# Patient Record
Sex: Female | Born: 1960 | Race: White | Hispanic: No | Smoking: Current some day smoker
Health system: Southern US, Community
[De-identification: ages and names within clinical notes are randomized; demographics above are authoritative.]

## PROBLEM LIST (undated history)

## (undated) DIAGNOSIS — E0590 Thyrotoxicosis, unspecified without thyrotoxic crisis or storm: Secondary | ICD-10-CM

## (undated) DIAGNOSIS — I341 Nonrheumatic mitral (valve) prolapse: Secondary | ICD-10-CM

## (undated) DIAGNOSIS — Z8489 Family history of other specified conditions: Secondary | ICD-10-CM

## (undated) DIAGNOSIS — K759 Inflammatory liver disease, unspecified: Secondary | ICD-10-CM

## (undated) DIAGNOSIS — F909 Attention-deficit hyperactivity disorder, unspecified type: Secondary | ICD-10-CM

## (undated) DIAGNOSIS — E0500 Thyrotoxicosis with diffuse goiter without thyrotoxic crisis or storm: Secondary | ICD-10-CM

## (undated) DIAGNOSIS — K219 Gastro-esophageal reflux disease without esophagitis: Secondary | ICD-10-CM

## (undated) DIAGNOSIS — C801 Malignant (primary) neoplasm, unspecified: Secondary | ICD-10-CM

## (undated) DIAGNOSIS — M1990 Unspecified osteoarthritis, unspecified site: Secondary | ICD-10-CM

## (undated) DIAGNOSIS — H5789 Other specified disorders of eye and adnexa: Secondary | ICD-10-CM

## (undated) DIAGNOSIS — M778 Other enthesopathies, not elsewhere classified: Secondary | ICD-10-CM

## (undated) DIAGNOSIS — F419 Anxiety disorder, unspecified: Secondary | ICD-10-CM

## (undated) DIAGNOSIS — G35 Multiple sclerosis: Secondary | ICD-10-CM

## (undated) DIAGNOSIS — R519 Headache, unspecified: Secondary | ICD-10-CM

## (undated) DIAGNOSIS — R51 Headache: Secondary | ICD-10-CM

## (undated) DIAGNOSIS — N879 Dysplasia of cervix uteri, unspecified: Secondary | ICD-10-CM

## (undated) DIAGNOSIS — J189 Pneumonia, unspecified organism: Secondary | ICD-10-CM

## (undated) HISTORY — DX: Other enthesopathies, not elsewhere classified: M77.8

## (undated) HISTORY — PX: TUBAL LIGATION: SHX77

## (undated) HISTORY — PX: BACK SURGERY: SHX140

## (undated) HISTORY — DX: Nonrheumatic mitral (valve) prolapse: I34.1

## (undated) HISTORY — PX: OTHER SURGICAL HISTORY: SHX169

## (undated) HISTORY — DX: Dysplasia of cervix uteri, unspecified: N87.9

## (undated) HISTORY — DX: Unspecified osteoarthritis, unspecified site: M19.90

## (undated) HISTORY — DX: Thyrotoxicosis with diffuse goiter without thyrotoxic crisis or storm: E05.00

## (undated) HISTORY — DX: Multiple sclerosis: G35

## (undated) HISTORY — DX: Attention-deficit hyperactivity disorder, unspecified type: F90.9

## (undated) HISTORY — DX: Gastro-esophageal reflux disease without esophagitis: K21.9

## (undated) HISTORY — DX: Other specified disorders of eye and adnexa: H57.89

---

## 1997-10-15 ENCOUNTER — Ambulatory Visit: Admission: RE | Admit: 1997-10-15 | Discharge: 1997-10-15 | Payer: Self-pay | Admitting: Obstetrics and Gynecology

## 1998-10-03 ENCOUNTER — Other Ambulatory Visit: Admission: RE | Admit: 1998-10-03 | Discharge: 1998-10-03 | Payer: Self-pay | Admitting: Obstetrics and Gynecology

## 1999-06-02 ENCOUNTER — Ambulatory Visit (HOSPITAL_COMMUNITY): Admission: RE | Admit: 1999-06-02 | Discharge: 1999-06-02 | Payer: Self-pay | Admitting: Neurology

## 1999-09-20 ENCOUNTER — Ambulatory Visit (HOSPITAL_COMMUNITY): Admission: RE | Admit: 1999-09-20 | Discharge: 1999-09-20 | Payer: Self-pay | Admitting: Neurology

## 1999-12-07 ENCOUNTER — Other Ambulatory Visit: Admission: RE | Admit: 1999-12-07 | Discharge: 1999-12-07 | Payer: Self-pay | Admitting: Obstetrics and Gynecology

## 2000-08-08 ENCOUNTER — Observation Stay (HOSPITAL_COMMUNITY): Admission: RE | Admit: 2000-08-08 | Discharge: 2000-08-09 | Payer: Self-pay | Admitting: Obstetrics and Gynecology

## 2000-08-08 ENCOUNTER — Encounter (INDEPENDENT_AMBULATORY_CARE_PROVIDER_SITE_OTHER): Payer: Self-pay | Admitting: *Deleted

## 2000-12-31 ENCOUNTER — Encounter: Payer: Self-pay | Admitting: Neurology

## 2000-12-31 ENCOUNTER — Encounter: Admission: RE | Admit: 2000-12-31 | Discharge: 2000-12-31 | Payer: Self-pay | Admitting: Neurology

## 2001-01-03 ENCOUNTER — Encounter: Admission: RE | Admit: 2001-01-03 | Discharge: 2001-01-03 | Payer: Self-pay | Admitting: Obstetrics and Gynecology

## 2001-01-03 ENCOUNTER — Encounter: Payer: Self-pay | Admitting: Obstetrics and Gynecology

## 2001-01-13 ENCOUNTER — Emergency Department (HOSPITAL_COMMUNITY): Admission: EM | Admit: 2001-01-13 | Discharge: 2001-01-13 | Payer: Self-pay | Admitting: Emergency Medicine

## 2001-08-27 ENCOUNTER — Other Ambulatory Visit: Admission: RE | Admit: 2001-08-27 | Discharge: 2001-08-27 | Payer: Self-pay | Admitting: Obstetrics and Gynecology

## 2001-10-23 ENCOUNTER — Ambulatory Visit (HOSPITAL_COMMUNITY): Admission: RE | Admit: 2001-10-23 | Discharge: 2001-10-23 | Payer: Self-pay | Admitting: Neurosurgery

## 2001-10-23 ENCOUNTER — Encounter: Payer: Self-pay | Admitting: Neurosurgery

## 2002-04-03 ENCOUNTER — Encounter: Payer: Self-pay | Admitting: Obstetrics and Gynecology

## 2002-04-03 ENCOUNTER — Encounter: Admission: RE | Admit: 2002-04-03 | Discharge: 2002-04-03 | Payer: Self-pay | Admitting: Obstetrics and Gynecology

## 2002-05-21 HISTORY — PX: ABDOMINAL HYSTERECTOMY: SHX81

## 2002-10-05 ENCOUNTER — Other Ambulatory Visit: Admission: RE | Admit: 2002-10-05 | Discharge: 2002-10-05 | Payer: Self-pay | Admitting: Obstetrics and Gynecology

## 2003-06-11 ENCOUNTER — Ambulatory Visit (HOSPITAL_COMMUNITY): Admission: RE | Admit: 2003-06-11 | Discharge: 2003-06-11 | Payer: Self-pay | Admitting: Internal Medicine

## 2003-06-18 ENCOUNTER — Ambulatory Visit (HOSPITAL_COMMUNITY): Admission: RE | Admit: 2003-06-18 | Discharge: 2003-06-18 | Payer: Self-pay | Admitting: Internal Medicine

## 2003-12-16 ENCOUNTER — Other Ambulatory Visit: Admission: RE | Admit: 2003-12-16 | Discharge: 2003-12-16 | Payer: Self-pay | Admitting: Obstetrics and Gynecology

## 2004-01-06 ENCOUNTER — Encounter: Admission: RE | Admit: 2004-01-06 | Discharge: 2004-01-06 | Payer: Self-pay | Admitting: Obstetrics and Gynecology

## 2004-02-24 ENCOUNTER — Ambulatory Visit (HOSPITAL_COMMUNITY): Admission: RE | Admit: 2004-02-24 | Discharge: 2004-02-24 | Payer: Self-pay | Admitting: Neurosurgery

## 2004-08-31 ENCOUNTER — Encounter (INDEPENDENT_AMBULATORY_CARE_PROVIDER_SITE_OTHER): Payer: Self-pay | Admitting: *Deleted

## 2004-08-31 ENCOUNTER — Ambulatory Visit (HOSPITAL_COMMUNITY): Admission: RE | Admit: 2004-08-31 | Discharge: 2004-08-31 | Payer: Self-pay | Admitting: Cardiology

## 2005-01-17 ENCOUNTER — Ambulatory Visit (HOSPITAL_COMMUNITY): Payer: Self-pay | Admitting: Psychiatry

## 2005-01-31 ENCOUNTER — Ambulatory Visit (HOSPITAL_COMMUNITY): Payer: Self-pay | Admitting: Psychiatry

## 2005-02-28 ENCOUNTER — Ambulatory Visit (HOSPITAL_COMMUNITY): Payer: Self-pay | Admitting: Psychiatry

## 2005-03-09 ENCOUNTER — Other Ambulatory Visit: Admission: RE | Admit: 2005-03-09 | Discharge: 2005-03-09 | Payer: Self-pay | Admitting: Obstetrics and Gynecology

## 2005-05-30 ENCOUNTER — Ambulatory Visit (HOSPITAL_COMMUNITY): Payer: Self-pay | Admitting: Psychiatry

## 2005-08-14 ENCOUNTER — Ambulatory Visit (HOSPITAL_COMMUNITY): Admission: RE | Admit: 2005-08-14 | Discharge: 2005-08-14 | Payer: Self-pay | Admitting: Neurology

## 2005-08-16 ENCOUNTER — Ambulatory Visit (HOSPITAL_COMMUNITY): Admission: RE | Admit: 2005-08-16 | Discharge: 2005-08-16 | Payer: Self-pay | Admitting: Neurology

## 2005-08-29 ENCOUNTER — Ambulatory Visit (HOSPITAL_COMMUNITY): Payer: Self-pay | Admitting: Psychiatry

## 2005-09-07 ENCOUNTER — Encounter: Admission: RE | Admit: 2005-09-07 | Discharge: 2005-09-07 | Payer: Self-pay | Admitting: Obstetrics and Gynecology

## 2005-11-09 ENCOUNTER — Encounter: Admission: RE | Admit: 2005-11-09 | Discharge: 2005-11-09 | Payer: Self-pay | Admitting: Neurosurgery

## 2005-11-23 ENCOUNTER — Encounter: Admission: RE | Admit: 2005-11-23 | Discharge: 2005-11-23 | Payer: Self-pay | Admitting: Neurosurgery

## 2005-11-27 ENCOUNTER — Inpatient Hospital Stay (HOSPITAL_COMMUNITY): Admission: EM | Admit: 2005-11-27 | Discharge: 2005-11-28 | Payer: Self-pay | Admitting: Emergency Medicine

## 2005-12-19 ENCOUNTER — Ambulatory Visit (HOSPITAL_COMMUNITY): Payer: Self-pay | Admitting: Psychiatry

## 2006-02-12 ENCOUNTER — Encounter: Admission: RE | Admit: 2006-02-12 | Discharge: 2006-03-06 | Payer: Self-pay | Admitting: Neurosurgery

## 2006-04-04 ENCOUNTER — Ambulatory Visit (HOSPITAL_COMMUNITY): Payer: Self-pay | Admitting: Psychiatry

## 2006-07-11 ENCOUNTER — Ambulatory Visit (HOSPITAL_COMMUNITY): Payer: Self-pay | Admitting: Psychiatry

## 2006-07-25 ENCOUNTER — Ambulatory Visit (HOSPITAL_COMMUNITY): Admission: RE | Admit: 2006-07-25 | Discharge: 2006-07-25 | Payer: Self-pay | Admitting: Cardiology

## 2006-07-31 ENCOUNTER — Ambulatory Visit (HOSPITAL_COMMUNITY): Admission: RE | Admit: 2006-07-31 | Discharge: 2006-07-31 | Payer: Self-pay | Admitting: Gastroenterology

## 2006-10-07 ENCOUNTER — Ambulatory Visit (HOSPITAL_COMMUNITY): Payer: Self-pay | Admitting: Psychiatry

## 2006-11-28 ENCOUNTER — Encounter: Admission: RE | Admit: 2006-11-28 | Discharge: 2006-11-28 | Payer: Self-pay | Admitting: Obstetrics and Gynecology

## 2007-01-24 ENCOUNTER — Ambulatory Visit (HOSPITAL_COMMUNITY): Payer: Self-pay | Admitting: Psychiatry

## 2007-06-19 ENCOUNTER — Ambulatory Visit (HOSPITAL_COMMUNITY): Payer: Self-pay | Admitting: Psychiatry

## 2007-09-12 ENCOUNTER — Ambulatory Visit (HOSPITAL_COMMUNITY): Payer: Self-pay | Admitting: Psychiatry

## 2007-10-06 ENCOUNTER — Emergency Department (HOSPITAL_COMMUNITY): Admission: EM | Admit: 2007-10-06 | Discharge: 2007-10-06 | Payer: Self-pay | Admitting: Emergency Medicine

## 2008-01-16 ENCOUNTER — Ambulatory Visit (HOSPITAL_COMMUNITY): Payer: Self-pay | Admitting: Psychiatry

## 2008-04-10 ENCOUNTER — Emergency Department (HOSPITAL_COMMUNITY): Admission: EM | Admit: 2008-04-10 | Discharge: 2008-04-10 | Payer: Self-pay | Admitting: Emergency Medicine

## 2008-04-10 ENCOUNTER — Ambulatory Visit (HOSPITAL_COMMUNITY): Admission: RE | Admit: 2008-04-10 | Discharge: 2008-04-10 | Payer: Self-pay | Admitting: Emergency Medicine

## 2008-04-19 ENCOUNTER — Emergency Department (HOSPITAL_COMMUNITY): Admission: EM | Admit: 2008-04-19 | Discharge: 2008-04-19 | Payer: Self-pay | Admitting: Emergency Medicine

## 2008-05-28 ENCOUNTER — Ambulatory Visit (HOSPITAL_COMMUNITY): Payer: Self-pay | Admitting: Psychiatry

## 2008-06-28 ENCOUNTER — Encounter (INDEPENDENT_AMBULATORY_CARE_PROVIDER_SITE_OTHER): Payer: Self-pay | Admitting: Gastroenterology

## 2008-06-28 ENCOUNTER — Ambulatory Visit (HOSPITAL_COMMUNITY): Admission: RE | Admit: 2008-06-28 | Discharge: 2008-06-28 | Payer: Self-pay | Admitting: Gastroenterology

## 2008-07-15 ENCOUNTER — Encounter: Admission: RE | Admit: 2008-07-15 | Discharge: 2008-07-15 | Payer: Self-pay | Admitting: Obstetrics and Gynecology

## 2008-07-22 ENCOUNTER — Ambulatory Visit (HOSPITAL_COMMUNITY): Admission: RE | Admit: 2008-07-22 | Discharge: 2008-07-22 | Payer: Self-pay | Admitting: Neurology

## 2008-09-07 ENCOUNTER — Ambulatory Visit (HOSPITAL_COMMUNITY): Payer: Self-pay | Admitting: Psychiatry

## 2009-01-28 ENCOUNTER — Ambulatory Visit (HOSPITAL_COMMUNITY): Payer: Self-pay | Admitting: Psychiatry

## 2009-07-01 ENCOUNTER — Ambulatory Visit (HOSPITAL_COMMUNITY): Payer: Self-pay | Admitting: Psychiatry

## 2010-01-27 ENCOUNTER — Ambulatory Visit (HOSPITAL_COMMUNITY): Payer: Self-pay | Admitting: Psychiatry

## 2010-04-21 ENCOUNTER — Ambulatory Visit (HOSPITAL_COMMUNITY): Payer: Self-pay | Admitting: Psychiatry

## 2010-04-28 ENCOUNTER — Ambulatory Visit (HOSPITAL_COMMUNITY): Payer: Self-pay | Admitting: Psychiatry

## 2010-06-10 ENCOUNTER — Encounter: Payer: Self-pay | Admitting: Neurosurgery

## 2010-06-11 ENCOUNTER — Encounter: Payer: Self-pay | Admitting: Neurology

## 2010-06-11 ENCOUNTER — Encounter: Payer: Self-pay | Admitting: Neurosurgery

## 2010-07-21 ENCOUNTER — Encounter (HOSPITAL_COMMUNITY): Payer: Commercial Managed Care - PPO | Admitting: Physician Assistant

## 2010-07-21 DIAGNOSIS — F988 Other specified behavioral and emotional disorders with onset usually occurring in childhood and adolescence: Secondary | ICD-10-CM

## 2010-10-03 NOTE — Op Note (Signed)
NAMEANYELY, CUNNING                ACCOUNT NO.:  [id number]   MEDICAL RECORD NO.:  [id number]          PATIENT TYPE:  AMB   LOCATION:  ENDO                         FACILITY:  [facility]   PHYSICIAN:  Petra Kuba, M.D.    DATE OF BIRTH:  [birth year]   DATE OF PROCEDURE:  06/28/2008  DATE OF DISCHARGE:                               OPERATIVE REPORT   PROCEDURE:  Esophagogastroduodenoscopy with biopsy.   INDICATION:  Midepigastric abdominal pain, some atypical chest pain and  nausea.  Consent was signed after risks, benefits, methods, and options  thoroughly discussed multiple times in the past.   MEDICINES USED:  1. Fentanyl 75 mcg.  2. Versed 7 mg.   PROCEDURE:  The video endoscope was inserted by direct vision.  The  esophagus was normal.  She did have a tiny hiatal hernia.  Scope passed  into the stomach and advanced through a normal pylorus, into a normal  duodenal bulb and around the C-loop to a normal second and probably  third and fourth part of the duodenum.  The scope was withdrawn back to  the bulb and a good look there ruled out abnormalities in that location.  Scope was withdrawn back to the stomach.  She did have a mild amount of  antritis.  Two biopsies were obtained and 2 of the proximal stomach to  rule out Helicobacter.  On retroflexion, the cardia, fundus, angularis,  lesser and greater curves were all normal except for a tiny polyp in the  cardia which was cold biopsied x2.  Straight visualization of the  stomach did not reveal any additional findings.  Air was suctioned.  The  scope was slowly withdrawn.  Again, a good look at the esophagus was  normal.  Quick look at the vocal cords were normal.  Scope was removed.  The patient tolerated the procedure well.  There was no obvious  immediate complication.   ENDOSCOPIC DIAGNOSES:  1. Tiny hiatal hernia without signs of esophagitis.  2. Minimal antritis, status post biopsy.  3. Tiny proximal stomach polyp, status  post biopsy and proximal      stomach biopsy to rule out Helicobacter.  4. Otherwise within normal limits to the third or fourth part of the      duodenum.   PLAN:  Continue Protonix, seems to be helping.  Await pathology.  Follow  up p.r.n. or in 1 month to recheck symptoms and make sure no further  workup plans are needed like possibly rechecking her gallbladder, CCK,  PIPIDA, etc.           ______________________________  Petra Kuba, M.D.     MEM/MEDQ  D:  06/28/2008  T:  06/29/2008  Job:  [id number]

## 2010-10-06 NOTE — Op Note (Signed)
[facility]  Patient:    [name], [name]                       MRN: [id number] Proc. Date: 08/08/00 Adm. Date:  [id number] Attending:  Cordelia Pen Ii                           Operative Report  PREOPERATIVE DIAGNOSIS:       Menorrhagia.  POSTOPERATIVE DIAGNOSIS:      Menorrhagia.  OPERATION:                    Total vaginal hysterectomy.  SURGEON:                      Guy Sandifer. Arleta Creek, M.D.  ASSISTANT:                    Raynald Kemp, M.D.  ANESTHESIA:                   General endotracheal anesthesia.  ESTIMATED BLOOD LOSS:         100 cc.  INDICATIONS:                  This patient is a 50 year old married white female with menorrhagia.  Details are dictated in the history and physical. Total vaginal hysterectomy is discussed with the patient.  Removal of ovaries unless distinctly abnormal was discussed.  Potential risks and complications have been discussed including, but not limited to, infection, bowel, bladder, or ureteral damage, bleeding requiring transfusion of blood products, and possible transfusion with reaction to HIV and hepatitis acquisition, DVT, PE, pneumonia, fistula formation, laparotomy, and postoperative dyspareunia.  All questions were answered and consent is signed on the chart.  DESCRIPTION OF PROCEDURE:     The patient is taken to the operating room, placed in the dorsal lithotomy position where general anesthesia is induced via endotracheal intubation.  She was then placed in the dorsal lithotomy position where she is prepped, bladder straight catheterized, and she is draped in a sterile fashion.  Weighted speculum was placed.  The posterior cul-de-sac was entered sharply without difficulty.  The cervix is circumscribed with a scalpel and the vaginal mucosa is advanced bluntly and sharply.  Uterosacral ligaments then taken down bilaterally.  This was done using the LigaSure instrument.  The bladder  pillars were then taken down bilaterally.  The anterior cul-de-sac is then entered without difficulty.  The cardinal ligaments were then taken bilaterally.  The uterine vessels were taken bilaterally using a double burn technique with a LigaSure.  Two bites of uterine vessels are taken bilaterally.  Fundus is then delivered posteriorly.  The proximal ligaments were then taken again using a double burn technique with the LigaSure. Specimen is delivered.  Inspection reveals some bleeding from the base of the proximal ligaments on the left and the right side.  These are visualized and ligated with 0 Monocryl suture.  The uterosacral ligaments are then plicated in the vagina bilaterally using 0 Monocryl and then ligated in the midline with a single 0 Monocryl suture.  Cuff was then closed with a figure-of-eight Monocryl sutures.  Foley catheter was placed in the bladder and clear urine is noted.  All counts are correct.  The patient is awakened and taken to the recovery room in stable condition. DD:  08/08/00 TD:  08/08/00 Job: [id number]

## 2010-10-06 NOTE — Op Note (Signed)
NAMEJANAIA, [name]                ACCOUNT NO.:  [id number]   MEDICAL RECORD NO.:  [id number]          PATIENT TYPE:  INP   LOCATION:  6729                         FACILITY:  [facility]   PHYSICIAN:  Clydene Fake, M.D.  DATE OF BIRTH:  06-26-1960   DATE OF PROCEDURE:  11/27/2005  DATE OF DISCHARGE:  11/28/2005                                 OPERATIVE REPORT   PREOPERATIVE DIAGNOSIS:  Herniated nucleus pulposus right L5-S1,  spondylosis.   POSTOPERATIVE DIAGNOSIS:  Herniated nucleus pulposus right L5-S1,  spondylosis.   PROCEDURE:  Right L5-S1 semi-hemilaminectomy and discectomy, microscopic  surgical procedure.   SURGEON:  Clydene Fake, M.D.   ASSISTANT:  Hilda Lias, M.D.   ANESTHESIA:  General endotracheal anesthesia.   ESTIMATED BLOOD LOSS:  Minimal.   BLOOD GIVEN:  None.   DRAINS:  None.   COMPLICATIONS:  None.   REASON FOR PROCEDURE:  The patient is a 50 year old woman who has had some  intermittent back pain over the years, presented with 3-1/2 weeks of severe  right leg pain with some numbness, tingling, epidural injections have  helped, but did not last and today pain became even more severe.  The  patient presented to the emergency room where a new MRI was done showing an  extruded, very large disc herniation, right sided L5-S1 with extrusion  causing compression of the S1 root.  The patient was brought in for  diskectomy.   PROCEDURE IN DETAIL:  The patient was brought into the operating room and  general anesthesia induced.  The patient was placed in the prone position on  the Wilson frame with all pressure points padded.  The patient was prepped  and draped in a sterile fashion.  Site of the incision  was then injected  with 10 cc of 1% lidocaine with epinephrine.  Incision was then made in the  midline lower lumbar spine, incision taken down to the fascia with  hemostasis obtained with Bovie cauterization.  The fascia was incised and  subperiosteal  dissection was done over the two spinous processes.  A marker  was placed.  X-rays were obtained assuring [place] that this was the 4-5 space.  We dissected inferiorly one level with marker in the interspace.  AP and  lateral  x-rays showed this is the 5-1 interspace.  Microscope was brought  in for microdissection.  At this point a self-retaining retractor was  placed.  A semi-hemilaminectomy was performed with high speed drill and  Kerrison punches.  Ligamentum flavum was removed with Kerrison punches.  The  foraminotomy was done over the S1 root and explored the epidural space and  found a very large disc protrusion.  The posterior aspect of this was an  annular tear and extruded fragment.  We removed the free fragments of disc  and decompressed the nerve root.  There  was still significant disc bulge  and we incised the disc space and continued with discectomy with pituitary  rongeurs and curets.  We used Epstein curets to push the disc material back  down in the disc space and  then removed it.  When we were finished we  irrigated and had central and lateral decompression of the canal and very  good decompression of the S1 root.  Hemostasis was obtained with bipolar  cauterization, Gelfoam with thrombin.  Gelfoam was irrigated out from the  area with antibiotics solution  maintaining good hemostasis.  Again a good  decompression of both the L5 and S1 roots and the central canal.  Retractors  were removed.  Fascia was closed with 0 Vicryl interrupted sutures,  subcutaneous tissue closed with 2-0 and 3-0 Vicryl interrupted sutures.  Skin closed with Benzoin and Steri-Strips.  Dressing placed.  The patient  was placed back in the supine position, reversed from anesthesia and  transferred to the recovery room in stable condition.           ______________________________  Clydene Fake, M.D.     JRH/MEDQ  D:  11/27/2005  T:  11/28/2005  Job:  [id number]

## 2010-10-06 NOTE — Discharge Summary (Signed)
Danielle, [name]                          ACCOUNT NO.:  [id number]   MEDICAL RECORD NO.:  [id number]                   PATIENT TYPE:  OIB   LOCATION:  3702                                 FACILITY:  [facility]   PHYSICIAN:  Duke Salvia, M.D.               DATE OF BIRTH:  06-Jan-1961   DATE OF ADMISSION:  06/18/2003  DATE OF DISCHARGE:  06/18/2003                                 DISCHARGE SUMMARY   PRIMARY DIAGNOSIS:  Supraventricular tachycardia.   HISTORY OF PRESENT ILLNESS:  This is a 50 year old female with a 10 year  history of repeated abrupt onset-offset of tachy-palpitations associated  with shortness of breath, fatigue, chest discomfort.  She does not have  lightheadedness, presyncope, or syncope.  Started 10 years ago.  They  occurred a couple times a month, lasting 10-15 minutes.  Over the last 6-8  months, there has been a marked increase in frequency, so they are happening  daily, lasting hours and resulting in profound postepisode fatigue.  She was  started on atenolol in conjunction with digoxin, and this has been  associated with significant improvement, although not abolition of her  episodes.  Patient was admitted, and patient's secondary diagnoses are  history of mitral valve prolapse, SVT, and migraine headaches.  She was  admitted and underwent a flow pathway modification of AV nodal reentrant  tachycardia.  She tolerated the procedure well, had no immediate postop  complications, and was later discharged that evening to home in stable  condition.   DISCHARGE MEDICATIONS:  1. Tenormin 25 mg daily.  2. Prozac 20 daily.  3. Betaseron as before.  4. She was to stop her digoxin.  5. Coated aspirin 325 mg daily x 6 weeks.  6. Antibiotic prophylaxis for next 3 months.  7. Tylenol 1-2 tabs q.4-6h. p.r.n. pain.   No heavy lifting or strenuous activity for four days, no driving for two  days.  Low-fat, low-salt, low-cholesterol diet.  She was to call if she  developed any lump or drainage in her groin.  She was to follow with [name], at [facility], March 2, at 2:30 p.m.      [name], C.R.N.P. [facility]                 Duke Salvia, M.D.    DS/MEDQ  D:  06/18/2003  T:  06/19/2003  Job:  [id number]   cc:   Duke Salvia, M.D.   Thereasa Solo. Little, M.D.  [address]  [address]  [place]  [place] [address]  Fax: [phone]

## 2010-10-06 NOTE — Discharge Summary (Signed)
[facility]  Patient:    [name], [name]                       MRN: [id number] Adm. Date:  [id number] Disc. Date: [id number] Attending:  Cordelia Pen Ii                           Discharge Summary  ADMISSION DIAGNOSIS:          Menorrhagia.  DISCHARGE DIAGNOSIS:          Menorrhagia.  PROCEDURE:                    On August 08, 2000, total vaginal hysterectomy.  REASON FOR ADMISSION:         The patient is a 50 year old married white female, G3, P2, AB 1, status post tubal ligation with heavy vaginal bleeding. Details dictated in History and Physical.  Admitted for surgical therapy.  HOSPITAL COURSE:              The patient was admitted for the above procedure which passed without complication.  Estimated blood loss was 100 cc.  On the day of discharge, the patient is passing flatus, tolerating regular diet, ambulating well with good pain relief.  Vital signs are stable.  She remains afebrile.  Abdomen is soft with good bowel sounds.  Extremities nontender.  LABORATORY DATA:              Hemoglobin 11.2, white count 10.1, platelet count 242,200.  Pathology pending.  CONDITION ON DISCHARGE:       Good.  DISCHARGE INSTRUCTIONS:       Diet:  Regular as tolerated.  Activity:  No lifting, no operation of automobiles, no vaginal entry.  DISCHARGE FOLLOWUP:           She is to follow up in the office in two weeks. She is to call for problems including, but not limited to, temperature over 100 degrees, increasing pain, heavy vaginal bleeding, or persistent nausea/vomiting.  DISCHARGE MEDICATIONS:        1. Tylox #30 one to two p.o. q.6h. p.r.n.                               2. Ibuprofen 600 mg p.o. q.6h. p.r.n.                               3. Multivitamin q.d. DD:  08/09/00 TD:  08/11/00 Job: [id number]

## 2010-10-06 NOTE — Op Note (Signed)
[name], [name]                          ACCOUNT NO.:  [id number]   MEDICAL RECORD NO.:  [id number]                   PATIENT TYPE:  OIB   LOCATION:  3702                                 FACILITY:  [facility]   PHYSICIAN:  Duke Salvia, M.D.               DATE OF BIRTH:  08-Sep-1960   DATE OF PROCEDURE:  06/18/2003  DATE OF DISCHARGE:  06/18/2003                                 OPERATIVE REPORT   PREOPERATIVE DIAGNOSIS:  Ventricular tachycardia.   POSTOPERATIVE DIAGNOSIS:  AV node re-entry tachycardia.   OPERATION PERFORMED:  Basic electrophysiologic study with isoproterenol  infusion and arrhythmia mapping with radio frequency catheter ablation.   SURGEON:  Duke Salvia, M.D.   DESCRIPTION OF PROCEDURE:  Following the obtaining of informed consent, the  patient was brought to the electrophysiology laboratory and placed on the  fluoroscopic table in supine position.  After routine prep and drape,  cardiac catheterization was performed with local anesthesia and conscious  sedation.  Noninvasive blood pressure monitoring and transcutaneous oxygen  saturation monitoring and end tidal CO2 monitoring was performed  continuously throughout the procedure.  Following the procedure, the  catheters were removed, hemostasis was obtained.  The patient was  transferred to the floor in stable condition.   CATHETERS:  A 5 French quadripolar catheter was inserted via left femoral  vein to the high right atrium.  A 5 French quadripolar catheter was inserted  via the left femoral vein to the AV junction to measure His bundle  electrogram.  A 5 French quadripolar catheter was inserted via the left  femoral vein to the right ventricular apex.  A 6 French octapolar catheter  was inserted via the right femoral vein to the coronary sinus.  A 7 French 4  mm deflectable tip catheter was inserted via the right femoral vein to  mapping sites in the posterior septal space.   Surface leads 1, aVF,  and V1 were monitored continuously throughout the  procedure.  Following insertion of the catheters, stimulation protocol  included incremental atrial pacing, incremental ventricular pacing, single  and double ventricular extrastimuli at pace cycle length of 600 and 400  msec.  Single ventricular extrastimuli.   RESULTS:  a.  Surface electrocardiogram basic measurements.  Rhythm sinus  initial and sinus final.  Cycle length is 969 msec initial and 576 msec  final (on isoproterenol.  PR interval is 137 msec initial and 127 msec  final.  QRS duration is 78 msec initial and 95 msec final.  QT interval is  387 msec initial and 355 msec final.  Q wave duration is 77 msec initial and  95 msec final.  Bundle branch block is absent and absent.  Pre-excitation is  absent and absent.  AH interval is 77 msec initial and 74 msec final.  HV  interval is 56 msec initial and 39 msec final.  b.  AV nodal  function.  The AV nodal Wenckebach cycle length was 450 msec  with VA Wenckebach less than 450 msec.  AV nodal conduction pre ablation was  discontinuous with echo beats and postablation was associated with isolated  echo beats.  c.  Accessory pathway function.  No evidence of an accessory pathway was  identified.  d.  Arrhythmias induced.  AV nodal re-entry was reproducibly induced  initially during catheter insertion and then with ventricular pacing at 450  and single atrial  extrastimuli at a pace cycle length at 600 msec.  It was  terminated reproducibly with ventricular pacing.   Characteristics of the tachycardia included the earliest atrial electrogram  was in the His bundle measurement, no atrial pre-excitation was seen with  ventricular stimulation administered during His bundle refractoriness.   Typical intervals during tachycardia included an AH interval of 443 msec, an  HA interval of 39 msec, a VA time of -4 msec and a V to high right atrial  time of 48 msec.   FLUOROSCOPY TIME:  A  total of six minutes of fluoroscopy time was utilized  at 7.5 frames per second.   RADIO FREQUENCY ENERGY:  A total of 41 seconds of radio frequency energy was  applied in two applications, the second of which was associated with  junctional rhythm.  Following the second application, tachycardia was no  longer inducible in the absence or in the presence of isoproterenol.   IMPRESSION:  1. Normal sinus function.  2. Normal atrial function.  3. Dual AV nodal physiology with inducible slow:fast AV nodal re-entry     tachycardia.  RF applied to the posterior septal space and presumed slow     pathway potential has eliminated inducibility of the patient's     tachycardia.  4. Normal His-Purkinje system function.  5. No accessory pathway.  6. Normal ventricular response to programmed stimulation.   SUMMARY:  In conclusion, results of electrophysiologic testing demonstrated  slow-fast AV nodal re-entry tachycardia as the mechanism underlying the  patient's clinical arrhythmia.  Radiofrequency modification of the slow  pathway rendered the patient noninducible.   RECOMMENDATIONS:  The patient was observed, was put on aspirin and  endocarditis prophylaxis for six weeks.   Thereafter, a suture was placed on the superolateral aspect of the pocket  and then moved across the device into the medial aspect of the pocket and  then tied with one knot laterally.  A Medtronic Reveal Plus 6191 loop  recorder was used.  Serial number [id number].  The pocket was copiously  irrigated with antibiotic containing saline solution.  The loop recorder was  secured.  The wound was washed, dried and a Benzoin and Steri-Strip dressing  was applied.  Sponge, needle and instrument counts were correct at the end  of the procedure according to staff.  The patient tolerated the procedure  without apparent complication.                                               Duke Salvia, M.D.    SCK/MEDQ  D:   08/10/2003  T:  08/12/2003  Job:  [id number]   cc:   Thereasa Solo. Little, M.D.  [address]  [address]  [place]  [place] [address]  Fax: [phone]

## 2010-10-06 NOTE — H&P (Signed)
[facility]  Patient:    [name], WIBLE                         MRN: [id number] Adm. Date:  08/08/00 Attending:  Guy Sandifer. Arleta Creek, M.D.                         History and Physical  CHIEF COMPLAINT:              Heavy vaginal bleeding.  HISTORY OF PRESENT ILLNESS:   This patient is a 50 year old married white female, G3, P2, AB 1, status post tubal ligation, who continues to have extremely heavy vaginal bleeding with her menses.  She is status post hysteroscopy, D&C in 1998, with negative findings at that time.  Daily birth control pills have also not been successful.  After a careful discussion of the options, she is being admitted for total vaginal hysterectomy.  One or both ovaries will be removed only if they are distinctly abnormal.  PAST MEDICAL HISTORY:         1. 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                               2. Mitral valve prolapse.                               3. History of situational depression.                               4. History of gastric ulcers.                               5. Multiple sclerosis.                               6. 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                               7. Status post broken kneecap 1988.  PAST SURGICAL HISTORY:        1. D&C 1998.                               2. Aspiration of benign breast cyst 1996.                               3. Laparoscopy with application of Filshie clips                                  and hysteroscopy, D&C in 1998.  4. Cyst removed from right forearm 1985.  MEDICATIONS:                  1. Betaseron every other day.                               2. Prozac 20 mg daily.                               3. Lanoxin 0.125 mg daily.                               4. Solu-Medrol, last dose several weeks ago.                               5. Lo/Ovral  daily.  ALLERGIES:                    KEFLEX leading to HIVES.  SOCIAL HISTORY:               Smokes five cigarettes a day.  Denies alcohol or drug abuse.  OBSTETRICAL HISTORY:          Vaginal delivery x 2.  REVIEW OF SYSTEMS:            Negative except as above.  PHYSICAL EXAMINATION:  VITAL SIGNS:                  Height 5 feet 7 inches.  HEENT:                        Without thyromegaly.  LUNGS:                        Clear to auscultation.  HEART:                        Regular rate and rhythm.  BACK:                         Without CVA tenderness.  BREASTS:                      Without masses or discharge.  ABDOMEN:                      Soft and nontender, without masses.  PELVIC:                       Vulva, vagina, cervix without lesion.  Uterus is six weeks in size, with first-degree prolapse.  Adnexa nontender, without masses.  EXTREMITIES:                  Grossly within normal limits.  NEUROLOGIC:                   Grossly within normal limits.  ASSESSMENT:                   Menorrhagia.  PLAN:                         Total vaginal hysterectomy. DD:  08/03/00  TD:  08/03/00 Job: [id number]

## 2010-10-19 ENCOUNTER — Encounter (HOSPITAL_COMMUNITY): Payer: Commercial Managed Care - PPO | Admitting: Physician Assistant

## 2010-10-19 DIAGNOSIS — F988 Other specified behavioral and emotional disorders with onset usually occurring in childhood and adolescence: Secondary | ICD-10-CM

## 2010-10-20 ENCOUNTER — Encounter (HOSPITAL_COMMUNITY): Payer: Commercial Managed Care - PPO | Admitting: Physician Assistant

## 2010-10-24 ENCOUNTER — Encounter (HOSPITAL_COMMUNITY): Payer: Commercial Managed Care - PPO | Admitting: Physician Assistant

## 2011-01-25 ENCOUNTER — Encounter (HOSPITAL_COMMUNITY): Payer: Commercial Managed Care - PPO | Admitting: Physician Assistant

## 2011-01-25 DIAGNOSIS — F988 Other specified behavioral and emotional disorders with onset usually occurring in childhood and adolescence: Secondary | ICD-10-CM

## 2011-02-20 LAB — POCT URINALYSIS DIP (DEVICE)
Glucose, UA: NEGATIVE mg/dL
Hgb urine dipstick: NEGATIVE
Ketones, ur: NEGATIVE mg/dL
Nitrite: NEGATIVE
Protein, ur: 30 mg/dL — AB
Specific Gravity, Urine: 1.02 (ref 1.005–1.030)
Urobilinogen, UA: 0.2 mg/dL (ref 0.0–1.0)
pH: 6 (ref 5.0–8.0)

## 2011-04-26 ENCOUNTER — Ambulatory Visit (HOSPITAL_COMMUNITY): Payer: Commercial Managed Care - PPO | Admitting: Physician Assistant

## 2011-04-26 DIAGNOSIS — F988 Other specified behavioral and emotional disorders with onset usually occurring in childhood and adolescence: Secondary | ICD-10-CM

## 2011-04-26 MED ORDER — LISDEXAMFETAMINE DIMESYLATE 40 MG PO CAPS: 40.0000 mg | ORAL_CAPSULE | ORAL | Status: DC

## 2011-04-30 ENCOUNTER — Other Ambulatory Visit (HOSPITAL_COMMUNITY): Payer: Self-pay | Admitting: *Deleted

## 2011-04-30 DIAGNOSIS — F902 Attention-deficit hyperactivity disorder, combined type: Secondary | ICD-10-CM

## 2011-04-30 MED ORDER — LISDEXAMFETAMINE DIMESYLATE 40 MG PO CAPS: 40.0000 mg | ORAL_CAPSULE | ORAL | Status: DC

## 2011-07-31 ENCOUNTER — Ambulatory Visit (HOSPITAL_COMMUNITY): Payer: Commercial Managed Care - PPO | Admitting: Physician Assistant

## 2011-08-08 ENCOUNTER — Other Ambulatory Visit: Payer: Self-pay | Admitting: Obstetrics and Gynecology

## 2011-08-29 ENCOUNTER — Other Ambulatory Visit (HOSPITAL_COMMUNITY): Payer: Self-pay | Admitting: *Deleted

## 2011-08-29 ENCOUNTER — Ambulatory Visit (INDEPENDENT_AMBULATORY_CARE_PROVIDER_SITE_OTHER): Payer: Commercial Managed Care - PPO | Admitting: Physician Assistant

## 2011-08-29 DIAGNOSIS — F902 Attention-deficit hyperactivity disorder, combined type: Secondary | ICD-10-CM

## 2011-08-29 DIAGNOSIS — G35 Multiple sclerosis: Secondary | ICD-10-CM

## 2011-08-29 DIAGNOSIS — F909 Attention-deficit hyperactivity disorder, unspecified type: Secondary | ICD-10-CM

## 2011-08-29 MED ORDER — LISDEXAMFETAMINE DIMESYLATE 40 MG PO CAPS: 40.0000 mg | ORAL_CAPSULE | ORAL | Status: DC

## 2011-08-29 MED ORDER — LISDEXAMFETAMINE DIMESYLATE 50 MG PO CAPS: 40.0000 mg | ORAL_CAPSULE | ORAL | Status: DC

## 2011-09-03 ENCOUNTER — Other Ambulatory Visit (HOSPITAL_COMMUNITY): Payer: Self-pay | Admitting: *Deleted

## 2011-09-03 NOTE — Telephone Encounter (Signed)
Prior authorization requested from pharmacy prior to filling [email or web address] to be faxed

## 2011-09-04 DIAGNOSIS — G35 Multiple sclerosis: Secondary | ICD-10-CM | POA: Insufficient documentation

## 2011-09-04 NOTE — Progress Notes (Signed)
   [facility] Follow-up Outpatient Visit  ZEFFIE BICKERT 02-03-1961  Date: 08/29/2011   Subjective: Harvin Hazel presents today to followup on medications prescribed for ADHD. She reports that her coworkers are asking her if she has taken her medications on occasions. She is uncertain, but feels that the Vyvanse may need to be increased. Otherwise she is doing well. She is sleeping and eating well. She denies any suicidal or homicidal ideation. She denies any auditory or visual hallucinations.  There were no vitals filed for this visit.  Mental Status Examination  Appearance: Well groomed and casually dressed Alert: Yes Attention: good  Cooperative: Yes Eye Contact: Good Speech: Clear and even Psychomotor Activity: Normal Memory/Concentration: Intact Oriented: person, place, time/date and situation Mood: Euthymic Affect: Appropriate Thought Processes and Associations: Linear Fund of Knowledge: Good Thought Content: Normal Insight: Good Judgement: Good  Diagnosis: ADHD, inattentive type  Treatment Plan: We will increase her Vyvanse to 50 mg daily and she will followup in 3 months. She is encouraged to call if necessary  Thera Basden, PA-C

## 2011-09-07 ENCOUNTER — Encounter (HOSPITAL_COMMUNITY): Payer: Self-pay | Admitting: *Deleted

## 2011-09-07 NOTE — Progress Notes (Signed)
Notified by [employer]: Vyvanse 50 mg authorized 09/06/11 to 09/05/12

## 2011-09-10 ENCOUNTER — Ambulatory Visit (INDEPENDENT_AMBULATORY_CARE_PROVIDER_SITE_OTHER): Payer: Commercial Managed Care - PPO | Admitting: Family Medicine

## 2011-09-10 ENCOUNTER — Encounter: Payer: Self-pay | Admitting: Family Medicine

## 2011-09-10 VITALS — BP 116/79 | HR 76 | Temp 97.9°F | Ht 67.0 in | Wt 183.5 lb

## 2011-09-10 DIAGNOSIS — Z0000 Encounter for general adult medical examination without abnormal findings: Secondary | ICD-10-CM | POA: Insufficient documentation

## 2011-09-10 DIAGNOSIS — F17200 Nicotine dependence, unspecified, uncomplicated: Secondary | ICD-10-CM

## 2011-09-10 DIAGNOSIS — G35 Multiple sclerosis: Secondary | ICD-10-CM

## 2011-09-10 DIAGNOSIS — Z720 Tobacco use: Secondary | ICD-10-CM | POA: Insufficient documentation

## 2011-09-10 NOTE — Assessment & Plan Note (Deleted)
Advised on health maintenance for MS, relative immunocompromise on medications.  Advised to speak to her neurologist about pneumovax.  Recommended sunscreen, skin check given signs of sun damage.

## 2011-09-10 NOTE — Assessment & Plan Note (Signed)
Counseled on cessation, offered counseling when she feels ready

## 2011-09-10 NOTE — Patient Instructions (Signed)
You will get records from your neurologist (fasting blood work) and last office Results of DEXA and mammogram Think about pneumonia vaccine and MS- ask your neurologist  Follow-up annually- sooner if needed.

## 2011-09-10 NOTE — Progress Notes (Signed)
  Subjective:    Patient ID: [name], female    DOB: November 12, 1960, 51 y.o.   MRN: [id number]  HPI Here to establish PCP   Patient Information Form: Screening and ROS  AUDIT-C Score: 2 Do you feel safe in relationships? yes PHQ-2:negative  Review of Symptoms  General:  Negative for nexplained weight loss, fever Skin: Negative for new or changing mole, sore that won't heal HEENT: Negative for trouble hearing, trouble seeing, ringing in ears, mouth sores, hoarseness, change in voice, dysphagia. CV:  Negative for chest pain, dyspnea, edema, palpitations Resp: Negative for cough, dyspnea, hemoptysis GI: Negative for nausea, vomiting, diarrhea, constipation, abdominal pain, melena, hematochezia. GU: Negative for dysuria, incontinence, urinary hesitance, hematuria, vaginal or penile discharge, polyuria, sexual difficulty, lumps in testicle or breasts MSK: Negative for muscle cramps or aches, joint pain or swelling Neuro: Negative for headaches, weakness, numbness, dizziness, passing out/fainting Psych: Negative for depression, anxiety, memory problems     Review of Systemssee HPI    Objective:   Physical Exam GEN: Alert & Oriented, No acute distress HEENT: [place]/AT. EOMI, PERRLA, no conjunctival injection or scleral icterus.  Bilateral tympanic membranes intact without erythema or effusion.  .  Nares without edema or rhinorrhea.  Oropharynx is without erythema or exudates.  No anterior or posterior cervical lymphadenopathy. CV:  Regular Rate & Rhythm, no murmur Respiratory:  Normal work of breathing, CTAB Abd:  + BS, soft, no tenderness to palpation Ext: no pre-tibial edema Skin: evidence of sun damage       Assessment & Plan:

## 2011-09-10 NOTE — Assessment & Plan Note (Signed)
vised on health maintenance for MS, relative immunocompromise on medications.  Advised to speak to her neurologist about pneumovax.  Recommended sunscreen, skin check given signs of sun damage.  Planning on colonoscopy with Dr. Ewing Schlein Had Pap, Mammo and DEXA this year with OBGYN Had TDAP in past 10 years Had fasting labs with neuro- will get records for me

## 2011-09-14 ENCOUNTER — Encounter: Payer: Self-pay | Admitting: Family Medicine

## 2011-12-05 ENCOUNTER — Ambulatory Visit (INDEPENDENT_AMBULATORY_CARE_PROVIDER_SITE_OTHER): Payer: Commercial Managed Care - PPO | Admitting: Physician Assistant

## 2011-12-05 DIAGNOSIS — F988 Other specified behavioral and emotional disorders with onset usually occurring in childhood and adolescence: Secondary | ICD-10-CM

## 2011-12-05 NOTE — Progress Notes (Signed)
   [facility] Behavioral Health Follow-up Outpatient Visit  [name] 1961/03/09  Date: 12/05/2011   Subjective: [name] presents today to followup on her treatment for ADHD. At her last visit we increased her Vyvanse to 50 mg because there were days when people questioned whether she had taken her medication or not. She reports that she has not seen a big difference since we increased her medication. She is willing to increase it more to see if any benefit can be seen. She denies any side effects to her current dose.  There were no vitals filed for this visit.  Mental Status Examination  Appearance: Well groomed and casually dressed Alert: Yes Attention: good  Cooperative: Yes Eye Contact: Good Speech: Clear and coherent Psychomotor Activity: Normal Memory/Concentration: Intact Oriented: person, place, time/date and situation Mood: Euthymic Affect: Appropriate Thought Processes and Associations: Linear Fund of Knowledge: Good Thought Content: Normal Insight: Good Judgement: Good  Diagnosis: ADHD, inattentive type  Treatment Plan: We will increase her Vyvanse to 60 mg and she will return for followup in 3 months.  Trystin Terhune, PA-C

## 2011-12-06 ENCOUNTER — Other Ambulatory Visit (HOSPITAL_COMMUNITY): Payer: Self-pay | Admitting: *Deleted

## 2011-12-06 MED ORDER — LISDEXAMFETAMINE DIMESYLATE 60 MG PO CAPS: 60.0000 mg | ORAL_CAPSULE | ORAL | Status: DC

## 2012-03-06 ENCOUNTER — Ambulatory Visit (INDEPENDENT_AMBULATORY_CARE_PROVIDER_SITE_OTHER): Payer: Commercial Managed Care - PPO | Admitting: Physician Assistant

## 2012-03-06 ENCOUNTER — Other Ambulatory Visit (HOSPITAL_COMMUNITY): Payer: Self-pay | Admitting: *Deleted

## 2012-03-06 DIAGNOSIS — F988 Other specified behavioral and emotional disorders with onset usually occurring in childhood and adolescence: Secondary | ICD-10-CM

## 2012-03-06 MED ORDER — ALPRAZOLAM 0.5 MG PO TABS: 0.5000 mg | ORAL_TABLET | Freq: Every evening | ORAL | Status: DC | PRN

## 2012-03-06 MED ORDER — LISDEXAMFETAMINE DIMESYLATE 60 MG PO CAPS: 60.0000 mg | ORAL_CAPSULE | ORAL | Status: DC

## 2012-03-10 DIAGNOSIS — F988 Other specified behavioral and emotional disorders with onset usually occurring in childhood and adolescence: Secondary | ICD-10-CM | POA: Insufficient documentation

## 2012-03-10 NOTE — Progress Notes (Signed)
   [facility] Behavioral Health Follow-up Outpatient Visit  [name] 1960-11-14  Date: 03/06/2012   Subjective: [name] presents today to followup on her treatment for ADHD. She reports that everything is going well with her. She does describe some conflicts in her personal life with the family of her boyfriend. She asked about advice or suggestions on how to handle that situation. Otherwise she reports that the Vyvanse continues to work well for her and she wants to continue it. She also asked for a refill on her Xanax, which she takes on an occasional basis. Overall, she reports her mood has been stable. She denies any sleep or appetite problems. She denies any suicidal or homicidal ideation. She denies any auditory or visual hallucinations.  There were no vitals filed for this visit.  Mental Status Examination  Appearance: Well groomed and casually dressed Alert: Yes Attention: good  Cooperative: Yes Eye Contact: Good Speech: Clear and coherent Psychomotor Activity: Normal Memory/Concentration: Intact Oriented: person, place, time/date and situation Mood: Euthymic Affect: Congruent Thought Processes and Associations: Linear Fund of Knowledge: Good Thought Content: Normal Insight: Good Judgement: Good  Diagnosis: ADHD, inattentive type  Treatment Plan: We will continue her Vyvanse at 60 mg daily, and she will return for followup in 3 months.  Obinna Ehresman, PA-C

## 2012-03-17 ENCOUNTER — Telehealth (HOSPITAL_COMMUNITY): Payer: Self-pay | Admitting: Physician Assistant

## 2012-03-17 NOTE — Telephone Encounter (Signed)
Have made multiple calls to patient's place of work and home, with no success at reaching patient. Several messages left. We'll continue to try.

## 2012-03-19 ENCOUNTER — Telehealth (HOSPITAL_COMMUNITY): Payer: Self-pay | Admitting: Physician Assistant

## 2012-03-31 NOTE — Telephone Encounter (Signed)
Mother calling for refills for son who is away at college.

## 2012-05-07 ENCOUNTER — Other Ambulatory Visit: Payer: Self-pay | Admitting: Gastroenterology

## 2012-06-05 ENCOUNTER — Ambulatory Visit (INDEPENDENT_AMBULATORY_CARE_PROVIDER_SITE_OTHER): Payer: Self-pay | Admitting: Physician Assistant

## 2012-06-05 DIAGNOSIS — F988 Other specified behavioral and emotional disorders with onset usually occurring in childhood and adolescence: Secondary | ICD-10-CM

## 2012-06-05 MED ORDER — LISDEXAMFETAMINE DIMESYLATE 60 MG PO CAPS: 60.0000 mg | ORAL_CAPSULE | ORAL | Status: DC

## 2012-08-14 NOTE — Progress Notes (Signed)
   [facility] Behavioral Health Follow-up Outpatient Visit  [name] 09-27-1960  Date: 06/05/2012   Subjective: [name] presents today to followup on her treatment for ADHD.  She sprained her knee and is having some difficulty ambulating. She reports that her ADHD is well managed, and feels the Vyvanse is dosed properly at 60 mg.  There were no vitals filed for this visit.  Mental Status Examination  Appearance: casual Alert: Yes Attention: good  Cooperative: Yes Eye Contact: Good Speech: clear and coherent Psychomotor Activity: Normal Memory/Concentration: intact Oriented: person, place, time/date and situation Mood: Euthymic Affect: Appropriate Thought Processes and Associations: Linear Fund of Knowledge: Good Thought Content: normal Insight: Good Judgement: Good  Diagnosis: ADHD, inattentive type  Treatment Plan: we will continue her Vyvanse 60 mg daily and she will return for followup in 3 months.  Joseth Weigel, PA-C

## 2012-10-02 ENCOUNTER — Other Ambulatory Visit (HOSPITAL_COMMUNITY): Payer: Self-pay | Admitting: *Deleted

## 2012-10-02 ENCOUNTER — Telehealth (HOSPITAL_COMMUNITY): Payer: Self-pay

## 2012-10-02 DIAGNOSIS — F988 Other specified behavioral and emotional disorders with onset usually occurring in childhood and adolescence: Secondary | ICD-10-CM

## 2012-10-02 MED ORDER — LISDEXAMFETAMINE DIMESYLATE 60 MG PO CAPS: 60.0000 mg | ORAL_CAPSULE | ORAL | Status: DC

## 2012-10-02 NOTE — Telephone Encounter (Signed)
90 day refill authorized by Jorje Guild, PA

## 2012-12-03 ENCOUNTER — Ambulatory Visit (INDEPENDENT_AMBULATORY_CARE_PROVIDER_SITE_OTHER): Payer: 59 | Admitting: Physician Assistant

## 2012-12-03 VITALS — BP 114/72 | HR 81 | Ht 68.0 in | Wt 181.8 lb

## 2012-12-03 DIAGNOSIS — F988 Other specified behavioral and emotional disorders with onset usually occurring in childhood and adolescence: Secondary | ICD-10-CM

## 2012-12-03 MED ORDER — LISDEXAMFETAMINE DIMESYLATE 60 MG PO CAPS: 60.0000 mg | ORAL_CAPSULE | ORAL | Status: DC

## 2012-12-03 NOTE — Progress Notes (Signed)
   [facility] Behavioral Health Follow-up Outpatient Visit  [name] 15-Nov-1960  Date: 12/03/2012   Subjective: [name] presents today to followup on her treatment for ADHD. She reports that she is doing well. She continues to take the Vyvanse as prescribed, and denies any bothersome side effects.  Filed Vitals:   12/03/12 1600  BP: 114/72  Pulse: 81    Mental Status Examination  Appearance: Casual Alert: Yes Attention: good  Cooperative: Yes Eye Contact: Good Speech: Clear and coherent Psychomotor Activity: Normal Memory/Concentration: Intact Oriented: person, place, time/date and situation Mood: Euthymic Affect: Appropriate Thought Processes and Associations: Linear Fund of Knowledge: Good Thought Content: Normal Insight: Good Judgement: Good  Diagnosis: ADHD, inattentive type  Treatment Plan: Continue Vyvanse 60 mg daily, and return for followup in 6 months.  [name], PA-C

## 2012-12-11 ENCOUNTER — Encounter (HOSPITAL_COMMUNITY): Payer: Self-pay | Admitting: *Deleted

## 2012-12-11 NOTE — Progress Notes (Signed)
Vyvanse authorized by [employer] - Effective 12/05/12 thru 12/05/13

## 2013-04-27 ENCOUNTER — Other Ambulatory Visit (HOSPITAL_COMMUNITY): Payer: Self-pay | Admitting: Psychiatry

## 2013-04-27 ENCOUNTER — Telehealth (HOSPITAL_COMMUNITY): Payer: Self-pay | Admitting: *Deleted

## 2013-04-27 NOTE — Telephone Encounter (Signed)
Pt left YQ:MVHQ 06/08/13 with Dr.Arfeen.Will run out of Vyvanse in 4 days.Can she get a refill?

## 2013-04-30 ENCOUNTER — Telehealth (HOSPITAL_COMMUNITY): Payer: Self-pay | Admitting: *Deleted

## 2013-04-30 DIAGNOSIS — F988 Other specified behavioral and emotional disorders with onset usually occurring in childhood and adolescence: Secondary | ICD-10-CM

## 2013-04-30 MED ORDER — LISDEXAMFETAMINE DIMESYLATE 60 MG PO CAPS: 60.0000 mg | ORAL_CAPSULE | ORAL | Status: DC

## 2013-04-30 NOTE — Telephone Encounter (Signed)
Chart reviewed, refill appropriate 

## 2013-05-05 ENCOUNTER — Telehealth (HOSPITAL_COMMUNITY): Payer: Self-pay

## 2013-05-05 NOTE — Telephone Encounter (Signed)
05/05/13 4:38pm Patient came and pick-up rx script.Marland KitchenMarguerite [name]

## 2013-06-08 ENCOUNTER — Ambulatory Visit (HOSPITAL_COMMUNITY): Payer: Self-pay | Admitting: Physician Assistant

## 2013-06-08 ENCOUNTER — Ambulatory Visit (INDEPENDENT_AMBULATORY_CARE_PROVIDER_SITE_OTHER): Payer: 59 | Admitting: Psychiatry

## 2013-06-08 ENCOUNTER — Encounter (HOSPITAL_COMMUNITY): Payer: Self-pay | Admitting: Psychiatry

## 2013-06-08 VITALS — BP 116/70 | HR 76 | Ht 68.0 in | Wt 172.6 lb

## 2013-06-08 DIAGNOSIS — F988 Other specified behavioral and emotional disorders with onset usually occurring in childhood and adolescence: Secondary | ICD-10-CM

## 2013-06-08 MED ORDER — LISDEXAMFETAMINE DIMESYLATE 60 MG PO CAPS: 60.0000 mg | ORAL_CAPSULE | ORAL | Status: DC

## 2013-06-08 NOTE — Progress Notes (Signed)
[facility] [phone] Progress Note  [name] [id number] 53 y.o.  06/08/2013 4:44 PM  Chief Complaint:  I need my medication.  History of Present Illness: Patient is a 53 year old Caucasian employed divorced female who has been seen in this office since July 2006.  She has diagnoses of depression and ADD.  She was seeing physician assistant who recently left the practice.  She is taking Vyvanse 60 mg every day for her ADD.  She is also taking Prozac and low-dose Xanax only as needed by her neurologist.  She is working in radiology department.  She endorsed sometimes stressful environment but denies any recent issues.  She has multiple sclerosis.  She is seeing neurologist on a regular basis and she has blood work every 6 months.  Patient denies any irritability, anger, insomnia or any paranoia.  She was to continue her Vyvanse.  She has no tremors.  She denies any chest pain.  She endorsed drinking only on social occasions but denies any binge drinking.  He denies any illegal drug use.  Her sleep is good.  Her weight is unchanged from the past.  Suicidal Ideation: No Plan Formed: No Patient has means to carry out plan: No  Homicidal Ideation: No Plan Formed: No Patient has means to carry out plan: No  Review of Systems: Psychiatric: Agitation: No Hallucination: No Depressed Mood: No Insomnia: No Hypersomnia: No Altered Concentration: No Feels Worthless: No Grandiose Ideas: No Belief In Special Powers: No New/Increased Substance Abuse: No Compulsions: No  Neurologic: Headache: No Seizure: No Paresthesias: No  Past Medical Family, Social History:  She is born and grew up in [place].  She's been divorced since 2004.  She has 2 grownup children.  She lives by herself.  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  The patient has multiple sclerosis, mitral valve prolapse status post cardiac ablation, GERD, arthritis and history of  migraine headaches.  Her primary care physician is Dr. Primus Bravo and her neurologist is Dr. Mariea Stable.  Outpatient Encounter Prescriptions as of 06/08/2013  Medication Sig  . ALPRAZolam (XANAX) 0.5 MG tablet Take 1 tablet (0.5 mg total) by mouth at bedtime as needed. Behavioral health  . Fingolimod HCl (GILENYA) 0.5 MG CAPS Take by mouth. Dr. Felecia Shelling  . FLUoxetine (PROZAC) 20 MG tablet Take 30 mg by mouth daily. Dr. Felecia Shelling  . lisdexamfetamine (VYVANSE) 60 MG capsule Take 1 capsule (60 mg total) by mouth every morning.  . pantoprazole (PROTONIX) 40 MG tablet Take 40 mg by mouth daily.  . [DISCONTINUED] lisdexamfetamine (VYVANSE) 60 MG capsule Take 1 capsule (60 mg total) by mouth every morning.  . [DISCONTINUED] lisdexamfetamine (VYVANSE) 60 MG capsule Take 1 capsule (60 mg total) by mouth every morning.    Past Psychiatric History/Hospitalization(s): Patient has history of depression started when she was in the process of divorce.  She was given Zoloft and then Prozac.  She also given Focalin, Ritalin and Strattera.  Patient denies any history of suicidal attempt or any psychosis. Anxiety: No Bipolar Disorder: No Depression: Yes Mania: No Psychosis: No Schizophrenia: No Personality Disorder: No Hospitalization for psychiatric illness: No History of Electroconvulsive Shock Therapy: No Prior Suicide Attempts: No  Physical Exam: Constitutional:  BP 116/70  Pulse 76  Ht 5\' 8"  (1.727 m)  Wt 172 lb 9.6 oz (78.291 kg)  BMI 26.25 kg/m2  General Appearance: alert, oriented, no acute distress and well nourished  Musculoskeletal: Strength & Muscle Tone: within normal limits  Gait & Station: normal Patient leans: N/A and Patient has a normal posture  Psychiatric: Speech (describe rate, volume, coherence, spontaneity, and abnormalities if any): Clear and coherent.  Normal rate and volume.  Thought Process (describe rate, content, abstract reasoning, and computation): Logical and goal  directed.  Associations: Relevant and Intact  Thoughts: normal  Mental Status: Orientation: oriented to person, place, time/date and situation Mood & Affect: anxiety Attention Span & Concentration: Fair  Medical Decision Making (Choose Three): Established Problem, Stable/Improving (1), Review of Psycho-Social Stressors (1), Review or order clinical lab tests (1), Decision to obtain old records (1), Review and summation of old records (2), Review of Last Therapy Session (1) and Review of Medication Regimen & Side Effects (2)  Assessment: Axis I: ADD, depressive disorder NOS  Axis II: Deferred  Axis III: See medical history  Axis IV: Mild  Axis V: 75-80   Plan: I review her history, collateral information, current medication and past history.  Patient is doing better on Vyvanse 60 mg daily.  She is also taking Prozac and sometimes Xanax which is provided by her primary care physician.  She has blood work a few months ago by her neurologist.  We will get records from her neurologist.  Recommend to continue Vyvanse 60 mg daily.  Patient does not have any side effects.  Recommend to call us back if she has any question or any concern.  Followup in 3 months.Time spent 25 minutes.  More than 50% of the time spent in psychoeducation, counseling and coordination of care.  Discuss safety plan that anytime having active suicidal thoughts or homicidal thoughts then patient need to call 911 or go to the local emergency room.    Lakindra Wible T., MD 06/08/2013

## 2013-07-22 ENCOUNTER — Telehealth (HOSPITAL_COMMUNITY): Payer: Self-pay | Admitting: *Deleted

## 2013-07-22 NOTE — Telephone Encounter (Signed)
Pt left VM 3/3 @ 1409: VM recv'd 3/4 @ 939:Requests new prescription for Xanax, is out of medicine.Has not had since 2013.Uses [facility]

## 2013-07-23 NOTE — Telephone Encounter (Signed)
Per MD, as patient last informed him she was getting this medication from primary care MD, he would prefer her to continue this until he sees her and can discuss. Pt verbalized understanding

## 2013-09-11 ENCOUNTER — Encounter (HOSPITAL_COMMUNITY): Payer: Self-pay | Admitting: Psychiatry

## 2013-09-11 ENCOUNTER — Ambulatory Visit (INDEPENDENT_AMBULATORY_CARE_PROVIDER_SITE_OTHER): Payer: 59 | Admitting: Psychiatry

## 2013-09-11 VITALS — BP 114/81 | HR 95 | Ht 67.5 in | Wt 176.4 lb

## 2013-09-11 DIAGNOSIS — F988 Other specified behavioral and emotional disorders with onset usually occurring in childhood and adolescence: Secondary | ICD-10-CM

## 2013-09-11 DIAGNOSIS — F329 Major depressive disorder, single episode, unspecified: Secondary | ICD-10-CM

## 2013-09-11 DIAGNOSIS — F3289 Other specified depressive episodes: Secondary | ICD-10-CM

## 2013-09-11 MED ORDER — LISDEXAMFETAMINE DIMESYLATE 60 MG PO CAPS: 60.0000 mg | ORAL_CAPSULE | ORAL | Status: DC

## 2013-09-11 MED ORDER — ALPRAZOLAM 0.5 MG PO TABS: 0.5000 mg | ORAL_TABLET | Freq: Every evening | ORAL | Status: DC | PRN

## 2013-09-11 MED ORDER — FLUOXETINE HCL 10 MG PO TABS: ORAL_TABLET | ORAL | Status: DC

## 2013-09-11 NOTE — Progress Notes (Signed)
[facility] [phone] Progress Note  [name] [id number] 53 y.o.  09/11/2013 9:08 AM  Chief Complaint:  I am doing better on the medication.  History of Present Illness: [name] came for her followup appointment.  She is compliant with her medication.  She admitted some time the stress and anxiety because of her job.  Her supervisors stepping down and she has more pressure at work.  She admits sometimes for sleep.  She used to take Xanax which is prescribed by physician assistant in this office long time ago.  Patient does not take that on a regular basis however she has taken in the past when she is very anxious and nervous.  Patient needed a new prescription of Xanax.  Patient overall doing better on her current Vyvanse.  She has good energy and she is able to do multitasking.  Her attention and focus is good.  She is also compliant with Prozac 30 mg.  She denies any recent depressive symptoms.  She denies any crying spells, feeling of hopelessness or worthlessness.  She is scheduled to see her neurologist Dr. Mariea Stable next week.  The patient will have blood work there.  Patient is not using drugs and drinks socially but denies any binge drinking.  Her appetite is good.  Her vitals are stable. 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   Suicidal Ideation: No Plan Formed: No Patient has means to carry out plan: No  Homicidal Ideation: No Plan Formed: No Patient has means to carry out plan: No  Review of Systems: Psychiatric: Agitation: No Hallucination: No Depressed Mood: No Insomnia: No Hypersomnia: No Altered Concentration: No Feels Worthless: No Grandiose Ideas: No Belief In Special Powers: No New/Increased Substance Abuse: No Compulsions: No  Neurologic: Headache: No Seizure: No Paresthesias: No  Past Medical Family, Social History:  She is born and grew up in [place].  She's been divorced since 2004.  She has 2 grownup children.  She lives  by herself.  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  The patient has multiple sclerosis, mitral valve prolapse status post cardiac ablation, GERD, arthritis and history of migraine headaches.  Her primary care physician is Dr. Primus Bravo and her neurologist is Dr. Felecia Shelling.  Outpatient Encounter Prescriptions as of 09/11/2013  Medication Sig  . ALPRAZolam (XANAX) 0.5 MG tablet Take 1 tablet (0.5 mg total) by mouth at bedtime as needed. Behavioral health  . Fingolimod HCl (GILENYA) 0.5 MG CAPS Take by mouth. Dr. Felecia Shelling  . FLUoxetine (PROZAC) 10 MG tablet Take 3 tab daiy  . lisdexamfetamine (VYVANSE) 60 MG capsule Take 1 capsule (60 mg total) by mouth every morning.  . pantoprazole (PROTONIX) 40 MG tablet Take 40 mg by mouth daily.  . [DISCONTINUED] ALPRAZolam (XANAX) 0.5 MG tablet Take 1 tablet (0.5 mg total) by mouth at bedtime as needed. Behavioral health  . [DISCONTINUED] FLUoxetine (PROZAC) 20 MG tablet Take 30 mg by mouth daily. Dr. Felecia Shelling  . [DISCONTINUED] lisdexamfetamine (VYVANSE) 60 MG capsule Take 1 capsule (60 mg total) by mouth every morning.    Past Psychiatric History/Hospitalization(s): Patient has history of depression started when she was in the process of divorce.  She was given Zoloft and then Prozac.  She also given Focalin, Ritalin and Strattera.  Patient denies any history of suicidal attempt or any psychosis. Anxiety: No Bipolar Disorder: No Depression: Yes Mania: No Psychosis: No Schizophrenia: No Personality Disorder: No Hospitalization for psychiatric illness: No History  of Electroconvulsive Shock Therapy: No Prior Suicide Attempts: No  Physical Exam: Constitutional:  BP 114/81  Pulse 95  Ht 5' 7.5" (1.715 m)  Wt 176 lb 6.4 oz (80.015 kg)  BMI 27.20 kg/m2  General Appearance: alert, oriented, no acute distress and well nourished  Musculoskeletal: Strength & Muscle Tone: within normal limits Gait & Station: normal Patient leans: N/A  and Patient has a normal posture  Psychiatric: Speech (describe rate, volume, coherence, spontaneity, and abnormalities if any): Clear and coherent.  Normal rate and volume.  Thought Process (describe rate, content, abstract reasoning, and computation): Logical and goal directed.  Associations: Relevant and Intact  Thoughts: normal  Mental Status: Orientation: oriented to person, place, time/date and situation Mood & Affect: anxiety Attention Span & Concentration: Fair  Established Problem, Stable/Improving (1), Review of Psycho-Social Stressors (1), Review of Last Therapy Session (1) and Review of Medication Regimen & Side Effects (2)  Assessment: Axis I: ADD, depressive disorder NOS  Axis II: Deferred  Axis III: See medical history  Axis IV: Mild  Axis V: 75-80   Plan:  I will continue her current psychotropic medication.  The patient has no side effects of medication.  A new prescription of Xanax 0.5 mg as needed along with Vyvanse 60 mg daily and Prozac 30 mg is given today.  Discussed risks and benefits of medication.  Talk about benzodiazepine dependence, tolerance and withdrawal symptoms.  Patient scheduled to see a neurologist next week and I recommended to have her blood work faxed to [place].  Recommended to call us back if she has any question or any concern.  I will see him again in 3 months.   Kyarah Enamorado T., MD 09/11/2013

## 2013-09-14 ENCOUNTER — Encounter: Payer: Self-pay | Admitting: Family Medicine

## 2013-09-14 ENCOUNTER — Ambulatory Visit (INDEPENDENT_AMBULATORY_CARE_PROVIDER_SITE_OTHER): Payer: Commercial Managed Care - PPO | Admitting: Family Medicine

## 2013-09-14 VITALS — BP 120/80 | HR 96 | Temp 98.3°F | Ht 67.5 in | Wt 173.9 lb

## 2013-09-14 DIAGNOSIS — J309 Allergic rhinitis, unspecified: Secondary | ICD-10-CM

## 2013-09-14 MED ORDER — FLUTICASONE PROPIONATE 50 MCG/ACT NA SUSP: 2.0000 | Freq: Every day | NASAL | Status: DC

## 2013-09-14 NOTE — Progress Notes (Signed)
Subjective:     Patient ID: [name], female   DOB: Jan 01, 1961, 53 y.o.   MRN: [id number]  Patient presented for same day appointment.  HPI  FATIGUE / SICK / EAR ACHE: - Symptoms started on Friday, complains of generalized body aches, fatigue, sore throat, R-ear ache - Admits to mostly being bedbound x 3 days with very little energy, today mostly unchanged and without improvement. - Tried Tylenol-PM / Cold-Flu (without relief)  Admits low-grade intermittent fever with chills. +Contact with sick patients. Denies CP, SOB, abd pain, n/v/d, HA, sinus congestion, cough.  PMH: Multiple Sclerosis  I have reviewed and updated the following as appropriate: allergies and current medications  Social Hx: Current smoker (1/4 ppd, 20+ years) - Works at [employer]   Review of Systems  See above HPI     Objective:   Physical Exam  BP 120/80  Pulse 96  Temp(Src) 98.3 F (36.8 C) (Oral)  Ht 5' 7.5" (1.715 m)  Wt 173 lb 14.4 oz (78.881 kg)  BMI 26.82 kg/m2  Gen - tired but well-appearing, NAD HEENT - PERRL, EOMI, sinuses non-tender, b/l TM's clear w/o erythema, R-ear +mild bulging TM with minimal serous effusion, mild edematous turbinates w/o congestion, oropharynx clear, MMM Neck - supple, non-tender, no LAD, no thyromegaly Heart - RRR, no murmurs heard Lungs - CTAB, no wheezing, crackles, or rhonchi. Normal work of breathing. Ext - non-tender, no edema, peripheral pulses intact +2 b/l Skin - warm, dry, no rashes Neuro - awake, alert, oriented, grossly non-focal      Assessment:     See specific A&P problem list for details.     Plan:     See specific A&P problem list for details.

## 2013-09-14 NOTE — Patient Instructions (Signed)
Dear [name], Thank you for coming in to clinic today. It was good to meet you!  Today we discussed your Fatigue, Sinus / Ear pressure. 1. I think that you have a viral syndrome, which may have an allergy component to it causing some swelling and back-up in your sinuses and ears. There was no evidence of sinus, ear, or throat infection on my exam today. 2. Prescribed Flonase (use 2 sprays in each nostril) daily for the next 2 weeks. You may continue using it if it is helping for a few more weeks until Spring is over. 3. Continue with Tylenol, warm facial compresses / massage techniques.  Some important numbers from today's visit: BP - 120/80  Please schedule a follow-up appointment with Dr. Gwendlyn Deutscher - as needed, if you symptoms get worse or do not get better within 7 to 10 days.  If you have any other questions or concerns, please feel free to call the clinic to contact me. You may also schedule an earlier appointment if necessary.  However, if your symptoms get significantly worse, please go to the Emergency Department to seek immediate medical attention.  Nobie Putnam, [place]

## 2013-09-14 NOTE — Assessment & Plan Note (Addendum)
Suspected allergic rhinitis with R-eustachian tube dysfunction with likely secondary viral URI. Unlikely acute bacterial sinusitis (afebrile, no sinus pain, no purulent drainage).   Plan:  1. Rx Flonase 2 sprays daily - x 2 weeks, may continue  2. Consider trial OTC anti-histamine 3. Conservative management - Tylenol, hydration, warm compresses, effleurage. 4. RTC in 7-10 days if symptoms persist or worsen, febrile. Concern for developing acute sinusitis vs R-otitis media. Especially given decreased immune response in setting of treated MS.

## 2013-10-22 ENCOUNTER — Other Ambulatory Visit (HOSPITAL_COMMUNITY): Payer: Self-pay | Admitting: Neurology

## 2013-10-22 DIAGNOSIS — G35 Multiple sclerosis: Secondary | ICD-10-CM

## 2013-10-26 ENCOUNTER — Ambulatory Visit (HOSPITAL_COMMUNITY)
Admission: RE | Admit: 2013-10-26 | Discharge: 2013-10-26 | Disposition: A | Discharge status: Home / Self Care | Payer: 59 | Source: Ambulatory Visit | Attending: Neurology | Admitting: Neurology

## 2013-10-26 DIAGNOSIS — G35 Multiple sclerosis: Secondary | ICD-10-CM | POA: Insufficient documentation

## 2013-10-26 MED ORDER — GADOBENATE DIMEGLUMINE 529 MG/ML IV SOLN
17.0000 mL | Freq: Once | INTRAVENOUS | Status: AC | PRN
  Administered 2013-10-26: 17 mL via INTRAVENOUS

## 2013-12-11 ENCOUNTER — Ambulatory Visit (HOSPITAL_COMMUNITY): Payer: Self-pay | Admitting: Psychiatry

## 2013-12-14 ENCOUNTER — Ambulatory Visit (INDEPENDENT_AMBULATORY_CARE_PROVIDER_SITE_OTHER): Payer: 59 | Admitting: Psychiatry

## 2013-12-14 ENCOUNTER — Encounter (HOSPITAL_COMMUNITY): Payer: Self-pay | Admitting: Psychiatry

## 2013-12-14 VITALS — BP 121/84 | HR 73 | Ht 67.5 in | Wt 177.0 lb

## 2013-12-14 DIAGNOSIS — F988 Other specified behavioral and emotional disorders with onset usually occurring in childhood and adolescence: Secondary | ICD-10-CM

## 2013-12-14 DIAGNOSIS — F3289 Other specified depressive episodes: Secondary | ICD-10-CM

## 2013-12-14 DIAGNOSIS — F329 Major depressive disorder, single episode, unspecified: Secondary | ICD-10-CM

## 2013-12-14 MED ORDER — FLUOXETINE HCL 10 MG PO TABS: ORAL_TABLET | ORAL | Status: DC

## 2013-12-14 MED ORDER — LISDEXAMFETAMINE DIMESYLATE 60 MG PO CAPS: 60.0000 mg | ORAL_CAPSULE | ORAL | Status: DC

## 2013-12-14 NOTE — Progress Notes (Signed)
[place] [phone] Progress Note  CASSANDRA HARBOLD [id number] 53 y.o.  12/14/2013 4:44 PM  Chief Complaint:  Medication management and followup.    History of Present Illness: Eesha came for her followup appointment.  She is taking her medication as prescribed.  She has not taken Xanax in the past 3 months .  She does not feel that she needed.  Her anxiety is well controlled.  She denies any irritability, anger, mood swings.  Her attention and focus is good.  Recently she seen her neurologist and there has been no new changes.  She had a blood work at [facility] and she reported everything is normal.  Her sleep is good.  She denies any paranoia or any sedation.  She denies any crying spells .  Her vitals are stable.  She is taking Prozac 30 mg and Vyvanse 60 mg daily.  She see her neurologist Dr. Mariea Stable on a regular basis.  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  Patient denies drinking or using any illegal substances.  Her vitals are stable.  Her appetite is okay.  Suicidal Ideation: No Plan Formed: No Patient has means to carry out plan: No  Homicidal Ideation: No Plan Formed: No Patient has means to carry out plan: No  Review of Systems: Psychiatric: Agitation: No Hallucination: No Depressed Mood: No Insomnia: No Hypersomnia: No Altered Concentration: No Feels Worthless: No Grandiose Ideas: No Belief In Special Powers: No New/Increased Substance Abuse: No Compulsions: No  Neurologic: Headache: No Seizure: No Paresthesias: No  Medical History:  She has multiple sclerosis, mitral valve prolapse status post cardiac ablation, GERD, arthritis and history of migraine headaches.  Her neurologist is Dr. Felecia Shelling.  Outpatient Encounter Prescriptions as of 12/14/2013  Medication Sig  . ALPRAZolam (XANAX) 0.5 MG tablet Take 1 tablet (0.5 mg total) by mouth at bedtime as needed. Behavioral health  . Fingolimod HCl (GILENYA) 0.5 MG CAPS Take by mouth. Dr.  Felecia Shelling  . FLUoxetine (PROZAC) 10 MG tablet Take 3 tab daiy  . fluticasone (FLONASE) 50 MCG/ACT nasal spray Place 2 sprays into both nostrils daily.  [name] lisdexamfetamine (VYVANSE) 60 MG capsule Take 1 capsule (60 mg total) by mouth every morning.  . pantoprazole (PROTONIX) 40 MG tablet Take 40 mg by mouth daily.  . [DISCONTINUED] FLUoxetine (PROZAC) 10 MG tablet Take 3 tab daiy  . [DISCONTINUED] lisdexamfetamine (VYVANSE) 60 MG capsule Take 1 capsule (60 mg total) by mouth every morning.    Past Psychiatric History/Hospitalization(s): Patient has history of depression started when she was in the process of divorce.  She was given Zoloft and then Prozac.  She also given Focalin, Ritalin and Strattera.  Patient denies any history of suicidal attempt or any psychosis. Anxiety: No Bipolar Disorder: No Depression: Yes Mania: No Psychosis: No Schizophrenia: No Personality Disorder: No Hospitalization for psychiatric illness: No History of Electroconvulsive Shock Therapy: No Prior Suicide Attempts: No  Physical Exam: Constitutional:  BP 121/84  Pulse 73  Ht 5' 7.5" (1.715 m)  Wt 177 lb (80.287 kg)  BMI 27.30 kg/m2  General Appearance: alert, oriented, no acute distress and well nourished  Musculoskeletal: Strength & Muscle Tone: within normal limits Gait & Station: normal Patient leans: N/A and Patient has a normal posture  Psychiatric: Speech (describe rate, volume, coherence, spontaneity, and abnormalities if any): Clear and coherent.  Normal rate and volume.  Thought Process (describe rate, content, abstract reasoning, and computation): Logical and goal directed.  Associations:  Relevant and Intact  Thoughts: normal  Mental Status: Orientation: oriented to person, place, time/date and situation Mood & Affect: anxiety Attention Span & Concentration: Fair  Established Problem, Stable/Improving (1), Review of Psycho-Social Stressors (1), Review of Last Therapy Session (1) and  Review of Medication Regimen & Side Effects (2)  Assessment: Axis I: ADD, depressive disorder NOS  Axis II: Deferred  Axis III: See medical history  Axis IV: Mild  Axis V: 75-80   Plan:  Patient is a stable on her current psychotropic medication.  She is not taking Xanax and she does not require any prescription at this time.  I will continue Vyvanse 60 mg daily and Prozac 30 mg daily.  Discussed risks and benefits of medication.  Recommended to call us back if she has any question or any concern.  I will see him again in 3 months.   Shelene Krage T., MD 12/14/2013

## 2014-02-03 ENCOUNTER — Ambulatory Visit (INDEPENDENT_AMBULATORY_CARE_PROVIDER_SITE_OTHER): Payer: 59 | Admitting: Family Medicine

## 2014-02-03 ENCOUNTER — Encounter: Payer: Self-pay | Admitting: Family Medicine

## 2014-02-03 VITALS — BP 103/70 | HR 88 | Temp 98.1°F | Ht 67.5 in | Wt 175.1 lb

## 2014-02-03 DIAGNOSIS — F17200 Nicotine dependence, unspecified, uncomplicated: Secondary | ICD-10-CM

## 2014-02-03 DIAGNOSIS — Z720 Tobacco use: Secondary | ICD-10-CM

## 2014-02-03 DIAGNOSIS — R6889 Other general symptoms and signs: Secondary | ICD-10-CM

## 2014-02-03 NOTE — Assessment & Plan Note (Signed)
Still smoking about 0.5ppd, not interested in quitting at this time.

## 2014-02-03 NOTE — Progress Notes (Signed)
Patient ID: [name], female   DOB: Aug 21, 1960, 53 y.o.   MRN: [id number]   Subjective:    Patient ID: [name], female    DOB: 03-29-1961, 53 y.o.   MRN: [id number]  HPI  CC: throat tightness  # Flu-like illness:  Started Sunday with pain in both ears.  Some congestion of nose, but primarily sore throat on Monday, body aches and low grade fever (not measured)  Sore throat turned into tightness lower near collar bone, but no pain with swallowing. Dry cough.  No difficulty swallowing ROS: no dizziness, no changes in vision, no CP, no SOB, no heartburn or reflux   Review of Systems   See HPI for ROS. All other systems reviewed and are negative.  Past medical history, surgical, family, and social history reviewed and updated in the EMR. No new updates were made today. Objective:  BP 103/70  Pulse 88  Temp(Src) 98.1 F (36.7 C) (Oral)  Ht 5' 7.5" (1.715 m)  Wt 175 lb 1.6 oz (79.425 kg)  BMI 27.00 kg/m2  SpO2 98% Vitals reviewed  General: NAD, pleasant HEENT: PERRl, EOMI, no scleral injection. TMs pearly gray bilaterally, no erythema or effusion. Nasal turbinates mildy erythematous and boggy, no posterior pharyngeal erythema or exudate. Neck: No lymphadenopathy. No enlarged thyroid or palpable nodules  Assessment & Plan:  See Problem List Documentation

## 2014-02-03 NOTE — Patient Instructions (Signed)
You may have had a viral infection of the upper airway that is resolving. It does not look like you have any ear infections or pneumonia. At this time I would not recommend any antibiotics. I would continue using over the counter medications to help with any symptoms (guafensin with any mucous you are coughing up, dextromorphan to help with cough suppression, antihistamines to help with any allergies or nasal congestion, saline sprays or rinses for the nose). Make sure you drink plenty of fluids to stay hydrated.

## 2014-02-03 NOTE — Assessment & Plan Note (Signed)
Seen several months ago for similar sounding symptoms. Pt concerned she is immunosuppressed from her MS however patient sx resolved with OTC remedies at last visit. Mild URI vs flu-like illness. Improving already, however still has vague tightness symptoms of lower neck which may be some irritation from dry cough/evolving URI vs ?esophogeal etiology, though it is constant and does not appear to be reflux or esophageal spasm.  Plan: OTC symptomatic cold remedies, f/u as needed or if not improved over next few weeks.

## 2014-03-16 ENCOUNTER — Ambulatory Visit (INDEPENDENT_AMBULATORY_CARE_PROVIDER_SITE_OTHER): Payer: 59 | Admitting: Psychiatry

## 2014-03-16 ENCOUNTER — Encounter (HOSPITAL_COMMUNITY): Payer: Self-pay | Admitting: Psychiatry

## 2014-03-16 VITALS — BP 133/80 | HR 109 | Ht 67.5 in | Wt 178.2 lb

## 2014-03-16 DIAGNOSIS — F909 Attention-deficit hyperactivity disorder, unspecified type: Secondary | ICD-10-CM

## 2014-03-16 DIAGNOSIS — F988 Other specified behavioral and emotional disorders with onset usually occurring in childhood and adolescence: Secondary | ICD-10-CM

## 2014-03-16 DIAGNOSIS — F32A Depression, unspecified: Secondary | ICD-10-CM

## 2014-03-16 DIAGNOSIS — F329 Major depressive disorder, single episode, unspecified: Secondary | ICD-10-CM

## 2014-03-16 MED ORDER — FLUOXETINE HCL 20 MG PO TABS: 20.0000 mg | ORAL_TABLET | Freq: Every day | ORAL | Status: DC

## 2014-03-16 MED ORDER — LISDEXAMFETAMINE DIMESYLATE 60 MG PO CAPS: 60.0000 mg | ORAL_CAPSULE | ORAL | Status: DC

## 2014-03-16 NOTE — Progress Notes (Signed)
[place] [phone] Progress Note  SURINA STORTS [id number] 53 y.o.  03/16/2014 2:24 PM  Chief Complaint:  Medication management and followup.    History of Present Illness: Kenae came for her followup appointment.  She is taking Vyvanse every day area she cut down her Prozac 20 mg because she felt she does not need 30 mg.  She is actually doing better with 20 mg.  Since the last visit she has only taken twice Xanax for severe anxiety.  She has more energy.  She sleeping good.  Her job is a stressful but also she feels very secure.  She denies any irritability, anger, mood swings.  Her attention and concentration is good.  She is able to do multitasking.  Her sleep is okay.  Her vitals are stable.  She denies any crying spells, paranoia or any hallucination.  She continues to have chronic migraine headache however it has been stable. She see her neurologist Dr. Mariea Stable on a regular basis.  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  Patient denies drinking or using any illegal substances.    Suicidal Ideation: No Plan Formed: No Patient has means to carry out plan: No  Homicidal Ideation: No Plan Formed: No Patient has means to carry out plan: No  Review of Systems: Psychiatric: Agitation: No Hallucination: No Depressed Mood: No Insomnia: No Hypersomnia: No Altered Concentration: No Feels Worthless: No Grandiose Ideas: No Belief In Special Powers: No New/Increased Substance Abuse: No Compulsions: No  Neurologic: Headache: No Seizure: No Paresthesias: No  Medical History:  She has multiple sclerosis, mitral valve prolapse status post cardiac ablation, GERD, arthritis and history of migraine headaches.  Her neurologist is Dr. Felecia Shelling.  Outpatient Encounter Prescriptions as of 03/16/2014  Medication Sig  . ALPRAZolam (XANAX) 0.5 MG tablet Take 1 tablet (0.5 mg total) by mouth at bedtime as needed. Behavioral health  . Fingolimod HCl (GILENYA) 0.5 MG  CAPS Take by mouth. Dr. Felecia Shelling  . FLUoxetine (PROZAC) 20 MG tablet Take 1 tablet (20 mg total) by mouth daily.  [name] lisdexamfetamine (VYVANSE) 60 MG capsule Take 1 capsule (60 mg total) by mouth every morning.  . pantoprazole (PROTONIX) 40 MG tablet Take 40 mg by mouth daily.  . [DISCONTINUED] FLUoxetine (PROZAC) 10 MG tablet Take 3 tab daiy  . [DISCONTINUED] lisdexamfetamine (VYVANSE) 60 MG capsule Take 1 capsule (60 mg total) by mouth every morning.    Past Psychiatric History/Hospitalization(s): Patient has history of depression started when she was in the process of divorce.  She was given Zoloft and then Prozac.  She also given Focalin, Ritalin and Strattera.  Patient denies any history of suicidal attempt or any psychosis. Anxiety: No Bipolar Disorder: No Depression: Yes Mania: No Psychosis: No Schizophrenia: No Personality Disorder: No Hospitalization for psychiatric illness: No History of Electroconvulsive Shock Therapy: No Prior Suicide Attempts: No  Physical Exam: Constitutional:  BP 133/80  Pulse 109  Ht 5' 7.5" (1.715 m)  Wt 178 lb 3.2 oz (80.831 kg)  BMI 27.48 kg/m2  General Appearance: alert, oriented, no acute distress and well nourished  Musculoskeletal: Strength & Muscle Tone: within normal limits Gait & Station: normal Patient leans: N/A and Patient has a normal posture  Psychiatric: Speech (describe rate, volume, coherence, spontaneity, and abnormalities if any): Clear and coherent.  Normal rate and volume.  Thought Process (describe rate, content, abstract reasoning, and computation): Logical and goal directed.  Associations: Relevant and Intact  Thoughts: normal  Mental Status: Orientation: oriented to person, place, time/date and situation Mood & Affect: anxiety Attention Span & Concentration: Fair  Established Problem, Stable/Improving (1), Review of Psycho-Social Stressors (1), Review of Last Therapy Session (1), Review of Medication Regimen &  Side Effects (2) and Review of New Medication or Change in Dosage (2)  Assessment: Axis I: ADD, depressive disorder NOS  Axis II: Deferred  Axis III: See medical history  Axis IV: Mild  Axis V: 75-80   Plan:  Patient is taking Prozac 20 mg daily.  She is not taking Xanax and she does not require any prescription at this time.  I will continue Vyvanse 60 mg daily and Prozac 20 mg daily.  Discussed risks and benefits of medication.  Recommended to call us back if she has any question or any concern.  I will see him again in 3 months.   ARFEEN,SYED T., MD 03/16/2014

## 2014-06-17 ENCOUNTER — Encounter (HOSPITAL_COMMUNITY): Payer: Self-pay | Admitting: Psychiatry

## 2014-06-17 ENCOUNTER — Ambulatory Visit (INDEPENDENT_AMBULATORY_CARE_PROVIDER_SITE_OTHER): Payer: 59 | Admitting: Psychiatry

## 2014-06-17 VITALS — BP 121/81 | HR 84 | Ht 68.0 in | Wt 180.1 lb

## 2014-06-17 DIAGNOSIS — F988 Other specified behavioral and emotional disorders with onset usually occurring in childhood and adolescence: Secondary | ICD-10-CM

## 2014-06-17 DIAGNOSIS — F909 Attention-deficit hyperactivity disorder, unspecified type: Secondary | ICD-10-CM

## 2014-06-17 DIAGNOSIS — F329 Major depressive disorder, single episode, unspecified: Secondary | ICD-10-CM

## 2014-06-17 DIAGNOSIS — F32A Depression, unspecified: Secondary | ICD-10-CM

## 2014-06-17 MED ORDER — FLUOXETINE HCL 20 MG PO TABS: 20.0000 mg | ORAL_TABLET | Freq: Every day | ORAL | Status: DC

## 2014-06-17 MED ORDER — LISDEXAMFETAMINE DIMESYLATE 60 MG PO CAPS: 60.0000 mg | ORAL_CAPSULE | ORAL | Status: DC

## 2014-06-17 NOTE — Progress Notes (Signed)
[place] [phone] Progress Note  [name] [id number] 54 y.o.  06/17/2014 4:07 PM  Chief Complaint:  Medication management and followup.    History of Present Illness: [name] came for her followup appointment.  She reported that she was sick around Christmastime but now she is better.  She is taking her medication as prescribed.  She is taking Prozac 20 mg daily and Vyvanse 60 mg daily.  She has not taken Xanax in recent months as her anxiety is under control.  She sleeping good.  She mentioned her job is stressful but overall she is handling better.  She denies any irritability, anger, crying spells.  Her energy level is good.  She recently seen her neurologist Dr. Margretta Sidle and her GI specialist Dr. Malachi Bonds however no new medication added.  She had a blood work and she was told her blood work was normal.  Patient denies any feeling of hopelessness or worthlessness.  Her attention and cognition is good.  She is able to do multitasking.  Patient denies drinking or using any illegal substances.  She is working as a Therapist, music in the hospital.  Her appetite is okay.  Her vitals are stable.  Suicidal Ideation: No Plan Formed: No Patient has means to carry out plan: No  Homicidal Ideation: No Plan Formed: No Patient has means to carry out plan: No  Review of Systems: Psychiatric: Agitation: No Hallucination: No Depressed Mood: No Insomnia: No Hypersomnia: No Altered Concentration: No Feels Worthless: No Grandiose Ideas: No Belief In Special Powers: No New/Increased Substance Abuse: No Compulsions: No  Neurologic: Headache: No Seizure: No Paresthesias: No  Medical History:  She has multiple sclerosis, mitral valve prolapse status post cardiac ablation, GERD, arthritis and history of migraine headaches.  Her neurologist is Dr. Felecia Shelling.  Outpatient Encounter Prescriptions as of 06/17/2014  Medication Sig  . ALPRAZolam (XANAX) 0.5 MG tablet Take 1 tablet (0.5 mg total)  by mouth at bedtime as needed. Behavioral health  . Fingolimod HCl (GILENYA) 0.5 MG CAPS Take by mouth. Dr. Felecia Shelling  . FLUoxetine (PROZAC) 20 MG tablet Take 1 tablet (20 mg total) by mouth daily.  [name] lisdexamfetamine (VYVANSE) 60 MG capsule Take 1 capsule (60 mg total) by mouth every morning.  . pantoprazole (PROTONIX) 40 MG tablet Take 40 mg by mouth daily.  . [DISCONTINUED] FLUoxetine (PROZAC) 20 MG tablet Take 1 tablet (20 mg total) by mouth daily.  . [DISCONTINUED] lisdexamfetamine (VYVANSE) 60 MG capsule Take 1 capsule (60 mg total) by mouth every morning.    Past Psychiatric History/Hospitalization(s): Patient has history of depression started when she was in the process of divorce.  She was given Zoloft and then Prozac.  She also given Focalin, Ritalin and Strattera.  Patient denies any history of suicidal attempt or any psychosis. Anxiety: No Bipolar Disorder: No Depression: Yes Mania: No Psychosis: No Schizophrenia: No Personality Disorder: No Hospitalization for psychiatric illness: No History of Electroconvulsive Shock Therapy: No Prior Suicide Attempts: No  Physical Exam: Constitutional:  BP 121/81 mmHg  Pulse 84  Ht 5\' 8"  (1.727 m)  Wt 180 lb 1.9 oz (81.7 kg)  BMI 27.39 kg/m2  General Appearance: alert, oriented, no acute distress and well nourished  Musculoskeletal: Strength & Muscle Tone: within normal limits Gait & Station: normal Patient leans: N/A and Patient has a normal posture  Psychiatric: Speech (describe rate, volume, coherence, spontaneity, and abnormalities if any): Clear and coherent.  Normal rate and volume.  Thought Process (describe rate, content,  abstract reasoning, and computation): Logical and goal directed.  Associations: Relevant and Intact  Thoughts: normal  Mental Status: Orientation: oriented to person, place, time/date and situation Mood & Affect: normal affect Attention Span & Concentration: Fair  Established Problem,  Stable/Improving (1), Review of Psycho-Social Stressors (1), Review of Last Therapy Session (1) and Review of Medication Regimen & Side Effects (2)  Assessment: Axis I: ADD, depressive disorder NOS  Axis II: Deferred  Axis III: See medical history  Plan:  Patient is taking Prozac 20 mg daily and Vyvanse 60 mg.  She rarely takes Xanax.  Discussed medication side effects and benefits. I will continue Vyvanse 60 mg daily and Prozac 20 mg daily.  Recommended to have her blood work results faxed to [place].  Recommended to call us back if she has any question or any concern.  I will see him again in 3 months.   Raushanah Osmundson T., MD 06/17/2014

## 2014-09-16 ENCOUNTER — Ambulatory Visit (HOSPITAL_COMMUNITY): Payer: Self-pay | Admitting: Psychiatry

## 2014-09-17 ENCOUNTER — Encounter (HOSPITAL_COMMUNITY): Payer: Self-pay | Admitting: Psychiatry

## 2014-09-17 ENCOUNTER — Ambulatory Visit (INDEPENDENT_AMBULATORY_CARE_PROVIDER_SITE_OTHER): Payer: 59 | Admitting: Psychiatry

## 2014-09-17 VITALS — BP 113/76 | HR 94 | Ht 67.5 in | Wt 177.2 lb

## 2014-09-17 DIAGNOSIS — F909 Attention-deficit hyperactivity disorder, unspecified type: Secondary | ICD-10-CM | POA: Diagnosis not present

## 2014-09-17 DIAGNOSIS — F329 Major depressive disorder, single episode, unspecified: Secondary | ICD-10-CM | POA: Diagnosis not present

## 2014-09-17 DIAGNOSIS — F988 Other specified behavioral and emotional disorders with onset usually occurring in childhood and adolescence: Secondary | ICD-10-CM

## 2014-09-17 DIAGNOSIS — F32A Depression, unspecified: Secondary | ICD-10-CM

## 2014-09-17 MED ORDER — LISDEXAMFETAMINE DIMESYLATE 60 MG PO CAPS
60.0000 mg | ORAL_CAPSULE | ORAL | Status: DC
Start: 2014-09-17 — End: 2015-01-17

## 2014-09-17 MED ORDER — FLUOXETINE HCL 20 MG PO TABS: 20.0000 mg | ORAL_TABLET | Freq: Every day | ORAL | Status: DC

## 2014-09-17 NOTE — Progress Notes (Signed)
[place] [phone] Progress Note  [name] [id number] 54 y.o.  09/17/2014 12:10 PM  Chief Complaint:  Medication management and followup.    History of Present Illness: [name] came for her followup appointment.  She is taking her medication as prescribed.  She takes Prozac 20 mg and Vyvanse 60 mg daily.  She has not taken Xanax in recent weeks.  She admitted sometime feeling overwhelmed at work but denies any recent crying spells, irritability or any feeling of hopelessness.  Her sleep is good.  She denies any feeling of nervousness and there has been no panic attack.  She has no tremors, shakes, palpitation or any concerns with the medication.  Her energy level is good.  She is able to do multitasking.  She saw her neurologist in December and she had blood work however we do not have blood results available.  Her appetite is okay.  Her vitals are stable.  She is happy because her son is graduating from [employer] and she is going to [employer] in few days.  Patient denies drinking or using any illegal substances. She is working as a Therapist, music in the hospital.    Suicidal Ideation: No Plan Formed: No Patient has means to carry out plan: No  Homicidal Ideation: No Plan Formed: No Patient has means to carry out plan: No  Review of Systems: Psychiatric: Agitation: No Hallucination: No Depressed Mood: No Insomnia: No Hypersomnia: No Altered Concentration: No Feels Worthless: No Grandiose Ideas: No Belief In Special Powers: No New/Increased Substance Abuse: No Compulsions: No  Neurologic: Headache: No Seizure: No Paresthesias: No  Medical History:  She has multiple sclerosis, mitral valve prolapse status post cardiac ablation, GERD, arthritis and history of migraine headaches.  Her neurologist is Dr. Felecia Shelling.  Outpatient Encounter Prescriptions as of 09/17/2014  Medication Sig  . ALPRAZolam (XANAX) 0.5 MG tablet Take 1 tablet (0.5 mg total) by mouth at  bedtime as needed. Behavioral health  . Fingolimod HCl (GILENYA) 0.5 MG CAPS Take by mouth. Dr. Felecia Shelling  . FLUoxetine (PROZAC) 20 MG tablet Take 1 tablet (20 mg total) by mouth daily.  [name] lisdexamfetamine (VYVANSE) 60 MG capsule Take 1 capsule (60 mg total) by mouth every morning.  . pantoprazole (PROTONIX) 40 MG tablet Take 40 mg by mouth daily.  . [DISCONTINUED] FLUoxetine (PROZAC) 20 MG tablet Take 1 tablet (20 mg total) by mouth daily.  . [DISCONTINUED] lisdexamfetamine (VYVANSE) 60 MG capsule Take 1 capsule (60 mg total) by mouth every morning.    Past Psychiatric History/Hospitalization(s): Patient has history of depression started when she was in the process of divorce.  She was given Zoloft and then Prozac.  She also given Focalin, Ritalin and Strattera.  Patient denies any history of suicidal attempt or any psychosis. Anxiety: No Bipolar Disorder: No Depression: Yes Mania: No Psychosis: No Schizophrenia: No Personality Disorder: No Hospitalization for psychiatric illness: No History of Electroconvulsive Shock Therapy: No Prior Suicide Attempts: No  Physical Exam: Constitutional:  BP 113/76 mmHg  Pulse 94  Ht 5' 7.5" (1.715 m)  Wt 177 lb 3.2 oz (80.377 kg)  BMI 27.33 kg/m2  General Appearance: alert, oriented, no acute distress and well nourished  Musculoskeletal: Strength & Muscle Tone: within normal limits Gait & Station: normal Patient leans: N/A and Patient has a normal posture  Psychiatric: Speech (describe rate, volume, coherence, spontaneity, and abnormalities if any): Clear and coherent.  Normal rate and volume.  Thought Process (describe rate, content, abstract reasoning,  and computation): Logical and goal directed.  Associations: Relevant and Intact  Thoughts: normal  Mental Status: Orientation: oriented to person, place, time/date and situation Mood & Affect: normal affect Attention Span & Concentration: Fair  Established Problem, Stable/Improving  (1), Review of Psycho-Social Stressors (1), Decision to obtain old records (1), Review of Last Therapy Session (1) and Review of Medication Regimen & Side Effects (2)  Assessment: Axis I: ADD, depressive disorder NOS  Axis II: Deferred  Axis III: See medical history  Plan:  Patient is stable on Prozac 20 mg and Vyvanse 60 mg daily.  She has no side effects.  I ask her to have her fax her blood results to [place] .  Continue Prozac 20 mg and Vyvanse 60 mg daily.  Recommended to call us back if she has any question, concern or if she feels worsening of the symptom.  Follow-up in 3 months.   Julius Matus T., MD 09/17/2014

## 2014-10-22 ENCOUNTER — Ambulatory Visit (INDEPENDENT_AMBULATORY_CARE_PROVIDER_SITE_OTHER): Payer: 59

## 2014-10-22 ENCOUNTER — Ambulatory Visit (INDEPENDENT_AMBULATORY_CARE_PROVIDER_SITE_OTHER): Payer: 59 | Admitting: Podiatry

## 2014-10-22 ENCOUNTER — Encounter: Payer: Self-pay | Admitting: Podiatry

## 2014-10-22 VITALS — BP 125/76 | HR 97 | Resp 16

## 2014-10-22 DIAGNOSIS — Q667 Congenital pes cavus: Secondary | ICD-10-CM | POA: Diagnosis not present

## 2014-10-22 DIAGNOSIS — B079 Viral wart, unspecified: Secondary | ICD-10-CM

## 2014-10-22 DIAGNOSIS — M79672 Pain in left foot: Secondary | ICD-10-CM | POA: Diagnosis not present

## 2014-10-22 DIAGNOSIS — M216X9 Other acquired deformities of unspecified foot: Secondary | ICD-10-CM

## 2014-10-22 DIAGNOSIS — B078 Other viral warts: Secondary | ICD-10-CM

## 2014-10-22 NOTE — Progress Notes (Signed)
Subjective:     Patient ID: [name], female   DOB: 1961-03-05, 54 y.o.   MRN: [id number]  HPI presents stating I have pain in the plantar aspect of my left foot that has been bothering me and it's been about 6 months. I've tried soaking it using medication and trimming without relief   Review of Systems  All other systems reviewed and are negative.      Objective:   Physical Exam  Constitutional: She is oriented to person, place, and time.  Cardiovascular: Intact distal pulses.   Musculoskeletal: Normal range of motion.  Neurological: She is oriented to person, place, and time.  Skin: Skin is warm.  Nursing note and vitals reviewed.  neurovascular status intact muscle strength adequate range of motion within normal limits. Patient has a lesion just distal to the second metatarsal left with keratotic tissue formation and what appears to be a lucent core. Patient is well oriented 3 and has good digital perfusion     Assessment:     Possibility for porokeratosis versus verruca versus plantar callus formation    Plan:     H&P and x-rays reviewed with patient. Deep debridement of lesions accomplished with removal of lesion and applied strong salicylic acid to the area along with sterile dressing. Instructed what to do if blistering occurs

## 2014-10-22 NOTE — Progress Notes (Signed)
° °  Subjective:    Patient ID: [name], female    DOB: 08/21/1960, 54 y.o.   MRN: [id number]  HPI   The callous is tender and the corn is a sharp pain, both in the left foot, going on for about 6 months gradually, mostly at work w/ walking and pressure aggravates It, been doing over the counter medication /dr. Schouls/ soaking    Review of Systems  Cardiovascular: Positive for palpitations.  Neurological: Positive for numbness and headaches.       Objective:   Physical Exam        Assessment & Plan:

## 2014-10-28 ENCOUNTER — Ambulatory Visit: Payer: Self-pay | Admitting: Neurology

## 2014-10-28 ENCOUNTER — Telehealth: Payer: Self-pay | Admitting: *Deleted

## 2014-10-28 MED ORDER — FINGOLIMOD HCL 0.5 MG PO CAPS: 0.5000 mg | ORAL_CAPSULE | Freq: Every day | ORAL | Status: DC

## 2014-10-28 NOTE — Telephone Encounter (Signed)
I have spoken with [name] this afternoon.  She was unable to leave work as planned for appt. today.  I have r/s appt. for her.  Sts. she has not missed any Gilenya doses, but rx. has expired.  In order to keep her from running out of Gilenya, and to avoid another fdo, per RAS, ok to r/f Gilenya until appt. on 6-22.  Rx. escribed to [facility] outpt pharmacy/[facility]

## 2014-10-29 ENCOUNTER — Encounter: Payer: Self-pay | Admitting: Neurology

## 2014-11-10 ENCOUNTER — Ambulatory Visit (INDEPENDENT_AMBULATORY_CARE_PROVIDER_SITE_OTHER): Payer: 59 | Admitting: Neurology

## 2014-11-10 ENCOUNTER — Encounter: Payer: Self-pay | Admitting: Neurology

## 2014-11-10 VITALS — BP 118/82 | HR 76 | Resp 16 | Ht 67.5 in | Wt 181.6 lb

## 2014-11-10 DIAGNOSIS — H469 Unspecified optic neuritis: Secondary | ICD-10-CM

## 2014-11-10 DIAGNOSIS — G35D Multiple sclerosis, unspecified: Secondary | ICD-10-CM

## 2014-11-10 DIAGNOSIS — F909 Attention-deficit hyperactivity disorder, unspecified type: Secondary | ICD-10-CM | POA: Diagnosis not present

## 2014-11-10 DIAGNOSIS — G35 Multiple sclerosis: Secondary | ICD-10-CM | POA: Diagnosis not present

## 2014-11-10 DIAGNOSIS — Z79899 Other long term (current) drug therapy: Secondary | ICD-10-CM

## 2014-11-10 DIAGNOSIS — R350 Frequency of micturition: Secondary | ICD-10-CM

## 2014-11-10 DIAGNOSIS — G5601 Carpal tunnel syndrome, right upper limb: Secondary | ICD-10-CM | POA: Diagnosis not present

## 2014-11-10 DIAGNOSIS — R260 Ataxic gait: Secondary | ICD-10-CM

## 2014-11-10 DIAGNOSIS — F988 Other specified behavioral and emotional disorders with onset usually occurring in childhood and adolescence: Secondary | ICD-10-CM

## 2014-11-10 NOTE — Progress Notes (Signed)
[facility]  PATIENT: [name] DOB: 25-Aug-1960  REFERRING DOCTOR OR PCP:  Andrena Mews SOURCE: patient and EMR records  _________________________________   HISTORICAL  CHIEF COMPLAINT:  Chief Complaint  Patient presents with  . Multiple Sclerosis    Sts. she continues to tolerate Gilenya well.  Sts. right hand numbness/pain is worse and sometimes wakes her from sleep.  Other than that, sts. everything else is about the same./fim    HISTORY OF PRESENT ILLNESS:  [name] is a 54 yo woman with MS diagnosed around 2000 after presenting with numbness in the hands and feet.  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 She was admitted to the hospital. She then was diagnosed with MS and is to see Dr. Jacqulynn Cadet at [facility]. He started her on Betaseron.   She did fairly well on Betaseron. She did have several small exacerbations and received some steroids a couple times. Often the exacerbations would be associated with headache as well. About 4 years ago, she opted to switch to Gilenya to have oral therapy for the MS. She has done very well on that medication without any exacerbations.  Her June 2015 MRI did show one focus that was not present on her MRI from March 2010.  Gait/strength/sensation:  Her gait is fine in open places but she will sometimes feel a bit off balance in tight places or on uneven surfaces. Strength is good in the arms and legs. More recently she has had more numbness and tingling in the right hand. Otherwise there is no numbness.  Bladder: She is doing much better with her urinary frequency and urgency since starting the combination of oxybutynin XR and Mytrbetriq 50 mg by mouth daily.  Vision:   She had complete recovery of her optic neuritis. However, she continues to have some light sensitivity in the right eye. She denies any difficulty with seeing colors or with resolution. There is no  diplopia.  Fatigue/sleep: She feels that her fatigue has done fairly well and does not bother her most days. She does not have any significant heat intolerance. She is sleeping well at night.  Mood/cognition: She has had some depression and anxiety but is doing very well on the combination of Prozac 20 mg and Xanax 0.5 mg as needed. She does not take it very often. She has not noted any significant problems with her cognition.   She does have decreased focus and attention deficit that has responded very well to Vyvanse and she will continue on that.  Headaches: She has had problems with headaches off and on over the years but is currently doing pretty well. The pain is always on the right and sometimes is associated with right occipital pain. There will sometimes be some nausea. When she gets a headache, she just takes over-the-counter NSAIDs.  Right arm numbness. She notes numbness with tingling at all times in the right hand.  All of the fingers seem to be involved. Sometimes, she will wake up in middle of the night with a much more intense pain in the hand. When she shakes her hand , pain will usually get better after a few minutes but not completely go away.   REVIEW OF SYSTEMS: Constitutional: No fevers, chills, sweats, or change in appetite Eyes: No visual changes, double vision, eye pain Ear, nose and throat: No hearing loss, ear pain, nasal congestion, sore throat Cardiovascular: No chest pain, palpitations Respiratory:  No shortness of breath at rest or with exertion.   No wheezes GastrointestinaI: No nausea, vomiting, diarrhea, abdominal pain, fecal incontinence Genitourinary: No dysuria, urinary retention or frequency.  No nocturia. Musculoskeletal: No neck pain, back pain.   Notes right hand pain/numbness Integumentary: No rash, pruritus, skin lesions Neurological: as above Psychiatric: No depression at this time.  No anxiety Endocrine: No palpitations, diaphoresis, change in  appetite, change in weigh or increased thirst Hematologic/Lymphatic: No anemia, purpura, petechiae. Allergic/Immunologic: No itchy/runny eyes, nasal congestion, recent allergic reactions, rashes  ALLERGIES: Allergies  Allergen Reactions  . Cephalexin Hives and Itching    HOME MEDICATIONS:  Current outpatient prescriptions:  .  ALPRAZolam (XANAX) 0.5 MG tablet, Take 1 tablet (0.5 mg total) by mouth at bedtime as needed. Behavioral health, Disp: 30 tablet, Rfl: 0 .  Fingolimod HCl (GILENYA) 0.5 MG CAPS, Take 1 capsule (0.5 mg total) by mouth daily. Dr. Felecia Shelling, Disp: 90 capsule, Rfl: 0 .  FLUoxetine (PROZAC) 20 MG tablet, Take 1 tablet (20 mg total) by mouth daily., Disp: 90 tablet, Rfl: 0 .  lisdexamfetamine (VYVANSE) 60 MG capsule, Take 1 capsule (60 mg total) by mouth every morning., Disp: 90 capsule, Rfl: 0 .  pantoprazole (PROTONIX) 40 MG tablet, Take 40 mg by mouth daily., Disp: , Rfl:   PAST MEDICAL HISTORY: Past Medical History  Diagnosis Date  . Arthritis   . GERD (gastroesophageal reflux disease)   . Mitral valve prolapse   . Multiple sclerosis   . Multiple sclerosis   . Cervical dysplasia   . ADD (attention deficit disorder with hyperactivity)     PAST SURGICAL HISTORY: Past Surgical History  Procedure Laterality Date  . Tubal ligation    . Back surgery    . Abdominal hysterectomy  2004    ovaries retained    FAMILY HISTORY: Family History  Problem Relation Age of Onset  . Depression Mother   . Stroke Mother   . Heart disease Mother   . Dementia Mother   . Cancer Neg Hx   . Diabetes Neg Hx     SOCIAL HISTORY:  History   Social History  . Marital Status: Divorced    Spouse Name: N/A  . Number of Children: N/A  . Years of Education: N/A   Occupational History  . Not on file.   Social History Main Topics  . Smoking status: Current Some Day Smoker -- 0.50 packs/day    Types: Cigarettes  . Smokeless tobacco: Not on file  . Alcohol Use: No  .  Drug Use: No  . Sexual Activity: Yes   Other Topics Concern  . Not on file   Social History Narrative     PHYSICAL EXAM  Filed Vitals:   11/10/14 1010  BP: 118/82  Pulse: 76  Resp: 16  Height: 5' 7.5" (1.715 m)  Weight: 181 lb 9.6 oz (82.373 kg)    Body mass index is 28.01 kg/(m^2).   General: The patient is well-developed and well-nourished and in no acute distress  Eyes:  Funduscopic exam shows normal optic discs and retinal vessels.  Neck: The neck is supple, no carotid bruits are noted.  The neck is nontender.  Cardiovascular: The heart has a regular rate and rhythm with a normal S1 and S2. There were no murmurs, gallops or rubs. Lungs are clear to auscultation.  Skin: Extremities are without significant edema.  Neurologic Exam  Mental status: The patient is alert and oriented x 3 at the time of the  examination. The patient has apparent normal recent and remote memory, with an apparently normal attention span and concentration ability.   Speech is normal.  Cranial nerves: Extraocular movements are full. Pupils are equal, round, and reactive to light and accomodation.  Visual fields are full.  Facial symmetry is present. There is good facial sensation to soft touch bilaterally.Facial strength is normal.  Trapezius and sternocleidomastoid strength is normal. No dysarthria is noted.  The tongue is midline, and the patient has symmetric elevation of the soft palate. No obvious hearing deficits are noted.  Motor:  Muscle bulk is normal.   Tone is normal. Strength is  5 / 5 in all 4 extremities except 4+/5 APB right  Sensory: Sensory testing is intact to pinprick, soft touch and vibration sensation in all 4 extremities.  Coordination: Cerebellar testing reveals good finger-nose-finger and heel-to-shin bilaterally.  Gait and station: Station is normal.   Gait is normal. Tandem gait is normal. Romberg is negative.   Reflexes: Deep tendon reflexes are symmetric and normal  bilaterally.   Plantar responses are flexor.    DIAGNOSTIC DATA (LABS, IMAGING, TESTING) - I reviewed patient records, labs, notes, testing and imaging myself where available.     ASSESSMENT AND PLAN  MS (multiple sclerosis) - Plan: Hepatic function panel, CBC with Differential/Platelet  Optic neuritis  Ataxic gait  High risk medication use - Plan: Hepatic function panel, CBC with Differential/Platelet  Urinary frequency  Right carpal tunnel syndrome  In summary, Kerissa Coia is a 54 year old woman with multiple sclerosis who is currently fairly stable on Gilenya therapy. Her last MRI showed1 focus not present previously but that could have occurred on either Betaseron or on Gilenya. We will check her white blood cell count with differential to make sure she does not have a very severe lymphopenia.   A second issue is probable right carpal tunnel syndrome. Symptoms seem mild to moderate now and I will have her try a splint. If she does not benefit from this, consider injections and if symptoms continue to worsen she may need to be checked with a NCV/EMG and have a referral for a carpal tunnel release.   Bladder function is doing very well on current medications and we will continue them.  She will return to see me in 6 months or sooner if she has new or worsening neurologic symptoms.  45 minute face-to-face visit with greater than one half the time counseling and coronary care about her MS and related symptoms  Richard A. Felecia Shelling, MD, PhD 11/17/5282, 13:24 AM Certified in Neurology, Clinical Neurophysiology, Sleep Medicine, Pain Medicine and Neuroimaging  [facility] [address] [place],  [address] [phone]

## 2014-11-11 LAB — HEPATIC FUNCTION PANEL
ALT: 19 IU/L (ref 0–32)
AST: 15 IU/L (ref 0–40)
Albumin: 4.2 g/dL (ref 3.5–5.5)
Alkaline Phosphatase: 99 IU/L (ref 39–117)
Bilirubin Total: 0.2 mg/dL (ref 0.0–1.2)
Bilirubin, Direct: 0.06 mg/dL (ref 0.00–0.40)
Total Protein: 6.9 g/dL (ref 6.0–8.5)

## 2014-11-11 LAB — CBC WITH DIFFERENTIAL/PLATELET
Basophils Absolute: 0.1 10*3/uL (ref 0.0–0.2)
Basos: 1 %
EOS (ABSOLUTE): 0.5 10*3/uL — ABNORMAL HIGH (ref 0.0–0.4)
Eos: 4 %
Hematocrit: 41.2 % (ref 34.0–46.6)
Hemoglobin: 12.9 g/dL (ref 11.1–15.9)
Immature Grans (Abs): 0 10*3/uL (ref 0.0–0.1)
Immature Granulocytes: 0 %
Lymphocytes Absolute: 4.3 10*3/uL — ABNORMAL HIGH (ref 0.7–3.1)
Lymphs: 39 %
MCH: 28 pg (ref 26.6–33.0)
MCHC: 31.3 g/dL — ABNORMAL LOW (ref 31.5–35.7)
MCV: 89 fL (ref 79–97)
Monocytes Absolute: 0.4 10*3/uL (ref 0.1–0.9)
Monocytes: 4 %
Neutrophils Absolute: 5.8 10*3/uL (ref 1.4–7.0)
Neutrophils: 52 %
Platelets: 324 10*3/uL (ref 150–379)
RBC: 4.61 x10E6/uL (ref 3.77–5.28)
RDW: 14.1 % (ref 12.3–15.4)
WBC: 11 10*3/uL — ABNORMAL HIGH (ref 3.4–10.8)

## 2014-11-12 ENCOUNTER — Telehealth: Payer: Self-pay | Admitting: *Deleted

## 2014-11-12 NOTE — Telephone Encounter (Signed)
-----   Message from Britt Bottom, MD sent at 11/11/2014  4:46 PM EDT ----- Labs are ok  ----   Usually with Gilenya the lymphocytes are low but her lymphocytes are normal --- is she taking the medication regularly?

## 2014-11-12 NOTE — Telephone Encounter (Signed)
LMTC./fim 

## 2014-11-24 NOTE — Telephone Encounter (Signed)
I have spoken with [name] and per RAS, since lymphocytes and lft's are normal, she may now increase Gilenya to daily instead of qod.  She verbalized understanding of same/fim

## 2014-11-24 NOTE — Telephone Encounter (Signed)
I have spoken with [name] this morning and per RAS, have advised that lymphocytes were normal--confirmed that she is taking Gilenya every other day--she missed 2-3 doses of Gilenya a few weeks ago when she had a gi bug, but has not recently missed doses/fim

## 2014-11-24 NOTE — Telephone Encounter (Signed)
-----   Message from Britt Bottom, MD sent at 11/11/2014  4:46 PM EDT ----- Labs are ok  ----   Usually with Gilenya the lymphocytes are low but her lymphocytes are normal --- is she taking the medication regularly?

## 2014-12-20 ENCOUNTER — Ambulatory Visit (HOSPITAL_COMMUNITY): Payer: Self-pay | Admitting: Psychiatry

## 2014-12-24 ENCOUNTER — Ambulatory Visit: Payer: 59 | Admitting: Podiatry

## 2014-12-30 ENCOUNTER — Ambulatory Visit (INDEPENDENT_AMBULATORY_CARE_PROVIDER_SITE_OTHER): Payer: 59 | Admitting: Podiatry

## 2014-12-30 ENCOUNTER — Encounter: Payer: Self-pay | Admitting: Podiatry

## 2014-12-30 VITALS — BP 110/69 | HR 97 | Resp 18

## 2014-12-30 DIAGNOSIS — B079 Viral wart, unspecified: Secondary | ICD-10-CM | POA: Diagnosis not present

## 2014-12-30 DIAGNOSIS — M79672 Pain in left foot: Secondary | ICD-10-CM | POA: Diagnosis not present

## 2014-12-30 DIAGNOSIS — B078 Other viral warts: Secondary | ICD-10-CM

## 2014-12-30 NOTE — Progress Notes (Signed)
Subjective:     Patient ID: [name], female   DOB: February 09, 1961, 54 y.o.   MRN: [id number]  HPI patient presents with a painful lesion plantar aspect left second metatarsal that was doing very well and then reoccurred in the last week   Review of Systems     Objective:   Physical Exam Neurovascular status was found to be intact with no change in health history and keratotic lesion second digit left that is painful when pressed and is making walking difficult    Assessment:     Possible verruca versus possible porokeratotic type lesion left    Plan:     Reviewed condition and pain she is experiencing and did deep debridement of lesion. I then applied chemical to this and order to try to create a immune response and hopefully reduce symptoms even though patient understands that ultimately this may require other types of treatments. Reappoint when needed

## 2015-01-03 ENCOUNTER — Telehealth: Payer: Self-pay | Admitting: *Deleted

## 2015-01-03 DIAGNOSIS — G35 Multiple sclerosis: Secondary | ICD-10-CM

## 2015-01-03 DIAGNOSIS — Z79899 Other long term (current) drug therapy: Secondary | ICD-10-CM

## 2015-01-03 NOTE — Telephone Encounter (Signed)
-----   Message from Britt Bottom, MD sent at 11/11/2014  4:46 PM EDT ----- Labs are ok  ----   Usually with Gilenya the lymphocytes are low but her lymphocytes are normal --- is she taking the medication regularly?

## 2015-01-03 NOTE — Telephone Encounter (Signed)
LMOM (identified vm) for [name] that it is time to repeat cbc with diff and lft's.  I have ordered those labs--she just needs to come by the office during office  hrs and let the front desk know she is  here for labwork./fim

## 2015-01-05 ENCOUNTER — Other Ambulatory Visit (INDEPENDENT_AMBULATORY_CARE_PROVIDER_SITE_OTHER): Payer: Self-pay

## 2015-01-05 DIAGNOSIS — G35 Multiple sclerosis: Secondary | ICD-10-CM

## 2015-01-05 DIAGNOSIS — Z79899 Other long term (current) drug therapy: Secondary | ICD-10-CM

## 2015-01-05 DIAGNOSIS — Z0289 Encounter for other administrative examinations: Secondary | ICD-10-CM

## 2015-01-06 LAB — HEPATIC FUNCTION PANEL
ALT: 39 IU/L — ABNORMAL HIGH (ref 0–32)
AST: 33 IU/L (ref 0–40)
Albumin: 4.1 g/dL (ref 3.5–5.5)
Alkaline Phosphatase: 110 IU/L (ref 39–117)
Bilirubin Total: 0.2 mg/dL (ref 0.0–1.2)
Bilirubin, Direct: 0.09 mg/dL (ref 0.00–0.40)
Total Protein: 6.4 g/dL (ref 6.0–8.5)

## 2015-01-06 LAB — CBC WITH DIFFERENTIAL/PLATELET
Basophils Absolute: 0.1 10*3/uL (ref 0.0–0.2)
Basos: 1 %
EOS (ABSOLUTE): 0.5 10*3/uL — ABNORMAL HIGH (ref 0.0–0.4)
Eos: 9 %
Hematocrit: 37.8 % (ref 34.0–46.6)
Hemoglobin: 12.3 g/dL (ref 11.1–15.9)
Immature Grans (Abs): 0 10*3/uL (ref 0.0–0.1)
Immature Granulocytes: 0 %
Lymphocytes Absolute: 1.1 10*3/uL (ref 0.7–3.1)
Lymphs: 18 %
MCH: 28.2 pg (ref 26.6–33.0)
MCHC: 32.5 g/dL (ref 31.5–35.7)
MCV: 87 fL (ref 79–97)
Monocytes Absolute: 0.7 10*3/uL (ref 0.1–0.9)
Monocytes: 13 %
Neutrophils Absolute: 3.5 10*3/uL (ref 1.4–7.0)
Neutrophils: 59 %
Platelets: 286 10*3/uL (ref 150–379)
RBC: 4.36 x10E6/uL (ref 3.77–5.28)
RDW: 14 % (ref 12.3–15.4)
WBC: 5.9 10*3/uL (ref 3.4–10.8)

## 2015-01-06 NOTE — Telephone Encounter (Signed)
LMOM (identified vm) that per RAS, labs are good; continue daily Gilenya.  She does not need to return this call unless she has questions/fim

## 2015-01-06 NOTE — Telephone Encounter (Signed)
-----   Message from Britt Bottom, MD sent at 01/06/2015  9:33 AM EDT ----- Labs look good. Continue to take the Gilenya daily

## 2015-01-17 ENCOUNTER — Ambulatory Visit (INDEPENDENT_AMBULATORY_CARE_PROVIDER_SITE_OTHER): Payer: 59 | Admitting: Psychiatry

## 2015-01-17 ENCOUNTER — Encounter (HOSPITAL_COMMUNITY): Payer: Self-pay | Admitting: Psychiatry

## 2015-01-17 VITALS — BP 128/84 | Ht 67.5 in | Wt 184.4 lb

## 2015-01-17 DIAGNOSIS — F988 Other specified behavioral and emotional disorders with onset usually occurring in childhood and adolescence: Secondary | ICD-10-CM

## 2015-01-17 DIAGNOSIS — F909 Attention-deficit hyperactivity disorder, unspecified type: Secondary | ICD-10-CM

## 2015-01-17 DIAGNOSIS — F329 Major depressive disorder, single episode, unspecified: Secondary | ICD-10-CM

## 2015-01-17 DIAGNOSIS — F32A Depression, unspecified: Secondary | ICD-10-CM

## 2015-01-17 MED ORDER — FLUOXETINE HCL 20 MG PO TABS: 20.0000 mg | ORAL_TABLET | Freq: Every day | ORAL | Status: DC

## 2015-01-17 MED ORDER — LISDEXAMFETAMINE DIMESYLATE 60 MG PO CAPS: 60.0000 mg | ORAL_CAPSULE | ORAL | Status: DC

## 2015-01-17 NOTE — Progress Notes (Signed)
[facility] Progress Note  [name] [id number] 54 y.o.  01/17/2015 4:09 PM  Chief Complaint:  Medication management and followup.    History of Present Illness: [name] came for her followup appointment.  She is compliant with Vyvanse and Prozac.  She has not taken Xanax in past few months.  Despite job is stressful she is doing very well.  She denies any irritability, anger, depressive symptoms.  Her sleep is good.  Recently she had blood work which is normal.  Her energy level is good.  She is happy that her MS is a stable .  She is able to do multitasking.  She denies any feeling of hopelessness or worthlessness.  Patient denies drinking or using any illegal substances.  Her appetite is okay.  Her vitals are stable.  She has no shakes, tremors or any EPS.  She has no chest pain or dizziness.  She is working as a Therapist, music in the hospital.    Suicidal Ideation: No Plan Formed: No Patient has means to carry out plan: No  Homicidal Ideation: No Plan Formed: No Patient has means to carry out plan: No  Review of Systems: Psychiatric: Agitation: No Hallucination: No Depressed Mood: No Insomnia: No Hypersomnia: No Altered Concentration: No Feels Worthless: No Grandiose Ideas: No Belief In Special Powers: No New/Increased Substance Abuse: No Compulsions: No  Neurologic: Headache: No Seizure: No Paresthesias: No  Medical History:  She has multiple sclerosis, mitral valve prolapse status post cardiac ablation, GERD, arthritis and history of migraine headaches.  Her neurologist is Dr. Felecia Shelling.  Outpatient Encounter Prescriptions as of 01/17/2015  Medication Sig  . ALPRAZolam (XANAX) 0.5 MG tablet Take 1 tablet (0.5 mg total) by mouth at bedtime as needed. Behavioral health  . Fingolimod HCl (GILENYA) 0.5 MG CAPS Take 1 capsule (0.5 mg total) by mouth daily. Dr. Felecia Shelling  . FLUoxetine (PROZAC) 20 MG tablet Take 1 tablet (20 mg total) by mouth daily.  [name]  lisdexamfetamine (VYVANSE) 60 MG capsule Take 1 capsule (60 mg total) by mouth every morning.  . pantoprazole (PROTONIX) 40 MG tablet Take 40 mg by mouth daily.  . [DISCONTINUED] FLUoxetine (PROZAC) 20 MG tablet Take 1 tablet (20 mg total) by mouth daily.  . [DISCONTINUED] lisdexamfetamine (VYVANSE) 60 MG capsule Take 1 capsule (60 mg total) by mouth every morning.   No facility-administered encounter medications on file as of 01/17/2015.    Past Psychiatric History/Hospitalization(s): Patient has history of depression started when she was in the process of divorce.  She was given Zoloft and then Prozac.  She also given Focalin, Ritalin and Strattera.  Patient denies any history of suicidal attempt or any psychosis. Anxiety: No Bipolar Disorder: No Depression: Yes Mania: No Psychosis: No Schizophrenia: No Personality Disorder: No Hospitalization for psychiatric illness: No History of Electroconvulsive Shock Therapy: No Prior Suicide Attempts: No  Recent Results (from the past 2160 hour(s))  Hepatic function panel     Status: None   Collection Time: 11/10/14 11:20 AM  Result Value Ref Range   Total Protein 6.9 6.0 - 8.5 g/dL   Albumin 4.2 3.5 - 5.5 g/dL   Bilirubin Total 0.2 0.0 - 1.2 mg/dL   Bilirubin, Direct 0.06 0.00 - 0.40 mg/dL   Alkaline Phosphatase 99 39 - 117 IU/L   AST 15 0 - 40 IU/L   ALT 19 0 - 32 IU/L  CBC with Differential/Platelet     Status: Abnormal   Collection Time: 11/10/14 11:20 AM  Result Value Ref Range   WBC 11.0 (H) 3.4 - 10.8 x10E3/uL   RBC 4.61 3.77 - 5.28 x10E6/uL   Hemoglobin 12.9 11.1 - 15.9 g/dL   Hematocrit 41.2 34.0 - 46.6 %   MCV 89 79 - 97 fL   MCH 28.0 26.6 - 33.0 pg   MCHC 31.3 (L) 31.5 - 35.7 g/dL   RDW 14.1 12.3 - 15.4 %   Platelets 324 150 - 379 x10E3/uL   Neutrophils 52 %   Lymphs 39 %   Monocytes 4 %   Eos 4 %   Basos 1 %   Neutrophils Absolute 5.8 1.4 - 7.0 x10E3/uL   Lymphocytes Absolute 4.3 (H) 0.7 - 3.1 x10E3/uL   Monocytes  Absolute 0.4 0.1 - 0.9 x10E3/uL   EOS (ABSOLUTE) 0.5 (H) 0.0 - 0.4 x10E3/uL   Basophils Absolute 0.1 0.0 - 0.2 x10E3/uL   Immature Granulocytes 0 %   Immature Grans (Abs) 0.0 0.0 - 0.1 x10E3/uL  CBC with Differential     Status: Abnormal   Collection Time: 01/05/15  4:13 PM  Result Value Ref Range   WBC 5.9 3.4 - 10.8 x10E3/uL   RBC 4.36 3.77 - 5.28 x10E6/uL   Hemoglobin 12.3 11.1 - 15.9 g/dL   Hematocrit 37.8 34.0 - 46.6 %   MCV 87 79 - 97 fL   MCH 28.2 26.6 - 33.0 pg   MCHC 32.5 31.5 - 35.7 g/dL   RDW 14.0 12.3 - 15.4 %   Platelets 286 150 - 379 x10E3/uL   Neutrophils 59 %   Lymphs 18 %   Monocytes 13 %   Eos 9 %   Basos 1 %   Neutrophils Absolute 3.5 1.4 - 7.0 x10E3/uL   Lymphocytes Absolute 1.1 0.7 - 3.1 x10E3/uL   Monocytes Absolute 0.7 0.1 - 0.9 x10E3/uL   EOS (ABSOLUTE) 0.5 (H) 0.0 - 0.4 x10E3/uL   Basophils Absolute 0.1 0.0 - 0.2 x10E3/uL   Immature Granulocytes 0 %   Immature Grans (Abs) 0.0 0.0 - 0.1 x10E3/uL  Hepatic Function Panel     Status: Abnormal   Collection Time: 01/05/15  4:13 PM  Result Value Ref Range   Total Protein 6.4 6.0 - 8.5 g/dL   Albumin 4.1 3.5 - 5.5 g/dL   Bilirubin Total 0.2 0.0 - 1.2 mg/dL   Bilirubin, Direct 0.09 0.00 - 0.40 mg/dL   Alkaline Phosphatase 110 39 - 117 IU/L   AST 33 0 - 40 IU/L   ALT 39 (H) 0 - 32 IU/L   Physical Exam: Constitutional:  BP 128/84 mmHg  Ht 5' 7.5" (1.715 m)  Wt 184 lb 6.4 oz (83.643 kg)  BMI 28.44 kg/m2  Musculoskeletal: Strength & Muscle Tone: within normal limits Gait & Station: normal Patient leans: N/A and Patient has a normal posture  Psychiatric Specialty Exam: Physical Exam  ROS  Blood pressure 128/84, height 5' 7.5" (1.715 m), weight 184 lb 6.4 oz (83.643 kg).Body mass index is 28.44 kg/(m^2).  General Appearance: Casual  Eye Contact::  Good  Speech:  Normal Rate  Volume:  Normal  Mood:  Euthymic  Affect:  Appropriate  Thought Process:  Coherent  Orientation:  Full (Time, Place, and  Person)  Thought Content:  WDL  Suicidal Thoughts:  No  Homicidal Thoughts:  No  Memory:  Immediate;   Fair Recent;   Good Remote;   Good  Judgement:  Good  Insight:  Good  Psychomotor Activity:  Normal  Concentration:  Good  Recall:  Good  Fund of Knowledge:  Good  Language:  Good  Akathisia:  No  Handed:  Right  AIMS (if indicated):     Assets:  Communication Skills Desire for Improvement Financial Resources/Insurance Housing Physical Health Social Support  ADL's:  Intact  Cognition:  WNL  Sleep:       Established Problem, Stable/Improving (1), Review of Psycho-Social Stressors (1), Decision to obtain old records (1), Review of Last Therapy Session (1) and Review of Medication Regimen & Side Effects (2)  Assessment: Axis I: ADD, depressive disorder NOS  Axis II: Deferred  Axis III: See medical history  Plan:  Patient is stable on Prozac 20 mg and Vyvanse 60 mg daily.  She has not taken Xanax in past 4 months.  Patient denies any side effects including any tremors, shakes or any EPS. Continue Prozac 20 mg and Vyvanse 60 mg daily.  Recommended to call us back if she has any question, concern or if she feels worsening of the symptom.  Follow-up in 3 months.   Shalan Neault T., MD 01/17/2015

## 2015-03-04 ENCOUNTER — Telehealth: Payer: Self-pay | Admitting: Neurology

## 2015-03-04 NOTE — Telephone Encounter (Signed)
Pt called and says she is running a fever and she is unable to move her neck.Pain is on the left for the most part when she tries to turn her head but she is unable to turn either side Its not muscular she says. She is wondering if she still needs to take her Gilenya. Also states that it is painfull swallowing. Pt request to call her back at work [phone]

## 2015-03-04 NOTE — Telephone Encounter (Signed)
I have spoken with [name] this morning.  RAS is out of the office this morning--per Dr. Leonie Man, I have adivsed ok to continue Gilenya/fim

## 2015-04-19 ENCOUNTER — Ambulatory Visit (HOSPITAL_COMMUNITY): Payer: Self-pay | Admitting: Psychiatry

## 2015-04-28 ENCOUNTER — Encounter (HOSPITAL_COMMUNITY): Payer: Self-pay | Admitting: Psychiatry

## 2015-04-28 ENCOUNTER — Ambulatory Visit (INDEPENDENT_AMBULATORY_CARE_PROVIDER_SITE_OTHER): Payer: 59 | Admitting: Psychiatry

## 2015-04-28 VITALS — BP 138/82 | HR 91 | Ht 68.0 in | Wt 187.0 lb

## 2015-04-28 DIAGNOSIS — F909 Attention-deficit hyperactivity disorder, unspecified type: Secondary | ICD-10-CM | POA: Diagnosis not present

## 2015-04-28 DIAGNOSIS — F329 Major depressive disorder, single episode, unspecified: Secondary | ICD-10-CM

## 2015-04-28 DIAGNOSIS — F988 Other specified behavioral and emotional disorders with onset usually occurring in childhood and adolescence: Secondary | ICD-10-CM

## 2015-04-28 DIAGNOSIS — F32A Depression, unspecified: Secondary | ICD-10-CM

## 2015-04-28 MED ORDER — FLUOXETINE HCL 20 MG PO TABS: 20.0000 mg | ORAL_TABLET | Freq: Every day | ORAL | Status: DC

## 2015-04-28 MED ORDER — LISDEXAMFETAMINE DIMESYLATE 60 MG PO CAPS: 60.0000 mg | ORAL_CAPSULE | ORAL | Status: DC

## 2015-04-28 NOTE — Progress Notes (Signed)
[place] [phone] Progress Note  [name] [id number] y.o.  04/28/2015 3:36 PM  Chief Complaint:  Medication management and followup.    History of Present Illness: [name] came for her followup appointment.  She is taking Vyvanse and Prozac as prescribed.  She has not taken Xanax in past 3 months.  She denies any irritability, anger, mood swing.  Her focus attention and depression is under control.  Recently she was given medicine for the flareup of MS.  Overall she is having her illness better.  She denies any crying spells, feeling of hopelessness or worthlessness.  She is able to do multitasking.  She is working as a Therapist, music in the hospital.  She denied any paranoia or any hallucination.  Her energy level is good. Her sleep is good.  Patient denies drinking or using any illegal substances.  Her appetite is okay and her vitals are stable.  Suicidal Ideation: No Plan Formed: No Patient has means to carry out plan: No  Homicidal Ideation: No Plan Formed: No Patient has means to carry out plan: No  Review of Systems: Psychiatric: Agitation: No Hallucination: No Depressed Mood: No Insomnia: No Hypersomnia: No Altered Concentration: No Feels Worthless: No Grandiose Ideas: No Belief In Special Powers: No New/Increased Substance Abuse: No Compulsions: No  Neurologic: Headache: No Seizure: No Paresthesias: No  Medical History:  She has multiple sclerosis, mitral valve prolapse status post cardiac ablation, GERD, arthritis and history of migraine headaches.  Her neurologist is Dr. Felecia Shelling.  Outpatient Encounter Prescriptions as of 04/28/2015  Medication Sig  . ALPRAZolam (XANAX) 0.5 MG tablet Take 1 tablet (0.5 mg total) by mouth at bedtime as needed. Behavioral health  . Fingolimod HCl (GILENYA) 0.5 MG CAPS Take 1 capsule (0.5 mg total) by mouth daily. Dr. Felecia Shelling  . FLUoxetine (PROZAC) 20 MG tablet Take 1 tablet (20 mg total) by mouth daily.  [name]  lisdexamfetamine (VYVANSE) 60 MG capsule Take 1 capsule (60 mg total) by mouth every morning.  . pantoprazole (PROTONIX) 40 MG tablet Take 40 mg by mouth daily.  . [DISCONTINUED] FLUoxetine (PROZAC) 20 MG tablet Take 1 tablet (20 mg total) by mouth daily.  . [DISCONTINUED] lisdexamfetamine (VYVANSE) 60 MG capsule Take 1 capsule (60 mg total) by mouth every morning.   No facility-administered encounter medications on file as of 04/28/2015.    Past Psychiatric History/Hospitalization(s): Patient has history of depression started when she was in the process of divorce.  She was given Zoloft and then Prozac.  She also given Focalin, Ritalin and Strattera.  Patient denies any history of suicidal attempt or any psychosis. Anxiety: No Bipolar Disorder: No Depression: Yes Mania: No Psychosis: No Schizophrenia: No Personality Disorder: No Hospitalization for psychiatric illness: No History of Electroconvulsive Shock Therapy: No Prior Suicide Attempts: No  No results found for this or any previous visit (from the past 2160 hour(s)). Physical Exam: Constitutional:  BP 138/82 mmHg  Pulse 91  Ht 5\' 8"  (1.727 m)  Wt 187 lb (84.823 kg)  BMI 28.44 kg/m2  Musculoskeletal: Strength & Muscle Tone: within normal limits Gait & Station: normal Patient leans: N/A and Patient has a normal posture  Psychiatric Specialty Exam: Physical Exam  ROS  Blood pressure 138/82, pulse 91, height 5\' 8"  (1.727 m), weight 187 lb (84.823 kg).Body mass index is 28.44 kg/(m^2).  General Appearance: Casual  Eye Contact::  Good  Speech:  Normal Rate  Volume:  Normal  Mood:  Euthymic  Affect:  Appropriate  Thought Process:  Coherent  Orientation:  Full (Time, Place, and Person)  Thought Content:  WDL  Suicidal Thoughts:  No  Homicidal Thoughts:  No  Memory:  Immediate;   Fair Recent;   Good Remote;   Good  Judgement:  Good  Insight:  Good  Psychomotor Activity:  Normal  Concentration:  Good  Recall:  Good   Fund of Knowledge:  Good  Language:  Good  Akathisia:  No  Handed:  Right  AIMS (if indicated):     Assets:  Communication Skills Desire for Improvement Financial Resources/Insurance Housing Physical Health Social Support  ADL's:  Intact  Cognition:  WNL  Sleep:       Established Problem, Stable/Improving (1), Review of Psycho-Social Stressors (1), Review of Last Therapy Session (1) and Review of Medication Regimen & Side Effects (2)  Assessment: Axis I: ADD, depressive disorder NOS  Axis II: Deferred  Axis III: See medical history  Plan:  Patient is stable on Prozac 20 mg and Vyvanse 60 mg daily.  She has not taken Xanax in past  3 months.   Patient denies any side effects including any tremors, shakes or any EPS. Continue Prozac 20 mg and Vyvanse 60 mg daily.  Recommended to call us back if she has any question, concern or if she feels worsening of the symptom.  Follow-up in 3 months.   Rylend Pietrzak T., MD 04/28/2015

## 2015-05-03 ENCOUNTER — Encounter: Payer: Self-pay | Admitting: Neurology

## 2015-05-03 ENCOUNTER — Ambulatory Visit (INDEPENDENT_AMBULATORY_CARE_PROVIDER_SITE_OTHER): Payer: 59 | Admitting: Neurology

## 2015-05-03 VITALS — BP 130/68 | HR 76 | Resp 16 | Ht 68.0 in | Wt 192.8 lb

## 2015-05-03 DIAGNOSIS — H469 Unspecified optic neuritis: Secondary | ICD-10-CM

## 2015-05-03 DIAGNOSIS — G35 Multiple sclerosis: Secondary | ICD-10-CM

## 2015-05-03 DIAGNOSIS — F909 Attention-deficit hyperactivity disorder, unspecified type: Secondary | ICD-10-CM | POA: Diagnosis not present

## 2015-05-03 DIAGNOSIS — R260 Ataxic gait: Secondary | ICD-10-CM

## 2015-05-03 DIAGNOSIS — G5601 Carpal tunnel syndrome, right upper limb: Secondary | ICD-10-CM | POA: Diagnosis not present

## 2015-05-03 DIAGNOSIS — Z79899 Other long term (current) drug therapy: Secondary | ICD-10-CM

## 2015-05-03 DIAGNOSIS — R350 Frequency of micturition: Secondary | ICD-10-CM

## 2015-05-03 DIAGNOSIS — M542 Cervicalgia: Secondary | ICD-10-CM | POA: Insufficient documentation

## 2015-05-03 DIAGNOSIS — F988 Other specified behavioral and emotional disorders with onset usually occurring in childhood and adolescence: Secondary | ICD-10-CM

## 2015-05-03 NOTE — Progress Notes (Signed)
[facility]  PATIENT: [name] DOB: 22-Jul-1960  REFERRING DOCTOR OR PCP:  Andrena Mews SOURCE: patient and EMR records  _________________________________  Nanine Means. Osamah Schmader M.D. PhD  Board certified in Neurology, Clinical Neurophysiology, Pain Medicine, and Sleep Medicine. HISTORICAL  CHIEF COMPLAINT:  Chief Complaint  Patient presents with  . Multiple Sclerosis    Sts. she continues to tolerate Gilenya well.  Sts. still has numbness/tingling in right hand that is unchanged.  She does not want to have an emg/ncs at this time/fim    HISTORY OF PRESENT ILLNESS:  [name] is a 54 yo woman with MS.   She is currently on Gilenya and tolerates it well.    She denies any exacerbation or new symptoms.     She last had blood work 12/2014 and lymphocyte count was 1.1.     Gait/strength/sensation:  Her balance is off in tight places or on uneven surfaces.   Gait is better on open spaces.   Strength is good in the arms and legs. More recently she has had more numbness and tingling in the right hand. Otherwise there is no numbness.  Bladder: She is doing much better with her urinary frequency and urgency since starting the combination of oxybutynin XR and Mytrbetriq 50 mg by mouth daily.  Vision:   She had complete recovery of her optic neuritis. However, she gets occasional blurry vision out of the right eye. She denies any difficulty with seeing colors.   VA was 20/30 either eye today.  There is no diplopia.    Right eye is more light sensitive  Fatigue/sleep: She is more tired working a lot of overtime. However, in general fatigue is the same.     She does not have any significant heat intolerance. She is sleeping well at night.  Mood/cognition: She has had some depression and anxiety but is doing very well on the combination of Prozac 20 mg and Xanax 0.5 mg as needed. She does not take it very often. She has not noted any significant problems with her cognition.    She does have decreased focus and attention deficit that has responded very well to Vyvanse and she will continue on that.  Headaches: these are worse, now 3-4 times a week.   She notes they worsened with more overtime last couplle months.   Odors trigger some migraines.  She has had problems with headaches off and on over the years but is currently doing pretty well. The pain is always on the right and sometimes is associated with right occipital pain. There will sometimes be some nausea. When she gets a headache, she just takes over-the-counter NSAIDs.  Right arm numbness. She notes numbness with tingling at all times in the right hand involving all of the fingers, palm > dorsum.    Nothing seems to increase or decrease the numbness.   Sometimes, she will wake up in middle of the night with more intense pain in the hand. When she shakes her hand , pain will usually get better after a few minutes but not completely go away.   She gets some benefit with a wrist splint but rarely uses it.      MS History:   She was diagnosed around 2000 after presenting with numbness in the hands and feet.  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 She was admitted to the hospital. She then was diagnosed with MS and  is to see Dr. Jacqulynn [facility] at [facility]. He started her on Betaseron.   She did fairly well on Betaseron. She did have several small exacerbations and received some steroids a couple times. Often the exacerbations would be associated with headache as well. In 2012, she opted to switch to Gilenya to have oral therapy for the MS. She has done very well on that medication without any exacerbations.  Her June 2015 MRI did showed only one focus that was not present on her MRI from March 2010.   REVIEW OF SYSTEMS: Constitutional: No fevers, chills, sweats, or change in appetite.   Notes some fatigue Eyes: No visual changes, double vision, eye pain Ear, nose and throat: No  hearing loss, ear pain, nasal congestion, sore throat Cardiovascular: No chest pain, palpitations Respiratory: No shortness of breath at rest or with exertion.   No wheezes GastrointestinaI: No nausea, vomiting, diarrhea, abdominal pain, fecal incontinence Genitourinary: Improve urinary frequency on medication. Musculoskeletal: No neck pain, back pain.   Notes right hand pain/numbness Integumentary: No rash, pruritus, skin lesions Neurological: as above Psychiatric: Depression and anxiety are doing well on medication. Endocrine: No palpitations, diaphoresis, change in appetite, change in weigh or increased thirst Hematologic/Lymphatic: No anemia, purpura, petechiae. Allergic/Immunologic: No itchy/runny eyes, nasal congestion, recent allergic reactions, rashes  ALLERGIES: Allergies  Allergen Reactions  . Cephalexin Hives and Itching    HOME MEDICATIONS:  Current outpatient prescriptions:  .  ALPRAZolam (XANAX) 0.5 MG tablet, Take 1 tablet (0.5 mg total) by mouth at bedtime as needed. Behavioral health, Disp: 30 tablet, Rfl: 0 .  Fingolimod HCl (GILENYA) 0.5 MG CAPS, Take 1 capsule (0.5 mg total) by mouth daily. Dr. Felecia Shelling, Disp: 90 capsule, Rfl: 0 .  FLUoxetine (PROZAC) 20 MG tablet, Take 1 tablet (20 mg total) by mouth daily., Disp: 90 tablet, Rfl: 0 .  lisdexamfetamine (VYVANSE) 60 MG capsule, Take 1 capsule (60 mg total) by mouth every morning., Disp: 90 capsule, Rfl: 0 .  mirabegron ER (MYRBETRIQ) 50 MG TB24 tablet, Take 50 mg by mouth daily., Disp: , Rfl:  .  oxybutynin (DITROPAN-XL) 10 MG 24 hr tablet, Take 10 mg by mouth daily., Disp: , Rfl:  .  pantoprazole (PROTONIX) 40 MG tablet, Take 40 mg by mouth daily., Disp: , Rfl:   PAST MEDICAL HISTORY: Past Medical History  Diagnosis Date  . Arthritis   . GERD (gastroesophageal reflux disease)   . Mitral valve prolapse   . Multiple sclerosis ([place])   . Multiple sclerosis ([place])   . Cervical dysplasia   . ADD (attention deficit  disorder with hyperactivity)     PAST SURGICAL HISTORY: Past Surgical History  Procedure Laterality Date  . Tubal ligation    . Back surgery    . Abdominal hysterectomy  2004    ovaries retained    FAMILY HISTORY: Family History  Problem Relation Age of Onset  . Depression Mother   . Stroke Mother   . Heart disease Mother   . Dementia Mother   . Cancer Neg Hx   . Diabetes Neg Hx     SOCIAL HISTORY:  Social History   Social History  . Marital Status: Divorced    Spouse Name: N/A  . Number of Children: N/A  . Years of Education: N/A   Occupational History  . Not on file.   Social History Main Topics  . Smoking status: Current Some Day Smoker -- 0.25 packs/day    Types: Cigarettes  . Smokeless tobacco: Never  Used  . Alcohol Use: No  . Drug Use: No  . Sexual Activity: Yes   Other Topics Concern  . Not on file   Social History Narrative     PHYSICAL EXAM  Filed Vitals:   05/03/15 1624  BP: 130/68  Pulse: 76  Resp: 16  Height: 5\' 8"  (1.727 m)  Weight: 192 lb 12.8 oz (87.454 kg)    Body mass index is 29.32 kg/(m^2).   General: The patient is well-developed and well-nourished and in no acute distress  Neck: Good ROM.  The neck is tender over the occiput.  Skin: Extremities are without significant edema.  Neurologic Exam  Mental status: The patient is alert and oriented x 3 at the time of the examination. The patient has apparent normal recent and remote memory, with an apparently normal attention span and concentration ability.   Speech is normal.  Cranial nerves: Extraocular movements are full. Pupils are equal, round, and reactive to light and accomodation.  Visual fields are full.  Facial symmetry is present. There is good facial sensation to soft touch bilaterally.Facial strength is normal.  Trapezius and sternocleidomastoid strength is normal. No dysarthria is noted.   No obvious hearing deficits are noted.  Motor:  Muscle bulk is normal.    Tone is normal. Strength is  5 / 5 in all 4 extremities except 4+/5 APB right  Sensory: Sensory testing is intact to pinprick, soft touch and vibration sensation in all 4 extremities.  Coordination: Cerebellar testing reveals good finger-nose-finger and heel-to-shin bilaterally.  Gait and station: Station is normal.   Gait is near normal. Tandem gait is mildly wide. Romberg is negative.   Reflexes: Deep tendon reflexes are symmetric and normal bilaterally.   Plantar responses are flexor.  She has a right Tinel's sign at the wrist.      DIAGNOSTIC DATA (LABS, IMAGING, TESTING) - I reviewed patient records, labs, notes, testing and imaging myself where available.     ASSESSMENT AND PLAN  MS (multiple sclerosis) (HCC)  Ataxic gait  High risk medication use  Urinary frequency  ADD (attention deficit disorder)  Optic neuritis  Right carpal tunnel syndrome  Neck pain on right side   1.   Continue Gilenya and other medications. We'll recheck her blood work at the next visit.    As there was only one new lesion over 5 years on the lat brain MRI, we can wait until next year to recheck the MRI.  2.   Trigger point injected right splenius capitis muscle with 80 mg Depo-Medrol in 3 mL lidocaine. She tolerated the procedure well. 3.  She will return to see me in 6 months or sooner if she has new or worsening neurologic symptoms.   Javeion Cannedy A. Felecia Shelling, MD, PhD [id number], [id number] PM Certified in Neurology, Clinical Neurophysiology, Sleep Medicine, Pain Medicine and Neuroimaging  [facility] [address], [place], [place] [address] [phone]

## 2015-05-11 ENCOUNTER — Telehealth: Payer: Self-pay | Admitting: Neurology

## 2015-05-11 NOTE — Telephone Encounter (Signed)
I have spoken with Claiborne Billings this afternoon.  She has had 2 skin cancers since starting Gilenya--would like to know if Gilenya is putting her at an increased risk for skin cancer.  Will check with RAS and call her back/fim

## 2015-05-11 NOTE — Telephone Encounter (Signed)
Pt called and wants to talk about side effects while taking Fingolimod HCl (GILENYA) 0.5 MG CAPS.Please call and advise [phone]

## 2015-05-12 NOTE — Telephone Encounter (Signed)
I have spoken with Claiborne Billings this morning and advised that per RAS, Gilenya may put her at an increased risk for basal cell skin cancers.  She clarified that the 3 skin cancers she has had removed in the last couple of mos. were squamous cell.  She sts. she really doesn't want to switch meds unless absolutely nec.  Will check with RAS when he returns to the office to see what he would like to do./fim

## 2015-05-17 ENCOUNTER — Ambulatory Visit: Payer: 59 | Admitting: Neurology

## 2015-05-24 NOTE — Telephone Encounter (Signed)
I have spoken with [name] this afternoon and per RAS, advised RAS would like to change her MS meds, as Gilenya has also been assoc. with squamous cell cancers. Since she has tolerated Gilenya well, and MS has been controlled on Gilenya, he would consider Aubagio  She is agreeable.  Appt. given for 4pm tomorrow/fim

## 2015-05-25 ENCOUNTER — Ambulatory Visit (INDEPENDENT_AMBULATORY_CARE_PROVIDER_SITE_OTHER): Payer: 59 | Admitting: Neurology

## 2015-05-25 ENCOUNTER — Encounter: Payer: Self-pay | Admitting: Neurology

## 2015-05-25 VITALS — BP 128/80 | HR 70 | Resp 16 | Ht 68.0 in | Wt 192.0 lb

## 2015-05-25 DIAGNOSIS — C801 Malignant (primary) neoplasm, unspecified: Secondary | ICD-10-CM

## 2015-05-25 DIAGNOSIS — Z79899 Other long term (current) drug therapy: Secondary | ICD-10-CM | POA: Diagnosis not present

## 2015-05-25 DIAGNOSIS — G35 Multiple sclerosis: Secondary | ICD-10-CM

## 2015-05-25 DIAGNOSIS — IMO0002 Reserved for concepts with insufficient information to code with codable children: Secondary | ICD-10-CM | POA: Insufficient documentation

## 2015-05-25 MED ORDER — TERIFLUNOMIDE 14 MG PO TABS: 14.0000 mg | ORAL_TABLET | Freq: Every day | ORAL | Status: DC

## 2015-05-25 NOTE — Progress Notes (Signed)
[facility]  PATIENT: [name] DOB: 07/16/1960  REFERRING DOCTOR OR PCP:  Andrena Mews SOURCE: patient and EMR records  _________________________________  Nanine Means. Murriel Eidem M.D. PhD  Board certified in Neurology, Clinical Neurophysiology, Pain Medicine, and Sleep Medicine. HISTORICAL  CHIEF COMPLAINT:  Chief Complaint  Patient presents with  . Multiple Sclerosis    On Gilenya, with recent hx. of skin cancer (2 spots on right leg, 1 spot right arm--squamous cell.)  Here to discuss other tx. options for MS/fim    HISTORY OF PRESENT ILLNESS:  [name] is a 55 yo woman with MS.   She is currently on Gilenya and tolerates it well.    She denies any exacerbation or new symptoms.     She last had blood work 12/2014 and lymphocyte count was 1.1.    Over the past few months she has developed to squamous cell skin cancers. Today, we had a long discussion about a possibility of Gilenya being a risk factor for her cancers.  During studies and in post marketing, basal and squamous cell cancer has been observed in some patients taking Gilenya. As she had a second skin cancer, I think it would be prudent to switch to a different medication.  We discussed options and she will switch from Gilenya to Aubagio. We discussed the pros and cons. She has had recent blood work. She has had a recent negative TB test at work.   She can stop Gilenya today and start Aubagio in about 3 or 4 weeks. I do not want her to completely washout from [place] before starting Aubagio as that may be associated with an increased risk of relapse.  She feels her MS has been stable. There is no new difficulty with gait, strength or sensation. No changes in bladder or vision.  MS History:   She was diagnosed around 2000 after presenting with numbness in the hands and feet.  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 She was admitted to the hospital. She then  was diagnosed with MS and is to see Dr. Jacqulynn Cadet at [facility]. He started her on Betaseron.   She did fairly well on Betaseron. She did have several small exacerbations and received some steroids a couple times. Often the exacerbations would be associated with headache as well. In 2012, she opted to switch to Gilenya to have oral therapy for the MS. She has done very well on that medication without any exacerbations.  Her June 2015 MRI did showed only one focus that was not present on her MRI from March 2010.   REVIEW OF SYSTEMS: Constitutional: No fevers, chills, sweats, or change in appetite.   Notes some fatigue Eyes: No visual changes, double vision, eye pain Ear, nose and throat: No hearing loss, ear pain, nasal congestion, sore throat Cardiovascular: No chest pain, palpitations Respiratory: No shortness of breath at rest or with exertion.   No wheezes GastrointestinaI: No nausea, vomiting, diarrhea, abdominal pain, fecal incontinence Genitourinary: Improve urinary frequency on medication. Musculoskeletal: No neck pain, back pain.   Notes right hand pain/numbness Integumentary: No rash, pruritus, skin lesions Neurological: as above Psychiatric: Depression and anxiety are doing well on medication. Endocrine: No palpitations, diaphoresis, change in appetite, change in weigh or increased thirst Hematologic/Lymphatic: No anemia, purpura, petechiae. Allergic/Immunologic: No itchy/runny eyes, nasal congestion, recent allergic reactions, rashes  ALLERGIES: Allergies  Allergen Reactions  . Cephalexin Hives and Itching  HOME MEDICATIONS:  Current outpatient prescriptions:  .  ALPRAZolam (XANAX) 0.5 MG tablet, Take 1 tablet (0.5 mg total) by mouth at bedtime as needed. Behavioral health, Disp: 30 tablet, Rfl: 0 .  Fingolimod HCl (GILENYA) 0.5 MG CAPS, Take 1 capsule (0.5 mg total) by mouth daily. Dr. Felecia Shelling, Disp: 90 capsule, Rfl: 0 .  FLUoxetine (PROZAC) 20 MG tablet, Take 1 tablet  (20 mg total) by mouth daily., Disp: 90 tablet, Rfl: 0 .  lisdexamfetamine (VYVANSE) 60 MG capsule, Take 1 capsule (60 mg total) by mouth every morning., Disp: 90 capsule, Rfl: 0 .  mirabegron ER (MYRBETRIQ) 50 MG TB24 tablet, Take 50 mg by mouth daily., Disp: , Rfl:  .  oxybutynin (DITROPAN-XL) 10 MG 24 hr tablet, Take 10 mg by mouth daily., Disp: , Rfl:  .  pantoprazole (PROTONIX) 40 MG tablet, Take 40 mg by mouth daily., Disp: , Rfl:   PAST MEDICAL HISTORY: Past Medical History  Diagnosis Date  . Arthritis   . GERD (gastroesophageal reflux disease)   . Mitral valve prolapse   . Multiple sclerosis ([place])   . Multiple sclerosis ([place])   . Cervical dysplasia   . ADD (attention deficit disorder with hyperactivity)     PAST SURGICAL HISTORY: Past Surgical History  Procedure Laterality Date  . Tubal ligation    . Back surgery    . Abdominal hysterectomy  2004    ovaries retained    FAMILY HISTORY: Family History  Problem Relation Age of Onset  . Depression Mother   . Stroke Mother   . Heart disease Mother   . Dementia Mother   . Cancer Neg Hx   . Diabetes Neg Hx     SOCIAL HISTORY:  Social History   Social History  . Marital Status: Divorced    Spouse Name: N/A  . Number of Children: N/A  . Years of Education: N/A   Occupational History  . Not on file.   Social History Main Topics  . Smoking status: Current Some Day Smoker -- 0.25 packs/day    Types: Cigarettes  . Smokeless tobacco: Never Used  . Alcohol Use: No  . Drug Use: No  . Sexual Activity: Yes   Other Topics Concern  . Not on file   Social History Narrative     PHYSICAL EXAM  Filed Vitals:   05/25/15 1612  BP: 128/80  Pulse: 70  Resp: 16  Height: 5\' 8"  (1.727 m)  Weight: 192 lb (87.091 kg)    Body mass index is 29.2 kg/(m^2).   General: The patient is well-developed and well-nourished and in no acute distress   Skin: Extremities are without significant edema.   She has 2 small sores  where her cancer was removed.  Neurologic Exam  Mental status: The patient is alert and oriented x 3 at the time of the examination. The patient has apparent normal recent and remote memory, with an apparently normal attention span and concentration ability.   Speech is normal.  Cranial nerves: Extraocular movements are full.  There is good facial sensation to soft touch bilaterally.Facial strength is normal.  Trapezius and sternocleidomastoid strength is normal. No dysarthria is noted.   No obvious hearing deficits are noted.  Motor:  Muscle bulk is normal.   Tone is normal. Strength is  5 / 5 in all 4 extremities except 4+/5 APB right  Sensory: Sensory testing is intact to pinprick, soft touch and vibration sensation in all 4 extremities.  Coordination: Cerebellar testing  reveals good finger-nose-finger and heel-to-shin bilaterally.  Gait and station: Station is normal.   Gait is near normal. Tandem gait is mildly wide.      DIAGNOSTIC DATA (LABS, IMAGING, TESTING) - I reviewed patient records, labs, notes, testing and imaging myself where available.     ASSESSMENT AND PLAN  MS (multiple sclerosis) ([place])  High risk medication use  Squamous cell carcinoma (HCC)   1.   Stop Gilenya. After 3 or 4 weeks, she will start Aubagio 40 mg by mouth daily. She understands that she will need to do monthly LFT testing for 6 months after she begins Aubagio. This will be set up for her. 2.  She will return to see me in 5-6 months or sooner if she has new or worsening neurologic symptoms.   Jasier Calabretta A. Felecia Shelling, MD, PhD [id number], [id number] PM Certified in Neurology, Clinical Neurophysiology, Sleep Medicine, Pain Medicine and Neuroimaging  [facility] [address] [place], [place] [address] [phone]

## 2015-05-26 ENCOUNTER — Encounter: Payer: Self-pay | Admitting: *Deleted

## 2015-06-02 ENCOUNTER — Telehealth: Payer: Self-pay

## 2015-06-02 DIAGNOSIS — K219 Gastro-esophageal reflux disease without esophagitis: Secondary | ICD-10-CM | POA: Diagnosis not present

## 2015-06-02 MED FILL — PANTOPRAZOLE SOD DR 40 MG T: 40 | 90 days supply | Qty: 90 | Fill #0

## 2015-06-02 NOTE — Telephone Encounter (Signed)
[employer] has approved the patient for Aubagio [employer]ist effective until 05/28/2016 ID: [id number]  Group: [id number]  BIN: [id number]

## 2015-06-09 ENCOUNTER — Telehealth: Payer: Self-pay | Admitting: Neurology

## 2015-06-09 MED ORDER — TERIFLUNOMIDE 14 MG PO TABS: 14.0000 mg | ORAL_TABLET | Freq: Every day | ORAL | Status: DC

## 2015-06-09 NOTE — Telephone Encounter (Signed)
Aubagio rx. escribed to [place] as requested/fim

## 2015-06-09 NOTE — Telephone Encounter (Signed)
Pt called [facility] her and related that Aubagio would need to go thru [facility]. Please send RX to [facility]

## 2015-06-10 ENCOUNTER — Telehealth: Payer: Self-pay

## 2015-06-10 NOTE — Telephone Encounter (Signed)
[place] ([employer] [facility]) has approved the request for coverage on Aubagio effective until [id number], or until policy changes or is terminated Ref # [id number]

## 2015-06-15 ENCOUNTER — Encounter: Payer: Self-pay | Admitting: *Deleted

## 2015-06-15 DIAGNOSIS — C44722 Squamous cell carcinoma of skin of right lower limb, including hip: Secondary | ICD-10-CM | POA: Diagnosis not present

## 2015-07-27 DIAGNOSIS — Z08 Encounter for follow-up examination after completed treatment for malignant neoplasm: Secondary | ICD-10-CM | POA: Diagnosis not present

## 2015-07-27 DIAGNOSIS — Z85828 Personal history of other malignant neoplasm of skin: Secondary | ICD-10-CM | POA: Diagnosis not present

## 2015-07-28 ENCOUNTER — Ambulatory Visit (INDEPENDENT_AMBULATORY_CARE_PROVIDER_SITE_OTHER): Payer: 59 | Admitting: Psychiatry

## 2015-07-28 ENCOUNTER — Encounter (HOSPITAL_COMMUNITY): Payer: Self-pay | Admitting: Psychiatry

## 2015-07-28 VITALS — BP 121/88 | HR 98 | Ht 67.5 in | Wt 187.8 lb

## 2015-07-28 DIAGNOSIS — F909 Attention-deficit hyperactivity disorder, unspecified type: Secondary | ICD-10-CM

## 2015-07-28 DIAGNOSIS — F988 Other specified behavioral and emotional disorders with onset usually occurring in childhood and adolescence: Secondary | ICD-10-CM

## 2015-07-28 DIAGNOSIS — F32A Depression, unspecified: Secondary | ICD-10-CM

## 2015-07-28 DIAGNOSIS — F329 Major depressive disorder, single episode, unspecified: Secondary | ICD-10-CM | POA: Diagnosis not present

## 2015-07-28 MED ORDER — LISDEXAMFETAMINE DIMESYLATE 60 MG PO CAPS: 60.0000 mg | ORAL_CAPSULE | ORAL | Status: DC

## 2015-07-28 MED ORDER — FLUOXETINE HCL 20 MG PO TABS: 20.0000 mg | ORAL_TABLET | Freq: Every day | ORAL | Status: DC

## 2015-07-28 MED FILL — FLUoxetine HCL 20 MG CAPS: 20 | 90 days supply | Qty: 90 | Fill #0

## 2015-07-28 NOTE — Progress Notes (Signed)
[place] [phone] Progress Note  LANICIA GOEDKEN AG:[id number] 55 y.o.  07/28/2015 4:24 PM  Chief Complaint:  Medication management and followup.    History of Present Illness: [name] came for her followup appointment.  She had a quiet Christmas.  She is taking her medication as prescribed.  Recently her neurologist change her medicine for multiple sclerosis .  She was told that previous medication can cause cancer.  She feel the medicine is working and there has been no side effects.  Patient is taking Vyvanse 60 mg daily and Prozac 20 mg daily.  Her energy level is good.  She denies any irritability, anger, mood swing.  She admitted job sometimes stressful as there are short off employees.  She is working in a radiology department.  Patient denies any paranoia, hallucination or any aggressive behavior.  She is social and active.  Her appetite is okay.  Her vitals are stable.  Patient denies drinking or using any illegal substances.  Suicidal Ideation: No Plan Formed: No Patient has means to carry out plan: No  Homicidal Ideation: No Plan Formed: No Patient has means to carry out plan: No  Review of Systems: Psychiatric: Agitation: No Hallucination: No Depressed Mood: No Insomnia: No Hypersomnia: No Altered Concentration: No Feels Worthless: No Grandiose Ideas: No Belief In Special Powers: No New/Increased Substance Abuse: No Compulsions: No  Neurologic: Headache: No Seizure: No Paresthesias: No  Medical History:  She has multiple sclerosis, mitral valve prolapse status post cardiac ablation, GERD, arthritis and history of migraine headaches.  Her neurologist is Dr. Felecia Shelling.  Outpatient Encounter Prescriptions as of 07/28/2015  Medication Sig  . ALPRAZolam (XANAX) 0.5 MG tablet Take 1 tablet (0.5 mg total) by mouth at bedtime as needed. Behavioral health  . FLUoxetine (PROZAC) 20 MG tablet Take 1 tablet (20 mg total) by mouth daily.  [name] lisdexamfetamine (VYVANSE) 60 MG  capsule Take 1 capsule (60 mg total) by mouth every morning.  . mirabegron ER (MYRBETRIQ) 50 MG TB24 tablet Take 50 mg by mouth daily.  [name] oxybutynin (DITROPAN-XL) 10 MG 24 hr tablet Take 10 mg by mouth daily.  . pantoprazole (PROTONIX) 40 MG tablet Take 40 mg by mouth daily.  . Teriflunomide (AUBAGIO) 14 MG TABS Take 14 mg by mouth daily.  . [DISCONTINUED] FLUoxetine (PROZAC) 20 MG tablet Take 1 tablet (20 mg total) by mouth daily.  . [DISCONTINUED] lisdexamfetamine (VYVANSE) 60 MG capsule Take 1 capsule (60 mg total) by mouth every morning.   No facility-administered encounter medications on file as of 07/28/2015.    Past Psychiatric History/Hospitalization(s): Patient has history of depression started when she was in the process of divorce.  She was given Zoloft and then Prozac.  She also given Focalin, Ritalin and Strattera.  Patient denies any history of suicidal attempt or any psychosis. Anxiety: No Bipolar Disorder: No Depression: Yes Mania: No Psychosis: No Schizophrenia: No Personality Disorder: No Hospitalization for psychiatric illness: No History of Electroconvulsive Shock Therapy: No Prior Suicide Attempts: No  No results found for this or any previous visit (from the past 2160 hour(s)). Physical Exam: Constitutional:  BP 121/88 mmHg  Pulse 98  Ht 5' 7.5" (1.715 m)  Wt 187 lb 12.8 oz (85.186 kg)  BMI 28.96 kg/m2  Musculoskeletal: Strength & Muscle Tone: within normal limits Gait & Station: normal Patient leans: N/A and Patient has a normal posture  Psychiatric Specialty Exam: Physical Exam  ROS  Blood pressure 121/88, pulse 98, height 5' 7.5" (1.715 m),  weight 187 lb 12.8 oz (85.186 kg).Body mass index is 28.96 kg/(m^2).  General Appearance: Casual  Eye Contact::  Good  Speech:  Normal Rate  Volume:  Normal  Mood:  Euthymic  Affect:  Appropriate  Thought Process:  Coherent  Orientation:  Full (Time, Place, and Person)  Thought Content:  WDL  Suicidal  Thoughts:  No  Homicidal Thoughts:  No  Memory:  Immediate;   Fair Recent;   Good Remote;   Good  Judgement:  Good  Insight:  Good  Psychomotor Activity:  Normal  Concentration:  Good  Recall:  Good  Fund of Knowledge:  Good  Language:  Good  Akathisia:  No  Handed:  Right  AIMS (if indicated):     Assets:  Communication Skills Desire for Improvement Financial Resources/Insurance Housing Physical Health Social Support  ADL's:  Intact  Cognition:  WNL  Sleep:       Established Problem, Stable/Improving (1), Review of Psycho-Social Stressors (1), Review of Last Therapy Session (1) and Review of Medication Regimen & Side Effects (2)  Assessment: Axis I: ADD, depressive disorder NOS  Axis II: Deferred  Axis III: See medical history  Plan:  Patient is stable on Prozac 20 mg and Vyvanse 60 mg daily.  She has not taken Xanax in past  3 months.   Patient denies any side effects including any tremors, shakes or any EPS. Continue Prozac 20 mg and Vyvanse 60 mg daily.  Recommended to call us back if she has any question, concern or if she feels worsening of the symptom.  Follow-up in 3 months.   Isla Sabree T., MD 07/28/2015

## 2015-07-29 MED FILL — VYVANSE 60 MG CAPSULE: 60 | 90 days supply | Qty: 90 | Fill #0

## 2015-08-15 ENCOUNTER — Other Ambulatory Visit: Payer: Self-pay | Admitting: Neurology

## 2015-08-15 ENCOUNTER — Other Ambulatory Visit (INDEPENDENT_AMBULATORY_CARE_PROVIDER_SITE_OTHER): Payer: Self-pay

## 2015-08-15 DIAGNOSIS — G35 Multiple sclerosis: Secondary | ICD-10-CM | POA: Diagnosis not present

## 2015-08-15 DIAGNOSIS — IMO0002 Reserved for concepts with insufficient information to code with codable children: Secondary | ICD-10-CM

## 2015-08-15 DIAGNOSIS — Z0289 Encounter for other administrative examinations: Secondary | ICD-10-CM

## 2015-08-15 DIAGNOSIS — Z79899 Other long term (current) drug therapy: Secondary | ICD-10-CM

## 2015-08-16 LAB — COMPREHENSIVE METABOLIC PANEL
ALT: 24 IU/L (ref 0–32)
AST: 16 IU/L (ref 0–40)
Albumin/Globulin Ratio: 1.7 (ref 1.2–2.2)
Albumin: 4.1 g/dL (ref 3.5–5.5)
Alkaline Phosphatase: 95 IU/L (ref 39–117)
BUN/Creatinine Ratio: 35 — ABNORMAL HIGH (ref 9–23)
BUN: 19 mg/dL (ref 6–24)
Bilirubin Total: <0.2 mg/dL (ref 0.0–1.2)
CO2: 26 mmol/L (ref 18–29)
Calcium: 9.2 mg/dL (ref 8.7–10.2)
Chloride: 101 mmol/L (ref 96–106)
Creatinine, Ser: 0.54 mg/dL — ABNORMAL LOW (ref 0.57–1.00)
GFR calc Af Amer: 124 mL/min/{1.73_m2} (ref >59)
GFR calc non Af Amer: 107 mL/min/{1.73_m2} (ref >59)
Globulin, Total: 2.4 g/dL (ref 1.5–4.5)
Glucose: 90 mg/dL (ref 65–99)
Potassium: 4.7 mmol/L (ref 3.5–5.2)
Sodium: 141 mmol/L (ref 134–144)
Total Protein: 6.5 g/dL (ref 6.0–8.5)

## 2015-08-17 ENCOUNTER — Telehealth: Payer: Self-pay | Admitting: *Deleted

## 2015-08-17 NOTE — Telephone Encounter (Signed)
-----   Message from Britt Bottom, MD sent at 08/17/2015  8:22 AM EDT ----- Please let her know labs look good

## 2015-08-17 NOTE — Telephone Encounter (Signed)
LMOM (identified vm) that per RAS, labwork looks good.  She verbalized understanding of same/fim

## 2015-08-24 DIAGNOSIS — L820 Inflamed seborrheic keratosis: Secondary | ICD-10-CM | POA: Diagnosis not present

## 2015-08-24 DIAGNOSIS — C44722 Squamous cell carcinoma of skin of right lower limb, including hip: Secondary | ICD-10-CM | POA: Diagnosis not present

## 2015-08-30 ENCOUNTER — Ambulatory Visit (INDEPENDENT_AMBULATORY_CARE_PROVIDER_SITE_OTHER): Payer: 59 | Admitting: Family Medicine

## 2015-08-30 ENCOUNTER — Encounter: Payer: Self-pay | Admitting: Family Medicine

## 2015-08-30 VITALS — BP 127/81 | HR 75 | Temp 98.6°F | Ht 67.0 in | Wt 189.0 lb

## 2015-08-30 DIAGNOSIS — M545 Low back pain, unspecified: Secondary | ICD-10-CM

## 2015-08-30 LAB — POCT URINALYSIS DIPSTICK
Bilirubin, UA: NEGATIVE
Blood, UA: NEGATIVE
Glucose, UA: NEGATIVE
Leukocytes, UA: NEGATIVE
Nitrite, UA: NEGATIVE
Spec Grav, UA: 1.02
Urobilinogen, UA: 1
pH, UA: 7.5

## 2015-08-30 MED ORDER — CYCLOBENZAPRINE HCL 5 MG PO TABS: 5.0000 mg | ORAL_TABLET | Freq: Three times a day (TID) | ORAL | Status: DC | PRN

## 2015-08-30 MED FILL — CYCLOBENZAPRINE 5 MG TABLET: 5 | 10 days supply | Qty: 30 | Fill #0

## 2015-08-30 NOTE — Progress Notes (Signed)
Patient ID: [name], female   DOB: 1960/11/02, 55 y.o.   MRN: [id number]   [facility] Aquilla Hacker, MD Phone: [phone]  Subjective:   # Left Low Back Pain - pt. Thinks she may have a UTI - No frequency, no urgency.  - No dysuria, no hematuria.  - Says she has felt a little warm, and has had some subjective decreased appetite for a couple of days.  - All of these symptoms started about 3-4 days ago.  - No sharp pain in her back.  - She has never had a UTI before.  - has some mild nausea, but no vomiting.  - Has not had any tenderness to palpation.  - She says moving certain ways will make it worse but is mostly unsuccessful to reproduce it.  - She has never hurt her back in the past, and does not remember recently lifting anything heavy. 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  - No suprapubic pain.  - No urine color or smell change.   All relevant systems were reviewed and were negative unless otherwise noted in the HPI  Past Medical History Reviewed problem list.  Medications- reviewed and updated Current Outpatient Prescriptions  Medication Sig Dispense Refill  . ALPRAZolam (XANAX) 0.5 MG tablet Take 1 tablet (0.5 mg total) by mouth at bedtime as needed. Behavioral health 30 tablet 0  . FLUoxetine (PROZAC) 20 MG tablet Take 1 tablet (20 mg total) by mouth daily. 90 tablet 0  . lisdexamfetamine (VYVANSE) 60 MG capsule Take 1 capsule (60 mg total) by mouth every morning. 90 capsule 0  . mirabegron ER (MYRBETRIQ) 50 MG TB24 tablet Take 50 mg by mouth daily.    [name] oxybutynin (DITROPAN-XL) 10 MG 24 hr tablet Take 10 mg by mouth daily.    . pantoprazole (PROTONIX) 40 MG tablet Take 40 mg by mouth daily.    . Teriflunomide (AUBAGIO) 14 MG TABS Take 14 mg by mouth daily. 30 tablet 11   No current facility-administered medications for this visit.   Chief complaint-noted No additions to family  history Social history- patient is a non smoker  Objective: BP 127/81 mmHg  Pulse 75  Temp(Src) 98.6 F (37 C) (Oral)  Ht 5\' 7"  (1.702 m)  Wt 189 lb (85.73 kg)  BMI 29.59 kg/m2 Gen: NAD, alert, cooperative with exam HEENT: NCAT, EOMI, PERRL Neck: FROM, supple CV: RRR, good S1/S2, no murmur Resp: CTABL, no wheezes, non-labored Abd: SNTND, BS present, no guarding or organomegaly, no CVA tenderness.  Ext: No edema, warm, normal tone, moves UE/LE spontaneously Back - nontender to palpation in thoracic or lumbar spine. No paraspinous tenderness. No pain with flexion or extension of the spine.  Neuro: Alert and oriented, No gross deficits Skin: no rashes no lesions  Assessment/Plan:  # Left Low Back pain - unclear etiology. Symptoms not completely consistent with either UTI or Muscle strain, but may be muscle strain given that movement makes it worse. U/A negative today. Recent CMET completely normal.  - Conservative initial therapy.  - NSAID's, Flexeril for muscle spasm - Follow up if not improving or worse in the next 2 weeks.  - May need further imagine if fullness / pain remains without a clear etiology.

## 2015-08-30 NOTE — Patient Instructions (Signed)
Thanks for coming in today.   Your urinalysis did not show any evidence of infection.   We will start with treating you with a muscle relaxer and ibuprofen.   Take the ibuprofen at work and the muscle relaxer at home as it will make you a little sleepy.   If your symptoms do not improve in the next 1-2 weeks, then follow up with your primary doctor for further evaluation.   If you have any questions or concerns in the meantime, don't hesitate to give [place] a call.   Thanks for letting us take care of you.   Sincerely, Paula Compton, MD Family Medicine - PGY 2

## 2015-09-12 MED FILL — PANTOPRAZOLE SOD DR 40 MG T: 40 | 90 days supply | Qty: 90 | Fill #1

## 2015-09-22 ENCOUNTER — Encounter: Payer: Self-pay | Admitting: Neurology

## 2015-09-22 ENCOUNTER — Ambulatory Visit (INDEPENDENT_AMBULATORY_CARE_PROVIDER_SITE_OTHER): Payer: 59 | Admitting: Neurology

## 2015-09-22 ENCOUNTER — Telehealth: Payer: Self-pay | Admitting: Neurology

## 2015-09-22 VITALS — BP 136/82 | HR 82 | Resp 16 | Ht 67.0 in | Wt 184.5 lb

## 2015-09-22 DIAGNOSIS — R350 Frequency of micturition: Secondary | ICD-10-CM | POA: Diagnosis not present

## 2015-09-22 DIAGNOSIS — R260 Ataxic gait: Secondary | ICD-10-CM | POA: Diagnosis not present

## 2015-09-22 DIAGNOSIS — G35 Multiple sclerosis: Secondary | ICD-10-CM

## 2015-09-22 DIAGNOSIS — Z79899 Other long term (current) drug therapy: Secondary | ICD-10-CM

## 2015-09-22 DIAGNOSIS — H469 Unspecified optic neuritis: Secondary | ICD-10-CM | POA: Diagnosis not present

## 2015-09-22 MED ORDER — CHOLESTYRAMINE 4 G PO PACK: 8.0000 g | PACK | Freq: Three times a day (TID) | ORAL | Status: DC

## 2015-09-22 MED FILL — CHOLESTYRAMINE PACKET: 4 | 10 days supply | Qty: 60 | Fill #0

## 2015-09-22 NOTE — Telephone Encounter (Signed)
I have spoken with [name] this afternoon--she has had another skin ca removed--would like to discuss d/c'ing Aubagio, what other MS meds would be good options for her with her  hx. of skin ca/fim

## 2015-09-22 NOTE — Telephone Encounter (Signed)
Patient is calling. The patient is having issues with Teriflunomide (AUBAGIO) 14 MG TABS and she needs to discuss.

## 2015-09-22 NOTE — Progress Notes (Signed)
[facility]  PATIENT: [name] DOB: [birth year]  REFERRING DOCTOR OR PCP:  Andrena Mews SOURCE: patient and EMR records  _________________________________   HISTORICAL  CHIEF COMPLAINT:  No chief complaint on file.   HISTORY OF PRESENT ILLNESS:  [name] is a 55 yo woman with MS.   She is currently on Gilenya and tolerates it well.    She denies any exacerbation or new symptoms.     She last had blood work 12/2014 and lymphocyte count was 1.1.    Over the past few months she has developed to squamous cell skin cancers.     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     Because in post marketing, basal and squamous cell cancer has been observed in some patients taking Gilenya, we changed to Aubagio.    Aubagio has less effect on immune system than Gilenya but due to needing third operation, she would prefer to eliminate any medication negatively affecting her immune system.     She feels her MS has been stable. There is no new difficulty with gait, strength or sensation. No changes in bladder or vision.        MS History:   She was diagnosed around 2000 after presenting with numbness in the hands and feet.  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 She was admitted to the hospital. She then was diagnosed with MS and is to see Dr. Jacqulynn Cadet at [facility]. He started her on Betaseron.   She did fairly well on Betaseron. She did have several small exacerbations and received some steroids a couple times. Often the exacerbations would be associated with headache as well. In 2012, she opted to switch to Gilenya to have oral therapy for the MS. She has done very well on that medication without any exacerbations.  Her June 2015 MRI did showed only one focus that was not present on her MRI from March 2010.   REVIEW OF SYSTEMS: Constitutional: No  fevers, chills, sweats, or change in appetite.   Notes some fatigue Eyes: No visual changes, double vision, eye pain Ear, nose and throat: No hearing loss, ear pain, nasal congestion, sore throat Cardiovascular: No chest pain, palpitations Respiratory: No shortness of breath at rest or with exertion.   No wheezes GastrointestinaI: No nausea, vomiting, diarrhea, abdominal pain, fecal incontinence Genitourinary: Improve urinary frequency on medication. Musculoskeletal: No neck pain, back pain.   Notes right hand pain/numbness Integumentary: No rash, pruritus, skin lesions Neurological: as above Psychiatric: Depression and anxiety are doing well on medication. Endocrine: No palpitations, diaphoresis, change in appetite, change in weigh or increased thirst Hematologic/Lymphatic: No anemia, purpura, petechiae. Allergic/Immunologic: No itchy/runny eyes, nasal congestion, recent allergic reactions, rashes  ALLERGIES: Allergies  Allergen Reactions  . Cephalexin Hives and Itching    HOME MEDICATIONS:  Current outpatient prescriptions:  .  ALPRAZolam (XANAX) 0.5 MG tablet, Take 1 tablet (0.5 mg total) by mouth at bedtime as needed. Behavioral health, Disp: 30 tablet, Rfl: 0 .  cyclobenzaprine (FLEXERIL) 5 MG tablet, Take 1 tablet (5 mg total) by mouth 3 (three) times daily as needed for muscle spasms., Disp: 30 tablet, Rfl: 1 .  FLUoxetine (PROZAC) 20 MG tablet, Take 1 tablet (20 mg total) by mouth daily., Disp: 90 tablet, Rfl: 0 .  lisdexamfetamine (VYVANSE) 60 MG capsule,  Take 1 capsule (60 mg total) by mouth every morning., Disp: 90 capsule, Rfl: 0 .  mirabegron ER (MYRBETRIQ) 50 MG TB24 tablet, Take 50 mg by mouth daily., Disp: , Rfl:  .  oxybutynin (DITROPAN-XL) 10 MG 24 hr tablet, Take 10 mg by mouth daily., Disp: , Rfl:  .  pantoprazole (PROTONIX) 40 MG tablet, Take 40 mg by mouth daily., Disp: , Rfl:  .  Teriflunomide (AUBAGIO) 14 MG TABS, Take 14 mg by mouth daily., Disp: 30 tablet,  Rfl: 11  PAST MEDICAL HISTORY: Past Medical History  Diagnosis Date  . Arthritis   . GERD (gastroesophageal reflux disease)   . Mitral valve prolapse   . Multiple sclerosis ([place])   . Multiple sclerosis ([place])   . Cervical dysplasia   . ADD (attention deficit disorder with hyperactivity)     PAST SURGICAL HISTORY: Past Surgical History  Procedure Laterality Date  . Tubal ligation    . Back surgery    . Abdominal hysterectomy  2004    ovaries retained    FAMILY HISTORY: Family History  Problem Relation Age of Onset  . Depression Mother   . Stroke Mother   . Heart disease Mother   . Dementia Mother   . Cancer Neg Hx   . Diabetes Neg Hx     SOCIAL HISTORY:  Social History   Social History  . Marital Status: Divorced    Spouse Name: N/A  . Number of Children: N/A  . Years of Education: N/A   Occupational History  . Not on file.   Social History Main Topics  . Smoking status: Current Some Day Smoker -- 0.25 packs/day    Types: Cigarettes  . Smokeless tobacco: Never Used  . Alcohol Use: No  . Drug Use: No  . Sexual Activity: Yes   Other Topics Concern  . Not on file   Social History Narrative     PHYSICAL EXAM  There were no vitals filed for this visit.  There is no weight on file to calculate BMI.   General: The patient is well-developed and well-nourished and in no acute distress   Skin: Extremities are without significant edema.   She has large open wound in leg where her cancer was removed.  Well healed sore right arm  Neurologic Exam  Mental status: The patient is alert and oriented x 3 at the time of the examination. The patient has apparent normal recent and remote memory, with an apparently normal attention span and concentration ability.   Speech is normal.  Cranial nerves: Extraocular movements are full.  There is good facial sensation to soft touch bilaterally.Facial strength is normal.  Trapezius and sternocleidomastoid strength is  normal. No dysarthria is noted.   No obvious hearing deficits are noted.  Motor:  Muscle bulk is normal.   Tone is normal. Strength is  5 / 5 in all 4 extremities except 4+/5 APB right  Sensory: Sensory testing is intact to pinprick, soft touch and vibration sensation in all 4 extremities.  Coordination: Cerebellar testing reveals good finger-nose-finger and heel-to-shin bilaterally.  Gait and station: Station is normal.   Gait is near normal. Tandem gait is mildly wide.      DIAGNOSTIC DATA (LABS, IMAGING, TESTING) - I reviewed patient records, labs, notes, testing and imaging myself where available.     ASSESSMENT AND PLAN  MS (multiple sclerosis) (HCC)  Optic neuritis  Ataxic gait  High risk medication use  Urinary frequency    1.  Stop Aubagio.   Washout with cholestyramine to remove more rapidly.    Start Copaxone (signed SRF) 2.  She will return to see me in 5-6 months or sooner if she has new or worsening neurologic symptoms.  30 minute face to face with > 1/2 the time counseling and coordinating care about disease modifying therapies for MS and possible role immune system plays in cancer and reviewing therapeutic options   Jazaria Jarecki A. Felecia Shelling, MD, PhD [id number], [id number] PM Certified in Neurology, Clinical Neurophysiology, Sleep Medicine, Pain Medicine and Neuroimaging  [facility] [address] [place], [place] [address] [phone]

## 2015-09-23 ENCOUNTER — Encounter: Payer: Self-pay | Admitting: *Deleted

## 2015-09-27 ENCOUNTER — Telehealth: Payer: Self-pay | Admitting: Neurology

## 2015-09-27 MED ORDER — GLATIRAMER ACETATE 40 MG/ML ~~LOC~~ SOSY: 40.0000 mg | PREFILLED_SYRINGE | SUBCUTANEOUS | Status: DC

## 2015-09-27 NOTE — Telephone Encounter (Signed)
I have spoken with [employer]--have been told that [facility] will not accept Copaxone rx. from them--I have to fax.  Rx. for Copaxone escribed to [facility] as requested/fim

## 2015-09-27 NOTE — Telephone Encounter (Signed)
[facility]/[employer] pt was supposed to use [facility] specialty pharm. However, unable to refer. Please call back [phone] Answering service took call.

## 2015-10-13 MED FILL — COPAXONE 40 MG/ML SOSY: 40 | 28 days supply | Qty: 12 | Fill #0

## 2015-10-26 DIAGNOSIS — C44711 Basal cell carcinoma of skin of unspecified lower limb, including hip: Secondary | ICD-10-CM | POA: Diagnosis not present

## 2015-11-01 ENCOUNTER — Ambulatory Visit (INDEPENDENT_AMBULATORY_CARE_PROVIDER_SITE_OTHER): Payer: 59 | Admitting: Psychiatry

## 2015-11-01 ENCOUNTER — Encounter (HOSPITAL_COMMUNITY): Payer: Self-pay | Admitting: Psychiatry

## 2015-11-01 VITALS — BP 112/78 | HR 86 | Ht 67.5 in | Wt 180.8 lb

## 2015-11-01 DIAGNOSIS — F32A Depression, unspecified: Secondary | ICD-10-CM

## 2015-11-01 DIAGNOSIS — F329 Major depressive disorder, single episode, unspecified: Secondary | ICD-10-CM

## 2015-11-01 DIAGNOSIS — F909 Attention-deficit hyperactivity disorder, unspecified type: Secondary | ICD-10-CM | POA: Diagnosis not present

## 2015-11-01 DIAGNOSIS — F988 Other specified behavioral and emotional disorders with onset usually occurring in childhood and adolescence: Secondary | ICD-10-CM

## 2015-11-01 MED ORDER — FLUOXETINE HCL 20 MG PO TABS: 20.0000 mg | ORAL_TABLET | Freq: Every day | ORAL | Status: DC

## 2015-11-01 MED ORDER — LISDEXAMFETAMINE DIMESYLATE 60 MG PO CAPS: 60.0000 mg | ORAL_CAPSULE | ORAL | Status: DC

## 2015-11-01 MED FILL — FLUoxetine HCL 20 MG CAPS: 20 | 90 days supply | Qty: 90 | Fill #0

## 2015-11-01 MED FILL — VYVANSE 60 MG CAPSULE: 60 | 90 days supply | Qty: 90 | Fill #0

## 2015-11-01 NOTE — Progress Notes (Signed)
[facility] [phone] Progress Note  [name] [id number] 55 y.o.  11/01/2015 4:24 PM  Chief Complaint:  Medication management and followup.    History of Present Illness: [name] came for her followup appointment.  She is taking Prozac and Vyvanse.  He described her mood is stable.  Recently she's seen her neurologist and her medicine for MS is adjusted.  She is happy that her MS is stable.  She denies any irritability, anger, mood swing.  She admitted having crying spells when she visited her mother in nursing home.  Her mother is in nursing home for past 8 years .  Her mother has dementia .  Patient reported no side effects from Prozac and Vyvanse.  She has not taken Xanax in a while and she denies any major panic attack in recent months.  She has no tremors, shakes or any EPS.  Her sleep is good.  Her energy level is okay.  She admitted job is busy and stressful but she is handling much better.  Her appetite is okay.  Patient denies drinking or using any illegal substances.  Patient denies any paranoia, hallucination, suicidal thoughts or any feeling of hopelessness.  Suicidal Ideation: No Plan Formed: No Patient has means to carry out plan: No  Homicidal Ideation: No Plan Formed: No Patient has means to carry out plan: No  Review of Systems: Psychiatric: Agitation: No Hallucination: No Depressed Mood: No Insomnia: No Hypersomnia: No Altered Concentration: No Feels Worthless: No Grandiose Ideas: No Belief In Special Powers: No New/Increased Substance Abuse: No Compulsions: No  Neurologic: Headache: No Seizure: No Paresthesias: No  Medical History:  She has multiple sclerosis, mitral valve prolapse status post cardiac ablation, GERD, arthritis and history of migraine headaches.  Her neurologist is Dr. Felecia Shelling.  Outpatient Encounter Prescriptions as of 11/01/2015  Medication Sig  . ALPRAZolam (XANAX) 0.5 MG tablet Take 1 tablet (0.5 mg total) by mouth at bedtime  as needed. Behavioral health  . cholestyramine (QUESTRAN) 4 g packet Take 2 packets (8 g total) by mouth 3 (three) times daily. For 10 days  . cyclobenzaprine (FLEXERIL) 5 MG tablet Take 1 tablet (5 mg total) by mouth 3 (three) times daily as needed for muscle spasms.  [name] FLUoxetine (PROZAC) 20 MG tablet Take 1 tablet (20 mg total) by mouth daily.  [name] Glatiramer Acetate 40 MG/ML SOSY Inject 40 mg into the skin 3 (three) times a week.  . lisdexamfetamine (VYVANSE) 60 MG capsule Take 1 capsule (60 mg total) by mouth every morning.  . mirabegron ER (MYRBETRIQ) 50 MG TB24 tablet Take 50 mg by mouth daily.  [name] oxybutynin (DITROPAN-XL) 10 MG 24 hr tablet Take 10 mg by mouth daily.  . pantoprazole (PROTONIX) 40 MG tablet Take 40 mg by mouth daily.  . [DISCONTINUED] FLUoxetine (PROZAC) 20 MG tablet Take 1 tablet (20 mg total) by mouth daily.  . [DISCONTINUED] lisdexamfetamine (VYVANSE) 60 MG capsule Take 1 capsule (60 mg total) by mouth every morning.   No facility-administered encounter medications on file as of 11/01/2015.    Past Psychiatric History/Hospitalization(s): Patient has history of depression started when she was in the process of divorce.  She was given Zoloft and then Prozac.  She also given Focalin, Ritalin and Strattera.  Patient denies any history of suicidal attempt or any psychosis. Anxiety: No Bipolar Disorder: No Depression: Yes Mania: No Psychosis: No Schizophrenia: No Personality Disorder: No Hospitalization for psychiatric illness: No History of Electroconvulsive Shock Therapy: No Prior Suicide  Attempts: No  Recent Results (from the past 2160 hour(s))  Comprehensive metabolic panel     Status: Abnormal   Collection Time: 08/15/15  4:16 PM  Result Value Ref Range   Glucose 90 65 - 99 mg/dL   BUN 19 6 - 24 mg/dL   Creatinine, Ser 0.54 (L) 0.57 - 1.00 mg/dL   GFR calc non Af Amer 107 >59 mL/min/1.73   GFR calc Af Amer 124 >59 mL/min/1.73   BUN/Creatinine Ratio 35 (H) 9  - 23    Comment: **Effective August 22, 2015 BUN/Creatinine Ratio**   reference interval will be changing to:              Age                  Female          Female      0 days   -  7 days          9 - 25         9 - 26      8 days   - 30 days          8 - 3        10 - 33      1 month  -  6 months       11 - 43        11 - 52      7 months -  1 year         20 - 110        20 - 38      2 years  -  5 years        60 - 13        19 - 7      6 years  - 12 years        94 - 26        13 - 86     13 years  - 23 years        82 - 37        10 - 67     18 years  - 70 years         9 - 6         9 - 62                >59 years        10 - 24        12 - 28    Sodium 141 134 - 144 mmol/L   Potassium 4.7 3.5 - 5.2 mmol/L   Chloride 101 96 - 106 mmol/L   CO2 26 18 - 29 mmol/L   Calcium 9.2 8.7 - 10.2 mg/dL   Total Protein 6.5 6.0 - 8.5 g/dL   Albumin 4.1 3.5 - 5.5 g/dL   Globulin, Total 2.4 1.5 - 4.5 g/dL   Albumin/Globulin Ratio 1.7 1.2 - 2.2    Comment:               **Please note reference interval change**   Bilirubin Total <0.2 0.0 - 1.2 mg/dL   Alkaline Phosphatase 95 39 - 117 IU/L   AST 16 0 - 40 IU/L   ALT 24 0 - 32 IU/L  POCT urinalysis dipstick     Status: Abnormal   Collection Time: 08/30/15  3:39 PM  Result Value Ref Range  Color, UA YELLOW    Clarity, UA CLEAR    Glucose, UA NEG    Bilirubin, UA NEG    Ketones, UA TRACE    Spec Grav, UA 1.020    Blood, UA NEG    pH, UA 7.5    Protein, UA TRACE    Urobilinogen, UA 1.0    Nitrite, UA NEG    Leukocytes, UA Negative Negative   Physical Exam: Constitutional:  BP 112/78 mmHg  Pulse 86  Ht 5' 7.5" (1.715 m)  Wt 180 lb 12.8 oz (82.01 kg)  BMI 27.88 kg/m2  Musculoskeletal: Strength & Muscle Tone: within normal limits Gait & Station: normal Patient leans: N/A and Patient has a normal posture  Psychiatric Specialty Exam: Physical Exam  ROS  Blood pressure 112/78, pulse 86, height 5' 7.5" (1.715 m), weight 180 lb  12.8 oz (82.01 kg).Body mass index is 27.88 kg/(m^2).  General Appearance: Casual  Eye Contact::  Good  Speech:  Normal Rate  Volume:  Normal  Mood:  Euthymic  Affect:  Appropriate  Thought Process:  Coherent  Orientation:  Full (Time, Place, and Person)  Thought Content:  WDL  Suicidal Thoughts:  No  Homicidal Thoughts:  No  Memory:  Immediate;   Fair Recent;   Good Remote;   Good  Judgement:  Good  Insight:  Good  Psychomotor Activity:  Normal  Concentration:  Good  Recall:  Good  Fund of Knowledge:  Good  Language:  Good  Akathisia:  No  Handed:  Right  AIMS (if indicated):     Assets:  Communication Skills Desire for Improvement Financial Resources/Insurance Housing Physical Health Social Support  ADL's:  Intact  Cognition:  WNL  Sleep:       Established Problem, Stable/Improving (1), Review of Psycho-Social Stressors (1), Review of Last Therapy Session (1) and Review of Medication Regimen & Side Effects (2)  Assessment: Axis I: ADD, depressive disorder NOS  Axis II: Deferred  Axis III: See medical history  Plan:  Patient is stable on Prozac 20 mg and Vyvanse 60 mg daily.  She has not taken Xanax in past  6 months.   Patient denies any side effects including any tremors, shakes or any EPS. Continue Prozac 20 mg and Vyvanse 60 mg daily.  Recommended to call us back if she has any question, concern or if she feels worsening of the symptom.  Follow-up in 3 months.   Ethylene Reznick T., MD 11/01/2015

## 2015-11-02 DIAGNOSIS — Z08 Encounter for follow-up examination after completed treatment for malignant neoplasm: Secondary | ICD-10-CM | POA: Diagnosis not present

## 2015-11-02 DIAGNOSIS — Z85828 Personal history of other malignant neoplasm of skin: Secondary | ICD-10-CM | POA: Diagnosis not present

## 2015-11-25 MED FILL — COPAXONE 40 MG/ML SOSY: 40 | 28 days supply | Qty: 12 | Fill #1

## 2015-11-29 ENCOUNTER — Ambulatory Visit (INDEPENDENT_AMBULATORY_CARE_PROVIDER_SITE_OTHER): Payer: 59 | Admitting: Neurology

## 2015-11-29 ENCOUNTER — Encounter: Payer: Self-pay | Admitting: Neurology

## 2015-11-29 VITALS — BP 126/72 | HR 78 | Resp 16 | Ht 67.5 in | Wt 181.5 lb

## 2015-11-29 DIAGNOSIS — F909 Attention-deficit hyperactivity disorder, unspecified type: Secondary | ICD-10-CM

## 2015-11-29 DIAGNOSIS — Z79899 Other long term (current) drug therapy: Secondary | ICD-10-CM

## 2015-11-29 DIAGNOSIS — R200 Anesthesia of skin: Secondary | ICD-10-CM | POA: Diagnosis not present

## 2015-11-29 DIAGNOSIS — G35 Multiple sclerosis: Secondary | ICD-10-CM | POA: Diagnosis not present

## 2015-11-29 DIAGNOSIS — F988 Other specified behavioral and emotional disorders with onset usually occurring in childhood and adolescence: Secondary | ICD-10-CM

## 2015-11-29 DIAGNOSIS — M5412 Radiculopathy, cervical region: Secondary | ICD-10-CM

## 2015-11-29 DIAGNOSIS — R260 Ataxic gait: Secondary | ICD-10-CM

## 2015-11-29 NOTE — Progress Notes (Signed)
[facility]  PATIENT: [name] DOB: [birth year]  REFERRING DOCTOR OR PCP:  Andrena Mews SOURCE: patient and EMR records  _________________________________   HISTORICAL  CHIEF COMPLAINT:  Chief Complaint  Patient presents with  . Multiple Sclerosis    Sts. she is tolerating Copaxone well. She has had a recurrence of skin cancer right leg and will be having more surgery/fim  . Neck Pain    Sts. she woke up several weeks ago with neck pain.  Has constant numbness down right arm into right thum, index and middle fingers/fim    HISTORY OF PRESENT ILLNESS:  [name] is a 55 yo woman with MS.   She is currently on Gilenya and tolerates it well.    She denies any exacerbation or new symptoms.  However, she has had neck and left arm numbness x 2 weeks.    This year,  she developed two basal cell skin cancers and we switched form Gilenya to Aubagio.    However due to continued safety concerns we switched to Copaxone.    She still needs a surgical procedure for the skin cancer..   She feels her MS has been stable.   Gait/strength/sensation:    She feels her gait is little bit off balance and she stumbles some but does not fall. She denies any significant difficulty with weakness. She has had some numbness in her hands and more recently in the left arm.  Bladder: Due to urinary frequency, and urgency she is being treated with Ditropan plus Myrbetriq's. That combination has worked very well for her.  Vision: She had optic neuritis in the past but denies any recent difficulties with her vision.  Fatigue/sleep:    She does have some fatigue associated with MS that is helped by the Vyvanse. Her current dose of 60 mg she does better on that than she did on other medications.    It also helps her attention deficit. She is sleeping well most nights. She very rarely will take a Xanax at night.  Mood/cognition: She denies any current difficulties with depression or  anxiety. She has not had any problems with cognition.      MS History:   She was diagnosed around 2000 after presenting with numbness in the hands and feet.  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 She was admitted to the hospital. She then was diagnosed with MS and is to see Dr. Jacqulynn Cadet at [facility]. He started her on Betaseron.   She did fairly well on Betaseron. She did have several small exacerbations and received some steroids a couple times. Often the exacerbations would be associated with headache as well. In 2012, she opted to switch to Gilenya to have oral therapy for the MS. She has done very well on that medication without any exacerbations.  Her June 2015 MRI did showed only one focus that was not present on her MRI from March 2010.   Due to basal cell cancers, she stopped Gilenya, shortly was on Aubagi and then switched to Copaxone in 2017.     REVIEW OF SYSTEMS: Constitutional: No fevers, chills, sweats, or change in appetite.   Notes some fatigue Eyes: No visual changes, double vision, eye pain Ear, nose and throat: No hearing loss, ear pain, nasal congestion, sore throat Cardiovascular: No chest pain, palpitations Respiratory: No shortness of breath at rest or with exertion.   No wheezes GastrointestinaI: No nausea, vomiting,  diarrhea, abdominal pain, fecal incontinence Genitourinary: Improve urinary frequency on medication. Musculoskeletal: No neck pain, back pain.   Notes right hand pain/numbness Integumentary: No rash, pruritus, skin lesions Neurological: as above Psychiatric: Depression and anxiety are doing well on medication. Endocrine: No palpitations, diaphoresis, change in appetite, change in weigh or increased thirst Hematologic/Lymphatic: No anemia, purpura, petechiae. Allergic/Immunologic: No itchy/runny eyes, nasal congestion, recent allergic reactions, rashes  ALLERGIES: Allergies  Allergen Reactions  . Cephalexin  Hives and Itching    HOME MEDICATIONS:  Current outpatient prescriptions:  .  ALPRAZolam (XANAX) 0.5 MG tablet, Take 1 tablet (0.5 mg total) by mouth at bedtime as needed. Behavioral health, Disp: 30 tablet, Rfl: 0 .  cholestyramine (QUESTRAN) 4 g packet, Take 2 packets (8 g total) by mouth 3 (three) times daily. For 10 days, Disp: 60 each, Rfl: 0 .  cyclobenzaprine (FLEXERIL) 5 MG tablet, Take 1 tablet (5 mg total) by mouth 3 (three) times daily as needed for muscle spasms., Disp: 30 tablet, Rfl: 1 .  FLUoxetine (PROZAC) 20 MG tablet, Take 1 tablet (20 mg total) by mouth daily., Disp: 90 tablet, Rfl: 0 .  Glatiramer Acetate 40 MG/ML SOSY, Inject 40 mg into the skin 3 (three) times a week., Disp: 36 Syringe, Rfl: 3 .  lisdexamfetamine (VYVANSE) 60 MG capsule, Take 1 capsule (60 mg total) by mouth every morning., Disp: 90 capsule, Rfl: 0 .  mirabegron ER (MYRBETRIQ) 50 MG TB24 tablet, Take 50 mg by mouth daily., Disp: , Rfl:  .  oxybutynin (DITROPAN-XL) 10 MG 24 hr tablet, Take 10 mg by mouth daily., Disp: , Rfl:  .  pantoprazole (PROTONIX) 40 MG tablet, Take 40 mg by mouth daily., Disp: , Rfl:   PAST MEDICAL HISTORY: Past Medical History  Diagnosis Date  . Arthritis   . GERD (gastroesophageal reflux disease)   . Mitral valve prolapse   . Multiple sclerosis ([place])   . Multiple sclerosis ([place])   . Cervical dysplasia   . ADD (attention deficit disorder with hyperactivity)     PAST SURGICAL HISTORY: Past Surgical History  Procedure Laterality Date  . Tubal ligation    . Back surgery    . Abdominal hysterectomy  2004    ovaries retained    FAMILY HISTORY: Family History  Problem Relation Age of Onset  . Depression Mother   . Stroke Mother   . Heart disease Mother   . Dementia Mother   . Cancer Neg Hx   . Diabetes Neg Hx     SOCIAL HISTORY:  Social History   Social History  . Marital Status: Divorced    Spouse Name: N/A  . Number of Children: N/A  . Years of  Education: N/A   Occupational History  . Not on file.   Social History Main Topics  . Smoking status: Current Some Day Smoker -- 0.25 packs/day    Types: Cigarettes  . Smokeless tobacco: Never Used  . Alcohol Use: No  . Drug Use: No  . Sexual Activity: Yes   Other Topics Concern  . Not on file   Social History Narrative     PHYSICAL EXAM  Filed Vitals:   11/29/15 1624  BP: 126/72  Pulse: 78  Resp: 16  Height: 5' 7.5" (1.715 m)  Weight: 181 lb 8 oz (82.328 kg)    Body mass index is 27.99 kg/(m^2).   General: The patient is well-developed and well-nourished and in no acute distress   Skin: Extremities are without significant edema.  Musculoskeletal:   Neck is nontender  Neurologic Exam  Mental status: The patient is alert and oriented x 3 at the time of the examination. The patient has apparent normal recent and remote memory, with an apparently normal attention span and concentration ability.   Speech is normal.  Cranial nerves: Extraocular movements are full.  There is good facial sensation to soft touch bilaterally.Facial strength is normal.  Trapezius and sternocleidomastoid strength is normal. No dysarthria is noted.   No obvious hearing deficits are noted.  Motor:  Muscle bulk is normal.   Tone is normal. Strength is  5 / 5 in all 4 extremities except 4+/5 APB right and 4++/5 left triceps  Sensory: Sensory testing is intact to pinprick, soft touch and vibration sensation except slight allodynia over digits 2 and 3 on the left.  Coordination: Cerebellar testing reveals good finger-nose-finger and heel-to-shin bilaterally.  Gait and station: Station is normal.   Gait is near normal. Tandem gait is mildly wide.      DIAGNOSTIC DATA (LABS, IMAGING, TESTING) - I reviewed patient records, labs, notes, testing and imaging myself where available.     ASSESSMENT AND PLAN  MS (multiple sclerosis) ([place])  Ataxic gait  High risk medication use  ADD  (attention deficit disorder)  Cervical radiculitis  Numbness    1.   Continue Copaxone 2.   Continue meds for urinary frequency and Vyvanse (gets form [facility]) 3.   They active and exercises as tolerated.  4.    She appears to have a probable mild left C7 radiculopathy. If she starts to experience pain when he more weakness we will check an MRI.  She will return to see me in 5-6 months or sooner if she has new or worsening neurologic symptoms.  Richard A. Felecia Shelling, MD, PhD Certified in Neurology, [place] Neurophysiology, Sleep Medicine, Pain Medicine and Neuroimaging  [facility] [address], [place], [place] [address] [phone]

## 2015-12-06 ENCOUNTER — Telehealth: Payer: Self-pay | Admitting: *Deleted

## 2015-12-06 MED ORDER — OXYBUTYNIN CHLORIDE ER 10 MG PO TB24: 10.0000 mg | ORAL_TABLET | Freq: Every day | ORAL | Status: DC

## 2015-12-06 MED ORDER — MIRABEGRON ER 50 MG PO TB24: 50.0000 mg | ORAL_TABLET | Freq: Every day | ORAL | Status: DC

## 2015-12-06 MED FILL — MYRBETRIQ ER 50 MG TABLET: 50 | 30 days supply | Qty: 30 | Fill #0

## 2015-12-06 MED FILL — OXYBUTYNIN CL ER 10 MG TAB: 10 | 30 days supply | Qty: 30 | Fill #0

## 2015-12-06 NOTE — Telephone Encounter (Signed)
Myrbetriq rx. escribed to [facility]. Pharmacy as requested/fim

## 2015-12-06 NOTE — Addendum Note (Signed)
Addended by: France Ravens I on: 12/06/2015 04:28 PM   Modules accepted: Orders

## 2015-12-12 DIAGNOSIS — C44722 Squamous cell carcinoma of skin of right lower limb, including hip: Secondary | ICD-10-CM | POA: Diagnosis not present

## 2015-12-16 ENCOUNTER — Encounter: Payer: Self-pay | Admitting: Pharmacist

## 2015-12-16 NOTE — Progress Notes (Deleted)
   S: Patient presents today to the [facility].  Patient is currently taking Copaxone for Multiple Sclerosis. Patient is managed by Dr. Felecia Shelling for this.   Adherence: denies any missed doses  Dosing:  Multiple sclerosis (MS) (relapsing): SubQ: Note: Glatiramer 20 mg/mL and 40 mg/mL formulations are not interchangeable. MS (relapsing-remitting): 20 mg once daily or 40 mg 3 times per week administered at least 48 hours apart   Monitoring: Hypersensitivity: Flu-like symptoms: Weakness: Injection site reactions: S/sx of infection:   O:     Lab Results  Component Value Date   WBC 5.9 01/05/2015   HCT 37.8 01/05/2015   MCV 87 01/05/2015   PLT 286 01/05/2015      Chemistry      Component Value Date/Time   NA 141 08/15/2015 1616   K 4.7 08/15/2015 1616   CL 101 08/15/2015 1616   CO2 26 08/15/2015 1616   BUN 19 08/15/2015 1616   CREATININE 0.54 (L) 08/15/2015 1616      Component Value Date/Time   CALCIUM 9.2 08/15/2015 1616   ALKPHOS 95 08/15/2015 1616   AST 16 08/15/2015 1616   ALT 24 08/15/2015 1616   BILITOT <0.2 08/15/2015 1616       A/P: 1. Medication review: patient on Copaxone for multiple sclerosis and is tolerating the medication well with no adverse effects and improved control of MS. Reviewed the medication with her, including the following: Copaxone (glatiramer) is a mixture of random polymers of four amino acids: L-alanine, L-glutamic acid, L-lysine, and L-tyrosine. The resulting mixture is antigenically similar to myelin basic protein, which is important component of the myelin sheath of nerves. Glatiramer is through to induce and activate T-lymphocyte suppressor cells specific for a myelin antigen. Common adverse effects are injection site reactions, infections, flu-like s/sx, and muscle weakness. No recommendations for changes.   Nicoletta Ba, PharmD, BCPS, BCACP, [facility] [phone]

## 2015-12-19 DIAGNOSIS — C44722 Squamous cell carcinoma of skin of right lower limb, including hip: Secondary | ICD-10-CM | POA: Diagnosis not present

## 2015-12-26 DIAGNOSIS — S81801D Unspecified open wound, right lower leg, subsequent encounter: Secondary | ICD-10-CM | POA: Diagnosis not present

## 2015-12-27 MED FILL — PANTOPRAZOLE SOD DR 40 MG T: 40 | 90 days supply | Qty: 90 | Fill #2

## 2016-01-02 DIAGNOSIS — S81801D Unspecified open wound, right lower leg, subsequent encounter: Secondary | ICD-10-CM | POA: Diagnosis not present

## 2016-01-09 DIAGNOSIS — Z4801 Encounter for change or removal of surgical wound dressing: Secondary | ICD-10-CM | POA: Diagnosis not present

## 2016-01-09 DIAGNOSIS — Z48817 Encounter for surgical aftercare following surgery on the skin and subcutaneous tissue: Secondary | ICD-10-CM | POA: Diagnosis not present

## 2016-01-09 DIAGNOSIS — S81801D Unspecified open wound, right lower leg, subsequent encounter: Secondary | ICD-10-CM | POA: Diagnosis not present

## 2016-01-16 DIAGNOSIS — S81801D Unspecified open wound, right lower leg, subsequent encounter: Secondary | ICD-10-CM | POA: Diagnosis not present

## 2016-01-24 DIAGNOSIS — S81801A Unspecified open wound, right lower leg, initial encounter: Secondary | ICD-10-CM | POA: Diagnosis not present

## 2016-01-30 DIAGNOSIS — Z4801 Encounter for change or removal of surgical wound dressing: Secondary | ICD-10-CM | POA: Diagnosis not present

## 2016-02-01 ENCOUNTER — Ambulatory Visit (HOSPITAL_COMMUNITY): Payer: Self-pay | Admitting: Psychiatry

## 2016-02-02 ENCOUNTER — Other Ambulatory Visit (HOSPITAL_COMMUNITY): Payer: Self-pay

## 2016-02-02 DIAGNOSIS — F988 Other specified behavioral and emotional disorders with onset usually occurring in childhood and adolescence: Secondary | ICD-10-CM

## 2016-02-02 MED ORDER — LISDEXAMFETAMINE DIMESYLATE 60 MG PO CAPS: 60.0000 mg | ORAL_CAPSULE | ORAL | 0 refills | Status: DC

## 2016-02-02 NOTE — Progress Notes (Signed)
Patient called for refill on Vyvanse, she has a follow up in November. This was approved by Dr. Lovena Le and patient was called to come and pick up.

## 2016-02-06 DIAGNOSIS — Z4801 Encounter for change or removal of surgical wound dressing: Secondary | ICD-10-CM | POA: Diagnosis not present

## 2016-02-06 DIAGNOSIS — S81801D Unspecified open wound, right lower leg, subsequent encounter: Secondary | ICD-10-CM | POA: Diagnosis not present

## 2016-02-07 ENCOUNTER — Other Ambulatory Visit (HOSPITAL_COMMUNITY): Payer: Self-pay | Admitting: Psychiatry

## 2016-02-07 DIAGNOSIS — F32A Depression, unspecified: Secondary | ICD-10-CM

## 2016-02-07 DIAGNOSIS — F329 Major depressive disorder, single episode, unspecified: Secondary | ICD-10-CM

## 2016-02-07 MED FILL — COPAXONE 40 MG/ML SOSY: 40 | 28 days supply | Qty: 12 | Fill #2

## 2016-02-08 ENCOUNTER — Telehealth (HOSPITAL_COMMUNITY): Payer: Self-pay

## 2016-02-08 MED FILL — VYVANSE 60 MG CAPSULE: 60 | 90 days supply | Qty: 90 | Fill #0

## 2016-02-08 NOTE — Telephone Encounter (Signed)
02/08/16 4:01pm Patient came and pick-up rx script.Danielle KitchenMariana Kaufman

## 2016-02-09 ENCOUNTER — Telehealth: Payer: Self-pay | Admitting: *Deleted

## 2016-02-09 DIAGNOSIS — F329 Major depressive disorder, single episode, unspecified: Secondary | ICD-10-CM

## 2016-02-09 DIAGNOSIS — F32A Depression, unspecified: Secondary | ICD-10-CM

## 2016-02-09 MED ORDER — FLUOXETINE HCL 20 MG PO TABS: 20.0000 mg | ORAL_TABLET | Freq: Every day | ORAL | 3 refills | Status: DC

## 2016-02-09 MED FILL — FLUoxetine HCL 20 MG CAPS: 20 | 90 days supply | Qty: 90 | Fill #0

## 2016-02-09 NOTE — Telephone Encounter (Signed)
Fluoxetine escribed to [facility] per faxed request/fim

## 2016-02-14 DIAGNOSIS — L929 Granulomatous disorder of the skin and subcutaneous tissue, unspecified: Secondary | ICD-10-CM | POA: Diagnosis not present

## 2016-03-13 ENCOUNTER — Encounter: Payer: Self-pay | Admitting: Student

## 2016-03-13 ENCOUNTER — Ambulatory Visit (INDEPENDENT_AMBULATORY_CARE_PROVIDER_SITE_OTHER): Payer: 59 | Admitting: Student

## 2016-03-13 ENCOUNTER — Ambulatory Visit (HOSPITAL_COMMUNITY)
Admission: RE | Admit: 2016-03-13 | Discharge: 2016-03-13 | Disposition: A | Discharge status: Home / Self Care | Payer: 59 | Source: Ambulatory Visit | Attending: Family Medicine | Admitting: Family Medicine

## 2016-03-13 ENCOUNTER — Telehealth: Payer: Self-pay | Admitting: Neurology

## 2016-03-13 VITALS — BP 140/77 | HR 92 | Temp 98.3°F | Wt 182.0 lb

## 2016-03-13 DIAGNOSIS — R05 Cough: Secondary | ICD-10-CM | POA: Insufficient documentation

## 2016-03-13 DIAGNOSIS — J069 Acute upper respiratory infection, unspecified: Secondary | ICD-10-CM | POA: Diagnosis not present

## 2016-03-13 DIAGNOSIS — R059 Cough, unspecified: Secondary | ICD-10-CM

## 2016-03-13 MED ORDER — GLATIRAMER ACETATE 40 MG/ML ~~LOC~~ SOSY: 40.0000 mg | PREFILLED_SYRINGE | SUBCUTANEOUS | 3 refills | Status: DC

## 2016-03-13 MED ORDER — AZITHROMYCIN 250 MG PO TABS: ORAL_TABLET | ORAL | 0 refills | Status: DC

## 2016-03-13 MED FILL — COPAXONE 40 MG/ML SOSY: 40 | 28 days supply | Qty: 12 | Fill #0

## 2016-03-13 MED FILL — AZITHROMYCIN 250 MG TABLET: 250 | 5 days supply | Qty: 6 | Fill #0

## 2016-03-13 NOTE — Progress Notes (Signed)
   Subjective:    Patient ID: [name], female    DOB: 11-09-1960, 55 y.o.   MRN: [id number]   CC: cough, congestion for 11 days  HPI: 55 y/o F with PMH of MS on Glatiramer, reports cough and congestion    Cough and congestion - started 11 days ago - reports low grade fevers throughout this time Tmax, 100.3 - has had intermitted ear fullness through out this time - no facial pain, denies headache - denies sore throat - denies shortness of breath, chest pain  Smoking status reviewed  Review of Systems  Per HPI, else denies abdominal pain, N/V/D    Objective:  BP 140/77   Pulse 92   Temp 98.3 F (36.8 C) (Oral)   Wt 182 lb (82.6 kg)   BMI 28.08 kg/m  Vitals and nursing note reviewed  General: NAD HEENT: normal tympanic membranes bilaterally, no erythema or bulging, non tender maxillary sinuses, frontal sinus Cardiac: RRR Respiratory: CTAB, normal effort Extremities: no edema or cyanosis. Skin: warm and dry, no rashes noted Neuro: alert and oriented   Assessment & Plan:    Acute upper respiratory infection Continued cough with low grade fever insetting of being chronically immune suppressed.  - will obtain CXR to rule out pulmonary process - will start course of azithromycin - follow up in 2 weeks or earlier as needed     Adel Neyer A. Lincoln Brigham MD, [facility] Resident PGY-3 Pager [phone]

## 2016-03-13 NOTE — Assessment & Plan Note (Signed)
Continued cough with low grade fever insetting of being chronically immune suppressed.  - will obtain CXR to rule out pulmonary process - will start course of azithromycin - follow up in 2 weeks or earlier as needed

## 2016-03-13 NOTE — Telephone Encounter (Signed)
I have spoken with [name] this morning.  She requests antibiotics for cold sx.  I have advised RAS is not in the office this week.  She will f/u with pcp/fim

## 2016-03-13 NOTE — Patient Instructions (Signed)
Follow with PCP in 2 weeks or sooner if symptoms do not improve Take Antibiotics as directed Please take Alka seltzer cold and flu to help with symptoms Please obtain chest xray today If you have any questions or concerns, call the office at [phone]

## 2016-03-13 NOTE — Telephone Encounter (Signed)
Pt called said she had a general question about MS. Please call

## 2016-03-15 ENCOUNTER — Telehealth: Payer: Self-pay | Admitting: Student

## 2016-03-15 NOTE — Telephone Encounter (Signed)
Called patient to inform her of her CXR results. She did not answer so a voicemail was left

## 2016-03-15 NOTE — Telephone Encounter (Signed)
Chest xray was normal with no evidence of cardiopulmonary disease. She was called but as she did not answer, a voiemail was left describing this. Giver he prolonged illness ( 11 days) and current immune compromised state ( on Glatiramer acetate) she was instructed to continue the Azithromycin as prescribed and follow up as previously directed.

## 2016-03-28 ENCOUNTER — Ambulatory Visit: Payer: Self-pay | Admitting: Student

## 2016-03-29 ENCOUNTER — Ambulatory Visit (INDEPENDENT_AMBULATORY_CARE_PROVIDER_SITE_OTHER): Payer: 59 | Admitting: Psychiatry

## 2016-03-29 ENCOUNTER — Encounter (HOSPITAL_COMMUNITY): Payer: Self-pay | Admitting: Psychiatry

## 2016-03-29 DIAGNOSIS — F329 Major depressive disorder, single episode, unspecified: Secondary | ICD-10-CM | POA: Diagnosis not present

## 2016-03-29 DIAGNOSIS — Z79899 Other long term (current) drug therapy: Secondary | ICD-10-CM | POA: Diagnosis not present

## 2016-03-29 DIAGNOSIS — F900 Attention-deficit hyperactivity disorder, predominantly inattentive type: Secondary | ICD-10-CM

## 2016-03-29 MED ORDER — LISDEXAMFETAMINE DIMESYLATE 60 MG PO CAPS: 60.0000 mg | ORAL_CAPSULE | ORAL | 0 refills | Status: DC

## 2016-03-29 NOTE — Progress Notes (Signed)
[place] [phone] Progress Note  [name] MR:[id number] 55 y.o.  03/29/2016 3:47 PM  Chief Complaint:  Medication management and followup.    History of Present Illness: [name] came for her followup appointment.  She is compliant with Prozac and Vyvanse.  Recently her Prozac was refilled by her primary care physician.  She has some time episodes of sadness but denies any feeling of hopelessness or worthlessness.  She is happy that her MS is a stable.  She denies any irritability, anger, mania or psychosis.  Her attention and focus is good.  She is able to do multitasking.  She has chronic stress from the job and sometime gets overwhelmed but overall she is handling her job very well.  She denies any major panic attack and has not taken Xanax in past 6 months.  Her energy level is good.  She has no tremors, shakes or any EPS.  Patient denies drinking alcohol or using any illegal substances.  Her appetite is okay.  Her vital signs are stable.  Suicidal Ideation: No Plan Formed: No Patient has means to carry out plan: No  Homicidal Ideation: No Plan Formed: No Patient has means to carry out plan: No  Review of Systems: Psychiatric: Agitation: No Hallucination: No Depressed Mood: No Insomnia: No Hypersomnia: No Altered Concentration: No Feels Worthless: No Grandiose Ideas: No Belief In Special Powers: No New/Increased Substance Abuse: No Compulsions: No  Neurologic: Headache: No Seizure: No Paresthesias: No  Medical History:  She has multiple sclerosis, mitral valve prolapse status post cardiac ablation, GERD, arthritis and history of migraine headaches.  Her neurologist is Dr. Felecia [name].  Outpatient Encounter Prescriptions as of 03/29/2016  Medication Sig  . ALPRAZolam (XANAX) 0.5 MG tablet Take 1 tablet (0.5 mg total) by mouth at bedtime as needed. Behavioral health  . FLUoxetine (PROZAC) 20 MG tablet Take 1 tablet (20 mg total) by mouth daily.  [name] Glatiramer  Acetate 40 MG/ML SOSY Inject 40 mg into the skin 3 (three) times a week.  . lisdexamfetamine (VYVANSE) 60 MG capsule Take 1 capsule (60 mg total) by mouth every morning.  . mirabegron ER (MYRBETRIQ) 50 MG TB24 tablet Take 1 tablet (50 mg total) by mouth daily.  [name] oxybutynin (DITROPAN-XL) 10 MG 24 hr tablet Take 1 tablet (10 mg total) by mouth daily.  . pantoprazole (PROTONIX) 40 MG tablet Take 40 mg by mouth daily.  . [DISCONTINUED] lisdexamfetamine (VYVANSE) 60 MG capsule Take 1 capsule (60 mg total) by mouth every morning.  [name] azithromycin (ZITHROMAX) 250 MG tablet Take 2 tablets on day one, then one tablet daily for 4 days (Patient not taking: Reported on 03/29/2016)  . cholestyramine (QUESTRAN) 4 g packet Take 2 packets (8 g total) by mouth 3 (three) times daily. For 10 days (Patient not taking: Reported on 03/29/2016)  . cyclobenzaprine (FLEXERIL) 5 MG tablet Take 1 tablet (5 mg total) by mouth 3 (three) times daily as needed for muscle spasms. (Patient not taking: Reported on 03/29/2016)   No facility-administered encounter medications on file as of 03/29/2016.     Past Psychiatric History/Hospitalization(s): Patient has history of depression started when she was in the process of divorce.  She was given Zoloft and then Prozac.  She also given Focalin, Ritalin and Strattera.  Patient denies any history of suicidal attempt or any psychosis. Anxiety: No Bipolar Disorder: No Depression: Yes Mania: No Psychosis: No Schizophrenia: No Personality Disorder: No Hospitalization for psychiatric illness: No History of Electroconvulsive Shock Therapy:  No Prior Suicide Attempts: No  No results found for this or any previous visit (from the past 2160 hour(s)). Physical Exam: Constitutional:  BP 122/70   Pulse 89   Ht 5' 7.5" (1.715 m)   Wt 180 lb 9.6 oz (81.9 kg)   BMI 27.87 kg/m   Musculoskeletal: Strength & Muscle Tone: within normal limits Gait & Station: normal Patient leans: N/A and  Patient has a normal posture  Psychiatric Specialty Exam: Physical Exam  Review of Systems  Constitutional: Negative.   HENT: Negative.   Eyes: Negative.   Respiratory: Negative.   Cardiovascular: Negative.   Gastrointestinal: Negative.   Genitourinary: Negative.   Musculoskeletal: Negative.   Skin: Negative.   Endo/Heme/Allergies: Negative.     Blood pressure 122/70, pulse 89, height 5' 7.5" (1.715 m), weight 180 lb 9.6 oz (81.9 kg).Body mass index is 27.87 kg/m.  General Appearance: Casual  Eye Contact::  Good  Speech:  Normal Rate  Volume:  Normal  Mood:  Euthymic  Affect:  Appropriate  Thought Process:  Coherent  Orientation:  Full (Time, Place, and Person)  Thought Content:  WDL  Suicidal Thoughts:  No  Homicidal Thoughts:  No  Memory:  Immediate;   Fair Recent;   Good Remote;   Good  Judgement:  Good  Insight:  Good  Psychomotor Activity:  Normal  Concentration:  Good  Recall:  Good  Fund of Knowledge:  Good  Language:  Good  Akathisia:  No  Handed:  Right  AIMS (if indicated):     Assets:  Communication Skills Desire for Improvement Financial Resources/Insurance Housing Physical Health Social Support  ADL's:  Intact  Cognition:  WNL  Sleep:       Established Problem, Stable/Improving (1), Review of Psycho-Social Stressors (1), Review of Last Therapy Session (1) and Review of Medication Regimen & Side Effects (2)  Assessment: Axis I: ADD, depressive disorder NOS  Axis II: Deferred  Axis III: See medical history  Plan:  Patient depression and ADD symptoms are stable on her current medication.  She has no side effects.  She was recently given Prozac from her primary care physician for multiple refills.  She will require a new prescription of Vyvanse 60 mg daily.  Discussed medication side effects and benefits.  She did not have taken Xanax in past 10 months. Patient denies any side effects including any tremors, shakes or any EPS. Continue Prozac 20  mg and Vyvanse 60 mg daily.  Recommended to call us back if she has any question, concern or if she feels worsening of the symptom.  Follow-up in 3 months.   Danielle T., MD 03/29/2016         Patient ID: [name], female   DOB: Feb 21, 1961, 55 y.o.   MRN: [id number]

## 2016-04-03 MED FILL — PANTOPRAZOLE SOD DR 40 MG T: 40 | 90 days supply | Qty: 90 | Fill #3

## 2016-05-15 MED FILL — COPAXONE 40 MG/ML SOSY: 40 | 28 days supply | Qty: 12 | Fill #1

## 2016-05-15 MED FILL — FLUoxetine HCL 20 MG CAPS: 20 | 90 days supply | Qty: 90 | Fill #1

## 2016-05-17 MED FILL — VYVANSE 60 MG CAPSULE: 60 | 90 days supply | Qty: 90 | Fill #0

## 2016-05-31 ENCOUNTER — Encounter: Payer: Self-pay | Admitting: Neurology

## 2016-05-31 ENCOUNTER — Ambulatory Visit (INDEPENDENT_AMBULATORY_CARE_PROVIDER_SITE_OTHER): Payer: 59 | Admitting: Neurology

## 2016-05-31 DIAGNOSIS — F329 Major depressive disorder, single episode, unspecified: Secondary | ICD-10-CM | POA: Diagnosis not present

## 2016-05-31 DIAGNOSIS — F32A Depression, unspecified: Secondary | ICD-10-CM

## 2016-05-31 MED ORDER — FLUOXETINE HCL 20 MG PO TABS: 20.0000 mg | ORAL_TABLET | Freq: Every day | ORAL | 3 refills | Status: DC

## 2016-05-31 NOTE — Progress Notes (Signed)
[facility]  PATIENT: [name] DOB: [birth year]  REFERRING DOCTOR OR PCP:  Andrena Mews SOURCE: patient and EMR records  _________________________________   HISTORICAL  CHIEF COMPLAINT:  Chief Complaint  Patient presents with  . Multiple Sclerosis    Sts. she is tolerating Copaxone with some mild flu like sx.  Would like to discuss increasing Prozac to 30mg  daily, due to added stressors.  No SI/HI/fim    HISTORY OF PRESENT ILLNESS:  [name] is a 56 yo woman with MS.     MS:   She is currently on Copaxone and tolerates it well.    She denies any exacerbation or new symptoms.  However, she has had neck and left arm numbness x 2 weeks.    This year,  she developed two basal cell skin cancers and we switched form Gilenya to Aubagio.    However due to continued safety concerns we switched to Copaxone.    She still needs a surgical procedure for the skin cancer..   She feels her MS has been stable.   Gait/strength/sensation:    She feels her gait is little bit off balance and she stumbles some but does not fall. She denies any significant difficulty with weakness. She has had some numbness in her hands and more recently in the left arm.  Bladder: Due to urinary frequency, and urgency she is being treated with Ditropan plus Myrbetriq. The combination has worked very well for her.  Vision: She had optic neuritis in the past but denies any recent difficulties with her vision.  Fatigue/sleep:    She does have some fatigue associated with MS that is helped by the Vyvanse. Her current dose of 60 mg she does better on that than she did on other medications.    It also helps her attention deficit. She is sleeping well most nights. She very rarely will take a Xanax at night.  Mood/cognition: She has depression or anxiety. She felt better on a higher dose of Prozac.   She has not had any problems with cognition.      MS History:   She was diagnosed around 2000  after presenting with numbness in the hands and feet.  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 She was admitted to the hospital. She then was diagnosed with MS and is to see Dr. Jacqulynn Cadet at [facility]. He started her on Betaseron.   She did fairly well on Betaseron. She did have several small exacerbations and received some steroids a couple times. Often the exacerbations would be associated with headache as well. In 2012, she opted to switch to Gilenya to have oral therapy for the MS. She has done very well on that medication without any exacerbations.  Her June 2015 MRI did showed only one focus that was not present on her MRI from March 2010.   Due to basal cell cancers, she stopped Gilenya, shortly was on Aubagi and then switched to Copaxone in 2017.     REVIEW OF SYSTEMS: Constitutional: No fevers, chills, sweats, or change in appetite.   Notes some fatigue Eyes: No visual changes, double vision, eye pain Ear, nose and throat: No hearing loss, ear pain, nasal congestion, sore throat Cardiovascular: No chest pain, palpitations Respiratory: No shortness of breath at rest or with exertion.   No wheezes GastrointestinaI: No nausea, vomiting, diarrhea, abdominal pain, fecal incontinence Genitourinary: Improve urinary frequency on medication. Musculoskeletal: No  neck pain, back pain.   Notes right hand pain/numbness Integumentary: No rash, pruritus, skin lesions Neurological: as above Psychiatric: Depression and anxiety are doing well on medication. Endocrine: No palpitations, diaphoresis, change in appetite, change in weigh or increased thirst Hematologic/Lymphatic: No anemia, purpura, petechiae. Allergic/Immunologic: No itchy/runny eyes, nasal congestion, recent allergic reactions, rashes  ALLERGIES: Allergies  Allergen Reactions  . Cephalexin Hives and Itching    HOME MEDICATIONS:  Current Outpatient Prescriptions:  .  ALPRAZolam (XANAX)  0.5 MG tablet, Take 1 tablet (0.5 mg total) by mouth at bedtime as needed. Behavioral health, Disp: 30 tablet, Rfl: 0 .  cyclobenzaprine (FLEXERIL) 5 MG tablet, Take 1 tablet (5 mg total) by mouth 3 (three) times daily as needed for muscle spasms., Disp: 30 tablet, Rfl: 1 .  FLUoxetine (PROZAC) 20 MG tablet, Take 1 tablet (20 mg total) by mouth daily., Disp: 90 tablet, Rfl: 3 .  Glatiramer Acetate 40 MG/ML SOSY, Inject 40 mg into the skin 3 (three) times a week., Disp: 36 Syringe, Rfl: 3 .  lisdexamfetamine (VYVANSE) 60 MG capsule, Take 1 capsule (60 mg total) by mouth every morning., Disp: 90 capsule, Rfl: 0 .  mirabegron ER (MYRBETRIQ) 50 MG TB24 tablet, Take 1 tablet (50 mg total) by mouth daily., Disp: 30 tablet, Rfl: 11 .  oxybutynin (DITROPAN-XL) 10 MG 24 hr tablet, Take 1 tablet (10 mg total) by mouth daily., Disp: 30 tablet, Rfl: 11 .  pantoprazole (PROTONIX) 40 MG tablet, Take 40 mg by mouth daily., Disp: , Rfl:   PAST MEDICAL HISTORY: Past Medical History:  Diagnosis Date  . ADD (attention deficit disorder with hyperactivity)   . Arthritis   . Cervical dysplasia   . GERD (gastroesophageal reflux disease)   . Mitral valve prolapse   . Multiple sclerosis ([place])   . Multiple sclerosis ([place])     PAST SURGICAL HISTORY: Past Surgical History:  Procedure Laterality Date  . ABDOMINAL HYSTERECTOMY  2004   ovaries retained  . BACK SURGERY    . TUBAL LIGATION      FAMILY HISTORY: Family History  Problem Relation Age of Onset  . Depression Mother   . Stroke Mother   . Heart disease Mother   . Dementia Mother   . Cancer Neg Hx   . Diabetes Neg Hx     SOCIAL HISTORY:  Social History   Social History  . Marital status: Divorced    Spouse name: N/A  . Number of children: N/A  . Years of education: N/A   Occupational History  . Not on file.   Social History Main Topics  . Smoking status: Current Some Day Smoker    Packs/day: 0.25    Types: Cigarettes  . Smokeless  tobacco: Never Used  . Alcohol use No  . Drug use: No  . Sexual activity: Yes   Other Topics Concern  . Not on file   Social History Narrative  . No narrative on file     PHYSICAL EXAM  Vitals:   05/31/16 1528  BP: 131/86  Pulse: 97  Resp: 18  Weight: 180 lb 8 oz (81.9 kg)  Height: 5' 7.5" (1.715 m)    Body mass index is 27.85 kg/m.   General: The patient is well-developed and well-nourished and in no acute distress   Skin: She has scars from skin cancer surgery. .    Musculoskeletal:   Neck is nontender  Neurologic Exam  Mental status: The patient is alert and oriented x 3  at the time of the examination. The patient has apparent normal recent and remote memory, with an apparently normal attention span and concentration ability.   Speech is normal.  Cranial nerves: Extraocular movements are full.  There is good facial sensation to soft touch bilaterally.Facial strength is normal.  Trapezius and sternocleidomastoid strength is normal. No dysarthria is noted.   No obvious hearing deficits are noted.  Motor:  Muscle bulk is normal.   Tone is normal. Strength is  5 / 5 in all 4 extremities except 4+/5 APB right and 4++/5 left triceps  Sensory: Sensory testing is intact to pinprick, soft touch and vibration sensation   Coordination: Cerebellar testing reveals good finger-nose-finger and heel-to-shin bilaterally.  Gait and station: Station is normal.   Gait is near normal. Tandem gait is mildly wide.      DIAGNOSTIC DATA (LABS, IMAGING, TESTING) - I reviewed patient records, labs, notes, testing and imaging myself where available.     ASSESSMENT AND PLAN  No diagnosis found.   1.   Continue Copaxone 40- mg tiw 2.   Continue meds for urinary frequency, mood and ADD/fatigue (gets Vyvanse form [place]) 3.   Increase Prozac.   4.   She will return to see me in 5-6 months or sooner if she has new or worsening neurologic symptoms.  Richard A. Felecia Shelling, MD,  PhD Certified in Neurology, [place] [facility], Sleep Medicine, Pain Medicine and Neuroimaging  [facility] [address], [place], [place] [address] [phone]

## 2016-07-04 DIAGNOSIS — K219 Gastro-esophageal reflux disease without esophagitis: Secondary | ICD-10-CM | POA: Diagnosis not present

## 2016-07-04 MED FILL — PANTOPRAZOLE SOD DR 40 MG T: 40 | 90 days supply | Qty: 90 | Fill #0

## 2016-07-09 ENCOUNTER — Other Ambulatory Visit (HOSPITAL_COMMUNITY): Payer: Self-pay | Admitting: Psychiatry

## 2016-07-09 ENCOUNTER — Telehealth (HOSPITAL_COMMUNITY): Payer: Self-pay

## 2016-07-09 MED ORDER — ALPRAZOLAM 0.5 MG PO TABS: ORAL_TABLET | ORAL | 0 refills | Status: DC

## 2016-07-09 MED FILL — ALPRAZolam 0.5 MG TABS: 0.5 | 30 days supply | Qty: 30 | Fill #0

## 2016-07-09 NOTE — Telephone Encounter (Signed)
We can provide Xanax 0.5 mg #20 tablets to take for severe anxiety and panic attacks.

## 2016-07-09 NOTE — Telephone Encounter (Signed)
Called in the Alprazolam and called patient to let her know

## 2016-07-09 NOTE — Telephone Encounter (Signed)
Patient is calling for a refill on her Xanax, she got the last prescription in 2015, patient states she does not take often, which is why it has been so long.Please review and advise, thank you

## 2016-07-10 ENCOUNTER — Telehealth: Payer: Self-pay | Admitting: Neurology

## 2016-07-10 MED ORDER — FLUOXETINE HCL 40 MG PO CAPS: 40.0000 mg | ORAL_CAPSULE | Freq: Every day | ORAL | 3 refills | Status: DC

## 2016-07-10 MED FILL — FLUoxetine HCL 40 MG CAPS: 40 | 90 days supply | Qty: 90 | Fill #0

## 2016-07-10 NOTE — Telephone Encounter (Signed)
I have spoken with [name].  RAS did intend to increase Prozac--new rx. escribed to [facility].[name]

## 2016-07-10 NOTE — Telephone Encounter (Signed)
Patient was in a few weeks ago and Dr. Felecia Shelling was to change FLUoxetine (PROZAC) 20 MG tablet to 40 mg. Please call and discuss.

## 2016-07-10 NOTE — Addendum Note (Signed)
Addended by: France Ravens I on: 07/10/2016 02:26 PM   Modules accepted: Orders

## 2016-07-23 ENCOUNTER — Telehealth: Payer: Self-pay | Admitting: Neurology

## 2016-07-23 NOTE — Telephone Encounter (Signed)
I have spoken with [name] again this morning, per RAS, advised that since vision is ok, this is not likely to be optic neuritis, but have offered appt. for h/a.  She verbalized understanding of same, declined appt. for h/a at this time, but will call back if it persists or she has changes in vision/fim

## 2016-07-23 NOTE — Telephone Encounter (Signed)
I have spoken with [name] this morning.  She c/o h/a onset 1 wk ago, right eye pain--sensitive to light--onset 3 days ago.  Vision is ok. No relief with Naproxen, Aleve, Hydrocodone.  Will check with RAS and call her back/fim

## 2016-07-23 NOTE — Telephone Encounter (Signed)
Pt called said she is having sided HA with pain in the rt eye for the past week. Says she thinks it is MS headache.

## 2016-07-31 ENCOUNTER — Encounter (HOSPITAL_COMMUNITY): Payer: Self-pay | Admitting: Psychiatry

## 2016-07-31 ENCOUNTER — Ambulatory Visit (INDEPENDENT_AMBULATORY_CARE_PROVIDER_SITE_OTHER): Payer: 59 | Admitting: Psychiatry

## 2016-07-31 DIAGNOSIS — F900 Attention-deficit hyperactivity disorder, predominantly inattentive type: Secondary | ICD-10-CM | POA: Diagnosis not present

## 2016-07-31 DIAGNOSIS — F332 Major depressive disorder, recurrent severe without psychotic features: Secondary | ICD-10-CM | POA: Diagnosis not present

## 2016-07-31 DIAGNOSIS — Z818 Family history of other mental and behavioral disorders: Secondary | ICD-10-CM | POA: Diagnosis not present

## 2016-07-31 DIAGNOSIS — F17210 Nicotine dependence, cigarettes, uncomplicated: Secondary | ICD-10-CM | POA: Diagnosis not present

## 2016-07-31 DIAGNOSIS — Z79899 Other long term (current) drug therapy: Secondary | ICD-10-CM

## 2016-07-31 MED ORDER — FLUOXETINE HCL 40 MG PO CAPS: 40.0000 mg | ORAL_CAPSULE | Freq: Every day | ORAL | 3 refills | Status: DC

## 2016-07-31 MED ORDER — LISDEXAMFETAMINE DIMESYLATE 60 MG PO CAPS: 60.0000 mg | ORAL_CAPSULE | ORAL | 0 refills | Status: DC

## 2016-07-31 NOTE — Progress Notes (Signed)
[facility] MD/PA/NP OP Progress Note  07/31/2016 3:57 PM [name]  MRN:  [id number]  Chief Complaint:  Subjective:  I'm doing better on medication.  HPI: [name] came for her follow-up appointment.  She is taking her medication as prescribed.  Her job is tiring but going very well.  She denies any irritability, anger, mania or any psychosis.  Her energy level is good.  She is taking Prozac and Vyvanse and rarely takes Xanax.  She sleeping good.  She is able to do multitasking and her attention concentration is good.  Patient denies drinking alcohol or using any illegal substances.  She has no tremors, shakes or any EPS.  Patient works in radiology.  Her appetite is okay.  Her vital signs are stable.  Visit Diagnosis:    ICD-9-CM ICD-10-CM   1. Attention deficit hyperactivity disorder (ADHD), predominantly inattentive type 314.00 F90.0 lisdexamfetamine (VYVANSE) 60 MG capsule    Past Psychiatric History: Reviewed. Patient has history of depression started when she was in the process of divorce.  She was given Zoloft and then Prozac.  She also given Focalin, Ritalin and Strattera.  Patient denies any history of suicidal attempt or any psychosis.  Past Medical History:  Past Medical History:  Diagnosis Date  . ADD (attention deficit disorder with hyperactivity)   . Arthritis   . Cervical dysplasia   . GERD (gastroesophageal reflux disease)   . Mitral valve prolapse   . Multiple sclerosis ([place])   . Multiple sclerosis ([place])     Past Surgical History:  Procedure Laterality Date  . ABDOMINAL HYSTERECTOMY  2004   ovaries retained  . BACK SURGERY    . TUBAL LIGATION      Family Psychiatric History: Reviewed.  Family History:  Family History  Problem Relation Age of Onset  . Depression Mother   . Stroke Mother   . Heart disease Mother   . Dementia Mother   . Cancer Neg Hx   . Diabetes Neg Hx     Social History:  Social History   Social History  . Marital status: Divorced   Spouse name: N/A  . Number of children: N/A  . Years of education: N/A   Social History Main Topics  . Smoking status: Current Some Day Smoker    Packs/day: 0.25    Types: Cigarettes  . Smokeless tobacco: Never Used  . Alcohol use No  . Drug use: No  . Sexual activity: Yes   Other Topics Concern  . None   Social History Narrative  . None    Allergies:  Allergies  Allergen Reactions  . Cephalexin Hives and Itching    Metabolic Disorder Labs: No results found for: HGBA1C, MPG No results found for: PROLACTIN No results found for: CHOL, TRIG, HDL, CHOLHDL, VLDL, LDLCALC   Current Medications: Current Outpatient Prescriptions  Medication Sig Dispense Refill  . ALPRAZolam (XANAX) 0.5 MG tablet 1 tablet po prn severe anxiety - not to exceed 1 a day 20 tablet 0  . cyclobenzaprine (FLEXERIL) 5 MG tablet Take 1 tablet (5 mg total) by mouth 3 (three) times daily as needed for muscle spasms. 30 tablet 1  . FLUoxetine (PROZAC) 40 MG capsule Take 1 capsule (40 mg total) by mouth daily. 90 capsule 3  . Glatiramer Acetate 40 MG/ML SOSY Inject 40 mg into the skin 3 (three) times a week. 36 Syringe 3  . lisdexamfetamine (VYVANSE) 60 MG capsule Take 1 capsule (60 mg total) by mouth every morning. [place]  capsule 0  . mirabegron ER (MYRBETRIQ) 50 MG TB24 tablet Take 1 tablet (50 mg total) by mouth daily. 30 tablet 11  . oxybutynin (DITROPAN-XL) 10 MG 24 hr tablet Take 1 tablet (10 mg total) by mouth daily. 30 tablet 11  . pantoprazole (PROTONIX) 40 MG tablet Take 40 mg by mouth daily.     No current facility-administered medications for this visit.     Neurologic: Headache: No Seizure: No Paresthesias: No  Musculoskeletal: Strength & Muscle Tone: within normal limits Gait & Station: normal Patient leans: N/A  Psychiatric Specialty Exam: ROS  Blood pressure 124/78, pulse 86, height 5\' 7"  (1.702 m), weight 181 lb 3.2 oz (82.2 kg).Body mass index is 28.38 kg/m.  General Appearance:  Casual  Eye Contact:  Good  Speech:  Clear and Coherent  Volume:  Normal  Mood:  Euthymic  Affect:  Congruent  Thought Process:  Goal Directed  Orientation:  Full (Time, Place, and Person)  Thought Content: WDL and Logical   Suicidal Thoughts:  No  Homicidal Thoughts:  No  Memory:  Immediate;   Good Recent;   Good Remote;   Good  Judgement:  Good  Insight:  Good  Psychomotor Activity:  Normal  Concentration:  Concentration: Good and Attention Span: Good  Recall:  Good  Fund of Knowledge: Good  Language: Good  Akathisia:  No  Handed:  Right  AIMS (if indicated):  0  Assets:  Communication Skills Desire for Improvement Housing Physical Health Resilience Social Support  ADL's:  Intact  Cognition: WNL  Sleep:  good   Assessment: Attention deficit disorder inattentive type.  Depressive disorder NOS  Plan: Patient is doing better on her current psychiatric medication.  I will continue Vyvanse 60 mg daily and Prozac 40 mg daily.  She has no side effects including any tremors shakes or any insomnia.  Continue Xanax 0.5 mg as needed for severe anxiety.  Discussed medication side effects and benefits.  Recommended to call us back if she has any question or any concern.  Follow-up in 3 months.   Kenecia Barren T., MD 07/31/2016, 3:57 PM

## 2016-08-17 MED FILL — VYVANSE 60 MG CAPSULE: 60 | 90 days supply | Qty: 90 | Fill #0

## 2016-09-10 ENCOUNTER — Ambulatory Visit (INDEPENDENT_AMBULATORY_CARE_PROVIDER_SITE_OTHER): Payer: 59 | Admitting: Orthopaedic Surgery

## 2016-09-10 ENCOUNTER — Encounter (INDEPENDENT_AMBULATORY_CARE_PROVIDER_SITE_OTHER): Payer: Self-pay | Admitting: Orthopaedic Surgery

## 2016-09-10 ENCOUNTER — Ambulatory Visit (INDEPENDENT_AMBULATORY_CARE_PROVIDER_SITE_OTHER): Payer: Self-pay

## 2016-09-10 VITALS — BP 124/71

## 2016-09-10 DIAGNOSIS — M79641 Pain in right hand: Secondary | ICD-10-CM

## 2016-09-10 NOTE — Progress Notes (Signed)
Office Visit Note   Patient: [name]           Date of Birth: [birth year]           MRN: [id number] Visit Date: 09/10/2016              Requested by: Kinnie Feil, MD [address] [place], [place] [address] PCP: Andrena Mews, MD   Assessment & Plan: Visit Diagnoses:  1. Pain in right hand   2. Pain of right hand     Plan: Advanced osteoarthritis base of right thumb, early degenerative no synovitis flexor tendon right long finger. Have discussed different treatment options including NSAIDs, splinting, injections and surgery. She preferred to try the Voltaren gel. If that's unsuccessful she will consider cortisone injection. She is fully aware that the definitive treatment for her thumb would be surgery  Follow-Up Instructions: Return if symptoms worsen or fail to improve.   Orders:  Orders Placed This Encounter  Procedures  . XR Hand Complete Right   No orders of the defined types were placed in this encounter.     Procedures: No procedures performed   Clinical Data: No additional findings.   Subjective: Chief Complaint  Patient presents with  . Right Hand - Bone Pain    [name] is a 56 y o that presents with Right hand pain x 3 weeks. No injury, bony protusion under the 3rd finger of Right hand. Hurts when she presses on it.   [name] is well-known to me  having been evaluated for the osteoarthritis of the base of her right thumb in the past. That is been a problem for her on an episodic basis. Presently it's "not that bad". Her biggest problem is with her right long finger. She's had some pain on the palmar aspect near the metacarpal phalangeal joint. With further questioning she actually has had some episodes of true triggering. Denies any numbness or tingling. HPI  Review of Systems   Objective: Vital Signs: BP 124/71   Physical Exam  Ortho Exam examination the right hand reveals significant arthritic change at the base of the thumb at  the carpometacarpal joint. There are palpable osteophytes and partial subluxation of the joint. It is tender to touch but not read. Very minimal swelling. Positive grind test Local tenderness on the palmar aspect of the long finger at the level of the metacarpal phalangeal joint. There is thickening of the skin . No erythema or ecchymosis. No numbness or tingling. No active triggering   Imaging: Xr Hand Complete Right  Result Date: 09/10/2016 Films of the right hand were obtained in 3 projections. There is advanced osteoarthritis at the base of the thumb at the carpometacarpal joint. There is partial subluxation and osteophyte formation. No abnormality of about the long finger which is symptomatic with early triggering    PMFS History: Patient Active Problem List   Diagnosis Date Noted  . Acute upper respiratory infection 03/13/2016  . Cervical radiculitis 11/29/2015  . Numbness 11/29/2015  . Squamous cell carcinoma 05/25/2015  . Neck pain on right side 05/03/2015  . 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 Ataxic gait 11/10/2014  . High risk medication use 11/10/2014  . Urinary frequency 11/10/2014  . Right carpal tunnel syndrome 11/10/2014  . Influenza-like symptoms 02/03/2014  . Allergic rhinitis 09/14/2013  . ADD (attention deficit disorder) 03/10/2012  . Tobacco use 09/10/2011  . Visit for preventive health examination 09/10/2011  . MS (multiple sclerosis) ([place]) 09/04/2011  Past Medical History:  Diagnosis Date  . ADD (attention deficit disorder with hyperactivity)   . Arthritis   . Cervical dysplasia   . GERD (gastroesophageal reflux disease)   . Mitral valve prolapse   . Multiple sclerosis ([place])   . Multiple sclerosis ([place])     Family History  Problem Relation Age of Onset  . Depression Mother   . Stroke Mother   . Heart disease Mother   . Dementia Mother   . Cancer Neg Hx   . Diabetes Neg Hx     Past Surgical History:  Procedure Laterality Date  . ABDOMINAL  HYSTERECTOMY  2004   ovaries retained  . BACK SURGERY    . TUBAL LIGATION     Social History   Occupational History  . Not on file.   Social History Main Topics  . Smoking status: Current Some Day Smoker    Packs/day: 0.25    Types: Cigarettes  . Smokeless tobacco: Never Used  . Alcohol use No  . Drug use: No  . Sexual activity: Yes     Garald Balding, MD   Note - This record has been created using [employer].  Chart creation errors have been sought, but may not always  have been located. Such creation errors do not reflect on  the standard of medical care.

## 2016-10-01 MED FILL — PANTOPRAZOLE SOD DR 40 MG T: 40 | 90 days supply | Qty: 90 | Fill #1

## 2016-10-04 ENCOUNTER — Ambulatory Visit (INDEPENDENT_AMBULATORY_CARE_PROVIDER_SITE_OTHER): Payer: 59 | Admitting: Orthopaedic Surgery

## 2016-10-04 DIAGNOSIS — G8929 Other chronic pain: Secondary | ICD-10-CM | POA: Diagnosis not present

## 2016-10-04 DIAGNOSIS — M79641 Pain in right hand: Secondary | ICD-10-CM | POA: Diagnosis not present

## 2016-10-04 DIAGNOSIS — M79644 Pain in right finger(s): Secondary | ICD-10-CM

## 2016-10-04 MED ORDER — LIDOCAINE HCL 2 % IJ SOLN
1.0000 mL | INTRAMUSCULAR | Status: AC | PRN
Start: 2016-10-04 — End: 2016-10-04
  Administered 2016-10-04: 1 mL

## 2016-10-04 MED ORDER — METHYLPREDNISOLONE ACETATE 40 MG/ML IJ SUSP
20.0000 mg | INTRAMUSCULAR | Status: AC | PRN
  Administered 2016-10-04: 20 mg

## 2016-10-04 NOTE — Progress Notes (Signed)
Office Visit Note   Patient: [name]           Date of Birth: [birth year]           MRN: [id number] Visit Date: 10/04/2016              Requested by: Danielle Feil, MD [address] [place], [place] [address] PCP: Danielle Feil, MD   Assessment & Plan: Visit Diagnoses:  1. Chronic pain of right thumb   2. Pain of right hand   Osteoarthritis base of right thumb. De Quervain's right wrist  Plan: Cortisone injection base of right thumb and into first dorsal extensor compartment. This didn't alleviate her pain. I'll apply a volar wrist splint that includes thumb. I will also consult Dr. Fredna [name] for evaluation of de Quervain's decompression and surgery for the arthritis at the base of the thumb  Follow-Up Instructions: Return if symptoms worsen or fail to improve.   Orders:  Orders Placed This Encounter  Procedures  . Ambulatory referral to Hand Surgery   No orders of the defined types were placed in this encounter.     Procedures: Hand/UE Inj Date/Time: 10/04/2016 11:26 AM Performed by: [name] Authorized by: [name]   Consent Given by:  Patient Timeout: prior to procedure the correct patient, procedure, and site was verified   Indications:  Pain Condition: de Quervain's   Site:  R extensor compartment 1 Needle Size:  27 G Approach:  Dorsal Medications:  1 mL lidocaine 2 %; 20 mg methylPREDNISolone acetate 40 MG/ML Patient tolerance:  Patient tolerated the procedure well with no immediate complications      Clinical Data: No additional findings.   Subjective: No chief complaint on file.  [name] was seen recently for evaluation of right nondominant hand pain. Problem was a combination of arthritis at the base of the thumb and probable de Quervain's. She's been using Voltaren gel but still having for moderate pain and wanted to come in today for an injection HPI  Review of Systems   Objective: Vital Signs: There were  no vitals taken for this visit.  Physical Exam  Ortho Exam right nondominant hand exam revealed pain at the base of the right thumb with a positive grind test. There is partial subluxation at the carpometacarpal joint consistent with her osteoarthritis. Neurovascular exam intact. She also was a positive Finkelstein's test and pain along the first dorsal extensor compartment with mild edema  Specialty Comments:  No specialty comments available.  Imaging: No results found.   PMFS History: Patient Active Problem List   Diagnosis Date Noted  . Acute upper respiratory infection 03/13/2016  . Cervical radiculitis 11/29/2015  . Numbness 11/29/2015  . Squamous cell carcinoma 05/25/2015  . Neck pain on right side 05/03/2015  . 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 Ataxic gait 11/10/2014  . High risk medication use 11/10/2014  . Urinary frequency 11/10/2014  . Right carpal tunnel syndrome 11/10/2014  . Influenza-like symptoms 02/03/2014  . Allergic rhinitis 09/14/2013  . ADD (attention deficit disorder) 03/10/2012  . Tobacco use 09/10/2011  . Visit for preventive health examination 09/10/2011  . MS (multiple sclerosis) ([place]) 09/04/2011   Past Medical History:  Diagnosis Date  . ADD (attention deficit disorder with hyperactivity)   . Arthritis   . Cervical dysplasia   . GERD (gastroesophageal reflux disease)   . Mitral valve prolapse   . Multiple sclerosis ([place])   . Multiple sclerosis ([place])  Family History  Problem Relation Age of Onset  . Depression Mother   . Stroke Mother   . Heart disease Mother   . Dementia Mother   . Cancer Neg Hx   . Diabetes Neg Hx     Past Surgical History:  Procedure Laterality Date  . ABDOMINAL HYSTERECTOMY  2004   ovaries retained  . BACK SURGERY    . TUBAL LIGATION     Social History   Occupational History  . Not on file.   Social History Main Topics  . Smoking status: Current Some Day Smoker    Packs/day: 0.25    Types: Cigarettes  .  Smokeless tobacco: Never Used  . Alcohol use No  . Drug use: No  . Sexual activity: Yes     Danielle Balding, MD   Note - This record has been created using [employer].  Chart creation errors have been sought, but may not always  have been located. Such creation errors do not reflect on  the standard of medical care.

## 2016-10-08 DIAGNOSIS — C44722 Squamous cell carcinoma of skin of right lower limb, including hip: Secondary | ICD-10-CM | POA: Diagnosis not present

## 2016-10-08 DIAGNOSIS — D485 Neoplasm of uncertain behavior of skin: Secondary | ICD-10-CM | POA: Diagnosis not present

## 2016-10-10 DIAGNOSIS — M65331 Trigger finger, right middle finger: Secondary | ICD-10-CM | POA: Diagnosis not present

## 2016-10-10 DIAGNOSIS — M1811 Unilateral primary osteoarthritis of first carpometacarpal joint, right hand: Secondary | ICD-10-CM | POA: Diagnosis not present

## 2016-10-10 DIAGNOSIS — M79644 Pain in right finger(s): Secondary | ICD-10-CM | POA: Diagnosis not present

## 2016-10-10 DIAGNOSIS — M654 Radial styloid tenosynovitis [de Quervain]: Secondary | ICD-10-CM | POA: Diagnosis not present

## 2016-10-10 MED FILL — FLUoxetine HCL 40 MG CAPS: 40 | 90 days supply | Qty: 90 | Fill #1

## 2016-11-01 ENCOUNTER — Ambulatory Visit (INDEPENDENT_AMBULATORY_CARE_PROVIDER_SITE_OTHER): Payer: 59 | Admitting: Psychiatry

## 2016-11-01 ENCOUNTER — Encounter (HOSPITAL_COMMUNITY): Payer: Self-pay | Admitting: Psychiatry

## 2016-11-01 DIAGNOSIS — F329 Major depressive disorder, single episode, unspecified: Secondary | ICD-10-CM

## 2016-11-01 DIAGNOSIS — F900 Attention-deficit hyperactivity disorder, predominantly inattentive type: Secondary | ICD-10-CM

## 2016-11-01 DIAGNOSIS — Z818 Family history of other mental and behavioral disorders: Secondary | ICD-10-CM

## 2016-11-01 DIAGNOSIS — Z810 Family history of intellectual disabilities: Secondary | ICD-10-CM

## 2016-11-01 DIAGNOSIS — F17210 Nicotine dependence, cigarettes, uncomplicated: Secondary | ICD-10-CM | POA: Diagnosis not present

## 2016-11-01 MED ORDER — LISDEXAMFETAMINE DIMESYLATE 60 MG PO CAPS: 60.0000 mg | ORAL_CAPSULE | ORAL | 0 refills | Status: DC

## 2016-11-01 NOTE — Progress Notes (Signed)
[facility] MD/PA/NP OP Progress Note  11/01/2016 4:20 PM [name]  MRN:  [id number]  Chief Complaint:  Subjective:  I am doing fine.  HPI: Patient came for her follow-up appointment.  She is taking her medication and reported no side effects.  Despite busy at work she is able to handle her job.  She has been compliant with medication.  She is able to do multitasking and her attention concentration is good.  She sleeping good.  She has no other concern.  She denies any crying spells or any feeling of hopelessness or worthlessness.  She works at radiology department.  Patient denies drinking alcohol or using any illegal substances.  Her appetite is okay.  Her vital signs are stable.  Visit Diagnosis:    ICD-10-CM   1. Attention deficit hyperactivity disorder (ADHD), predominantly inattentive type F90.0 lisdexamfetamine (VYVANSE) 60 MG capsule    Past Psychiatric History: Reviewed. Patient has history of depression started when she was in the process of divorce. She was given Zoloft and then Prozac. She also given Focalin, Ritalin and Strattera. Patient denies any history of suicidal attempt or any psychosis.  Past Medical History:  Past Medical History:  Diagnosis Date  . ADD (attention deficit disorder with hyperactivity)   . Arthritis   . Cervical dysplasia   . GERD (gastroesophageal reflux disease)   . Mitral valve prolapse   . Multiple sclerosis ([place])   . Multiple sclerosis ([place])     Past Surgical History:  Procedure Laterality Date  . ABDOMINAL HYSTERECTOMY  2004   ovaries retained  . BACK SURGERY    . TUBAL LIGATION      Family Psychiatric History: Reviewed.  Family History:  Family History  Problem Relation Age of Onset  . Depression Mother   . Stroke Mother   . Heart disease Mother   . Dementia Mother   . Cancer Neg Hx   . Diabetes Neg Hx     Social History:  Social History   Social History  . Marital status: Divorced    Spouse name: N/A  . Number of  children: N/A  . Years of education: N/A   Social History Main Topics  . Smoking status: Current Some Day Smoker    Packs/day: 0.25    Types: Cigarettes  . Smokeless tobacco: Never Used  . Alcohol use No  . Drug use: No  . Sexual activity: Yes   Other Topics Concern  . Not on file   Social History Narrative  . No narrative on file    Allergies:  Allergies  Allergen Reactions  . Cephalexin Hives and Itching    Metabolic Disorder Labs: No results found for: HGBA1C, MPG No results found for: PROLACTIN No results found for: CHOL, TRIG, HDL, CHOLHDL, VLDL, LDLCALC   Current Medications: Current Outpatient Prescriptions  Medication Sig Dispense Refill  . ALPRAZolam (XANAX) 0.5 MG tablet 1 tablet po prn severe anxiety - not to exceed 1 a day 20 tablet 0  . cyclobenzaprine (FLEXERIL) 5 MG tablet Take 1 tablet (5 mg total) by mouth 3 (three) times daily as needed for muscle spasms. 30 tablet 1  . FLUoxetine (PROZAC) 40 MG capsule Take 1 capsule (40 mg total) by mouth daily. 90 capsule 3  . Glatiramer Acetate 40 MG/ML SOSY Inject 40 mg into the skin 3 (three) times a week. (Patient not taking: Reported on 09/10/2016) 36 Syringe 3  . lisdexamfetamine (VYVANSE) 60 MG capsule Take 1 capsule (60 mg total) by  mouth every morning. 90 capsule 0  . mirabegron ER (MYRBETRIQ) 50 MG TB24 tablet Take 1 tablet (50 mg total) by mouth daily. 30 tablet 11  . oxybutynin (DITROPAN-XL) 10 MG 24 hr tablet Take 1 tablet (10 mg total) by mouth daily. 30 tablet 11  . pantoprazole (PROTONIX) 40 MG tablet Take 40 mg by mouth daily.     No current facility-administered medications for this visit.     Neurologic: Headache: No Seizure: No Paresthesias: No  Musculoskeletal: Strength & Muscle Tone: within normal limits Gait & Station: normal Patient leans: N/A  Psychiatric Specialty Exam: ROS  There were no vitals taken for this visit.There is no height or weight on file to calculate BMI.  General  Appearance: Casual  Eye Contact:  Good  Speech:  Clear and Coherent  Volume:  Normal  Mood:  Euthymic  Affect:  Congruent  Thought Process:  Goal Directed  Orientation:  Full (Time, Place, and Person)  Thought Content: WDL and Logical   Suicidal Thoughts:  No  Homicidal Thoughts:  No  Memory:  Immediate;   Good Recent;   Good Remote;   Good  Judgement:  Good  Insight:  Good  Psychomotor Activity:  Normal  Concentration:  Concentration: Good and Attention Span: Good  Recall:  Good  Fund of Knowledge: Good  Language: Good  Akathisia:  No  Handed:  Right  AIMS (if indicated):  0  Assets:  Communication Skills [employer] [place] Talents/Skills  ADL's:  Intact  Cognition: WNL  Sleep:  good    Assessment: Attention deficit disorder, inattentive type.  Depressive disorder NOS.  Plan: Patient is doing better on her current psychiatric medication.  I will continue Vyvanse 60 mg daily and Prozac 40 mg daily.  She still has refill remaining on her Prozac.  She takes Xanax only as needed and does not need a new prescription at this time.  Discussed medication side effects and benefits.  Recommended to call us back if she has any question or any concern.  Follow-up in 3 months.  Everhett Bozard T., MD 11/01/2016, 4:20 PM

## 2016-11-08 ENCOUNTER — Encounter: Payer: Self-pay | Admitting: Neurology

## 2016-11-08 ENCOUNTER — Ambulatory Visit (INDEPENDENT_AMBULATORY_CARE_PROVIDER_SITE_OTHER): Payer: 59 | Admitting: Neurology

## 2016-11-08 ENCOUNTER — Telehealth: Payer: Self-pay | Admitting: Neurology

## 2016-11-08 VITALS — BP 121/75 | HR 80 | Resp 14 | Ht 67.5 in | Wt 181.5 lb

## 2016-11-08 DIAGNOSIS — R260 Ataxic gait: Secondary | ICD-10-CM | POA: Diagnosis not present

## 2016-11-08 DIAGNOSIS — R350 Frequency of micturition: Secondary | ICD-10-CM

## 2016-11-08 DIAGNOSIS — G35 Multiple sclerosis: Secondary | ICD-10-CM

## 2016-11-08 DIAGNOSIS — F988 Other specified behavioral and emotional disorders with onset usually occurring in childhood and adolescence: Secondary | ICD-10-CM

## 2016-11-08 DIAGNOSIS — C4492 Squamous cell carcinoma of skin, unspecified: Secondary | ICD-10-CM | POA: Diagnosis not present

## 2016-11-08 DIAGNOSIS — IMO0002 Reserved for concepts with insufficient information to code with codable children: Secondary | ICD-10-CM

## 2016-11-08 DIAGNOSIS — H469 Unspecified optic neuritis: Secondary | ICD-10-CM | POA: Diagnosis not present

## 2016-11-08 NOTE — Telephone Encounter (Signed)
RAS is concerned pt.'s visual disturbance may represent optic neuritis.  I spoke with [name] and gave appt. 1330 today/fim

## 2016-11-08 NOTE — Telephone Encounter (Signed)
Pt is having blurred vision in rt eye for 3rd day. She can't read out of the eye. Please call

## 2016-11-08 NOTE — Progress Notes (Signed)
[facility]  PATIENT: [name] DOB: 01-31-1961  REFERRING DOCTOR OR PCP:  [name] SOURCE: patient and EMR records  _________________________________   HISTORICAL  CHIEF COMPLAINT:  Chief Complaint  Patient presents with  . Multiple Sclerosis    C/O blurry vision right eye onset 3 days ago.  No double vision. No eye pain.  Missed Copaxone for 2 wks. in May 2018 when she had skin ca. removed, o/w has not missed doses.[name]    HISTORY OF PRESENT ILLNESS:  [name] is a 56 yo woman with MS.   She has had altered visoin x 3 days but feels cognitively off since yesterday and feels slightly off balanced.   peech seems mildly slowed.    MS:   She has had multiple skin cancer removed. She is currently on Copaxone. She tolerates it well. She did miss a couple weeks of the injections in May when she had one of the skin cancers removed.   3 days ago she began to experience blurry vision in the right eye.   This year,  she developed two basal cell skin cancers and we switched form Gilenya to Aubagio.    However due to continued safety concerns we switched to Copaxone.      Vision: On Monday night, she noted vision was off while reading but she felt it was just her different glasses.   This was worse Tuesday and then since yesterday, she notes she can only see well if she holds her hand over the right eye.   She did have optic neuritis in the past. 3 days ago she began to notice blurry vision in the right eye.  Gait/strength/sensation:    She feels her gait is little bit off balance and she stumbles some but does not fall. She denies any significant difficulty with weakness. She has had some numbness in her hands and more recently in the left arm.  Bladder: Due to urinary frequency, and urgency she is being treated with Ditropan plus Myrbetriq. The combination has worked very well for her.  Fatigue/sleep:    She feels more tired this week and feels dulled in  general. She still takes Vyvanse and it usually helps fatigue as well as ADD. She is sleeping well most nights. She very rarely will take a Xanax at night.  Mood/cognition: She has depression or anxiety. She felt better on a higher dose of Prozac.   She has not had any problems with cognition.  Other:   She had another skin cancer removed from site adjacent to Moh's procedure.        MS History:   She was diagnosed around 2000 after presenting with numbness in the hands and feet.  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 She then was diagnosed with MS and saw [name] at [facility]. He started her on Betaseron.   She did fairly well on Betaseron. She did have several small exacerbations and received some steroids a couple times. Often the exacerbations would be associated with headache as well. In 2012, she opted to switch to Gilenya to have oral therapy for the MS. She has done very well on that medication without any exacerbations.  Her June 2015 MRI did showed only one focus that was not present on her MRI from March 2010.   Due to basal cell cancers, she stopped Gilenya, shortly was on Aubagi and then switched to Copaxone  in 2017.     REVIEW OF SYSTEMS: Constitutional: No fevers, chills, sweats, or change in appetite.   Notes some fatigue Eyes: No visual changes, double vision, eye pain Ear, nose and throat: No hearing loss, ear pain, nasal congestion, sore throat Cardiovascular: No chest pain, palpitations Respiratory: No shortness of breath at rest or with exertion.   No wheezes GastrointestinaI: No nausea, vomiting, diarrhea, abdominal pain, fecal incontinence Genitourinary: Improve urinary frequency on medication. Musculoskeletal: No neck pain, back pain.   Notes right hand pain/numbness Integumentary: No rash, pruritus, skin lesions Neurological: as above Psychiatric: Depression and anxiety are doing well on medication. Endocrine: No  palpitations, diaphoresis, change in appetite, change in weigh or increased thirst Hematologic/Lymphatic: No anemia, purpura, petechiae. Allergic/Immunologic: No itchy/runny eyes, nasal congestion, recent allergic reactions, rashes  ALLERGIES: Allergies  Allergen Reactions  . Cephalexin Hives and Itching    HOME MEDICATIONS:  Current Outpatient Prescriptions:  .  cyclobenzaprine (FLEXERIL) 5 MG tablet, Take 1 tablet (5 mg total) by mouth 3 (three) times daily as needed for muscle spasms., Disp: 30 tablet, Rfl: 1 .  FLUoxetine (PROZAC) 40 MG capsule, Take 1 capsule (40 mg total) by mouth daily., Disp: 90 capsule, Rfl: 3 .  Glatiramer Acetate 40 MG/ML SOSY, Inject 40 mg into the skin 3 (three) times a week., Disp: 36 Syringe, Rfl: 3 .  lisdexamfetamine (VYVANSE) 60 MG capsule, Take 1 capsule (60 mg total) by mouth every morning., Disp: 90 capsule, Rfl: 0 .  mirabegron ER (MYRBETRIQ) 50 MG TB24 tablet, Take 1 tablet (50 mg total) by mouth daily., Disp: 30 tablet, Rfl: 11 .  oxybutynin (DITROPAN-XL) 10 MG 24 hr tablet, Take 1 tablet (10 mg total) by mouth daily., Disp: 30 tablet, Rfl: 11 .  pantoprazole (PROTONIX) 40 MG tablet, Take 40 mg by mouth daily., Disp: , Rfl:   PAST MEDICAL HISTORY: Past Medical History:  Diagnosis Date  . ADD (attention deficit disorder with hyperactivity)   . Arthritis   . Cervical dysplasia   . GERD (gastroesophageal reflux disease)   . Mitral valve prolapse   . Multiple sclerosis ([place])   . Multiple sclerosis ([place])   . Tendonitis in rigtht wrist     PAST SURGICAL HISTORY: Past Surgical History:  Procedure Laterality Date  . ABDOMINAL HYSTERECTOMY  2004   ovaries retained  . BACK SURGERY    . TUBAL LIGATION      FAMILY HISTORY: Family History  Problem Relation Age of Onset  . Depression Mother   . Stroke Mother   . Heart disease Mother   . Dementia Mother   . Cancer Neg Hx   . Diabetes Neg Hx     SOCIAL HISTORY:  Social History    Social History  . Marital status: Divorced    Spouse name: N/A  . Number of children: N/A  . Years of education: N/A   Occupational History  . Not on file.   Social History Main Topics  . Smoking status: Current Some Day Smoker    Packs/day: 0.25    Types: Cigarettes  . Smokeless tobacco: Never Used  . Alcohol use No     Comment: Occasioanl glass of wine   . Drug use: No  . Sexual activity: Not Currently   Other Topics Concern  . Not on file   Social History Narrative  . No narrative on file     PHYSICAL EXAM  Vitals:   11/08/16 1333  BP: 121/75  Pulse: 80  Resp:  14  Weight: 181 lb 8 oz (82.3 kg)  Height: 5' 7.5" (1.715 m)    Body mass index is 28.01 kg/m.   General: The patient is well-developed and well-nourished and in no acute distress   Skin: She has scars from skin cancer surgery. .    Musculoskeletal:   Neck is nontender  Neurologic Exam  Mental status: The patient is alert and oriented x 3 at the time of the examination. The patient has apparent normal recent and remote memory, with an apparently normal attention span and concentration ability.   Speech is slowed..  Cranial nerves: Extraocular movements are full.  Reduced visual acuity out of the right eye. Color vision was symmetric. Facial strength and sensation is normal.. No dysarthria is noted.   No obvious hearing deficits are noted.  Motor:  Muscle bulk is normal.   Tone is normal. Strength is  5 / 5 in all 4 extremities except 4+/5 APB right and 4++/5 left triceps  Sensory: Sensory testing is intact to pinprick, soft touch and vibration sensation   Coordination: Cerebellar testing reveals good finger-nose-finger and reduced  heel-to-shin   Gait and station: Station is normal.   Gait is mildly wide tandem gait is moderately wide.    DIAGNOSTIC DATA (LABS, IMAGING, TESTING) - I reviewed patient records, labs, notes, testing and imaging myself where available.     ASSESSMENT AND  PLAN  MS (multiple sclerosis) ([facility])  Optic neuritis  Attention deficit disorder, unspecified hyperactivity presence  Ataxic gait  Squamous cell carcinoma  Urinary frequency   1.   Continue Copaxone 40- mg tiw for now.    However, I am concerned that she is having an exacerbation with optic neuritis as well as other symptoms that we need to check an MRI of the brain to determine how much activity she is having. If there is a significant amount, we will need to consider a different disease modifying therapy. 2.   IV Solu-Medrol 1000 mg today and tomorrow. If not better over the weekend we will do 2-3 more days next week. 3.   Continue other meds for mood, ADD and bladder. 4.   She will return to see me in 5-6 months or sooner if she has new or worsening neurologic symptoms.  Patrena Santalucia A. Felecia Shelling, MD, PhD Certified in Neurology, [place] [facility], Sleep Medicine, Pain Medicine and Neuroimaging  [facility] [address], [place], [place] [address] [phone]

## 2016-11-08 NOTE — Telephone Encounter (Signed)
I have spoken with [name] this morning.  She sts. she started wearing new glasses Monday and feels vision has been off since then.  Has determined that vision right eye is blurry (not double). No eye pain.  But the only way she can see clearly is if she covers her right eye.  Will check with RAS and call her back/fim

## 2016-11-08 NOTE — Telephone Encounter (Signed)
Clarification: Vision right eye is blurry with or without her glasses/fim

## 2016-11-13 ENCOUNTER — Telehealth: Payer: Self-pay | Admitting: *Deleted

## 2016-11-13 ENCOUNTER — Encounter: Payer: Self-pay | Admitting: *Deleted

## 2016-11-13 NOTE — Telephone Encounter (Signed)
Letter faxed to [phone] as requested/fim

## 2016-11-13 NOTE — Telephone Encounter (Signed)
Left a voicemail for patient to call back about scheduling [facility] MRI at [facility].

## 2016-11-13 NOTE — Telephone Encounter (Signed)
[place].  I received a message from pt. that she is not able to work this week.  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  This is understandable. I just need to know when she would like to try and go back--can give note for Monday 7/2?

## 2016-11-13 NOTE — Telephone Encounter (Signed)
Pt asked that RN Kyra Searles be told that she plans to go back on Monday. She is asking for a call back on tomorrow.

## 2016-11-13 NOTE — Telephone Encounter (Signed)
I have spoken with [name].  She requests letter allowing her to return to work on Monday 11/19/16 be faxed to [phone]/[employer]

## 2016-11-26 ENCOUNTER — Telehealth: Payer: Self-pay | Admitting: Neurology

## 2016-11-26 MED FILL — VYVANSE 60 MG CAPSULE: 60 | 90 days supply | Qty: 90 | Fill #0

## 2016-11-26 NOTE — Telephone Encounter (Signed)
Patient called and requested to speak with Faith RN. She would like to know if she needs to keep her apt for the 18th due to some steroids she has been taking, she is concerned she may need to move the apt. She would also like Dr. Felecia Shelling to know that she is still having vision issues in both eyes. Blurred vision and double vision in right eye. She would like to know if she needs to call her eye doctor or just continue her steroids. Lastly she wants to discuss some paperwork she has regarding FMLA, she has been told that the paperwork has been faxed to [place] three times with no response. Please call and advise.

## 2016-11-26 NOTE — Telephone Encounter (Signed)
I have spoken with [name] this afternoon, and per RAS, advised that as she has been treated with steroids, she does not need to f/u with opthalmologist right now.  She verbalized understanding of same.  I have also advised that I have not received any FMLA paperwork for her./fim

## 2016-11-27 ENCOUNTER — Telehealth: Payer: Self-pay | Admitting: *Deleted

## 2016-11-27 NOTE — Telephone Encounter (Signed)
Pt [employer] form on Faith desk. 

## 2016-11-28 ENCOUNTER — Telehealth: Payer: Self-pay | Admitting: *Deleted

## 2016-11-28 NOTE — Telephone Encounter (Signed)
Copaxone PA completed by phone with [employer], phone # [phone]. Pt. ID# [id number].  Tried and failed meds: Betaseron (ineffective), Gilenya (skin ca), Aubagio (skin ca).  24 hr. turnaround time for decision/fim

## 2016-11-28 NOTE — Telephone Encounter (Signed)
LMOM that I have received and completed FMLA paperwork, will fax it in this afternoon or in the morning, as soon as RAS has signed it/fim

## 2016-11-28 NOTE — Telephone Encounter (Signed)
FMLA paperwork faxed to [employer] fax# [phone], with fax confirmation received.  Copy mailed to pt. for her records.  Copy sent to be scanned into [employer]/fim

## 2016-11-30 NOTE — Telephone Encounter (Signed)
Received fax from [place]. Wanting to know if patient has relapsing remitting MS. I faxed back response: yes. Fax: [phone]. Received confirmation. Awaiting response.

## 2016-12-05 ENCOUNTER — Ambulatory Visit: Payer: 59 | Admitting: Neurology

## 2016-12-05 MED FILL — GLATIRAMER ACETATE 40 MG/ML: 40 | 28 days supply | Qty: 12 | Fill #2

## 2016-12-05 NOTE — Telephone Encounter (Signed)
Fax received from [place]. (phone# [phone])  Glatiramer approved for dates 11/30/16 to 11/29/17.  PA# [id number]./fim

## 2016-12-11 ENCOUNTER — Telehealth: Payer: Self-pay | Admitting: Neurology

## 2016-12-11 NOTE — Telephone Encounter (Signed)
I called the patient back again and try to schedule her MRI on our Mobile unit but she informed me she wants GI and will contact Gi at [phone].

## 2016-12-11 NOTE — Telephone Encounter (Signed)
Patient calling to schedule MRI. She can be reached at [phone] or [phone].

## 2016-12-11 NOTE — Telephone Encounter (Signed)
She is scheduled at GI for 12/25/16.

## 2016-12-11 NOTE — Telephone Encounter (Signed)
I called both of those numbers and they informed me that she had gone to lunch.

## 2016-12-17 ENCOUNTER — Other Ambulatory Visit: Payer: Self-pay | Admitting: Neurology

## 2016-12-17 ENCOUNTER — Encounter: Payer: Self-pay | Admitting: *Deleted

## 2016-12-17 ENCOUNTER — Telehealth: Payer: Self-pay | Admitting: *Deleted

## 2016-12-17 ENCOUNTER — Telehealth: Payer: Self-pay | Admitting: Neurology

## 2016-12-17 ENCOUNTER — Ambulatory Visit (INDEPENDENT_AMBULATORY_CARE_PROVIDER_SITE_OTHER): Payer: 59 | Admitting: Neurology

## 2016-12-17 DIAGNOSIS — R260 Ataxic gait: Secondary | ICD-10-CM | POA: Diagnosis not present

## 2016-12-17 DIAGNOSIS — F988 Other specified behavioral and emotional disorders with onset usually occurring in childhood and adolescence: Secondary | ICD-10-CM | POA: Diagnosis not present

## 2016-12-17 DIAGNOSIS — R29898 Other symptoms and signs involving the musculoskeletal system: Secondary | ICD-10-CM | POA: Diagnosis not present

## 2016-12-17 DIAGNOSIS — G35 Multiple sclerosis: Secondary | ICD-10-CM

## 2016-12-17 MED ORDER — PREDNISONE 50 MG PO TABS: ORAL_TABLET | ORAL | 0 refills | Status: DC

## 2016-12-17 MED FILL — predniSONE 50 MG TABS: 50 | 9 days supply | Qty: 52 | Fill #0

## 2016-12-17 NOTE — Telephone Encounter (Signed)
Pt. has relapsed on Glatirmaer.  Needs name brand Copaxone.  New srf for Copaxone, dispense as written, completed and  faxed to [employer] fax# [phone]

## 2016-12-17 NOTE — Telephone Encounter (Signed)
Pt called the office said she has new onset of rt leg weakness that started this morning when she got up. She is experiencing no pain. Please call

## 2016-12-17 NOTE — Telephone Encounter (Signed)
I have spoken with [name] this afternoon.  She c/o difficulty picking right leg up, onset upon waking this am.  Will check with RAS and call her back/fim

## 2016-12-17 NOTE — Telephone Encounter (Signed)
Per RAS, ok for one day IV SM and he will see pt. while she's here.  Pt. agreeable--will come in now.  Orders given to [place] in the infusion suite/fim

## 2016-12-18 ENCOUNTER — Encounter: Payer: Self-pay | Admitting: Neurology

## 2016-12-19 ENCOUNTER — Encounter: Payer: Self-pay | Admitting: Neurology

## 2016-12-19 DIAGNOSIS — R29898 Other symptoms and signs involving the musculoskeletal system: Secondary | ICD-10-CM | POA: Insufficient documentation

## 2016-12-19 NOTE — Progress Notes (Addendum)
[facility]  PATIENT: Ora Bollig Zeiders DOB: [birth year]     HISTORICAL  CHIEF COMPLAINT:  No chief complaint on file.   HISTORY OF PRESENT ILLNESS:  [name] is a 56 year old woman with multiple sclerosis. Due to multiple skin cancers (squamous cell and basal cell) she was switched from Gilenya to Copaxone. She was stable on brand-name Copaxone the insurance company made her go to generic this year.  Yesterday, she had the onset of right leg weakness and is even weaker this morning. She went to work but is having difficulty and called our office concerned about an exacerbation. The weakness is only in the right leg. Specifically she does not note any right arm or left leg weakness. There is no new weakness in the face. There is no numbness in the limbs or face.   She is having trouble walking due to the weakness. Her is a significant right foot drop.  While getting IV Solu-Medrol, she developed a rash and a dry cough she did not report any shortness of breath and vital signs remained stable. She transiently improved with 25 mg IV Benadryl and improved further with an additional 25 mg of Benadryl and IV Pepcid.      ROS:  Out of a complete 14 system review of symptoms, the patient complains only of the following symptoms, and all other reviewed systems are negative.  She has some fatigue.   She has ADD.  Mild GERD. Mild depression improved on fluoxetine   ALLERGIES: Allergies  Allergen Reactions  . Cephalexin Hives and Itching    HOME MEDICATIONS:  Current Outpatient Prescriptions:  .  cyclobenzaprine (FLEXERIL) 5 MG tablet, Take 1 tablet (5 mg total) by mouth 3 (three) times daily as needed for muscle spasms., Disp: 30 tablet, Rfl: 1 .  FLUoxetine (PROZAC) 40 MG capsule, Take 1 capsule (40 mg total) by mouth daily., Disp: 90 capsule, Rfl: 3 .  Glatiramer Acetate (COPAXONE) 40 MG/ML SOSY, Inject 40 mg into the skin 3 (three) times a week., Disp: , Rfl:   .  lisdexamfetamine (VYVANSE) 60 MG capsule, Take 1 capsule (60 mg total) by mouth every morning., Disp: 90 capsule, Rfl: 0 .  mirabegron ER (MYRBETRIQ) 50 MG TB24 tablet, Take 1 tablet (50 mg total) by mouth daily., Disp: 30 tablet, Rfl: 11 .  oxybutynin (DITROPAN-XL) 10 MG 24 hr tablet, Take 1 tablet (10 mg total) by mouth daily., Disp: 30 tablet, Rfl: 11 .  pantoprazole (PROTONIX) 40 MG tablet, Take 40 mg by mouth daily., Disp: , Rfl:  .  predniSONE (DELTASONE) 50 MG tablet, 10 po x 3 day (500 g) then 8 po x 1d then 6 po x 1d, then 4 po x 1d, then 2 po x1d, then 1 po x 1d, then 1/2 po x 1d, Disp: 52 tablet, Rfl: 0  PAST MEDICAL HISTORY: Past Medical History:  Diagnosis Date  . ADD (attention deficit disorder with hyperactivity)   . Arthritis   . Cervical dysplasia   . GERD (gastroesophageal reflux disease)   . Mitral valve prolapse   . Multiple sclerosis ([place])   . Multiple sclerosis ([place])   . Tendonitis in rigtht wrist     PAST SURGICAL HISTORY: Past Surgical History:  Procedure Laterality Date  . ABDOMINAL HYSTERECTOMY  2004   ovaries retained  . BACK SURGERY    . TUBAL LIGATION      FAMILY HISTORY: Family History  Problem Relation Age of Onset  . Depression Mother   .  Stroke Mother   . Heart disease Mother   . Dementia Mother   . Cancer Neg Hx   . Diabetes Neg Hx     SOCIAL HISTORY:  Social History   Social History  . Marital status: Divorced    Spouse name: N/A  . Number of children: N/A  . Years of education: N/A   Occupational History  . Not on file.   Social History Main Topics  . Smoking status: Current Some Day Smoker    Packs/day: 0.25    Types: Cigarettes  . Smokeless tobacco: Never Used  . Alcohol use No     Comment: Occasioanl glass of wine   . Drug use: No  . Sexual activity: Not Currently   Other Topics Concern  . Not on file   Social History Narrative  . No narrative on file     PHYSICAL EXAM   (Vital signs are on the  intra-fusion flowsheet)   General: The patient is well-developed and well-nourished and in no acute distress   Skin: Extremities towards the end of the IV Solu-Medrol infusion showed a mild macular rash.     Heart/Lungs:  Regular rate and rhythm, normal S1 and S2 with no murmurs. Lungs are clear.  Neurologic Exam  Mental status: The patient is alert and oriented x 3 at the time of the examination. The patient has apparent normal recent and remote memory, with an apparently normal attention span and concentration ability.   Speech is normal.  Cranial nerves: Extraocular movements are full. Facial strength and sensation is normal..  The tongue is midline, and the patient has symmetric elevation of the soft palate. No obvious hearing deficits are noted.  Motor:  Muscle bulk is normal.   Tone is slightly increased in legs. The strength is 2+/5 in the right leg. 5/5 elsewhere..   Sensory: Sensory testing is intact to touch 4 .  Coordination: Cerebellar testing reveals good finger-nose-finger . She is unable to do heel-to-shin with right leg and slightly reduced left.  Gait and station: Station is normal.   She has a significant right foot drop and gait is wide and needs support. She cannot tandem walk.. Romberg is negative.   Reflexes: Deep tendon reflexes are symmetric and normal bilaterally.     DIAGNOSTIC DATA (LABS, IMAGING, TESTING) - I reviewed patient records, labs, notes, testing and imaging myself where available.  Lab Results  Component Value Date   WBC 5.9 01/05/2015   HGB 12.3 01/05/2015   HCT 37.8 01/05/2015   MCV 87 01/05/2015   PLT 286 01/05/2015      Component Value Date/Time   NA 141 08/15/2015 1616   K 4.7 08/15/2015 1616   CL 101 08/15/2015 1616   CO2 26 08/15/2015 1616   GLUCOSE 90 08/15/2015 1616   BUN 19 08/15/2015 1616   CREATININE 0.54 (L) 08/15/2015 1616   CALCIUM 9.2 08/15/2015 1616   PROT 6.5 08/15/2015 1616   ALBUMIN 4.1 08/15/2015 1616   AST  16 08/15/2015 1616   ALT 24 08/15/2015 1616   ALKPHOS 95 08/15/2015 1616   BILITOT <0.2 08/15/2015 1616   GFRNONAA 107 08/15/2015 1616   GFRAA 124 08/15/2015 1616       ASSESSMENT AND PLAN  MS (multiple sclerosis) (HCC)  Ataxic gait  Right leg weakness  Attention deficit disorder, unspecified hyperactivity presence   1.   Today we infuse 1 g of IV Solu-Medrol. She appears to be having an allergic reaction to the  IV Solu-Medrol (despite having multiple times in past). We will do high dose oral prednisone taper and we will also get some Decadron to use IV if needed. 2.    We will try to get her back on brand name Copaxone as she was more stable on brand than generic. We are trying to avoid immunosuppressant therapy due to her multiple skin cancers. 3.    She will return to see [place] as scheduled and call sooner if any new or worsening neurologic symptoms.   Consuelo Thayne A. Felecia Shelling, MD, PhD, Charlynn Grimes 09/23/9792, 80:16 AM Certified in Neurology, Clinical Neurophysiology, Sleep Medicine, Pain Medicine and Neuroimaging  [facility] [address], [place], [place] [address] [phone]

## 2016-12-24 ENCOUNTER — Telehealth: Payer: Self-pay | Admitting: Neurology

## 2016-12-24 DIAGNOSIS — R29898 Other symptoms and signs involving the musculoskeletal system: Secondary | ICD-10-CM

## 2016-12-24 NOTE — Telephone Encounter (Signed)
Pt called the office she needs to give RN wording on FMLA forms. Also she wants Dr Felecia Shelling to order MRI lumbar. Please call to discuss. pls call work number prior to 3:15, after [phone]

## 2016-12-24 NOTE — Telephone Encounter (Signed)
MRI ordered. I have FMLA paperwork and will look at tomorrow.

## 2016-12-24 NOTE — Telephone Encounter (Signed)
Thank you for looking for the FMLA  Ok to order MRI "Right foot weakness/numbness"

## 2016-12-24 NOTE — Addendum Note (Signed)
Addended by: Lester Bullhead A on: 12/24/2016 05:00 PM   Modules accepted: Orders

## 2016-12-24 NOTE — Telephone Encounter (Signed)
I called pt. She reports that a new FMLA form was sent to our office on Tuesday, 12/18/2016. It needs to say "Ms. Hirota may miss 1 week of work per month for 6 months" instead of the once every 3 months. I do not have this FMLA form but will look for it.  Pt also is requesting an MRI lumbar study to be ordered by Dr. Felecia Shelling. Pt is scheduled to have her MRI brain tomorrow.

## 2016-12-25 ENCOUNTER — Ambulatory Visit
Admission: RE | Admit: 2016-12-25 | Discharge: 2016-12-25 | Disposition: A | Discharge status: Home / Self Care | Payer: 59 | Source: Ambulatory Visit | Attending: Neurology | Admitting: Neurology

## 2016-12-25 DIAGNOSIS — G35 Multiple sclerosis: Secondary | ICD-10-CM | POA: Diagnosis not present

## 2016-12-25 DIAGNOSIS — R260 Ataxic gait: Secondary | ICD-10-CM

## 2016-12-25 MED ORDER — GADOBENATE DIMEGLUMINE 529 MG/ML IV SOLN
18.0000 mL | Freq: Once | INTRAVENOUS | Status: AC | PRN
  Administered 2016-12-25: 18 mL via INTRAVENOUS

## 2016-12-25 NOTE — Telephone Encounter (Signed)
Dr. Felecia Shelling signed FMLA paperwork, a copy was made for Faith, RN, and then the original was sent to MR for processing. Should be sent to [employer] and a scanned copy to go to [employer].

## 2016-12-25 NOTE — Telephone Encounter (Signed)
Completed FMLA form, given to Dr. Felecia Shelling for review and signature.

## 2016-12-26 ENCOUNTER — Telehealth: Payer: Self-pay | Admitting: Neurology

## 2016-12-26 NOTE — Telephone Encounter (Signed)
LMOM for patient to return my call in reference to completed FMLA paperwork.

## 2016-12-26 NOTE — Telephone Encounter (Signed)
Left messages with voicemail about MRI. It showed an acute lesion that might explain her right-sided symptoms.

## 2016-12-27 NOTE — Telephone Encounter (Signed)
I spoke with [name] about the MRI results. There is one enhancing lesion in the leftpericallosal white matter. There is another left temporal focus that was not present on the previous scan.  I'm not certain that the enhancing focus explains her symptoms. As it should not affect strength. The optic nerves appeared fine. The lesion does not explain her visual changes.   It is probable that the weakness is from a spinal plaque and optic neuritis doen not always show on MRI  If she has another exacerbation while on Copaxone we will need to switch her to a more effective medication. Due to her many skin cancers, I would most consider Tysabri over the other infusions\\  She also had a comment about her FMLA papers. When they arrive we need to put down but she may be out one week a month due to her MS.

## 2017-01-03 MED FILL — PANTOPRAZOLE SOD DR 40 MG T: 40 | 90 days supply | Qty: 90 | Fill #2

## 2017-01-05 ENCOUNTER — Ambulatory Visit
Admission: RE | Admit: 2017-01-05 | Discharge: 2017-01-05 | Disposition: A | Discharge status: Home / Self Care | Payer: 59 | Source: Ambulatory Visit | Attending: Neurology | Admitting: Neurology

## 2017-01-05 DIAGNOSIS — R29898 Other symptoms and signs involving the musculoskeletal system: Secondary | ICD-10-CM

## 2017-01-05 DIAGNOSIS — M5126 Other intervertebral disc displacement, lumbar region: Secondary | ICD-10-CM | POA: Diagnosis not present

## 2017-01-05 MED ORDER — GADOBENATE DIMEGLUMINE 529 MG/ML IV SOLN
17.0000 mL | Freq: Once | INTRAVENOUS | Status: AC | PRN
  Administered 2017-01-05: 17 mL via INTRAVENOUS

## 2017-01-08 ENCOUNTER — Telehealth: Payer: Self-pay | Admitting: Neurology

## 2017-01-08 NOTE — Telephone Encounter (Signed)
I left a voicemail messages on her regular phone and mobile phone.  Right eye of the lumbar spine did show a new MS lesion at the bottom of the spinal cord that could explain her new symptoms. Additionally she has significant disc changes at several levels which have worsened since her 2017 MRI. At L4-L5 she has moderate spinal stenosis, worse to the right

## 2017-01-09 ENCOUNTER — Other Ambulatory Visit: Payer: Self-pay | Admitting: Neurology

## 2017-01-09 ENCOUNTER — Telehealth: Payer: Self-pay | Admitting: *Deleted

## 2017-01-09 ENCOUNTER — Telehealth: Payer: Self-pay | Admitting: Neurology

## 2017-01-09 DIAGNOSIS — G35 Multiple sclerosis: Secondary | ICD-10-CM

## 2017-01-09 DIAGNOSIS — Z79899 Other long term (current) drug therapy: Secondary | ICD-10-CM

## 2017-01-09 MED ORDER — FLUOXETINE HCL 20 MG PO CAPS: ORAL_CAPSULE | ORAL | 3 refills | Status: DC

## 2017-01-09 MED ORDER — FLUOXETINE HCL 60 MG PO TABS: 60.0000 mg | ORAL_TABLET | Freq: Every day | ORAL | 4 refills | Status: DC

## 2017-01-09 MED FILL — FLUoxetine HCL 20 MG CAPS: 20 | 90 days supply | Qty: 270 | Fill #0

## 2017-01-09 NOTE — Telephone Encounter (Signed)
Pt returned providers call. She can be reached at [phone].

## 2017-01-09 NOTE — Telephone Encounter (Signed)
Pt. takes Fluoxetine 60mg  once daily.  Per Alvie Heidelberg at [facility] Pharmacy, pt's copay is $188, while copay for Fluoxetine 20mg , 3 capsules daily is $5.  Verbal ok given to change to 20mg  caps, 3 daily/fim

## 2017-01-09 NOTE — Progress Notes (Signed)
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 DMT decisions have been difficult in her case as she also has had recurrent skin cancers. Copaxone is the safest for her skin cancer but clearly is not controlling her MS and she has had 2 exacerbations with this last one being a significant 1.   We discussed options and have decided to have her switch from Copaxone to ocrelizumab.

## 2017-01-09 NOTE — Telephone Encounter (Signed)
I spoke to [place] about the MRI results showing a subacute MS plaque to the right adjacent to T12 that could explain her right leg symptoms as well as her bladder and bowel issues.  We had a conversation about treatment. I am concerned that she has had 2 exacerbations on Copaxone (and I don't think this would be do to it being generic versus brand). From the MS perspective, my recommendation would be to go on one of the IV medications to get a better trial of her worsening MS. DMT decisions have been difficult due to her recurrent skin cancers. Copaxone is definitely safer from that perspective but it has not controlled her MS.  I think ocrelizumab would be the best option for a stronger medication.

## 2017-01-09 NOTE — Telephone Encounter (Signed)
Message given to RAS/fim

## 2017-01-10 ENCOUNTER — Other Ambulatory Visit (INDEPENDENT_AMBULATORY_CARE_PROVIDER_SITE_OTHER): Payer: Self-pay

## 2017-01-10 ENCOUNTER — Encounter: Payer: Self-pay | Admitting: *Deleted

## 2017-01-10 DIAGNOSIS — G35 Multiple sclerosis: Secondary | ICD-10-CM

## 2017-01-10 DIAGNOSIS — Z79899 Other long term (current) drug therapy: Secondary | ICD-10-CM

## 2017-01-10 DIAGNOSIS — Z0289 Encounter for other administrative examinations: Secondary | ICD-10-CM

## 2017-01-10 NOTE — Telephone Encounter (Signed)
Patient called office to see if she is able to receive a flu shot or if she needs to wait and how long.  New drug info states not to within 6 weeks.  Please call

## 2017-01-10 NOTE — Telephone Encounter (Signed)
I spoke with Claiborne Billings and advised ok for flu shot now.  Ocrevus srf has not been sent in yet (she has to come in and sign it), so she should still have enough time to get flu shot this wk. and receive benefit from it.  She verbalized understanding of same, sts. will try to  come in today or tomorrow for labs and to sign srf/fim

## 2017-01-11 LAB — CBC WITH DIFFERENTIAL/PLATELET
Basophils Absolute: 0.1 10*3/uL (ref 0.0–0.2)
Basos: 1 %
EOS (ABSOLUTE): 0.5 10*3/uL — ABNORMAL HIGH (ref 0.0–0.4)
Eos: 5 %
Hematocrit: 38.8 % (ref 34.0–46.6)
Hemoglobin: 12.6 g/dL (ref 11.1–15.9)
Immature Grans (Abs): 0 10*3/uL (ref 0.0–0.1)
Immature Granulocytes: 0 %
Lymphocytes Absolute: 4 10*3/uL — ABNORMAL HIGH (ref 0.7–3.1)
Lymphs: 45 %
MCH: 28.6 pg (ref 26.6–33.0)
MCHC: 32.5 g/dL (ref 31.5–35.7)
MCV: 88 fL (ref 79–97)
Monocytes Absolute: 0.5 10*3/uL (ref 0.1–0.9)
Monocytes: 6 %
Neutrophils Absolute: 3.8 10*3/uL (ref 1.4–7.0)
Neutrophils: 43 %
Platelets: 361 10*3/uL (ref 150–379)
RBC: 4.4 x10E6/uL (ref 3.77–5.28)
RDW: 14.3 % (ref 12.3–15.4)
WBC: 8.8 10*3/uL (ref 3.4–10.8)

## 2017-01-11 LAB — HEPATIC FUNCTION PANEL
ALT: 18 IU/L (ref 0–32)
AST: 17 IU/L (ref 0–40)
Albumin: 4 g/dL (ref 3.5–5.5)
Alkaline Phosphatase: 94 IU/L (ref 39–117)
Bilirubin Total: <0.2 mg/dL (ref 0.0–1.2)
Bilirubin, Direct: 0.08 mg/dL (ref 0.00–0.40)
Total Protein: 6.5 g/dL (ref 6.0–8.5)

## 2017-01-11 LAB — HEPATITIS B SURFACE ANTIBODY,QUALITATIVE: Hep B Surface Ab, Qual: REACTIVE

## 2017-01-11 LAB — HEPATITIS B CORE ANTIBODY, TOTAL: Hep B Core Total Ab: POSITIVE — AB

## 2017-01-11 LAB — HEPATITIS B SURFACE ANTIGEN: Hepatitis B Surface Ag: NEGATIVE

## 2017-01-15 LAB — QUANTIFERON IN TUBE
QFT TB AG MINUS NIL VALUE: 0.01 IU/mL
QUANTIFERON MITOGEN VALUE: 6.78 IU/mL
QUANTIFERON TB AG VALUE: 0.12 IU/mL
QUANTIFERON TB GOLD: NEGATIVE
Quantiferon Nil Value: 0.11 IU/mL

## 2017-01-15 LAB — QUANTIFERON TB GOLD ASSAY (BLOOD)

## 2017-01-16 ENCOUNTER — Telehealth: Payer: Self-pay | Admitting: *Deleted

## 2017-01-16 NOTE — Telephone Encounter (Signed)
-----   Message from Britt Bottom, MD sent at 01/15/2017  7:20 PM EDT ----- Labs are fine. If we have not already done so, please turn in the ocrelizumab form.

## 2017-01-16 NOTE — Telephone Encounter (Signed)
LMOM that per RAS, lab work done in our office is fine.  We have turned Ocrevus srf in to [place] in the infusion suite/fim

## 2017-01-23 DIAGNOSIS — C44729 Squamous cell carcinoma of skin of left lower limb, including hip: Secondary | ICD-10-CM | POA: Diagnosis not present

## 2017-01-24 MED FILL — FLUoxetine HCL 40 MG CAPS: 40 | 90 days supply | Qty: 90 | Fill #2

## 2017-01-31 ENCOUNTER — Ambulatory Visit (INDEPENDENT_AMBULATORY_CARE_PROVIDER_SITE_OTHER): Payer: 59 | Admitting: Psychiatry

## 2017-01-31 ENCOUNTER — Encounter (HOSPITAL_COMMUNITY): Payer: Self-pay | Admitting: Psychiatry

## 2017-01-31 DIAGNOSIS — G35 Multiple sclerosis: Secondary | ICD-10-CM

## 2017-01-31 DIAGNOSIS — Z818 Family history of other mental and behavioral disorders: Secondary | ICD-10-CM

## 2017-01-31 DIAGNOSIS — F329 Major depressive disorder, single episode, unspecified: Secondary | ICD-10-CM | POA: Diagnosis not present

## 2017-01-31 DIAGNOSIS — F900 Attention-deficit hyperactivity disorder, predominantly inattentive type: Secondary | ICD-10-CM

## 2017-01-31 DIAGNOSIS — F17210 Nicotine dependence, cigarettes, uncomplicated: Secondary | ICD-10-CM

## 2017-01-31 DIAGNOSIS — Z566 Other physical and mental strain related to work: Secondary | ICD-10-CM

## 2017-01-31 MED ORDER — LISDEXAMFETAMINE DIMESYLATE 60 MG PO CAPS: 60.0000 mg | ORAL_CAPSULE | ORAL | 0 refills | Status: DC

## 2017-01-31 NOTE — Progress Notes (Signed)
[place] MD/PA/NP OP Progress Note  01/31/2017 4:20 PM [name]  MRN:  [id number]  Chief Complaint:  I am sad because I have 2 new lesions for multiple sclerosis.  HPI: [name] came for her follow-up appointment.  She is compliant with Vyvanse is helping her attention, focus and multitasking.  However she recently find out that there are 2 new lesions for multiple sclerosis.  There were found on his spinal cord and brain.  She she was very sad and disappointed and recently her neurologist increased her Prozac to 60 mg.  She is feeling better since the dose increase.  She sleeping good.  She has not taken Xanax in a while.  Her attention and concentration is better with the Vyvanse.  Her job is stressful but there has been no recent major problem.  She denies any suicidal thoughts or homicidal thought.  She works at the radiology department.  Patient denies drinking alcohol or using any illegal substances.  She is hoping that new medication for multiple sclerosis may help her condition.  She has incontinence.  Her appetite is okay.  Her vital signs are stable.  Patient denies any paranoia, hallucination or any self abusive behavior.  Visit Diagnosis:    ICD-10-CM   1. Attention deficit hyperactivity disorder (ADHD), predominantly inattentive type F90.0 lisdexamfetamine (VYVANSE) 60 MG capsule    Past Psychiatric History: Reviewed. Patient has history of depression started when she was in the process of divorce. She was given Zoloft and then Prozac. She also given Focalin, Ritalin and Strattera. Patient denies any history of suicidal attempt or any psychosis.  Past Medical History:  Past Medical History:  Diagnosis Date  . ADD (attention deficit disorder with hyperactivity)   . Arthritis   . Cervical dysplasia   . GERD (gastroesophageal reflux disease)   . Mitral valve prolapse   . Multiple sclerosis ([name])   . Multiple sclerosis ([name])   . Tendonitis in rigtht wrist     Past Surgical  History:  Procedure Laterality Date  . ABDOMINAL HYSTERECTOMY  2004   ovaries retained  . BACK SURGERY    . TUBAL LIGATION      Family Psychiatric History: Reviewed.  Family History:  Family History  Problem Relation Age of Onset  . Depression Mother   . Stroke Mother   . Heart disease Mother   . Dementia Mother   . Cancer Neg Hx   . Diabetes Neg Hx     Social History:  Social History   Social History  . Marital status: Divorced    Spouse name: N/A  . Number of children: N/A  . Years of education: N/A   Social History Main Topics  . Smoking status: Current Some Day Smoker    Packs/day: 0.25    Types: Cigarettes  . Smokeless tobacco: Never Used  . Alcohol use No     Comment: Occasioanl glass of wine   . Drug use: No  . Sexual activity: Not Currently   Other Topics Concern  . None   Social History Narrative  . None    Allergies:  Allergies  Allergen Reactions  . Methylprednisolone Hives  . Solu-Medrol [Methylprednisolone Acetate] Hives and Itching  . Cephalexin Hives and Itching  . Cephalexin Itching and Hives    Metabolic Disorder Labs: No results found for: HGBA1C, MPG No results found for: PROLACTIN No results found for: CHOL, TRIG, HDL, CHOLHDL, VLDL, LDLCALC No results found for: TSH  Therapeutic Level Labs: No results  found for: LITHIUM No results found for: VALPROATE No components found for:  CBMZ  Current Medications: Current Outpatient Prescriptions  Medication Sig Dispense Refill  . FLUoxetine (PROZAC) 20 MG capsule Take 3 capsules daily 270 capsule 3  . Glatiramer Acetate (COPAXONE) 40 MG/ML SOSY Inject 40 mg into the skin 3 (three) times a week.    . lisdexamfetamine (VYVANSE) 60 MG capsule Take 1 capsule (60 mg total) by mouth every morning. 90 capsule 0  . mirabegron ER (MYRBETRIQ) 50 MG TB24 tablet Take 1 tablet (50 mg total) by mouth daily. 30 tablet 11  . ocrelizumab 600 mg in sodium chloride 0.9 % 500 mL Inject 600 mg into the  vein every 6 (six) months.    [name] oxybutynin (DITROPAN-XL) 10 MG 24 hr tablet Take 1 tablet (10 mg total) by mouth daily. 30 tablet 11  . pantoprazole (PROTONIX) 40 MG tablet Take 40 mg by mouth daily.     No current facility-administered medications for this visit.      Musculoskeletal: Strength & Muscle Tone: within normal limits Gait & Station: normal Patient leans: N/A  Psychiatric Specialty Exam: Review of Systems  Constitutional: Negative.   Genitourinary: Positive for urgency.  Skin: Negative.  Negative for itching and rash.    Blood pressure 132/74, pulse 84, height 5' 7.5" (1.715 m), weight 186 lb 3.2 oz (84.5 kg).Body mass index is 28.73 kg/m.  General Appearance: Casual  Eye Contact:  Good  Speech:  Clear and Coherent  Volume:  Normal  Mood:  Dysphoric  Affect:  Appropriate  Thought Process:  Goal Directed  Orientation:  Full (Time, Place, and Person)  Thought Content: Logical and Rumination   Suicidal Thoughts:  No  Homicidal Thoughts:  No  Memory:  Immediate;   Good Recent;   Good Remote;   Good  Judgement:  Good  Insight:  Good  Psychomotor Activity:  Normal  Concentration:  Concentration: Good and Attention Span: Good  Recall:  Good  Fund of Knowledge: Good  Language: Good  Akathisia:  No  Handed:  Right  AIMS (if indicated): not done  Assets:  Communication Skills Desire for Improvement Housing Resilience Talents/Skills Transportation Vocational/Educational  ADL's:  Intact  Cognition: WNL  Sleep:  Good   Screenings: PHQ2-9     Office Visit from 03/13/2016 in [facility]  PHQ-2 Total Score  0       Assessment and Plan: Attention deficit disorder, inattentive type.  Depressive disorder NOS.  Reassurance given.  Patient is taking Prozac 60 mg which was recently increased by her neurologist.  She is hoping the new medication may help her multiple sclerosis symptoms.  Continue Vyvanse 60 mg daily.  She has not taken  Xanax in a while and does not need a new prescription.  Recommended to call us back if she has any question, concern or if she feels worsening of the symptom.  Follow-up in 3 months.   [name] T., MD 01/31/2017, 4:20 PM

## 2017-02-14 ENCOUNTER — Telehealth: Payer: Self-pay | Admitting: Neurology

## 2017-02-14 NOTE — Telephone Encounter (Signed)
Rhiannon from [place]  Is calling for the Start form for  pt to begin Ocrevus, the 1st form was not clear enough to read and is asking that it be resent back in.it can be faxed to [phone] if a call needs to be made please call [phone] xt [phone]

## 2017-02-14 NOTE — Telephone Encounter (Signed)
Message printed and given to Tina in the infusion suite/fim 

## 2017-03-06 DIAGNOSIS — G35 Multiple sclerosis: Secondary | ICD-10-CM | POA: Diagnosis not present

## 2017-03-08 MED FILL — VYVANSE 60 MG CAPSULE: 60 | 90 days supply | Qty: 90 | Fill #0

## 2017-03-11 ENCOUNTER — Telehealth: Payer: Self-pay | Admitting: Neurology

## 2017-03-11 NOTE — Telephone Encounter (Signed)
LMOM that she does not have to wait to have skin ca removed, and no special precautions needed.  Healing time should not be delayed due to [place].  She does not need to return this call unless she has additional quesitons/fim

## 2017-03-11 NOTE — Telephone Encounter (Signed)
Pt called needs to know how long to wait after ocrevus infusion to have skin cancer removed? She's had half of the 1st infusion. The 2nd half is 03/21/17. Please call to advise

## 2017-03-13 ENCOUNTER — Telehealth: Payer: Self-pay | Admitting: *Deleted

## 2017-03-13 NOTE — Telephone Encounter (Signed)
FMLA form approving absences for 11/09/16, 11/12/16, 11/13/16, 11/14/16, 11/15/16, 11/16/16, 11/19/16, 03/06/17, 03/07/17, 05/21/17 completed and faxed to [employer], fax# [phone]/LKH

## 2017-03-21 DIAGNOSIS — G35 Multiple sclerosis: Secondary | ICD-10-CM | POA: Diagnosis not present

## 2017-04-04 DIAGNOSIS — C44729 Squamous cell carcinoma of skin of left lower limb, including hip: Secondary | ICD-10-CM | POA: Diagnosis not present

## 2017-04-08 DIAGNOSIS — C44722 Squamous cell carcinoma of skin of right lower limb, including hip: Secondary | ICD-10-CM | POA: Diagnosis not present

## 2017-04-08 DIAGNOSIS — C44729 Squamous cell carcinoma of skin of left lower limb, including hip: Secondary | ICD-10-CM | POA: Diagnosis not present

## 2017-04-15 MED FILL — PANTOPRAZOLE SOD DR 40 MG T: 40 | 90 days supply | Qty: 90 | Fill #3

## 2017-05-02 ENCOUNTER — Other Ambulatory Visit: Payer: Self-pay

## 2017-05-02 ENCOUNTER — Encounter (HOSPITAL_COMMUNITY): Payer: Self-pay | Admitting: Psychiatry

## 2017-05-02 ENCOUNTER — Ambulatory Visit (INDEPENDENT_AMBULATORY_CARE_PROVIDER_SITE_OTHER): Payer: 59 | Admitting: Psychiatry

## 2017-05-02 DIAGNOSIS — F419 Anxiety disorder, unspecified: Secondary | ICD-10-CM

## 2017-05-02 DIAGNOSIS — F17210 Nicotine dependence, cigarettes, uncomplicated: Secondary | ICD-10-CM

## 2017-05-02 DIAGNOSIS — F900 Attention-deficit hyperactivity disorder, predominantly inattentive type: Secondary | ICD-10-CM

## 2017-05-02 DIAGNOSIS — Z818 Family history of other mental and behavioral disorders: Secondary | ICD-10-CM

## 2017-05-02 DIAGNOSIS — F332 Major depressive disorder, recurrent severe without psychotic features: Secondary | ICD-10-CM | POA: Diagnosis not present

## 2017-05-02 DIAGNOSIS — Z810 Family history of intellectual disabilities: Secondary | ICD-10-CM

## 2017-05-02 MED ORDER — LISDEXAMFETAMINE DIMESYLATE 60 MG PO CAPS: 60.0000 mg | ORAL_CAPSULE | ORAL | 0 refills | Status: DC

## 2017-05-02 NOTE — Progress Notes (Signed)
BH MD/PA/NP OP Progress Note  05/02/2017 4:19 PM [name]  MRN:  [id number]  Chief Complaint: I am doing good.  Sometime I feel tired.  HPI: She came for her follow-up appointment.  She is compliant with Vyvanse and Prozac which is given by her primary care physician.  She is handling better her chronic illness.  She is pleased that no more new lesions of multiple sclerosis but her previous lesion cause permanent damage.  She has incontinence and numbness and tingling in her leg.  She is pleased that she has no major panic attack.  She has not taken Xanax since the last visit.  Her attention consultation is good.  She has no tremors or shakes.  Despite job stress but she is handling much better.  Recently few more people were hired that helps.  Patient works at a radiology department.  Patient denies drinking alcohol or using any illegal substances.  Patient denies any mania, psychosis or any hallucination.  Her energy level is fair.  Her vital signs are stable.  Visit Diagnosis:    ICD-10-CM   1. Attention deficit hyperactivity disorder (ADHD), predominantly inattentive type F90.0 lisdexamfetamine (VYVANSE) 60 MG capsule    Past Psychiatric History: Reviewed. Patient has history of depression started when she was in the process of divorce. She was given Zoloft and then Prozac. She also given Focalin, Ritalin and Strattera. Patient denies any history of suicidal attempt or any psychosis.  Past Medical History:  Past Medical History:  Diagnosis Date  . ADD (attention deficit disorder with hyperactivity)   . Arthritis   . Cervical dysplasia   . GERD (gastroesophageal reflux disease)   . Mitral valve prolapse   . Multiple sclerosis ([place])   . Multiple sclerosis ([place])   . Tendonitis in rigtht wrist     Past Surgical History:  Procedure Laterality Date  . ABDOMINAL HYSTERECTOMY  2004   ovaries retained  . BACK SURGERY    . TUBAL LIGATION      Family Psychiatric History:  Reviewed.  Family History:  Family History  Problem Relation Age of Onset  . Depression Mother   . Stroke Mother   . Heart disease Mother   . Dementia Mother   . Cancer Neg Hx   . Diabetes Neg Hx     Social History:  Social History   Socioeconomic History  . Marital status: Divorced    Spouse name: Not on file  . Number of children: Not on file  . Years of education: Not on file  . Highest education level: Not on file  Social Needs  . Financial resource strain: Not on file  . Food insecurity - worry: Not on file  . Food insecurity - inability: Not on file  . Transportation needs - medical: Not on file  . Transportation needs - non-medical: Not on file  Occupational History  . Not on file  Tobacco Use  . Smoking status: Current Some Day Smoker    Packs/day: 0.25    Types: Cigarettes  . Smokeless tobacco: Never Used  Substance and Sexual Activity  . Alcohol use: No    Alcohol/week: 0.0 oz    Comment: Occasioanl glass of wine   . Drug use: No  . Sexual activity: Not Currently  Other Topics Concern  . Not on file  Social History Narrative  . Not on file    Allergies:  Allergies  Allergen Reactions  . Methylprednisolone Hives  . Solu-Medrol [Methylprednisolone Acetate] Hives  and Itching  . Cephalexin Hives and Itching  . Cephalexin Itching and Hives    Metabolic Disorder Labs: No results found for: HGBA1C, MPG No results found for: PROLACTIN No results found for: CHOL, TRIG, HDL, CHOLHDL, VLDL, LDLCALC No results found for: TSH  Therapeutic Level Labs: No results found for: LITHIUM No results found for: VALPROATE No components found for:  CBMZ  Current Medications: Current Outpatient Medications  Medication Sig Dispense Refill  . FLUoxetine (PROZAC) 20 MG capsule Take 3 capsules daily 270 capsule 3  . Glatiramer Acetate (COPAXONE) 40 MG/ML SOSY Inject 40 mg into the skin 3 (three) times a week.    . lisdexamfetamine (VYVANSE) 60 MG capsule Take 1  capsule (60 mg total) by mouth every morning. 90 capsule 0  . mirabegron ER (MYRBETRIQ) 50 MG TB24 tablet Take 1 tablet (50 mg total) by mouth daily. 30 tablet 11  . ocrelizumab 600 mg in sodium chloride 0.9 % 500 mL Inject 600 mg into the vein every 6 (six) months.    [name] oxybutynin (DITROPAN-XL) 10 MG 24 hr tablet Take 1 tablet (10 mg total) by mouth daily. 30 tablet 11  . pantoprazole (PROTONIX) 40 MG tablet Take 40 mg by mouth daily.     No current facility-administered medications for this visit.      Musculoskeletal: Strength & Muscle Tone: within normal limits Gait & Station: normal Patient leans: N/A  Psychiatric Specialty Exam: ROS  Blood pressure 135/87, pulse 67, temperature 98.4 F (36.9 C), temperature source Oral, resp. rate 17, height 5\' 7"  (1.702 m), weight 189 lb (85.7 kg), SpO2 97 %.Body mass index is 29.6 kg/m.  General Appearance: Casual  Eye Contact:  Good  Speech:  Clear and Coherent  Volume:  Normal  Mood:  Anxious  Affect:  Appropriate  Thought Process:  Goal Directed  Orientation:  Full (Time, Place, and Person)  Thought Content: Logical   Suicidal Thoughts:  No  Homicidal Thoughts:  No  Memory:  Immediate;   Good Recent;   Good Remote;   Good  Judgement:  Good  Insight:  Good  Psychomotor Activity:  Normal  Concentration:  Concentration: Good and Attention Span: Good  Recall:  Good  Fund of Knowledge: Good  Language: Good  Akathisia:  No  Handed:  Right  AIMS (if indicated): not done  Assets:  Communication Skills Desire for Improvement Intimacy Physical Health Social Support Talents/Skills  ADL's:  Intact  Cognition: WNL  Sleep:  Good   Screenings: PHQ2-9     Office Visit from 03/13/2016 in [place]  PHQ-2 Total Score  0       Assessment and Plan: Attention deficit disorder, inattentive type.  Major depressive disorder, recurrent.  Anxiety disorder NOS.  Patient doing better on her current medication.   Continue Vyvanse 60 mg daily.  Patient is getting 60 mg Prozac from her primary care physician discussed medication side effects and benefits.  She has not taken Xanax since the last visit.  She does not need a new prescription.  Recommended to call us back if she has any question or any concern.  Follow-up in 3 months.  Kathlee Nations, MD 05/02/2017, 4:19 PM

## 2017-05-13 DIAGNOSIS — J069 Acute upper respiratory infection, unspecified: Secondary | ICD-10-CM | POA: Diagnosis not present

## 2017-06-24 MED FILL — VYVANSE 60 MG CAPSULE: 60 | 90 days supply | Qty: 90 | Fill #0

## 2017-07-04 ENCOUNTER — Telehealth: Payer: Self-pay | Admitting: Neurology

## 2017-07-04 NOTE — Telephone Encounter (Signed)
Pt has called requesting the FMLA paperwork be filled out on her behalf, pt can be reached at work if there are questions re: this request [phone]

## 2017-07-04 NOTE — Telephone Encounter (Signed)
Spoke with Claiborne Billings and let her know I have not received any FMLA paperwork for her.  She thought we kept standard fmla forms here.  Since we don't, she will send those to us/fim

## 2017-07-10 ENCOUNTER — Telehealth: Payer: Self-pay | Admitting: Neurology

## 2017-07-10 NOTE — Telephone Encounter (Signed)
LVM for patient to let her know her FMLA form is ready and that there is a $50 fee prior to filling it out.

## 2017-08-01 ENCOUNTER — Ambulatory Visit (HOSPITAL_COMMUNITY): Payer: Self-pay | Admitting: Psychiatry

## 2017-08-01 DIAGNOSIS — K219 Gastro-esophageal reflux disease without esophagitis: Secondary | ICD-10-CM | POA: Diagnosis not present

## 2017-08-01 DIAGNOSIS — Z5181 Encounter for therapeutic drug level monitoring: Secondary | ICD-10-CM | POA: Diagnosis not present

## 2017-08-01 DIAGNOSIS — Z86010 Personal history of colonic polyps: Secondary | ICD-10-CM | POA: Diagnosis not present

## 2017-08-01 DIAGNOSIS — Z79899 Other long term (current) drug therapy: Secondary | ICD-10-CM | POA: Diagnosis not present

## 2017-08-05 ENCOUNTER — Other Ambulatory Visit: Payer: Self-pay | Admitting: Neurology

## 2017-08-05 MED FILL — PANTOPRAZOLE SOD DR 40 MG T: 40 | 90 days supply | Qty: 90 | Fill #0

## 2017-08-07 MED FILL — FLUoxetine HCL 20 MG CAPS: 20 | 90 days supply | Qty: 270 | Fill #1

## 2017-08-08 ENCOUNTER — Ambulatory Visit (INDEPENDENT_AMBULATORY_CARE_PROVIDER_SITE_OTHER): Payer: 59 | Admitting: Psychiatry

## 2017-08-08 ENCOUNTER — Encounter (HOSPITAL_COMMUNITY): Payer: Self-pay | Admitting: Psychiatry

## 2017-08-08 DIAGNOSIS — F339 Major depressive disorder, recurrent, unspecified: Secondary | ICD-10-CM

## 2017-08-08 DIAGNOSIS — Z818 Family history of other mental and behavioral disorders: Secondary | ICD-10-CM | POA: Diagnosis not present

## 2017-08-08 DIAGNOSIS — F17210 Nicotine dependence, cigarettes, uncomplicated: Secondary | ICD-10-CM | POA: Diagnosis not present

## 2017-08-08 DIAGNOSIS — Z566 Other physical and mental strain related to work: Secondary | ICD-10-CM | POA: Diagnosis not present

## 2017-08-08 DIAGNOSIS — F900 Attention-deficit hyperactivity disorder, predominantly inattentive type: Secondary | ICD-10-CM | POA: Diagnosis not present

## 2017-08-08 DIAGNOSIS — G35 Multiple sclerosis: Secondary | ICD-10-CM

## 2017-08-08 DIAGNOSIS — Z79899 Other long term (current) drug therapy: Secondary | ICD-10-CM

## 2017-08-08 MED ORDER — LISDEXAMFETAMINE DIMESYLATE 60 MG PO CAPS: 60.0000 mg | ORAL_CAPSULE | ORAL | 0 refills | Status: DC

## 2017-08-08 NOTE — Progress Notes (Signed)
[facility] MD/PA/NP OP Progress Note  08/08/2017 4:30 PM [name]  MRN:  [id number]  Chief Complaint: I am doing better.  My focus her attention is good.  HPI: Patient came for her follow-up appointment.  She is compliant with Vyvanse which is helping her attention, focus and multitasking.  She also takes Prozac which is given by Dr. Durward Parcel.  Patient denies any depression, crying spells or any irritability.  She is been taking medication for multiple sclerosis and recently medicines were changed because she find a new lesion in her brain.  She is getting infusion for multiple sclerosis and she is hoping the new medicine may help her.  Overall her energy level is good.  She is sleeping good.  She denies any irritability, anger, mania or any psychosis.  Her job sometimes very stressful but she is handling much better.  Patient works as a Therapist, music.  Patient denies any mania psychosis or any hallucination.  Her appetite is okay.  Her vital signs are stable.  Visit Diagnosis:    ICD-10-CM   1. Attention deficit hyperactivity disorder (ADHD), predominantly inattentive type F90.0 lisdexamfetamine (VYVANSE) 60 MG capsule    Past Psychiatric History: Reviewed. Patient has history of depression started when she was in the process of divorce. She was given Zoloft and then Prozac. She also given Focalin, Ritalin and Strattera. Patient denies any history of suicidal attempt or any psychosis  Past Medical History:  Past Medical History:  Diagnosis Date  . ADD (attention deficit disorder with hyperactivity)   . Arthritis   . Cervical dysplasia   . GERD (gastroesophageal reflux disease)   . Mitral valve prolapse   . Multiple sclerosis ([place])   . Multiple sclerosis ([place])   . Tendonitis in rigtht wrist     Past Surgical History:  Procedure Laterality Date  . ABDOMINAL HYSTERECTOMY  2004   ovaries retained  . BACK SURGERY    . TUBAL LIGATION      Family Psychiatric History: Viewed.  Family  History:  Family History  Problem Relation Age of Onset  . Depression Mother   . Stroke Mother   . Heart disease Mother   . Dementia Mother   . Cancer Neg Hx   . Diabetes Neg Hx     Social History:  Social History   Socioeconomic History  . Marital status: Divorced    Spouse name: Not on file  . Number of children: Not on file  . Years of education: Not on file  . Highest education level: Not on file  Occupational History  . Not on file  Social Needs  . Financial resource strain: Not on file  . Food insecurity:    Worry: Not on file    Inability: Not on file  . Transportation needs:    Medical: Not on file    Non-medical: Not on file  Tobacco Use  . Smoking status: Current Some Day Smoker    Packs/day: 0.25    Types: Cigarettes  . Smokeless tobacco: Never Used  Substance and Sexual Activity  . Alcohol use: No    Alcohol/week: 0.0 oz    Comment: Occasioanl glass of wine   . Drug use: No  . Sexual activity: Not Currently  Lifestyle  . Physical activity:    Days per week: Not on file    Minutes per session: Not on file  . Stress: Not on file  Relationships  . Social connections:    Talks on phone: Not  on file    Gets together: Not on file    Attends religious service: Not on file    Active member of club or organization: Not on file    Attends meetings of clubs or organizations: Not on file    Relationship status: Not on file  Other Topics Concern  . Not on file  Social History Narrative  . Not on file    Allergies:  Allergies  Allergen Reactions  . Methylprednisolone Hives  . Solu-Medrol [Methylprednisolone Acetate] Hives and Itching  . Cephalexin Hives and Itching  . Cephalexin Itching and Hives    Metabolic Disorder Labs: No results found for: HGBA1C, MPG No results found for: PROLACTIN No results found for: CHOL, TRIG, HDL, CHOLHDL, VLDL, LDLCALC No results found for: TSH  Therapeutic Level Labs: No results found for: LITHIUM No results  found for: VALPROATE No components found for:  CBMZ  Current Medications: Current Outpatient Medications  Medication Sig Dispense Refill  . FLUoxetine (PROZAC) 20 MG capsule Take 3 capsules daily 270 capsule 3  . Glatiramer Acetate (COPAXONE) 40 MG/ML SOSY Inject 40 mg into the skin 3 (three) times a week.    . lisdexamfetamine (VYVANSE) 60 MG capsule Take 1 capsule (60 mg total) by mouth every morning. 90 capsule 0  . mirabegron ER (MYRBETRIQ) 50 MG TB24 tablet Take 1 tablet (50 mg total) by mouth daily. 30 tablet 11  . ocrelizumab 600 mg in sodium chloride 0.9 % 500 mL Inject 600 mg into the vein every 6 (six) months.    . pantoprazole (PROTONIX) 40 MG tablet Take 40 mg by mouth daily.    [name] oxybutynin (DITROPAN-XL) 10 MG 24 hr tablet Take 1 tablet (10 mg total) by mouth daily. (Patient not taking: Reported on 08/08/2017) 30 tablet 11   No current facility-administered medications for this visit.      Musculoskeletal: Strength & Muscle Tone: within normal limits Gait & Station: normal Patient leans: N/A  Psychiatric Specialty Exam: ROS  Blood pressure 121/76, pulse 80, height 5\' 7"  (1.702 m), weight 187 lb 9.6 oz (85.1 kg), SpO2 98 %.Body mass index is 29.38 kg/m.  General Appearance: Casual  Eye Contact:  Good  Speech:  Clear and Coherent  Volume:  Normal  Mood:  Euthymic  Affect:  Appropriate  Thought Process:  Goal Directed  Orientation:  Full (Time, Place, and Person)  Thought Content: Logical   Suicidal Thoughts:  No  Homicidal Thoughts:  No  Memory:  Immediate;   Good Recent;   Good Remote;   Good  Judgement:  Good  Insight:  Good  Psychomotor Activity:  Normal  Concentration:  Concentration: Good and Attention Span: Good  Recall:  Good  Fund of Knowledge: Good  Language: Good  Akathisia:  No  Handed:  Right  AIMS (if indicated): not done  Assets:  Communication Skills Desire for [place] Talents/Skills  ADL's:   Intact  Cognition: WNL  Sleep:  Good   Screenings: PHQ2-9     Office Visit from 03/13/2016 in [facility]  PHQ-2 Total Score  0       Assessment and Plan: Attention deficit disorder, inattentive type.  Major depressive disorder, recurrent.  Patient is a stable on her current Vyvanse.  She has no shakes, tremors or any side effects.  Her appetite is okay.  Continue Vyvanse 60 mg daily.  She has not taken Xanax in past 3 months.  She is getting Prozac  60 mg from Dr. Durward Parcel.  Recommended to call us back if she has any question or any concern.  Follow-up in 3 months.   Kathlee Nations, MD 08/08/2017, 4:30 PM

## 2017-09-17 ENCOUNTER — Telehealth: Payer: Self-pay | Admitting: *Deleted

## 2017-09-17 ENCOUNTER — Encounter: Payer: Self-pay | Admitting: Neurology

## 2017-09-17 NOTE — Telephone Encounter (Signed)
LMOM for [name]--per [facility], her insurance will not cover Ocrevus until she has seen RAS and has documented benefit from [place]. Her infusion is due now.  He can see her at [facility] on Monday 09/23/17.  I have reserved this time and asked that she call us to let us know if she can make this appt/fim

## 2017-09-19 DIAGNOSIS — G35 Multiple sclerosis: Secondary | ICD-10-CM | POA: Diagnosis not present

## 2017-09-23 ENCOUNTER — Ambulatory Visit: Payer: Self-pay | Admitting: Neurology

## 2017-09-23 MED FILL — VYVANSE 60 MG CAPSULE: 60 | 90 days supply | Qty: 90 | Fill #0

## 2017-10-16 ENCOUNTER — Ambulatory Visit: Payer: Self-pay | Admitting: Neurology

## 2017-10-16 ENCOUNTER — Ambulatory Visit: Payer: 59 | Admitting: Family Medicine

## 2017-10-16 DIAGNOSIS — M25562 Pain in left knee: Secondary | ICD-10-CM | POA: Diagnosis not present

## 2017-10-16 MED ORDER — HYDROCODONE-ACETAMINOPHEN 5-325 MG PO TABS: 1.0000 | ORAL_TABLET | Freq: Four times a day (QID) | ORAL | 0 refills | Status: DC | PRN

## 2017-10-16 MED ORDER — METHYLPREDNISOLONE ACETATE 40 MG/ML IJ SUSP
40.0000 mg | Freq: Once | INTRAMUSCULAR | Status: AC
  Administered 2017-10-16: 40 mg via INTRA_ARTICULAR

## 2017-10-16 MED ORDER — DICLOFENAC SODIUM 75 MG PO TBEC: 75.0000 mg | DELAYED_RELEASE_TABLET | Freq: Two times a day (BID) | ORAL | 1 refills | Status: DC

## 2017-10-16 MED FILL — DICLOFENAC SODIUM 75 MG TAB: 75 | 30 days supply | Qty: 60 | Fill #0

## 2017-10-16 MED FILL — HYDROCODON-APAP 5-325: 5-325 | 5 days supply | Qty: 20 | Fill #0

## 2017-10-16 NOTE — Patient Instructions (Addendum)
You have a knee effusion - either this by itself is causing your pain or you have a concurrent meniscus tear. You were given a cortisone injection today. Voltaren 75mg  twice a day with food for pain and inflammation. Norco as needed for severe pain. Compression sleeve or ACE wrap if needed to help with the swelling. Out of work the next few days. Call us if you need anything otherwise follow up with me in 1 week.

## 2017-10-20 ENCOUNTER — Encounter: Payer: Self-pay | Admitting: Family Medicine

## 2017-10-20 DIAGNOSIS — M25562 Pain in left knee: Secondary | ICD-10-CM | POA: Insufficient documentation

## 2017-10-20 NOTE — Progress Notes (Signed)
PCP: Patient, No Pcp Per  Subjective:   HPI: Patient is a 57 y.o. female here for left knee pain.  Patient reports for the past couple days she's had worsening posterior left knee pain. No acute injury or trauma. Pain up to 10/10 level, sharp. Worse with walking and standing - on feet a lot at work. Hard to bend and extend the knee. Tried icing, knee sleeve/brace. No skin changes, numbness.  Past Medical History:  Diagnosis Date  . ADD (attention deficit disorder with hyperactivity)   . Arthritis   . Cervical dysplasia   . GERD (gastroesophageal reflux disease)   . Mitral valve prolapse   . Multiple sclerosis ([place])   . Multiple sclerosis ([place])   . Tendonitis in rigtht wrist     Current Outpatient Medications on File Prior to Visit  Medication Sig Dispense Refill  . FLUoxetine (PROZAC) 20 MG capsule Take 3 capsules daily 270 capsule 3  . lisdexamfetamine (VYVANSE) 60 MG capsule Take 1 capsule (60 mg total) by mouth every morning. 90 capsule 0  . Ocrelizumab (OCREVUS IV) Ocrevus    . ocrelizumab 600 mg in sodium chloride 0.9 % 500 mL Inject 600 mg into the vein every 6 (six) months.    . pantoprazole (PROTONIX) 40 MG tablet Take 40 mg by mouth daily.     No current facility-administered medications on file prior to visit.     Past Surgical History:  Procedure Laterality Date  . ABDOMINAL HYSTERECTOMY  2004   ovaries retained  . BACK SURGERY    . TUBAL LIGATION      Allergies  Allergen Reactions  . Methylprednisolone Hives  . Solu-Medrol [Methylprednisolone Acetate] Hives and Itching  . Cephalexin Hives and Itching  . Cephalexin Itching and Hives    Social History   Socioeconomic History  . Marital status: Divorced    Spouse name: Not on file  . Number of children: Not on file  . Years of education: Not on file  . Highest education level: Not on file  Occupational History  . Not on file  Social Needs  . Financial resource strain: Not on file  . Food  insecurity:    Worry: Not on file    Inability: Not on file  . Transportation needs:    Medical: Not on file    Non-medical: Not on file  Tobacco Use  . Smoking status: Current Some Day Smoker    Packs/day: 0.25    Types: Cigarettes  . Smokeless tobacco: Never Used  Substance and Sexual Activity  . Alcohol use: No    Alcohol/week: 0.0 oz    Comment: Occasioanl glass of wine   . Drug use: No  . Sexual activity: Not Currently  Lifestyle  . Physical activity:    Days per week: Not on file    Minutes per session: Not on file  . Stress: Not on file  Relationships  . Social connections:    Talks on phone: Not on file    Gets together: Not on file    Attends religious service: Not on file    Active member of club or organization: Not on file    Attends meetings of clubs or organizations: Not on file    Relationship status: Not on file  . Intimate partner violence:    Fear of current or ex partner: Not on file    Emotionally abused: Not on file    Physically abused: Not on file    Forced sexual  activity: Not on file  Other Topics Concern  . Not on file  Social History Narrative  . Not on file    Family History  Problem Relation Age of Onset  . Depression Mother   . Stroke Mother   . Heart disease Mother   . Dementia Mother   . Cancer Neg Hx   . Diabetes Neg Hx     BP 126/82   Ht 5' 7.5" (1.715 m)   Wt 180 lb (81.6 kg)   BMI 27.78 kg/m   Review of Systems: See HPI above.     Objective:  Physical Exam:  Gen: NAD, comfortable in exam room  Left knee: Mod effusion.  No other gross deformity, ecchymoses. TTP popliteal fossa.  No other tenderness. FROM but very painful trying to extend fully, flex past 90 degrees. Negative ant/post drawers. Negative valgus/varus testing. Negative lachmanns. Negative mcmurrays, apleys, patellar apprehension. NV intact distally.   MSK u/s left knee:  Mod effusion confirmed.  Venous structures compressible posteriorly including  popliteal vein.  No bakers cyst.   Assessment & Plan:  1. Left knee pain - confirmed effusion with ultrasound.  No injury - possible she has a concurrent meniscus tear but exam reassuring.  Voltaren twice a day, norco as needed.  Compression sleeve or ace wrap.  Out of work for next few days.  Cortisone injection given today.  F/u in 1 week.  After informed written consent timeout was performed, patient was lying supine on exam table. Left knee was prepped with alcohol swab and utilizing superolateral approach with ultrasound guidance, patient's left knee was injected intraarticularly with 3:1 bupivicaine: depomedrol. Patient tolerated the procedure well without immediate complications.

## 2017-10-20 NOTE — Assessment & Plan Note (Signed)
confirmed effusion with ultrasound.  No injury - possible she has a concurrent meniscus tear but exam reassuring.  Voltaren twice a day, norco as needed.  Compression sleeve or ace wrap.  Out of work for next few days.  Cortisone injection given today.  F/u in 1 week.  After informed written consent timeout was performed, patient was lying supine on exam table. Left knee was prepped with alcohol swab and utilizing superolateral approach with ultrasound guidance, patient's left knee was injected intraarticularly with 3:1 bupivicaine: depomedrol. Patient tolerated the procedure well without immediate complications.

## 2017-10-22 ENCOUNTER — Ambulatory Visit: Payer: Self-pay | Admitting: Sports Medicine

## 2017-10-23 ENCOUNTER — Ambulatory Visit: Payer: 59 | Admitting: Family Medicine

## 2017-10-23 ENCOUNTER — Encounter: Payer: Self-pay | Admitting: Family Medicine

## 2017-10-23 VITALS — BP 133/76 | Ht 67.5 in | Wt 180.0 lb

## 2017-10-23 DIAGNOSIS — M25562 Pain in left knee: Secondary | ICD-10-CM

## 2017-10-23 DIAGNOSIS — M25462 Effusion, left knee: Secondary | ICD-10-CM | POA: Diagnosis not present

## 2017-10-23 NOTE — Progress Notes (Signed)
HPI  CC: Left knee follow-up Patient is here to follow-up regarding her left knee pain.  She was last seen last week by Dr. Barbaraann Barthel for the same issue.  At that time she received an intra-articular knee injection.  She states that she has had persistent discomfort and no significant improvement from this injection.  She endorses continued knee effusion which may have even worsened since last being seen.  No new injury, trauma, or event which may worsen this.  Otherwise no new symptoms.  She denies any new weakness, numbness, or paresthesias.  No new mechanical symptoms.  Medications/Interventions Tried: Intra-articular steroid injection  See HPI and/or previous note for associated ROS.  Objective: BP 133/76   Ht 5' 7.5" (1.715 m)   Wt 180 lb (81.6 kg)   BMI 27.78 kg/m  Gen: NAD, well groomed, a/o x3, normal affect.  CV: Well-perfused. Warm.  Resp: Non-labored.  Neuro: Sensation intact throughout. No gross coordination deficits.  Gait: Nonpathologic posture, unremarkable stride without signs of limp or balance issues. Knee, Left: TTP noted at the posterior lateral aspect of the knee and the lateral joint line with radiation down to the proximal fibular head.  Some posterior lateral swelling noted which appears to be part of the insertional biceps femoris tendon--this is a site for tenderness and is significantly more prominent than the contralateral knee. Inspection was negative for erythema, or ecchymosis. Moderate effusion. No obvious bony abnormalities or signs of osteophyte development. Palpation yielded no asymmetric warmth; No condyle tenderness; No patellar tenderness; No patellar crepitus. Patellar and quadriceps tendons unremarkable, and no tenderness of the pes anserine bursa. No obvious Baker's cyst development. ROM normal in flexion (135 degrees) and extension (0 degrees). Normal hamstring and quadriceps strength. Neurovascularly intact bilaterally.  - Ligaments: (Solid and  consistent endpoints)   - ACL (present bilaterally)   - PCL (present bilaterally)   - LCL (present bilaterally)   - MCL (present bilaterally).   - Meniscus:   - McMurray's: Positive  ULTRASOUND: Knee, Left Diagnostic limited ultrasound imaging obtained of patient's Left knee.  - Quadriceps tendon: No appreciated signs of tearing, edema, or calcification.  - Suprapatellar pouch: Moderate (compressible) fluid/edema noted within the suprapatellar pouch.  - Patellar tendon: No appreciated signs of tearing, edema, or calcification. No infrapatellar or tibial tuberosity fluid or abnormality appreciated.  - Medial joint line: No signs concerning for meniscal pathology appreciated. Mildly increased fluid presence noted. Mild evidence of osteophyte development or significant joint space loss.  - Lateral joint line: signs concerning for meniscal pathology appreciated with very small longitudinal split. No increased fluid presence noted. No evidence of osteophyte development or significant joint space loss.  - Popliteal fossa: Very Small Baker's cyst formation. Vasculature unremarkable. - MCL: No evidence of integrity loss or abnormal fluid presence.  - LCL: No evidence of integrity loss or abnormal fluid presence.  - Pes Anserine: No evidence of integrity loss or abnormal fluid presence.  IMPRESSION: findings consistent with moderate knee effusion.  Small Baker's cyst.  Possible lateral meniscal tear noted.   Assessment and plan:  Effusion, left knee Patient is here with persistent left-sided knee effusion.  Ultrasound yielded relatively mild symptoms with signs of a small developing Baker's cyst a possible lateral meniscus tear.  A moderate amount of fluid presence was noted within the suprapatellar pouch.  Knee exam was relatively benign with the exception of some fullness over the biceps femoris insertion which may be cause for some of her discomfort but  would not explain her effusion. -MRI  ordered today -Encouraged regular elevation, ice and compression -I will contact patient with results from her MRI and we will move forward with plans at that point.   Orders Placed This Encounter  Procedures  . MR Knee Left  Wo Contrast    Epic order Wt 175 / ht 5'7 / no claus / no hx of L knee sx / no hx of brain, eyes, ears sx / cardiac ablation - 15 years ago - cone / no metal removed from eyes / no needs Ins - umr bl/pt    Standing Status:   Future    Standing Expiration Date:   12/24/2018    Order Specific Question:   What is the patient's sedation requirement?    Answer:   No Sedation    Order Specific Question:   Does the patient have a pacemaker or implanted devices?    Answer:   No    Order Specific Question:   Preferred imaging location?    Answer:   [facility] (table limit-550lbs)    Order Specific Question:   Radiology Contrast Protocol - do NOT remove file path    Answer:   \\[email or web address]    Order Specific Question:   Reason for Exam additional comments    Answer:   knee effusion; rule out menisus tear    Elberta Leatherwood, MD,MS [facility] 10/23/2017 5:38 PM

## 2017-10-23 NOTE — Assessment & Plan Note (Signed)
Patient is here with persistent left-sided knee effusion.  Ultrasound yielded relatively mild symptoms with signs of a small developing Baker's cyst a possible lateral meniscus tear.  A moderate amount of fluid presence was noted within the suprapatellar pouch.  Knee exam was relatively benign with the exception of some fullness over the biceps femoris insertion which may be cause for some of her discomfort but would not explain her effusion. -MRI ordered today -Encouraged regular elevation, ice and compression -I will contact patient with results from her MRI and we will move forward with plans at that point.

## 2017-10-26 ENCOUNTER — Ambulatory Visit
Admission: RE | Admit: 2017-10-26 | Discharge: 2017-10-26 | Disposition: A | Discharge status: Home / Self Care | Payer: 59 | Source: Ambulatory Visit | Attending: Family Medicine | Admitting: Family Medicine

## 2017-10-26 DIAGNOSIS — M1712 Unilateral primary osteoarthritis, left knee: Secondary | ICD-10-CM | POA: Diagnosis not present

## 2017-10-26 DIAGNOSIS — M25562 Pain in left knee: Secondary | ICD-10-CM

## 2017-10-29 ENCOUNTER — Other Ambulatory Visit: Payer: Self-pay

## 2017-10-29 ENCOUNTER — Encounter: Payer: Self-pay | Admitting: *Deleted

## 2017-10-29 ENCOUNTER — Encounter: Payer: Self-pay | Admitting: Neurology

## 2017-10-29 ENCOUNTER — Telehealth: Payer: Self-pay | Admitting: Family Medicine

## 2017-10-29 ENCOUNTER — Ambulatory Visit: Payer: 59 | Admitting: Neurology

## 2017-10-29 VITALS — BP 116/74 | HR 87 | Resp 18 | Ht 67.5 in | Wt 190.5 lb

## 2017-10-29 DIAGNOSIS — R29898 Other symptoms and signs involving the musculoskeletal system: Secondary | ICD-10-CM | POA: Diagnosis not present

## 2017-10-29 DIAGNOSIS — R260 Ataxic gait: Secondary | ICD-10-CM | POA: Diagnosis not present

## 2017-10-29 DIAGNOSIS — R5383 Other fatigue: Secondary | ICD-10-CM | POA: Diagnosis not present

## 2017-10-29 DIAGNOSIS — G35 Multiple sclerosis: Secondary | ICD-10-CM | POA: Diagnosis not present

## 2017-10-29 DIAGNOSIS — Z79899 Other long term (current) drug therapy: Secondary | ICD-10-CM

## 2017-10-29 DIAGNOSIS — R350 Frequency of micturition: Secondary | ICD-10-CM

## 2017-10-29 MED ORDER — MIRABEGRON ER 50 MG PO TB24: 50.0000 mg | ORAL_TABLET | Freq: Every day | ORAL | 4 refills | Status: DC

## 2017-10-29 MED ORDER — OXYBUTYNIN CHLORIDE ER 10 MG PO TB24: 10.0000 mg | ORAL_TABLET | Freq: Every day | ORAL | 3 refills | Status: DC

## 2017-10-29 MED FILL — OXYBUTYNIN CL ER 10 MG TAB: 10 | 90 days supply | Qty: 90 | Fill #0

## 2017-10-29 NOTE — Telephone Encounter (Signed)
Phone Call:  Called patient w/ MRI results. Pt continues to have significant discomfort in knee. Now mostly medially (had previously been posterior and laterally). I discussed the MRI results with her. No new injury or setbacks.  Plan: - Off-load knee >> crutches for ambulation until next visit - Compression >> OTC sleeve/ACE wrap - Ice >> 58min on/off, as often as tolerated - F/u >> Friday w/ me. Will write work note for patient until Friday if necessary.

## 2017-10-29 NOTE — Progress Notes (Signed)
[facility]  PATIENT: [name] DOB: 1960-09-01     HISTORICAL  CHIEF COMPLAINT:  Chief Complaint  Patient presents with  . Multiple Sclerosis    Last Ocrevus infusion was 09/19/17.  Sts. she feels she is having more inflammatory processes--had tendonitis left elbow, now having pain/swelling behind left knee onset 3-4 wks. ago.  Has seen sports med, has had left le doppler to r/o dvt, and had MRI left knee on Satur[place]--waiting on results/fim    HISTORY OF PRESENT ILLNESS:  [name] is a 57 year old woman with multiple sclerosis.   Update 10/29/2017: She feels her MS has been stable but she is having more inflammatory issues.   She had her second Ocrevus infusion April 2019 and started October 2018.    She had a possible allergic reaction with the last Ocrevus (itchy, scratchy throat, helped by Benadryl, back to baseline the next day).   She felt the right leg weakness improved but numbness persisted after the last exacerbation.     She has urinary urgency and incontinence.     She is noting more fatigue in the last year.  Of note, she is on Vyvanse 60 mg daily.  She feels she gets a benefit for about 12 hours but when it starts to wear off she quickly becomes more tired.  She is waking up 2-3 times a night due to nocturia and sometimes has trouble falling back asleep.  She had left elbow bursitis with swelling.    She has had fluid around the left knee (MRI results below)  IMPRESSION: 1. Prominent degeneration of the posterior horn of the medial meniscus and mild peripheral extrusion of the meniscus from the joint. No discrete meniscal tear demonstrated. 2. 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 Associated moderate patellofemoral degenerative changes. 3. Milder medial compartment degenerative changes with reactive edema centrally in the proximal tibia. 4.  Moderate-sized joint effusion with evidence of synovitis. 5. Intact lateral meniscus, cruciate and collateral ligaments.    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 She was stable on brand-name Copaxone the insurance company made her go to generic this year.  Yesterday, she had the onset of right leg weakness and is even weaker this morning. She went to work but is having difficulty and called our office concerned about an exacerbation. The weakness is only in the right leg. Specifically she does not note any right arm or left leg weakness. There is no new weakness in the face. There is no numbness in the limbs or face.   She is having trouble walking due to the weakness. Her is a significant right foot drop.  While getting IV Solu-Medrol, she developed a rash and a dry cough she did not report any shortness of breath and vital signs remained stable. She transiently improved with 25 mg IV Benadryl and improved further with an additional 25 mg of Benadryl and IV Pepcid.    ROS:  Out of a complete 14 system review of symptoms, the patient complains only of the following symptoms, and all other reviewed systems are negative.  She has some fatigue.   She has ADD.  Mild GERD. Mild depression improved on fluoxetine   ALLERGIES: Allergies  Allergen Reactions  . Methylprednisolone Hives  . Solu-Medrol [Methylprednisolone Acetate] Hives and Itching  . Cephalexin Hives and Itching  . Cephalexin  Itching and Hives    HOME MEDICATIONS:  Current Outpatient Medications:  .  diclofenac (VOLTAREN) 75 MG EC tablet, Take 1 tablet (75 mg total) by mouth 2 (two) times daily., Disp: 60 tablet, Rfl: 1 .  FLUoxetine (PROZAC) 20 MG capsule, Take 3 capsules daily, Disp: 270 capsule, Rfl: 3 .  HYDROcodone-acetaminophen (NORCO) 5-325 MG tablet, Take 1 tablet by mouth every 6 (six) hours as needed for moderate pain., Disp: 20  tablet, Rfl: 0 .  lisdexamfetamine (VYVANSE) 60 MG capsule, Take 1 capsule (60 mg total) by mouth every morning., Disp: 90 capsule, Rfl: 0 .  Ocrelizumab (OCREVUS IV), Ocrevus, Disp: , Rfl:  .  ocrelizumab 600 mg in sodium chloride 0.9 % 500 mL, Inject 600 mg into the vein every 6 (six) months., Disp: , Rfl:  .  pantoprazole (PROTONIX) 40 MG tablet, Take 40 mg by mouth daily., Disp: , Rfl:  .  mirabegron ER (MYRBETRIQ) 50 MG TB24 tablet, Take 1 tablet (50 mg total) by mouth daily., Disp: 90 tablet, Rfl: 4 .  oxybutynin (DITROPAN XL) 10 MG 24 hr tablet, Take 1 tablet (10 mg total) by mouth at bedtime., Disp: 90 tablet, Rfl: 3  PAST MEDICAL HISTORY: Past Medical History:  Diagnosis Date  . ADD (attention deficit disorder with hyperactivity)   . Arthritis   . Cervical dysplasia   . GERD (gastroesophageal reflux disease)   . Mitral valve prolapse   . Multiple sclerosis ([place])   . Multiple sclerosis ([place])   . Tendonitis in rigtht wrist     PAST SURGICAL HISTORY: Past Surgical History:  Procedure Laterality Date  . ABDOMINAL HYSTERECTOMY  2004   ovaries retained  . BACK SURGERY    . TUBAL LIGATION      FAMILY HISTORY: Family History  Problem Relation Age of Onset  . Depression Mother   . Stroke Mother   . Heart disease Mother   . Dementia Mother   . Cancer Neg Hx   . Diabetes Neg Hx     SOCIAL HISTORY:  Social History   Socioeconomic History  . Marital status: Divorced    Spouse name: Not on file  . Number of children: Not on file  . Years of education: Not on file  . Highest education level: Not on file  Occupational History  . Not on file  Social Needs  . Financial resource strain: Not on file  . Food insecurity:    Worry: Not on file    Inability: Not on file  . Transportation needs:    Medical: Not on file    Non-medical: Not on file  Tobacco Use  . Smoking status: Current Some Day Smoker    Packs/day: 0.25    Types: Cigarettes  . Smokeless tobacco: Never  Used  Substance and Sexual Activity  . Alcohol use: No    Alcohol/week: 0.0 oz    Comment: Occasioanl glass of wine   . Drug use: No  . Sexual activity: Not Currently  Lifestyle  . Physical activity:    Days per week: Not on file    Minutes per session: Not on file  . Stress: Not on file  Relationships  . Social connections:    Talks on phone: Not on file    Gets together: Not on file    Attends religious service: Not on file    Active member of club or organization: Not on file    Attends meetings of clubs or organizations: Not on file  Relationship status: Not on file  . Intimate partner violence:    Fear of current or ex partner: Not on file    Emotionally abused: Not on file    Physically abused: Not on file    Forced sexual activity: Not on file  Other Topics Concern  . Not on file  Social History Narrative  . Not on file     PHYSICAL EXAM   BP 116/74   Pulse 87   Resp 18   Ht 5' 7.5" (1.715 m)   Wt 190 lb 8 oz (86.4 kg)   BMI 29.40 kg/m    General: The patient is well-developed and well-nourished and in no acute distress  INeurologic Exam  Mental status: The patient is alert and oriented x 3 at the time of the examination. The patient has apparent normal recent and remote memory, with an apparently normal attention span and concentration ability.   Speech is normal.  Cranial nerves: Extraocular movements are full.  Facial strength and sensation is normal.  Trapezius strength is normal.  The tongue is midline, and the patient has symmetric elevation of the soft palate. No obvious hearing deficits are noted.  Motor:  Muscle bulk is normal.   Tone is slightly increased in legs. The strength is 2+/5 in the right leg. 5/5 elsewhere..   Sensory: Sensory testing is intact to touch 4 .  Coordination: Cerebellar testing reveals good finger-nose-finger .  Heel-to-shin is reduced in the right leg.  Gait and station: Station is normal.   Gait shows a slight right  foot drop.  She also favors her painful left knee.  She has difficulty doing a tandem walk. Reflexes: Deep tendon reflexes are symmetric and normal bilaterally.     DIAGNOSTIC DATA (LABS, IMAGING, TESTING) - I reviewed patient records, labs, notes, testing and imaging myself where available.  Lab Results  Component Value Date   WBC 8.8 01/10/2017   HGB 12.6 01/10/2017   HCT 38.8 01/10/2017   MCV 88 01/10/2017   PLT 361 01/10/2017      Component Value Date/Time   NA 141 08/15/2015 1616   K 4.7 08/15/2015 1616   CL 101 08/15/2015 1616   CO2 26 08/15/2015 1616   GLUCOSE 90 08/15/2015 1616   BUN 19 08/15/2015 1616   CREATININE 0.54 (L) 08/15/2015 1616   CALCIUM 9.2 08/15/2015 1616   PROT 6.5 01/10/2017 1558   ALBUMIN 4.0 01/10/2017 1558   AST 17 01/10/2017 1558   ALT 18 01/10/2017 1558   ALKPHOS 94 01/10/2017 1558   BILITOT <0.2 01/10/2017 1558   GFRNONAA 107 08/15/2015 1616   GFRAA 124 08/15/2015 1616       ASSESSMENT AND PLAN  MS (multiple sclerosis) (HCC) - Plan: CBC with Differential/Platelet, TSH, IgG, IgA, IgM  Right leg weakness  Ataxic gait  Urinary frequency  High risk medication use - Plan: CBC with Differential/Platelet, TSH, IgG, IgA, IgM  Other fatigue - Plan: CBC with Differential/Platelet, TSH  1.   Continue ocrelizumab.  Check blood work.   2.   Oxybutynin and Myrbetriq for urinary urge incontinence.    If nocturia does not improve, consider desmopressin.  Also consider referral to urology if symptoms do not improve for consideration of Botox 3.    She will return to see [place] in 4 to 6 months or sooner if any new or worsening neurologic symptoms.   Kayliah Tindol A. Felecia Shelling, MD, PhD, Charlynn Grimes 9/56/2130, 8:65 PM Certified in Neurology, Clinical Neurophysiology, Sleep Medicine,  Pain Medicine and Neuroimaging  [facility] [address] [place], [place] [address] [phone]

## 2017-10-30 ENCOUNTER — Telehealth: Payer: Self-pay | Admitting: *Deleted

## 2017-10-30 LAB — CBC WITH DIFFERENTIAL/PLATELET
Basophils Absolute: 0.1 10*3/uL (ref 0.0–0.2)
Basos: 1 %
EOS (ABSOLUTE): 0.4 10*3/uL (ref 0.0–0.4)
Eos: 6 %
Hematocrit: 38.1 % (ref 34.0–46.6)
Hemoglobin: 12.6 g/dL (ref 11.1–15.9)
Immature Grans (Abs): 0 10*3/uL (ref 0.0–0.1)
Immature Granulocytes: 0 %
Lymphocytes Absolute: 2.4 10*3/uL (ref 0.7–3.1)
Lymphs: 34 %
MCH: 28.4 pg (ref 26.6–33.0)
MCHC: 33.1 g/dL (ref 31.5–35.7)
MCV: 86 fL (ref 79–97)
Monocytes Absolute: 0.8 10*3/uL (ref 0.1–0.9)
Monocytes: 11 %
Neutrophils Absolute: 3.4 10*3/uL (ref 1.4–7.0)
Neutrophils: 48 %
Platelets: 298 10*3/uL (ref 150–450)
RBC: 4.44 x10E6/uL (ref 3.77–5.28)
RDW: 14.5 % (ref 12.3–15.4)
WBC: 7 10*3/uL (ref 3.4–10.8)

## 2017-10-30 LAB — TSH: TSH: 1.22 u[IU]/mL (ref 0.450–4.500)

## 2017-10-30 LAB — IGG, IGA, IGM
IgA/Immunoglobulin A, Serum: 170 mg/dL (ref 87–352)
IgG (Immunoglobin G), Serum: 621 mg/dL — ABNORMAL LOW (ref 700–1600)
IgM (Immunoglobulin M), Srm: 64 mg/dL (ref 26–217)

## 2017-10-30 NOTE — Telephone Encounter (Signed)
-----   Message from Britt Bottom, MD sent at 10/30/2017  1:03 PM EDT ----- Please let the patient know that the lab work is fine.

## 2017-10-30 NOTE — Telephone Encounter (Signed)
LMOM with below lab results.  She does not need to return this call unless she has questions/fim 

## 2017-10-31 ENCOUNTER — Ambulatory Visit (INDEPENDENT_AMBULATORY_CARE_PROVIDER_SITE_OTHER): Payer: 59 | Admitting: Psychiatry

## 2017-10-31 ENCOUNTER — Encounter (HOSPITAL_COMMUNITY): Payer: Self-pay | Admitting: Psychiatry

## 2017-10-31 DIAGNOSIS — Z818 Family history of other mental and behavioral disorders: Secondary | ICD-10-CM | POA: Diagnosis not present

## 2017-10-31 DIAGNOSIS — F900 Attention-deficit hyperactivity disorder, predominantly inattentive type: Secondary | ICD-10-CM

## 2017-10-31 DIAGNOSIS — Z79899 Other long term (current) drug therapy: Secondary | ICD-10-CM | POA: Diagnosis not present

## 2017-10-31 DIAGNOSIS — F17210 Nicotine dependence, cigarettes, uncomplicated: Secondary | ICD-10-CM | POA: Diagnosis not present

## 2017-10-31 DIAGNOSIS — F339 Major depressive disorder, recurrent, unspecified: Secondary | ICD-10-CM

## 2017-10-31 MED ORDER — LISDEXAMFETAMINE DIMESYLATE 60 MG PO CAPS: 60.0000 mg | ORAL_CAPSULE | ORAL | 0 refills | Status: DC

## 2017-10-31 NOTE — Progress Notes (Signed)
[facility] MD/PA/NP OP Progress Note  10/31/2017 4:38 PM [name]  MRN:  [id number]  Chief Complaint: I have a lot of pain in my knee.  HPI: Patient came for her follow-up appointment.  Recently she is complaining of knee pain.  She had MRI and she is seeing Dr. and taking anti-inflammatory but her knee pain is not getting better.  She is compliant with Prozac but is given by Dr. Felecia Shelling and Vyvanse which is helping her attention and focus.  She admitted there are days when she has difficulty sleeping because she has to pee multiple times in the middle of the night.  Her job is going well.  She denies any irritability, anger, mania, psychosis or any hallucination.  Her multiple sclerosis is a stable.  Patient denies any hallucination, paranoia, suicidal thoughts or homicidal thought.  Her appetite is okay.  Her vital signs are stable.  Visit Diagnosis:    ICD-10-CM   1. Attention deficit hyperactivity disorder (ADHD), predominantly inattentive type F90.0 lisdexamfetamine (VYVANSE) 60 MG capsule    Past Psychiatric History: Reviewed. Patient has history of depression started when she was in the process of divorce. She was given Zoloft and then Prozac. She also given Focalin, Ritalin and Strattera. Patient denies any history of suicidal attempt or any psychosis  Past Medical History:  Past Medical History:  Diagnosis Date  . ADD (attention deficit disorder with hyperactivity)   . Arthritis   . Cervical dysplasia   . GERD (gastroesophageal reflux disease)   . Mitral valve prolapse   . Multiple sclerosis ([place])   . Multiple sclerosis ([place])   . Tendonitis in rigtht wrist     Past Surgical History:  Procedure Laterality Date  . ABDOMINAL HYSTERECTOMY  2004   ovaries retained  . BACK SURGERY    . TUBAL LIGATION      Family Psychiatric History: Reviewed.  Family History:  Family History  Problem Relation Age of Onset  . Depression Mother   . Stroke Mother   . Heart disease Mother    . Dementia Mother   . Cancer Neg Hx   . Diabetes Neg Hx     Social History:  Social History   Socioeconomic History  . Marital status: Divorced    Spouse name: Not on file  . Number of children: Not on file  . Years of education: Not on file  . Highest education level: Not on file  Occupational History  . Not on file  Social Needs  . Financial resource strain: Not on file  . Food insecurity:    Worry: Not on file    Inability: Not on file  . Transportation needs:    Medical: Not on file    Non-medical: Not on file  Tobacco Use  . Smoking status: Current Some Day Smoker    Packs/day: 0.25    Types: Cigarettes  . Smokeless tobacco: Never Used  Substance and Sexual Activity  . Alcohol use: No    Alcohol/week: 0.0 oz    Comment: Occasioanl glass of wine   . Drug use: No  . Sexual activity: Not Currently  Lifestyle  . Physical activity:    Days per week: Not on file    Minutes per session: Not on file  . Stress: Not on file  Relationships  . Social connections:    Talks on phone: Not on file    Gets together: Not on file    Attends religious service: Not on file  Active member of club or organization: Not on file    Attends meetings of clubs or organizations: Not on file    Relationship status: Not on file  Other Topics Concern  . Not on file  Social History Narrative  . Not on file    Allergies:  Allergies  Allergen Reactions  . Methylprednisolone Hives  . Solu-Medrol [Methylprednisolone Acetate] Hives and Itching  . Cephalexin Hives and Itching  . Cephalexin Itching and Hives    Metabolic Disorder Labs: No results found for: HGBA1C, MPG No results found for: PROLACTIN No results found for: CHOL, TRIG, HDL, CHOLHDL, VLDL, LDLCALC Lab Results  Component Value Date   TSH 1.220 10/29/2017    Therapeutic Level Labs: No results found for: LITHIUM No results found for: VALPROATE No components found for:  CBMZ  Current Medications: Current  Outpatient Medications  Medication Sig Dispense Refill  . diclofenac (VOLTAREN) 75 MG EC tablet Take 1 tablet (75 mg total) by mouth 2 (two) times daily. 60 tablet 1  . FLUoxetine (PROZAC) 20 MG capsule Take 3 capsules daily 270 capsule 3  . HYDROcodone-acetaminophen (NORCO) 5-325 MG tablet Take 1 tablet by mouth every 6 (six) hours as needed for moderate pain. 20 tablet 0  . lisdexamfetamine (VYVANSE) 60 MG capsule Take 1 capsule (60 mg total) by mouth every morning. 90 capsule 0  . mirabegron ER (MYRBETRIQ) 50 MG TB24 tablet Take 1 tablet (50 mg total) by mouth daily. 90 tablet 4  . Ocrelizumab (OCREVUS IV) Ocrevus    . ocrelizumab 600 mg in sodium chloride 0.9 % 500 mL Inject 600 mg into the vein every 6 (six) months.    [name] oxybutynin (DITROPAN XL) 10 MG 24 hr tablet Take 1 tablet (10 mg total) by mouth at bedtime. 90 tablet 3  . pantoprazole (PROTONIX) 40 MG tablet Take 40 mg by mouth daily.     No current facility-administered medications for this visit.      Musculoskeletal: Strength & Muscle Tone: decreased Gait & Station: unsteady Patient leans: N/A  Psychiatric Specialty Exam: ROS  Blood pressure 122/70, pulse 68, height 5' 7.5" (1.715 m), weight 191 lb 3.2 oz (86.7 kg).Body mass index is 29.5 kg/m.  General Appearance: Casual  Eye Contact:  Good  Speech:  Clear and Coherent  Volume:  Normal  Mood:  Anxious  Affect:  Congruent  Thought Process:  Goal Directed  Orientation:  Full (Time, Place, and Person)  Thought Content: Logical   Suicidal Thoughts:  No  Homicidal Thoughts:  No  Memory:  Immediate;   Good Recent;   Good Remote;   Good  Judgement:  Good  Insight:  Good  Psychomotor Activity:  Normal  Concentration:  Concentration: Good and Attention Span: Good  Recall:  Good  Fund of Knowledge: Good  Language: Good  Akathisia:  No  Handed:  Right  AIMS (if indicated): not done  Assets:  Communication Skills Desire for Improvement Housing Resilience Social  Support  ADL's:  Intact  Cognition: WNL  Sleep:  Good   Screenings: PHQ2-9     Office Visit from 03/13/2016 in [facility]  PHQ-2 Total Score  0       Assessment and Plan: Attention deficit disorder, inattentive type.  Major depressive disorder, recurrent.  Patient is doing better on Vyvanse.  She has no shakes, tremors or shakes.  She is able to do multitasking.  Continue Vyvanse 60 mg daily.  She is getting Prozac 60 mg  from Dr. Felecia Shelling.  She has not taken Xanax in a while.  Recommended to call us back if she has any question or any concern.  Patient is not interested in counseling.  Follow-up in 3 months.   Kathlee Nations, MD 10/31/2017, 4:38 PM

## 2017-11-01 ENCOUNTER — Encounter: Payer: Self-pay | Admitting: Family Medicine

## 2017-11-01 ENCOUNTER — Ambulatory Visit: Payer: 59 | Admitting: Family Medicine

## 2017-11-01 VITALS — BP 131/81 | Ht 67.5 in | Wt 180.0 lb

## 2017-11-01 DIAGNOSIS — M25562 Pain in left knee: Secondary | ICD-10-CM | POA: Diagnosis not present

## 2017-11-01 MED ORDER — RANITIDINE HCL 150 MG PO CAPS: 150.0000 mg | ORAL_CAPSULE | Freq: Every day | ORAL | 0 refills | Status: DC

## 2017-11-01 MED FILL — raNITIdine HCL 150 MG TABS: 150 | 14 days supply | Qty: 14 | Fill #0

## 2017-11-01 NOTE — Progress Notes (Addendum)
HPI  CC: Follow-up left knee pain Patient is here to follow-up regarding her left-sided knee pain.  Since last being seen she has had slightly worsening symptoms.  MRI showed synovitis without dramatic intra-articular injury/pathology.  Patient denies any new or recent injury to have caused the worsening pain.  She has been relatively nonweightbearing with use of crutches over the past few days without significant benefit.  Swelling persists.  She denies any fevers, chills, nausea, vomiting, or diarrhea.  No weakness, numbness, or paresthesias.  Knee feels tight achy and pain is worse with weightbearing and knee flexion.  No feelings of instability.  Medications/Interventions Tried: Intra-articular steroid injection, nonweightbearing, relative rest.  See HPI and/or previous note for associated ROS.  Objective: BP 131/81   Ht 5' 7.5" (1.715 m)   Wt 180 lb (81.6 kg)   BMI 27.78 kg/m  Gen: NAD, well groomed, a/o x3, normal affect.  CV: Well-perfused. Warm.  Resp: Non-labored.  Neuro: Sensation intact throughout. No gross coordination deficits.  Gait: Nonpathologic posture, unremarkable stride without signs of limp or balance issues. Knee, Left: TTP noted at the medial/lateral joint line and inferior pole of the patella with extension posteriorly. Inspection was negative for erythema, or ecchymosis. Moderate effusion.  No warmth.  No obvious bony abnormalities or signs of osteophyte development. No condyle tenderness; No patellar crepitus. Patellar and quadriceps tendons unremarkable, and no tenderness of the pes anserine bursa. No obvious Baker's cyst development. ROM full. Normal hamstring and quadriceps strength. Neurovascularly intact bilaterally.             - Ligaments: (Solid and consistent endpoints)                         - ACL (present bilaterally)                         - PCL (present bilaterally)                         - LCL (present bilaterally)                         - MCL  (present bilaterally)  JOINT ASPIRATION: Left knee Patient was given informed consent, signed copy in the chart. Appropriate time out was taken. Area prepped and draped in usual sterile fashion. 3 cc lidocaine without epinephrine was used to anesthetize the soft tissue around the lateral suprapatellar pouch. An 18-gauge needle was then inserted into the suprapatellar pouch and 35 cc of benign-appearing synovial fluid was extracted from the joint using a 60 cc syringe.  After discussion with patient the decision to not move forward with steroid injection was made due to the possibility of prior allergic response with previous injection.  The patient tolerated the procedure well. There were no complications. Post procedure instructions were given.    Assessment and plan:  Left knee pain Patient is here again for persistent acute left knee pain and effusion.  I have some concerns that patient may have a drug allergy to the Depo-Medrol provided with her initial knee injection.  This would explain some of the acute worsening she had.  Patient is at theoretical risk due to her allergic response to Solu-Medrol. -Aspiration without injection provided today.  Patient tolerated well. -Ranitidine once daily x14 days provided -Benadryl OTC once at night x3 days. -Regular knee compression to help  prevent reaccumulation of fluid. -Ice regularly. -Work note provided today. -Synovial fluid sent for testing. -Patient is contact me in 1-2 weeks with update on her symptoms. -Follow-up in 2-4 weeks.  Next: If labs negative and pain persists patient may need referral to Ortho for possible synovectomy.   Orders Placed This Encounter  Procedures  . Body fluid culture    Standing Status:   Future    Number of Occurrences:   1    Standing Expiration Date:   11/02/2018  . Synovial fluid, cell count    Standing Status:   Future    Number of Occurrences:   1    Standing Expiration Date:   11/02/2018    Meds  ordered this encounter  Medications  . ranitidine (ZANTAC) 150 MG capsule    Sig: Take 1 capsule (150 mg total) by mouth daily.    Dispense:  14 capsule    Refill:  0     Elberta Leatherwood, MD,MS [facility] 11/01/2017 5:12 PM

## 2017-11-01 NOTE — Assessment & Plan Note (Addendum)
Patient is here again for persistent acute left knee pain and effusion.  I have some concerns that patient may have a drug allergy to the Depo-Medrol provided with her initial knee injection.  This would explain some of the acute worsening she had.  Patient is at theoretical risk due to her allergic response to Solu-Medrol. -Aspiration without injection provided today.  Patient tolerated well. -Ranitidine once daily x14 days provided -Benadryl OTC once at night x3 days. -Regular knee compression to help prevent reaccumulation of fluid. -Ice regularly. -Work note provided today. -Synovial fluid sent for testing. -Patient is contact me in 1-2 weeks with update on her symptoms. -Follow-up in 2-4 weeks.  Next: If labs negative and pain persists patient may need referral to Ortho for possible synovectomy.

## 2017-11-02 LAB — SYNOVIAL FLUID, CELL COUNT
Eos, Fluid: 0 %
Lining Cells, Synovial: 0 %
Lymphs, Fluid: 25 %
Macrophages Fld: 43 %
Nuc cell # Fld: 499 cells/uL — ABNORMAL HIGH (ref 0–200)
Polys, Fluid: 32 %
RBC, Fluid: 2000 /uL

## 2017-11-05 ENCOUNTER — Encounter (INDEPENDENT_AMBULATORY_CARE_PROVIDER_SITE_OTHER): Payer: Self-pay

## 2017-11-05 ENCOUNTER — Ambulatory Visit (INDEPENDENT_AMBULATORY_CARE_PROVIDER_SITE_OTHER): Payer: 59 | Admitting: Orthopaedic Surgery

## 2017-11-05 DIAGNOSIS — M659 Synovitis and tenosynovitis, unspecified: Secondary | ICD-10-CM | POA: Diagnosis not present

## 2017-11-05 DIAGNOSIS — M84469A Pathological fracture, unspecified tibia and fibula, initial encounter for fracture: Secondary | ICD-10-CM | POA: Insufficient documentation

## 2017-11-05 DIAGNOSIS — S83249A Other tear of medial meniscus, current injury, unspecified knee, initial encounter: Secondary | ICD-10-CM | POA: Insufficient documentation

## 2017-11-05 DIAGNOSIS — S83242A Other tear of medial meniscus, current injury, left knee, initial encounter: Secondary | ICD-10-CM | POA: Diagnosis not present

## 2017-11-05 DIAGNOSIS — M84362A Stress fracture, left tibia, initial encounter for fracture: Secondary | ICD-10-CM | POA: Diagnosis not present

## 2017-11-05 DIAGNOSIS — M65962 Unspecified synovitis and tenosynovitis, left lower leg: Secondary | ICD-10-CM | POA: Insufficient documentation

## 2017-11-05 LAB — BODY FLUID CULTURE

## 2017-11-05 MED ORDER — TRAMADOL HCL 50 MG PO TABS: 50.0000 mg | ORAL_TABLET | Freq: Three times a day (TID) | ORAL | 2 refills | Status: DC | PRN

## 2017-11-05 MED FILL — traMADol HCL 50 MG TABS: 50 | 5 days supply | Qty: 30 | Fill #0

## 2017-11-05 NOTE — Addendum Note (Signed)
Addended by: Azucena Cecil on: 11/05/2017 11:17 AM   Modules accepted: Orders

## 2017-11-05 NOTE — Progress Notes (Addendum)
Office Visit Note   Patient: [name]           Date of Birth: 06/17/1960           MRN: [id number] Visit Date: 11/05/2017              Requested by: No referring provider defined for this encounter. PCP: Patient, No Pcp Per   Assessment & Plan: Visit Diagnoses:  1. Acute medial meniscus tear of left knee, initial encounter   2. Synovitis of left knee   3. Stress fracture of left tibia, initial encounter     Plan: Impression is left knee medial meniscal tear and dysfunction with synovitis and insufficiency fracture of the proximal tibia.  MRI findings were reviewed with the patient.  She has failed conservative treatment.  We did discuss giving this more time with protected weightbearing with either crutches or a knee scooter but she feels that she cannot endure the pain any longer.  We discussed the surgery in detail including the risks and benefits and the expected post surgical rehab.  She understands and wishes to proceed.  We will get her scheduled in the near future.  Follow-Up Instructions: Return for 2 week postop visit.   Orders:  No orders of the defined types were placed in this encounter.  Meds ordered this encounter  Medications  . traMADol (ULTRAM) 50 MG tablet    Sig: Take 1-2 tablets (50-100 mg total) by mouth 3 (three) times daily as needed.    Dispense:  30 tablet    Refill:  2      Procedures: No procedures performed   Clinical Data: No additional findings.   Subjective: Chief Complaint  Patient presents with  . Left Knee - Pain    [name] is a very pleasant 57 year old female who I know well from working together in the past.  She states that she has had severe pain in the last 4 weeks that is worse with ambulation and weightbearing.  The pain overall has gotten worse also.  She has had an aspiration of her knee which yielded approximately 45 cc of fluid.  She also had a cortisone injection.  This helped for about 2 days.  She no longer has  posterior pain.  She does have significant and severe medial sided knee pain.  She has complaints of giving way.  She has difficulty with bending her knee back.  Denies any numbness and tingling.   Review of Systems  Constitutional: Negative.   HENT: Negative.   Eyes: Negative.   Respiratory: Negative.   Cardiovascular: Negative.   Endocrine: Negative.   Musculoskeletal: Negative.   Neurological: Negative.   Hematological: Negative.   Psychiatric/Behavioral: Negative.   All other systems reviewed and are negative.    Objective: Vital Signs: There were no vitals taken for this visit.  Physical Exam  Constitutional: She is oriented to person, place, and time. She appears well-developed and well-nourished.  HENT:  Head: Normocephalic and atraumatic.  Eyes: EOM are normal.  Neck: Neck supple.  Pulmonary/Chest: Effort normal.  Abdominal: Soft.  Neurological: She is alert and oriented to person, place, and time.  Skin: Skin is warm. Capillary refill takes less than 2 seconds.  Psychiatric: She has a normal mood and affect. Her behavior is normal. Judgment and thought content normal.  Nursing note and vitals reviewed.   Ortho Exam Left knee exam shows a moderate joint effusion.  She is very tender along the medial joint line.  Pes bursa is nontender.  Collaterals and cruciates are stable.  No posterior knee pain.  Pain with McMurray testing. Specialty Comments:  No specialty comments available.  Imaging: No results found.   PMFS History: Patient Active Problem List   Diagnosis Date Noted  . Acute medial meniscus tear 11/05/2017  . Synovitis of left knee 11/05/2017  . Stress fracture of left tibia 11/05/2017  . Effusion, left knee 10/23/2017  . Left knee pain 10/20/2017  . Right leg weakness 12/19/2016  . Acute upper respiratory infection 03/13/2016  . Cervical radiculitis 11/29/2015  . Numbness 11/29/2015  . Squamous cell carcinoma 05/25/2015  . Neck pain on right  side 05/03/2015  . 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 Ataxic gait 11/10/2014  . High risk medication use 11/10/2014  . Urinary frequency 11/10/2014  . Right carpal tunnel syndrome 11/10/2014  . Influenza-like symptoms 02/03/2014  . Allergic rhinitis 09/14/2013  . ADD (attention deficit disorder) 03/10/2012  . Tobacco use 09/10/2011  . Visit for preventive health examination 09/10/2011  . MS (multiple sclerosis) ([place]) 09/04/2011   Past Medical History:  Diagnosis Date  . ADD (attention deficit disorder with hyperactivity)   . Arthritis   . Cervical dysplasia   . GERD (gastroesophageal reflux disease)   . Mitral valve prolapse   . Multiple sclerosis ([place])   . Multiple sclerosis ([place])   . Tendonitis in rigtht wrist     Family History  Problem Relation Age of Onset  . Depression Mother   . Stroke Mother   . Heart disease Mother   . Dementia Mother   . Cancer Neg Hx   . Diabetes Neg Hx     Past Surgical History:  Procedure Laterality Date  . ABDOMINAL HYSTERECTOMY  2004   ovaries retained  . BACK SURGERY    . TUBAL LIGATION     Social History   Occupational History  . Not on file  Tobacco Use  . Smoking status: Current Some Day Smoker    Packs/day: 0.25    Types: Cigarettes  . Smokeless tobacco: Never Used  Substance and Sexual Activity  . Alcohol use: No    Alcohol/week: 0.0 oz    Comment: Occasioanl glass of wine   . Drug use: No  . Sexual activity: Not Currently

## 2017-11-05 NOTE — Addendum Note (Signed)
Addended by: Cyd Silence on: 11/05/2017 09:35 AM   Modules accepted: Orders

## 2017-11-07 ENCOUNTER — Encounter (HOSPITAL_BASED_OUTPATIENT_CLINIC_OR_DEPARTMENT_OTHER): Payer: Self-pay | Admitting: *Deleted

## 2017-11-07 ENCOUNTER — Other Ambulatory Visit: Payer: Self-pay

## 2017-11-12 NOTE — Anesthesia Preprocedure Evaluation (Addendum)
Anesthesia Evaluation  Patient identified by MRN, date of birth, ID band Patient awake    Reviewed: Allergy & Precautions, NPO status , Patient's Chart, lab work & pertinent test results  Airway Mallampati: II  TM Distance: >3 FB Neck ROM: Full    Dental no notable dental hx. (+) Teeth Intact   Pulmonary Current Smoker,    Pulmonary exam normal breath sounds clear to auscultation       Cardiovascular negative cardio ROS Normal cardiovascular exam Rhythm:Regular Rate:Normal     Neuro/Psych Anxiety Hx of MS w Right LE weakness and ataxic gait  Neuromuscular disease    GI/Hepatic GERD  Medicated,  Endo/Other    Renal/GU negative Renal ROS     Musculoskeletal   Abdominal   Peds  Hematology   Anesthesia Other Findings   Reproductive/Obstetrics                             Lab Results  Component Value Date   WBC 7.0 10/29/2017   HGB 12.6 10/29/2017   HCT 38.1 10/29/2017   MCV 86 10/29/2017   PLT 298 10/29/2017    Anesthesia Physical Anesthesia Plan  ASA: II  Anesthesia Plan: General   Post-op Pain Management:    Induction: Intravenous  PONV Risk Score and Plan: 2 and Dexamethasone, Ondansetron and Treatment may vary due to age or medical condition  Airway Management Planned: LMA  Additional Equipment:   Intra-op Plan:   Post-operative Plan:   Informed Consent: I have reviewed the patients History and Physical, chart, labs and discussed the procedure including the risks, benefits and alternatives for the proposed anesthesia with the patient or authorized representative who has indicated his/her understanding and acceptance.     Plan Discussed with: CRNA  Anesthesia Plan Comments:         Anesthesia Quick Evaluation

## 2017-11-13 ENCOUNTER — Ambulatory Visit (HOSPITAL_BASED_OUTPATIENT_CLINIC_OR_DEPARTMENT_OTHER)
Admission: RE | Admit: 2017-11-13 | Discharge: 2017-11-13 | Disposition: A | Discharge status: Home / Self Care | Payer: 59 | Source: Ambulatory Visit | Attending: Orthopaedic Surgery | Admitting: Orthopaedic Surgery

## 2017-11-13 ENCOUNTER — Ambulatory Visit (HOSPITAL_COMMUNITY): Payer: 59

## 2017-11-13 ENCOUNTER — Ambulatory Visit (HOSPITAL_BASED_OUTPATIENT_CLINIC_OR_DEPARTMENT_OTHER): Payer: 59 | Admitting: Anesthesiology

## 2017-11-13 ENCOUNTER — Encounter (HOSPITAL_BASED_OUTPATIENT_CLINIC_OR_DEPARTMENT_OTHER): Payer: Self-pay | Admitting: Anesthesiology

## 2017-11-13 ENCOUNTER — Other Ambulatory Visit: Payer: Self-pay

## 2017-11-13 ENCOUNTER — Encounter (HOSPITAL_BASED_OUTPATIENT_CLINIC_OR_DEPARTMENT_OTHER)
Admission: RE | Disposition: A | Discharge status: Home / Self Care | Payer: Self-pay | Source: Ambulatory Visit | Attending: Orthopaedic Surgery

## 2017-11-13 DIAGNOSIS — K219 Gastro-esophageal reflux disease without esophagitis: Secondary | ICD-10-CM | POA: Diagnosis not present

## 2017-11-13 DIAGNOSIS — M84469A Pathological fracture, unspecified tibia and fibula, initial encounter for fracture: Secondary | ICD-10-CM

## 2017-11-13 DIAGNOSIS — I341 Nonrheumatic mitral (valve) prolapse: Secondary | ICD-10-CM | POA: Insufficient documentation

## 2017-11-13 DIAGNOSIS — M84462A Pathological fracture, left tibia, initial encounter for fracture: Secondary | ICD-10-CM | POA: Insufficient documentation

## 2017-11-13 DIAGNOSIS — G35 Multiple sclerosis: Secondary | ICD-10-CM | POA: Diagnosis not present

## 2017-11-13 DIAGNOSIS — Z888 Allergy status to other drugs, medicaments and biological substances status: Secondary | ICD-10-CM | POA: Diagnosis not present

## 2017-11-13 DIAGNOSIS — S82142A Displaced bicondylar fracture of left tibia, initial encounter for closed fracture: Secondary | ICD-10-CM | POA: Diagnosis not present

## 2017-11-13 DIAGNOSIS — F17210 Nicotine dependence, cigarettes, uncomplicated: Secondary | ICD-10-CM | POA: Diagnosis not present

## 2017-11-13 DIAGNOSIS — Z79899 Other long term (current) drug therapy: Secondary | ICD-10-CM | POA: Insufficient documentation

## 2017-11-13 DIAGNOSIS — M1990 Unspecified osteoarthritis, unspecified site: Secondary | ICD-10-CM | POA: Insufficient documentation

## 2017-11-13 DIAGNOSIS — M94262 Chondromalacia, left knee: Secondary | ICD-10-CM | POA: Diagnosis not present

## 2017-11-13 DIAGNOSIS — M259 Joint disorder, unspecified: Secondary | ICD-10-CM

## 2017-11-13 DIAGNOSIS — S83242A Other tear of medial meniscus, current injury, left knee, initial encounter: Secondary | ICD-10-CM | POA: Diagnosis not present

## 2017-11-13 DIAGNOSIS — Z85828 Personal history of other malignant neoplasm of skin: Secondary | ICD-10-CM | POA: Insufficient documentation

## 2017-11-13 DIAGNOSIS — M659 Synovitis and tenosynovitis, unspecified: Secondary | ICD-10-CM | POA: Diagnosis not present

## 2017-11-13 DIAGNOSIS — F909 Attention-deficit hyperactivity disorder, unspecified type: Secondary | ICD-10-CM | POA: Insufficient documentation

## 2017-11-13 DIAGNOSIS — M65862 Other synovitis and tenosynovitis, left lower leg: Secondary | ICD-10-CM | POA: Insufficient documentation

## 2017-11-13 DIAGNOSIS — Z8619 Personal history of other infectious and parasitic diseases: Secondary | ICD-10-CM | POA: Insufficient documentation

## 2017-11-13 DIAGNOSIS — Z96652 Presence of left artificial knee joint: Secondary | ICD-10-CM | POA: Diagnosis not present

## 2017-11-13 DIAGNOSIS — M23204 Derangement of unspecified medial meniscus due to old tear or injury, left knee: Secondary | ICD-10-CM | POA: Diagnosis present

## 2017-11-13 DIAGNOSIS — F419 Anxiety disorder, unspecified: Secondary | ICD-10-CM | POA: Insufficient documentation

## 2017-11-13 DIAGNOSIS — Z471 Aftercare following joint replacement surgery: Secondary | ICD-10-CM | POA: Diagnosis not present

## 2017-11-13 HISTORY — DX: Anxiety disorder, unspecified: F41.9

## 2017-11-13 HISTORY — DX: Headache: R51

## 2017-11-13 HISTORY — DX: Malignant (primary) neoplasm, unspecified: C80.1

## 2017-11-13 HISTORY — DX: Headache, unspecified: R51.9

## 2017-11-13 HISTORY — DX: Inflammatory liver disease, unspecified: K75.9

## 2017-11-13 HISTORY — PX: KNEE ARTHROSCOPY WITH SUBCHONDROPLASTY: SHX6732

## 2017-11-13 SURGERY — ARTHROSCOPY, KNEE, WITH SUBCHONDROPLASTY
Anesthesia: General | Site: Knee | Laterality: Left

## 2017-11-13 MED ORDER — HYDROCODONE-ACETAMINOPHEN 5-325 MG PO TABS
1.0000 | ORAL_TABLET | Freq: Once | ORAL | Status: AC
  Administered 2017-11-13: 1 via ORAL

## 2017-11-13 MED ORDER — OXYCODONE-ACETAMINOPHEN 5-325 MG PO TABS: 1.0000 | ORAL_TABLET | ORAL | 0 refills | Status: DC | PRN

## 2017-11-13 MED ORDER — CLINDAMYCIN PHOSPHATE 900 MG/50ML IV SOLN
INTRAVENOUS | Status: AC
  Filled 2017-11-13: qty 50

## 2017-11-13 MED ORDER — ACETAMINOPHEN 500 MG PO TABS
ORAL_TABLET | ORAL | Status: AC
  Filled 2017-11-13: qty 2

## 2017-11-13 MED ORDER — ACETAMINOPHEN 500 MG PO TABS
1000.0000 mg | ORAL_TABLET | Freq: Once | ORAL | Status: AC
  Administered 2017-11-13: 1000 mg via ORAL

## 2017-11-13 MED ORDER — HYDROCODONE-ACETAMINOPHEN 5-325 MG PO TABS
ORAL_TABLET | ORAL | Status: AC
  Filled 2017-11-13: qty 1

## 2017-11-13 MED ORDER — ONDANSETRON HCL 4 MG PO TABS: 4.0000 mg | ORAL_TABLET | Freq: Three times a day (TID) | ORAL | 0 refills | Status: DC | PRN

## 2017-11-13 MED ORDER — CALCIUM CARBONATE-VITAMIN D 500-200 MG-UNIT PO TABS: 1.0000 | ORAL_TABLET | Freq: Three times a day (TID) | ORAL | 12 refills | Status: DC

## 2017-11-13 MED ORDER — ACETAMINOPHEN 10 MG/ML IV SOLN
1000.0000 mg | Freq: Once | INTRAVENOUS | Status: DC | PRN
Start: 2017-11-13 — End: 2017-11-13

## 2017-11-13 MED ORDER — HYDROMORPHONE HCL 1 MG/ML IJ SOLN: 0.2500 mg | INTRAMUSCULAR | Status: DC | PRN

## 2017-11-13 MED ORDER — LACTATED RINGERS IV SOLN
INTRAVENOUS | Status: DC
  Administered 2017-11-13 (×2): via INTRAVENOUS

## 2017-11-13 MED ORDER — FENTANYL CITRATE (PF) 100 MCG/2ML IJ SOLN
INTRAMUSCULAR | Status: DC | PRN
  Administered 2017-11-13: 25 ug via INTRAVENOUS
  Administered 2017-11-13: 50 ug via INTRAVENOUS
  Administered 2017-11-13: 25 ug via INTRAVENOUS
  Administered 2017-11-13: 50 ug via INTRAVENOUS
  Administered 2017-11-13: 100 ug via INTRAVENOUS
  Administered 2017-11-13: 25 ug via INTRAVENOUS

## 2017-11-13 MED ORDER — HYDROCODONE-ACETAMINOPHEN 7.5-325 MG PO TABS: 1.0000 | ORAL_TABLET | Freq: Once | ORAL | Status: DC | PRN

## 2017-11-13 MED ORDER — SCOPOLAMINE 1 MG/3DAYS TD PT72: 1.0000 | MEDICATED_PATCH | Freq: Once | TRANSDERMAL | Status: DC | PRN

## 2017-11-13 MED ORDER — FENTANYL CITRATE (PF) 100 MCG/2ML IJ SOLN: 50.0000 ug | INTRAMUSCULAR | Status: DC | PRN

## 2017-11-13 MED ORDER — ONDANSETRON HCL 4 MG/2ML IJ SOLN
INTRAMUSCULAR | Status: AC
  Filled 2017-11-13: qty 2

## 2017-11-13 MED ORDER — MIDAZOLAM HCL 2 MG/2ML IJ SOLN: 1.0000 mg | INTRAMUSCULAR | Status: DC | PRN

## 2017-11-13 MED ORDER — ONDANSETRON HCL 4 MG/2ML IJ SOLN
INTRAMUSCULAR | Status: DC | PRN
  Administered 2017-11-13: 4 mg via INTRAVENOUS

## 2017-11-13 MED ORDER — FENTANYL CITRATE (PF) 100 MCG/2ML IJ SOLN
INTRAMUSCULAR | Status: AC
  Filled 2017-11-13: qty 2

## 2017-11-13 MED ORDER — ZINC SULFATE 220 (50 ZN) MG PO CAPS: 220.0000 mg | ORAL_CAPSULE | Freq: Every day | ORAL | 0 refills | Status: DC

## 2017-11-13 MED ORDER — BUPIVACAINE HCL (PF) 0.25 % IJ SOLN
INTRAMUSCULAR | Status: DC | PRN
  Administered 2017-11-13: 20 mL

## 2017-11-13 MED ORDER — HYDROMORPHONE HCL 1 MG/ML IJ SOLN
INTRAMUSCULAR | Status: AC
  Filled 2017-11-13: qty 0.5

## 2017-11-13 MED ORDER — MIDAZOLAM HCL 2 MG/2ML IJ SOLN
INTRAMUSCULAR | Status: AC
  Filled 2017-11-13: qty 2

## 2017-11-13 MED ORDER — PROPOFOL 10 MG/ML IV BOLUS
INTRAVENOUS | Status: DC | PRN
  Administered 2017-11-13: 200 mg via INTRAVENOUS
  Administered 2017-11-13: 100 mg via INTRAVENOUS

## 2017-11-13 MED ORDER — CHLORHEXIDINE GLUCONATE 4 % EX LIQD: 60.0000 mL | Freq: Once | CUTANEOUS | Status: DC

## 2017-11-13 MED ORDER — PROMETHAZINE HCL 25 MG PO TABS: 25.0000 mg | ORAL_TABLET | Freq: Four times a day (QID) | ORAL | 1 refills | Status: DC | PRN

## 2017-11-13 MED ORDER — MIDAZOLAM HCL 5 MG/5ML IJ SOLN
INTRAMUSCULAR | Status: DC | PRN
  Administered 2017-11-13: 2 mg via INTRAVENOUS

## 2017-11-13 MED ORDER — LIDOCAINE HCL (CARDIAC) PF 100 MG/5ML IV SOSY
PREFILLED_SYRINGE | INTRAVENOUS | Status: AC
  Filled 2017-11-13: qty 5

## 2017-11-13 MED ORDER — LIDOCAINE HCL (CARDIAC) PF 100 MG/5ML IV SOSY
PREFILLED_SYRINGE | INTRAVENOUS | Status: DC | PRN
  Administered 2017-11-13: 30 mg via INTRAVENOUS

## 2017-11-13 MED ORDER — PROPOFOL 10 MG/ML IV BOLUS
INTRAVENOUS | Status: AC
  Filled 2017-11-13: qty 20

## 2017-11-13 MED ORDER — DEXAMETHASONE SODIUM PHOSPHATE 10 MG/ML IJ SOLN
INTRAMUSCULAR | Status: AC
  Filled 2017-11-13: qty 1

## 2017-11-13 MED ORDER — MEPERIDINE HCL 25 MG/ML IJ SOLN: 6.2500 mg | INTRAMUSCULAR | Status: DC | PRN

## 2017-11-13 MED ORDER — PROMETHAZINE HCL 25 MG/ML IJ SOLN: 6.2500 mg | INTRAMUSCULAR | Status: DC | PRN

## 2017-11-13 MED ORDER — CLINDAMYCIN PHOSPHATE 900 MG/50ML IV SOLN
900.0000 mg | INTRAVENOUS | Status: AC
  Administered 2017-11-13: 900 mg via INTRAVENOUS

## 2017-11-13 MED ORDER — LACTATED RINGERS IV SOLN
INTRAVENOUS | Status: DC
  Administered 2017-11-13: 08:00:00 via INTRAVENOUS

## 2017-11-13 MED FILL — OXYCODONE-ACETAMINOPHEN 5-3: 5-325 | 3 days supply | Qty: 30 | Fill #0

## 2017-11-13 MED FILL — PROMETHAZINE 25 MG TABLET: 25 | 7 days supply | Qty: 30 | Fill #0

## 2017-11-13 SURGICAL SUPPLY — 46 items
BANDAGE ACE 6X5 VEL STRL LF (GAUZE/BANDAGES/DRESSINGS) ×4 IMPLANT
BANDAGE ESMARK 6X9 LF (GAUZE/BANDAGES/DRESSINGS) IMPLANT
BLADE 4.2CUDA (BLADE) IMPLANT
BLADE CUDA GRT WHITE 3.5 (BLADE) IMPLANT
BLADE CUDA SHAVER 3.5 (BLADE) IMPLANT
BLADE CUTTER GATOR 3.5 (BLADE) IMPLANT
BNDG ESMARK 6X9 LF (GAUZE/BANDAGES/DRESSINGS)
BUR OVAL 4.0 (BURR) IMPLANT
CUFF TOURNIQUET SINGLE 34IN LL (TOURNIQUET CUFF) ×2 IMPLANT
DRAPE ARTHROSCOPY W/POUCH 90 (DRAPES) ×2 IMPLANT
DRAPE C-ARM 42X72 X-RAY (DRAPES) ×2 IMPLANT
DRAPE C-ARMOR (DRAPES) ×2 IMPLANT
DRAPE IMP U-DRAPE 54X76 (DRAPES) IMPLANT
DRAPE U-SHAPE 47X51 STRL (DRAPES) ×2 IMPLANT
DURAPREP 26ML APPLICATOR (WOUND CARE) ×2 IMPLANT
ELECT MENISCUS 165MM 90D (ELECTRODE) IMPLANT
ELECT REM PT RETURN 9FT ADLT (ELECTROSURGICAL)
ELECTRODE REM PT RTRN 9FT ADLT (ELECTROSURGICAL) IMPLANT
GAUZE SPONGE 4X4 12PLY STRL (GAUZE/BANDAGES/DRESSINGS) ×2 IMPLANT
GAUZE XEROFORM 1X8 LF (GAUZE/BANDAGES/DRESSINGS) ×2 IMPLANT
GLOVE BIOGEL PI IND STRL 7.0 (GLOVE) ×1 IMPLANT
GLOVE BIOGEL PI INDICATOR 7.0 (GLOVE) ×1
GLOVE ECLIPSE 7.0 STRL STRAW (GLOVE) ×2 IMPLANT
GLOVE SKINSENSE NS SZ7.5 (GLOVE) ×1
GLOVE SKINSENSE STRL SZ7.5 (GLOVE) ×1 IMPLANT
GLOVE SURG SYN 7.5  E (GLOVE) ×1
GLOVE SURG SYN 7.5 E (GLOVE) ×1 IMPLANT
GOWN STRL REIN XL XLG (GOWN DISPOSABLE) ×2 IMPLANT
GOWN STRL REUS W/ TWL LRG LVL3 (GOWN DISPOSABLE) ×1 IMPLANT
GOWN STRL REUS W/ TWL XL LVL3 (GOWN DISPOSABLE) ×1 IMPLANT
GOWN STRL REUS W/TWL LRG LVL3 (GOWN DISPOSABLE) ×1
GOWN STRL REUS W/TWL XL LVL3 (GOWN DISPOSABLE) ×2
KIT MIXER ACCUMIX (KITS) ×2 IMPLANT
KNEE KIT SCP W/SIDE ACCUPORT (Joint) ×2 IMPLANT
KNEE WRAP E Z 3 GEL PACK (MISCELLANEOUS) ×2 IMPLANT
MANIFOLD NEPTUNE II (INSTRUMENTS) ×2 IMPLANT
PACK ARTHROSCOPY DSU (CUSTOM PROCEDURE TRAY) ×2 IMPLANT
PACK BASIN DAY SURGERY FS (CUSTOM PROCEDURE TRAY) ×2 IMPLANT
PENCIL BUTTON HOLSTER BLD 10FT (ELECTRODE) IMPLANT
RESECTOR FULL RADIUS 4.2MM (BLADE) IMPLANT
SHEET MEDIUM DRAPE 40X70 STRL (DRAPES) ×2 IMPLANT
SUT ETHILON 3 0 PS 1 (SUTURE) ×2 IMPLANT
TOWEL GREEN STERILE FF (TOWEL DISPOSABLE) ×2 IMPLANT
TOWEL OR NON WOVEN STRL DISP B (DISPOSABLE) IMPLANT
TUBING ARTHRO INFLOW-ONLY STRL (TUBING) ×2 IMPLANT
WATER STERILE IRR 1000ML POUR (IV SOLUTION) ×2 IMPLANT

## 2017-11-13 NOTE — Transfer of Care (Signed)
Immediate Anesthesia Transfer of Care Note  Patient: [name]  Procedure(s) Performed: LEFT KNEE ARTHROSCOPY WITH SUBCHONDROPLASTY, PARTIAL MEDIAL MENISCECTOMY, SYNOVECTOMY (Left Knee)  Patient Location: PACU  Anesthesia Type:General  Level of Consciousness: sedated  Airway & Oxygen Therapy: Patient Spontanous Breathing and Patient connected to face mask oxygen  Post-op Assessment: Report given to RN and Post -op Vital signs reviewed and stable  Post vital signs: Reviewed and stable  Last Vitals:  Vitals Value Taken Time  BP    Temp    Pulse    Resp    SpO2      Last Pain:  Vitals:   11/13/17 0738  TempSrc: Oral  PainSc: 6       Patients Stated Pain Goal: 3 (73/[id number])  Complications: No apparent anesthesia complications

## 2017-11-13 NOTE — Discharge Instructions (Signed)

## 2017-11-13 NOTE — Op Note (Signed)
Surgery Date: 11/13/2017  Surgeon(s): Leandrew Koyanagi, MD  ASSIST: Madalyn Rob, [place]; necessary for the timely completion of procedure and due to complexity of procedure.  ANESTHESIA:  general  FLUIDS: Per anesthesia record.   ESTIMATED BLOOD LOSS: minimal  PREOPERATIVE DIAGNOSES:  1. Degenerative left knee medial meniscus tear 2. Left knee synovitis 3. Left medial tibial plateau insufficiency fracture  POSTOPERATIVE DIAGNOSES:  same  PROCEDURES PERFORMED:  1. Left knee arthroscopy with major synovectomy 2. Left knee arthroscopically assisted internal fixation/subchondroplasty of medial tibial plateau insufficiency fracture 3. Left knee arthroscopic chondroplasty of medial femoral condyle, medial tibial plateau, femoral trochlea, patella  DESCRIPTION OF PROCEDURE: Ms. Meckley is a 57 y.o.-year-old female with left knee pain. Plans are to proceed with diagnostic arthroscopy with debridement as indicated. Full discussion held regarding risks benefits alternatives and complications related surgical intervention. Conservative care options reviewed. All questions answered.  The patient was identified in the preoperative holding area and the operative extremity was marked. The patient was brought to the operating room and transferred to operating table in a supine position. Satisfactory general anesthesia was induced by anesthesiology.    Standard anterolateral, anteromedial arthroscopy portals were obtained. The anteromedial portal was obtained with a spinal needle for localization under direct visualization with subsequent diagnostic findings.   We first began with a major synovectomy in all 3 compartments of the knee.  This was performed using oscillating shaver.  We then repositioned the arthroscope into the medial compartment.  We used a probe to assess the medial meniscus which did not show any displaced tears.  There was some degenerative changes but overall the meniscus  was intact.  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  After this was done we then assessed the ACL which was stable.  The lateral compartment was also unremarkable.  We brought the knee out into full extension in order to address the patellofemoral compartment.  There was widespread unstable chondromalacia of the femoral trochlea and patella.  Again chondroplasty was performed using a full-radius shaver back to stable border.  Once this was done we then remove the arthroscope and turned our attention to the sub-chondroplasty.  Using fluoroscopic guidance I was able to target the insufficiency fracture of the tibial plateau.  The cannulated drill was then advanced to the appropriate depth.  5 cc of calcium phosphate cement was then injected into the insufficiency fracture using fluoroscopy.  The cement was then allowed to harden.  The cannulated drill bit was then removed.  Fluoroscopy was used to confirm appropriate calcium phosphate filling of the defect.  The arthroscope was then placed back into the knee to confirm no intra-articular extravasation of the cement.  Suprapatellar pouch and gutters: severe synovitis or debris. Patella chondral surface: Grade 4 Trochlear chondral surface: Grade 4 Patellofemoral tracking: mildly lateral Medial meniscus: degenerative without any unstable tears.  Medial femoral condyle flexion bearing surface: Grade 4 Medial femoral condyle extension bearing surface: Grade 4 Medial tibial plateau: Grade 4 Anterior cruciate ligament:stable Posterior cruciate ligament:stable Lateral meniscus: normal.   Lateral femoral condyle flexion bearing surface: Grade 1 Lateral femoral condyle extension bearing surface: Grade 1 Lateral tibial plateau: Grade 1  DISPOSITION: The patient was awakened from general anesthetic, extubated, taken to the  recovery room in medically stable condition, no apparent complications. The patient may be weightbearing as tolerated to the operative lower  extremity.  Range of motion of right knee as tolerated.  Azucena Cecil, MD [place] 9:08 AM

## 2017-11-13 NOTE — H&P (Signed)
PREOPERATIVE H&P  Chief Complaint: left knee medial meniscal tear, synovitis, tibial plateau insufficiency fracture  HPI: [name] is a 57 y.o. female who presents for surgical treatment of left knee medial meniscal tear, synovitis, tibial plateau insufficiency fracture.  She denies any changes in medical history.  Past Medical History:  Diagnosis Date  . ADD (attention deficit disorder with hyperactivity)   . Anxiety   . Arthritis   . Cancer ([place])    Skin cancer- basal cell, squamous cell  . Cervical dysplasia   . GERD (gastroesophageal reflux disease)   . Headache    migraine  . Hepatitis    Pt states Core and surface antibioties for Hepatitis whens he was 40  . Mitral valve prolapse   . Multiple sclerosis ([place])   . Multiple sclerosis ([place])   . Tendonitis in rigtht wrist    Past Surgical History:  Procedure Laterality Date  . ABDOMINAL HYSTERECTOMY  2004   ovaries retained  . BACK SURGERY    . heart ablation     15 yrs ago for a fib by Dr.Klein, everything ok  . TUBAL LIGATION     Social History   Socioeconomic History  . Marital status: Divorced    Spouse name: Not on file  . Number of children: Not on file  . Years of education: Not on file  . Highest education level: Not on file  Occupational History  . Not on file  Social Needs  . Financial resource strain: Not on file  . Food insecurity:    Worry: Not on file    Inability: Not on file  . Transportation needs:    Medical: Not on file    Non-medical: Not on file  Tobacco Use  . Smoking status: Current Some Day Smoker    Packs/day: 0.25    Types: Cigarettes  . Smokeless tobacco: Never Used  Substance and Sexual Activity  . Alcohol use: Yes    Alcohol/week: 0.0 oz    Comment: Occasioanl glass of wine   . Drug use: No  . Sexual activity: Yes    Birth control/protection: Surgical  Lifestyle  . Physical activity:    Days per week: Not on file    Minutes per session: Not on file  .  Stress: Not on file  Relationships  . Social connections:    Talks on phone: Not on file    Gets together: Not on file    Attends religious service: Not on file    Active member of club or organization: Not on file    Attends meetings of clubs or organizations: Not on file    Relationship status: Not on file  Other Topics Concern  . Not on file  Social History Narrative  . Not on file   Family History  Problem Relation Age of Onset  . Depression Mother   . Stroke Mother   . Heart disease Mother   . Dementia Mother   . Cancer Neg Hx   . Diabetes Neg Hx    Allergies  Allergen Reactions  . Methylprednisolone Hives  . Solu-Medrol [Methylprednisolone Acetate] Hives and Itching  . Cephalexin Hives and Itching   Prior to Admission medications   Medication Sig Start Date End Date Taking? Authorizing Provider  diclofenac (VOLTAREN) 75 MG EC tablet Take 1 tablet (75 mg total) by mouth 2 (two) times daily. 10/16/17  Yes Hudnall, Sharyn Lull, MD  FLUoxetine (PROZAC) 20 MG capsule Take 3 capsules daily  01/09/17  Yes Sater, Nanine Means, MD  lisdexamfetamine (VYVANSE) 60 MG capsule Take 1 capsule (60 mg total) by mouth every morning. 10/31/17  Yes Arfeen, Arlyce Harman, MD  mirabegron ER (MYRBETRIQ) 50 MG TB24 tablet Take 1 tablet (50 mg total) by mouth daily. 10/29/17  Yes Sater, Nanine Means, MD  oxybutynin (DITROPAN XL) 10 MG 24 hr tablet Take 1 tablet (10 mg total) by mouth at bedtime. 10/29/17  Yes Sater, Nanine Means, MD  pantoprazole (PROTONIX) 40 MG tablet Take 40 mg by mouth daily.   Yes [provider]  traMADol (ULTRAM) 50 MG tablet Take 1-2 tablets (50-100 mg total) by mouth 3 (three) times daily as needed. 11/05/17  Yes Leandrew Koyanagi, MD  Ocrelizumab (OCREVUS IV) De Nurse    [provider]  ocrelizumab 600 mg in sodium chloride 0.9 % 500 mL Inject 600 mg into the vein every 6 (six) months.    [provider]     Positive ROS: All other systems have been reviewed and were  otherwise negative with the exception of those mentioned in the HPI and as above.  Physical Exam: General: Alert, no acute distress Cardiovascular: No pedal edema Respiratory: No cyanosis, no use of accessory musculature GI: abdomen soft Skin: No lesions in the area of chief complaint Neurologic: Sensation intact distally Psychiatric: Patient is competent for consent with normal mood and affect Lymphatic: no lymphedema  MUSCULOSKELETAL: exam stable  Assessment: left knee medial meniscal tear, synovitis, tibial plateau insufficiency fracture  Plan: Plan for Procedure(s): LEFT KNEE ARTHROSCOPY WITH SUBCHONDROPLASTY, PARTIAL MEDIAL MENISCECTOMY, SYNOVECTOMY  The risks benefits and alternatives were discussed with the patient including but not limited to the risks of nonoperative treatment, versus surgical intervention including infection, bleeding, nerve injury,  blood clots, cardiopulmonary complications, morbidity, mortality, among others, and they were willing to proceed.   Eduard Roux, MD   11/13/2017 8:43 AM

## 2017-11-13 NOTE — Anesthesia Postprocedure Evaluation (Signed)
Anesthesia Post Note  Patient: [name]  Procedure(s) Performed: LEFT KNEE ARTHROSCOPY WITH SUBCHONDROPLASTY, PARTIAL MEDIAL MENISCECTOMY, SYNOVECTOMY (Left Knee)     Patient location during evaluation: PACU Anesthesia Type: General Level of consciousness: awake and alert Pain management: pain level controlled Vital Signs Assessment: post-procedure vital signs reviewed and stable Respiratory status: spontaneous breathing, nonlabored ventilation, respiratory function stable and patient connected to nasal cannula oxygen Cardiovascular status: blood pressure returned to baseline and stable Postop Assessment: no apparent nausea or vomiting Anesthetic complications: no    Last Vitals:  Vitals:   11/13/17 1130 11/13/17 1200  BP: (!) 150/92 (!) 143/83  Pulse: 84 94  Resp: 17 18  Temp:  36.5 C  SpO2: 95% 94%    Last Pain:  Vitals:   11/13/17 1200  TempSrc:   PainSc: 6                  Barnet Glasgow

## 2017-11-13 NOTE — Anesthesia Procedure Notes (Signed)
Procedure Name: LMA Insertion Date/Time: 11/13/2017 9:17 AM Performed by: Marrianne Mood, CRNA Pre-anesthesia Checklist: Patient identified, Emergency Drugs available, Suction available, Patient being monitored and Timeout performed Patient Re-evaluated:Patient Re-evaluated prior to induction Oxygen Delivery Method: Circle system utilized Preoxygenation: Pre-oxygenation with 100% oxygen Induction Type: IV induction Ventilation: Mask ventilation without difficulty LMA: LMA inserted LMA Size: 4.0 Number of attempts: 1 Airway Equipment and Method: Bite block Placement Confirmation: positive ETCO2 Tube secured with: Tape Dental Injury: Teeth and Oropharynx as per pre-operative assessment

## 2017-11-14 ENCOUNTER — Encounter (HOSPITAL_BASED_OUTPATIENT_CLINIC_OR_DEPARTMENT_OTHER): Payer: Self-pay | Admitting: Orthopaedic Surgery

## 2017-11-14 MED FILL — PANTOPRAZOLE SOD DR 40 MG T: 40 | 90 days supply | Qty: 90 | Fill #1

## 2017-11-28 ENCOUNTER — Ambulatory Visit (INDEPENDENT_AMBULATORY_CARE_PROVIDER_SITE_OTHER): Payer: 59 | Admitting: Orthopaedic Surgery

## 2017-11-28 ENCOUNTER — Encounter (INDEPENDENT_AMBULATORY_CARE_PROVIDER_SITE_OTHER): Payer: Self-pay | Admitting: Orthopaedic Surgery

## 2017-11-28 DIAGNOSIS — S83242A Other tear of medial meniscus, current injury, left knee, initial encounter: Secondary | ICD-10-CM

## 2017-11-28 DIAGNOSIS — M84362A Stress fracture, left tibia, initial encounter for fracture: Secondary | ICD-10-CM

## 2017-11-28 MED ORDER — OXYCODONE-ACETAMINOPHEN 5-325 MG PO TABS: 1.0000 | ORAL_TABLET | Freq: Two times a day (BID) | ORAL | 0 refills | Status: DC | PRN

## 2017-11-28 NOTE — Progress Notes (Signed)
[name] is 2-week status post left knee arthroscopy with debridement sub-chondroplasty.  Overall she is doing well.  She is requiring occasional oxycodone.  Her incisions have healed without any signs of infection.  Today we remove the sutures.  Her range of motion is progressing.  I would like to send her to therapy for initial quadricep strengthening gait training.  Continue out of work as patient's job demands requires significant standing and walking and physical activity.  Percocet was refilled today.  Arthroscopy pictures were reviewed with the patient.  Recheck in 4 weeks.

## 2017-11-28 NOTE — Addendum Note (Signed)
Addended by: Marlyne Beards on: 11/28/2017 10:51 AM   Modules accepted: Orders

## 2017-12-03 ENCOUNTER — Encounter: Payer: Self-pay | Admitting: Physical Therapy

## 2017-12-03 ENCOUNTER — Ambulatory Visit: Payer: 59 | Attending: Orthopaedic Surgery | Admitting: Physical Therapy

## 2017-12-03 ENCOUNTER — Other Ambulatory Visit: Payer: Self-pay

## 2017-12-03 DIAGNOSIS — R2689 Other abnormalities of gait and mobility: Secondary | ICD-10-CM | POA: Insufficient documentation

## 2017-12-03 DIAGNOSIS — M25562 Pain in left knee: Secondary | ICD-10-CM | POA: Insufficient documentation

## 2017-12-03 DIAGNOSIS — M25662 Stiffness of left knee, not elsewhere classified: Secondary | ICD-10-CM | POA: Diagnosis not present

## 2017-12-04 ENCOUNTER — Encounter: Payer: Self-pay | Admitting: Physical Therapy

## 2017-12-04 ENCOUNTER — Ambulatory Visit: Payer: 59 | Admitting: Physical Therapy

## 2017-12-04 DIAGNOSIS — M25662 Stiffness of left knee, not elsewhere classified: Secondary | ICD-10-CM

## 2017-12-04 DIAGNOSIS — R2689 Other abnormalities of gait and mobility: Secondary | ICD-10-CM | POA: Diagnosis not present

## 2017-12-04 DIAGNOSIS — M25562 Pain in left knee: Secondary | ICD-10-CM

## 2017-12-04 NOTE — Therapy (Signed)
[place], [place], [address] Phone: [phone]   Fax:  [phone]  Physical Therapy Evaluation  Patient Details  Name: [name] MRN: [id number] Date of Birth: [birth year] Referring Provider: Dr Frankey Shown    Encounter Date: 12/03/2017  PT End of Session - 12/04/17 0911    Visit Number  1    Number of Visits  12    Date for PT Re-Evaluation  01/15/18    Authorization Type  [employer]     PT Start Time  1497    PT Stop Time  1228    PT Time Calculation (min)  43 min    Activity Tolerance  Patient tolerated treatment well    Behavior During Therapy  [facility] for tasks assessed/performed       Past Medical History:  Diagnosis Date  . ADD (attention deficit disorder with hyperactivity)   . Anxiety   . Arthritis   . Cancer ([place])    Skin cancer- basal cell, squamous cell  . Cervical dysplasia   . GERD (gastroesophageal reflux disease)   . Headache    migraine  . Hepatitis    Pt states Core and surface antibioties for Hepatitis whens he was 32  . Mitral valve prolapse   . Multiple sclerosis ([place])   . Multiple sclerosis ([place])   . Tendonitis in rigtht wrist     Past Surgical History:  Procedure Laterality Date  . ABDOMINAL HYSTERECTOMY  2004   ovaries retained  . BACK SURGERY    . heart ablation     15 yrs ago for a fib by Dr.Klein, everything ok  . KNEE ARTHROSCOPY WITH SUBCHONDROPLASTY Left 11/13/2017   Procedure: LEFT KNEE ARTHROSCOPY WITH SUBCHONDROPLASTY, PARTIAL MEDIAL MENISCECTOMY, SYNOVECTOMY;  Surgeon: Leandrew Koyanagi, MD;  Location: [employer];  Service: Orthopedics;  Laterality: Left;  . TUBAL LIGATION      There were no vitals filed for this visit.   Subjective Assessment - 12/03/17 1153    Subjective  Patient had a left medial meniscal debirdement on 11/13/2017. Since that point she has been progressing but still feels increased painwhen she is up on the knee for too  long.     Pertinent History  Multiple Sclerosis, Stress fracture of the tibia     Limitations  Standing;Walking    How long can you stand comfortably?  10-15 minutes    How long can you walk comfortably?  can walk community distances but has pain     Diagnostic tests  nothing post-op     Patient Stated Goals  get back to work    Currently in Pain?  Yes    Pain Score  7  just a 1/10 without activity     Pain Orientation  Left    Pain Descriptors / Indicators  Aching    Pain Type  Surgical pain    Pain Radiating Towards  nothing at this time     Pain Onset  More than a month ago    Pain Frequency  Constant    Aggravating Factors   standing, walking    Pain Relieving Factors  rest, ice, pain meds     Effect of Pain on Daily Activities  unable to work at this time          [facility] PT Assessment - 12/04/17 0001      Assessment   Medical Diagnosis  Left Knee Medial meniscal debirdement and chndroplasty  Referring Provider  Dr Frankey Shown     Onset Date/Surgical Date  11/13/17    Hand Dominance  Left    Next MD Visit  12/27/2017    Prior Therapy  Nothing       Precautions   Precautions  None      Restrictions   Weight Bearing Restrictions  No      Balance Screen   Has the patient fallen in the past 6 months  No    Has the patient had a decrease in activity level because of a fear of falling?   No    Is the patient reluctant to leave their home because of a fear of falling?   No      Home Environment   Living Environment  Private residence    Additional Comments  14 steps to get to her bedroom       Prior Function   Level of Independence  Independent    Vocation  Full time employment    Vocation Requirements  Works in [employer]. Has to bew on her feet all the time     Leisure  Nothing       Cognition   Overall Cognitive Status  Within Functional Limits for tasks assessed    Attention  Focused    Focused Attention  Appears intact    Memory  Appears intact    Awareness  Appears  intact    Problem Solving  Appears intact      Observation/Other Assessments   Skin Integrity  well healed port holes       Observation/Other Assessments-Edema    Edema  Circumferential      Sensation   Additional Comments  denies parathesias       Coordination   Gross Motor Movements are Fluid and Coordinated  Yes    Fine Motor Movements are Fluid and Coordinated  Yes      Posture/Postural Control   Posture Comments  rounded shoulders; bilateral knee vlagus       AROM   Left Knee Extension  8    Left Knee Flexion  109      PROM   Left Knee Extension  4 Pain at end range     Left Knee Flexion  113      Strength   Left Hip Flexion  4+/5    Left Knee Extension  4+/5      Palpation   Palpation comment  no unexpected tenderness to palpation       Ambulation/Gait   Gait Comments  decreased lef single leg stance time; lateral shift to the right with gait                 Objective measurements completed on examination: See above findings.              PT Education - 12/04/17 0911    Education Details  HEP, symptom managment; activity progression     Person(s) Educated  Patient    Methods  Explanation;Demonstration;Tactile cues;Verbal cues;Handout    Comprehension  Verbalized understanding;Returned demonstration;Verbal cues required;Tactile cues required;Need further instruction       PT Short Term Goals - 12/03/17 1203      PT SHORT TERM GOAL #1   Title  Patient will be independent with basic HEP     Time  4    Period  Weeks    Status  New    Target Date  12/31/17  PT SHORT TERM GOAL #2   Title  Patient will demsotrate full pain free passive knee extension and flexion     Time  4    Period  Weeks    Status  New    Target Date  01/01/18      PT SHORT TERM GOAL #3   Title  Patient will increase left single leg stance to 15 sec     Time  4    Period  Weeks    Status  New    Target Date  01/01/18      PT SHORT TERM GOAL #4   Title   Patient will increase gorss left leg strength to 5/5     Time  4    Period  Weeks    Status  New    Target Date  01/01/18        PT Long Term Goals - 12/03/17 1203      PT LONG TERM GOAL #1   Title  Patient will stand for 2 hours without self reported increase in painand swelling in order to return to work     Time  8    Period  Weeks    Status  New    Target Date  01/28/18      PT LONG TERM GOAL #2   Title  Patient will go up/down 14 steps without pain in order to get to her bedroom     Time  8    Period  Weeks    Status  New    Target Date  01/29/18      PT LONG TERM GOAL #3   Title  Patient will demonstrate a 41 % limitation on FOTO     Time  8    Period  Weeks    Status  New    Target Date  01/29/18             Plan - 12/04/17 0912    Clinical Impression Statement  Patient ia a 57 year old female S/P left knee menisectomy and chondroplasty. She also reported a tibial stress fracture. She presents with expected limitations in strength, range of motion, and ability to stad and ambualte. Her biggest deficit is in extension which is likley effecting her ability to stand. She would benefit from skilled therapy to improve her ability to stand ant work and to regain full knee extension.     History and Personal Factors relevant to plan of care:  nothing     Clinical Presentation  Stable    Clinical Decision Making  Low    PT Frequency  2x / week    PT Duration  6 weeks    PT Treatment/Interventions  ADLs/Self Care Home Management;Cryotherapy;Electrical Stimulation;Iontophoresis 4mg /ml Dexamethasone;Moist Heat;Ultrasound;Gait training;Stair training;Therapeutic activities;Therapeutic exercise;Neuromuscular re-education;Patient/family education;Manual techniques;Passive range of motion;Taping    PT Next Visit Plan  continue with PROM with focus on extension; add bike; add bridge; add light leg press; add standing march add SAQ     PT Home Exercise Plan  quad sets; SLR;  knee extension stretch; supine ball squeeze     Consulted and Agree with Plan of Care  Patient       Patient will benefit from skilled therapeutic intervention in order to improve the following deficits and impairments:  Abnormal gait, Pain, Decreased endurance, Decreased activity tolerance, Decreased range of motion, Decreased strength, Difficulty walking  Visit Diagnosis: Acute pain of left knee  Stiffness of left knee,  not elsewhere classified  Other abnormalities of gait and mobility     Problem List Patient Active Problem List   Diagnosis Date Noted  . 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 Acute medial meniscus tear 11/05/2017  . Synovitis of left knee 11/05/2017  . Insufficiency fracture of tibia 11/05/2017  . Effusion, left knee 10/23/2017  . Left knee pain 10/20/2017  . Right leg weakness 12/19/2016  . Acute upper respiratory infection 03/13/2016  . Cervical radiculitis 11/29/2015  . Numbness 11/29/2015  . Squamous cell carcinoma 05/25/2015  . Neck pain on right side 05/03/2015  . 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 Ataxic gait 11/10/2014  . High risk medication use 11/10/2014  . Urinary frequency 11/10/2014  . Right carpal tunnel syndrome 11/10/2014  . Influenza-like symptoms 02/03/2014  . Allergic rhinitis 09/14/2013  . ADD (attention deficit disorder) 03/10/2012  . Tobacco use 09/10/2011  . Visit for preventive health examination 09/10/2011  . MS (multiple sclerosis) ([place]) 09/04/2011    Carney Living PT DPT  12/04/2017, 9:29 AM  [facility] [address] [place], [place], [address] Phone: [phone]   Fax:  [phone]  Name: HONESTY MENTA MRN: [id number] Date of Birth: Mar 30, 1961

## 2017-12-05 NOTE — Therapy (Signed)
[place], [place], [address] Phone: [phone]   Fax:  [phone]  Physical Therapy Treatment  Patient Details  Name: [name] MRN: [id number] Date of Birth: [birth year] Referring Provider: Dr Frankey Shown    Encounter Date: 12/04/2017  PT End of Session - 12/05/17 1053    Visit Number  2    Number of Visits  12    Date for PT Re-Evaluation  01/15/18    Authorization Type  [facility] [employer]     PT Start Time  0093    PT Stop Time  1058    PT Time Calculation (min)  43 min    Activity Tolerance  Patient tolerated treatment well    Behavior During Therapy  [facility] for tasks assessed/performed       Past Medical History:  Diagnosis Date  . ADD (attention deficit disorder with hyperactivity)   . Anxiety   . Arthritis   . Cancer ([place])    Skin cancer- basal cell, squamous cell  . Cervical dysplasia   . GERD (gastroesophageal reflux disease)   . Headache    migraine  . Hepatitis    Pt states Core and surface antibioties for Hepatitis whens he was 37  . Mitral valve prolapse   . Multiple sclerosis ([place])   . Multiple sclerosis ([place])   . Tendonitis in rigtht wrist     Past Surgical History:  Procedure Laterality Date  . ABDOMINAL HYSTERECTOMY  2004   ovaries retained  . BACK SURGERY    . heart ablation     15 yrs ago for a fib by Dr.Klein, everything ok  . KNEE ARTHROSCOPY WITH SUBCHONDROPLASTY Left 11/13/2017   Procedure: LEFT KNEE ARTHROSCOPY WITH SUBCHONDROPLASTY, PARTIAL MEDIAL MENISCECTOMY, SYNOVECTOMY;  Surgeon: Leandrew Koyanagi, MD;  Location: [facility];  Service: Orthopedics;  Laterality: Left;  . TUBAL LIGATION      There were no vitals filed for this visit.  Subjective Assessment - 12/04/17 1021    Subjective  Patient reports her knee is a little stiff and sore after the intial eval but she wouldnt call it pain     Pertinent History  Multiple Sclerosis, Stress fracture of the  tibia     Limitations  Standing;Walking    How long can you stand comfortably?  10-15 minutes    How long can you walk comfortably?  can walk community distances but has pain     Diagnostic tests  nothing post-op     Patient Stated Goals  get back to work    Currently in Pain?  No/denies "just a little tender "                       [facility] Adult PT Treatment/Exercise - 12/05/17 0001      Ambulation/Gait   Gait Comments  decreased lef single leg stance time; lateral shift to the right with gait       Posture/Postural Control   Posture Comments  rounded shoulders; bilateral knee vlagus       Knee/Hip Exercises: Stretches   Active Hamstring Stretch Limitations  3x20 sec hold with strap     Other Knee/Hip Stretches  standing gastroc stretch 2x20 sec hold       Knee/Hip Exercises: Standing   Heel Raises Limitations  2x10       Knee/Hip Exercises: Supine   Quad Sets Limitations  2x10 5 sec hold  Short Arc Quad Sets Limitations  2x10    Straight Leg Raises Limitations  2x10       Manual Therapy   Manual Therapy  Passive ROM    Manual therapy comments  PA and AP mobbilizations o improve extension     Passive ROM  stretch into extension              PT Education - 12/04/17 1022    Education Details  updated HEP; reviewed symptom mangement     Person(s) Educated  Patient    Methods  Explanation;Demonstration;Tactile cues;Handout;Verbal cues    Comprehension  Verbalized understanding;Returned demonstration;Verbal cues required;Tactile cues required       PT Short Term Goals - 12/03/17 1203      PT SHORT TERM GOAL #1   Title  Patient will be independent with basic HEP     Time  4    Period  Weeks    Status  New    Target Date  12/31/17      PT SHORT TERM GOAL #2   Title  Patient will demsotrate full pain free passive knee extension and flexion     Time  4    Period  Weeks    Status  New    Target Date  01/01/18      PT SHORT TERM GOAL #3    Title  Patient will increase left single leg stance to 15 sec     Time  4    Period  Weeks    Status  New    Target Date  01/01/18      PT SHORT TERM GOAL #4   Title  Patient will increase gorss left leg strength to 5/5     Time  4    Period  Weeks    Status  New    Target Date  01/01/18        PT Long Term Goals - 12/03/17 1203      PT LONG TERM GOAL #1   Title  Patient will stand for 2 hours without self reported increase in painand swelling in order to return to work     Time  8    Period  Weeks    Status  New    Target Date  01/28/18      PT LONG TERM GOAL #2   Title  Patient will go up/down 14 steps without pain in order to get to her bedroom     Time  8    Period  Weeks    Status  New    Target Date  01/29/18      PT LONG TERM GOAL #3   Title  Patient will demonstrate a 41 % limitation on FOTO     Time  8    Period  Weeks    Status  New    Target Date  01/29/18            Plan - 12/05/17 1054    Clinical Impression Statement  Patient tolerated treatment well. her extension improved with stretching. She did not report any major pain with treatment. Therapy added SAG, and hamstring stretch. Patient tried a bridge but had a pull in her hamstring. Therapy will continue to progress as tolerated    Clinical Presentation  Stable    Clinical Decision Making  Low    Rehab Potential  Good    PT Frequency  2x / week    PT Duration  6 weeks    PT Treatment/Interventions  ADLs/Self Care Home Management;Cryotherapy;Electrical Stimulation;Iontophoresis 4mg /ml Dexamethasone;Moist Heat;Ultrasound;Gait training;Stair training;Therapeutic activities;Therapeutic exercise;Neuromuscular re-education;Patient/family education;Manual techniques;Passive range of motion;Taping    PT Next Visit Plan  continue with PROM with focus on extension; add bike; add bridge; add light leg press; add standing march add SAQ     PT Home Exercise Plan  quad sets; SLR; knee extension stretch;  supine ball squeeze     Consulted and Agree with Plan of Care  Patient       Patient will benefit from skilled therapeutic intervention in order to improve the following deficits and impairments:  Abnormal gait, Pain, Decreased endurance, Decreased activity tolerance, Decreased range of motion, Decreased strength, Difficulty walking  Visit Diagnosis: Acute pain of left knee  Stiffness of left knee, not elsewhere classified  Other abnormalities of gait and mobility     Problem List Patient Active Problem List   Diagnosis Date Noted  . 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 Acute medial meniscus tear 11/05/2017  . Synovitis of left knee 11/05/2017  . Insufficiency fracture of tibia 11/05/2017  . Effusion, left knee 10/23/2017  . Left knee pain 10/20/2017  . Right leg weakness 12/19/2016  . Acute upper respiratory infection 03/13/2016  . Cervical radiculitis 11/29/2015  . Numbness 11/29/2015  . Squamous cell carcinoma 05/25/2015  . Neck pain on right side 05/03/2015  . 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 Ataxic gait 11/10/2014  . High risk medication use 11/10/2014  . Urinary frequency 11/10/2014  . Right carpal tunnel syndrome 11/10/2014  . Influenza-like symptoms 02/03/2014  . Allergic rhinitis 09/14/2013  . ADD (attention deficit disorder) 03/10/2012  . Tobacco use 09/10/2011  . Visit for preventive health examination 09/10/2011  . MS (multiple sclerosis) ([place]) 09/04/2011    Carney Living PT DPT  12/05/2017, 10:59 AM  [facility] [address] [place], [place], [address] Phone: [phone]   Fax:  [phone]  Name: ZAYLA AGAR MRN: [id number] Date of Birth: 1961/01/13

## 2017-12-10 ENCOUNTER — Ambulatory Visit: Payer: 59 | Admitting: Physical Therapy

## 2017-12-10 ENCOUNTER — Encounter: Payer: Self-pay | Admitting: Physical Therapy

## 2017-12-10 DIAGNOSIS — R2689 Other abnormalities of gait and mobility: Secondary | ICD-10-CM

## 2017-12-10 DIAGNOSIS — M25562 Pain in left knee: Secondary | ICD-10-CM

## 2017-12-10 DIAGNOSIS — M25662 Stiffness of left knee, not elsewhere classified: Secondary | ICD-10-CM

## 2017-12-11 NOTE — Therapy (Signed)
[place], [place], [address] Phone: [phone]   Fax:  [phone]  Physical Therapy Treatment  Patient Details  Name: [name] MRN: [id number] Date of Birth: [birth year] Referring Provider: Dr Frankey Shown    Encounter Date: 12/10/2017  PT End of Session - 12/10/17 1514    Visit Number  3    Number of Visits  12    Date for PT Re-Evaluation  01/15/18    Authorization Type  [facility]     PT Start Time  1505    PT Stop Time  1600    PT Time Calculation (min)  55 min    Activity Tolerance  Patient tolerated treatment well    Behavior During Therapy  [facility] for tasks assessed/performed       Past Medical History:  Diagnosis Date  . ADD (attention deficit disorder with hyperactivity)   . Anxiety   . Arthritis   . Cancer ([place])    Skin cancer- basal cell, squamous cell  . Cervical dysplasia   . GERD (gastroesophageal reflux disease)   . Headache    migraine  . Hepatitis    Pt states Core and surface antibioties for Hepatitis whens he was 26  . Mitral valve prolapse   . Multiple sclerosis ([place])   . Multiple sclerosis ([place])   . Tendonitis in rigtht wrist     Past Surgical History:  Procedure Laterality Date  . ABDOMINAL HYSTERECTOMY  2004   ovaries retained  . BACK SURGERY    . heart ablation     15 yrs ago for a fib by Dr.Klein, everything ok  . KNEE ARTHROSCOPY WITH SUBCHONDROPLASTY Left 11/13/2017   Procedure: LEFT KNEE ARTHROSCOPY WITH SUBCHONDROPLASTY, PARTIAL MEDIAL MENISCECTOMY, SYNOVECTOMY;  Surgeon: Leandrew Koyanagi, MD;  Location: [place];  Service: Orthopedics;  Laterality: Left;  . TUBAL LIGATION      There were no vitals filed for this visit.  Subjective Assessment - 12/10/17 1512    Subjective  Patient reports over the past few days she has had medial pain and some deep knee pain. Her pain is the worst at night. she is having difficulty getting comfrotbale.     Pertinent History  Multiple Sclerosis, Stress fracture of the tibia     Limitations  Standing;Walking    How long can you stand comfortably?  10-15 minutes    How long can you walk comfortably?  can walk community distances but has pain     Diagnostic tests  nothing post-op     Patient Stated Goals  get back to work    Currently in Pain?  Yes    Pain Score  3     Pain Location  Knee    Pain Orientation  Left    Pain Descriptors / Indicators  Aching    Pain Type  Surgical pain    Pain Onset  More than a month ago    Pain Frequency  Constant    Aggravating Factors   standing and walking     Pain Relieving Factors  rest, ice,     Effect of Pain on Daily Activities  unable to work                        [facility] Adult PT Treatment/Exercise - 12/11/17 0001      Knee/Hip Exercises: Stretches   Active Hamstring Stretch Limitations  3x20 sec hold with  strap     Other Knee/Hip Stretches  standing gastroc stretch 2x20 sec hold       Knee/Hip Exercises: Standing   Heel Raises Limitations  2x10    Other Standing Knee Exercises  standing march 2x10     Other Standing Knee Exercises  hip abduction 2x10 left hip extension 2x10 left       Knee/Hip Exercises: Seated   Long Arc Quad  2 sets;10 reps      Knee/Hip Exercises: Supine   Short Arc Quad Sets Limitations  3x10 left       Modalities   Modalities  Vasopneumatic      Vasopneumatic   Number Minutes Vasopneumatic   15 minutes    Vasopnuematic Location   Knee    Vasopneumatic Pressure  Low    Vasopneumatic Temperature   45F      Manual Therapy   Manual Therapy  Passive ROM    Manual therapy comments  PA and AP mobbilizations o improve extension     Passive ROM  stretch into extension              PT Education - 12/10/17 1514    Education Details  reviewed HEP and stretching     Person(s) Educated  Patient    Methods  Demonstration;Tactile cues;Explanation;Verbal cues;Handout    Comprehension  Verbalized  understanding;Verbal cues required;Tactile cues required;Returned demonstration       PT Short Term Goals - 12/11/17 1357      PT SHORT TERM GOAL #1   Title  Patient will be independent with basic HEP     Time  4    Period  Weeks    Status  On-going      PT SHORT TERM GOAL #2   Title  Patient will demsotrate full pain free passive knee extension and flexion     Baseline  pain with extension     Time  4    Period  Weeks    Status  On-going      PT SHORT TERM GOAL #3   Title  Patient will increase left single leg stance to 15 sec     Time  4    Period  Weeks    Status  On-going      PT SHORT TERM GOAL #4   Title  Patient will increase gorss left leg strength to 5/5     Baseline  working on strengthening     Time  4    Period  Weeks    Status  New        PT Long Term Goals - 12/03/17 1203      PT LONG TERM GOAL #1   Title  Patient will stand for 2 hours without self reported increase in painand swelling in order to return to work     Time  8    Period  Weeks    Status  New    Target Date  01/28/18      PT LONG TERM GOAL #2   Title  Patient will go up/down 14 steps without pain in order to get to her bedroom     Time  8    Period  Weeks    Status  New    Target Date  01/29/18      PT LONG TERM GOAL #3   Title  Patient will demonstrate a 41 % limitation on FOTO     Time  8  Period  Weeks    Status  New    Target Date  01/29/18            Plan - 12/11/17 1350    Clinical Impression Statement  Patient had improved extension per visual inspection today. She had a little more pain in the medial and posterior aspect of her knee today with activity. She tolerated ther-ex well. Therapy will continue to progress as tolerated. Therapy also added vaso pneumatic device to control pain and swelling.     Clinical Presentation  Stable    Clinical Decision Making  Low    Rehab Potential  Good    PT Frequency  2x / week    PT Duration  6 weeks    PT  Treatment/Interventions  ADLs/Self Care Home Management;Cryotherapy;Electrical Stimulation;Iontophoresis 4mg /ml Dexamethasone;Moist Heat;Ultrasound;Gait training;Stair training;Therapeutic activities;Therapeutic exercise;Neuromuscular re-education;Patient/family education;Manual techniques;Passive range of motion;Taping    PT Next Visit Plan  continue with PROM with focus on extension; add bike; add bridge; add light leg press; add standing march add SAQ     PT Home Exercise Plan  quad sets; SLR; knee extension stretch; supine ball squeeze     Consulted and Agree with Plan of Care  Patient       Patient will benefit from skilled therapeutic intervention in order to improve the following deficits and impairments:  Abnormal gait, Pain, Decreased endurance, Decreased activity tolerance, Decreased range of motion, Decreased strength, Difficulty walking  Visit Diagnosis: Acute pain of left knee  Stiffness of left knee, not elsewhere classified  Other abnormalities of gait and mobility     Problem List Patient Active Problem List   Diagnosis Date Noted  . 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 Acute medial meniscus tear 11/05/2017  . Synovitis of left knee 11/05/2017  . Insufficiency fracture of tibia 11/05/2017  . Effusion, left knee 10/23/2017  . Left knee pain 10/20/2017  . Right leg weakness 12/19/2016  . Acute upper respiratory infection 03/13/2016  . Cervical radiculitis 11/29/2015  . Numbness 11/29/2015  . Squamous cell carcinoma 05/25/2015  . Neck pain on right side 05/03/2015  . 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 Ataxic gait 11/10/2014  . High risk medication use 11/10/2014  . Urinary frequency 11/10/2014  . Right carpal tunnel syndrome 11/10/2014  . Influenza-like symptoms 02/03/2014  . Allergic rhinitis 09/14/2013  . ADD (attention deficit disorder) 03/10/2012  . Tobacco use 09/10/2011  . Visit for preventive health examination 09/10/2011  . MS (multiple sclerosis) ([place])  09/04/2011    Carney Living  PT DPT  12/11/2017, 2:07 PM  [facility] [address] [place], [place], [address] Phone: [phone]   Fax:  [phone]  Name: [name] MRN: [id number] Date of Birth: [birth year]

## 2017-12-12 ENCOUNTER — Ambulatory Visit: Payer: 59 | Admitting: Physical Therapy

## 2017-12-12 DIAGNOSIS — M25662 Stiffness of left knee, not elsewhere classified: Secondary | ICD-10-CM | POA: Diagnosis not present

## 2017-12-12 DIAGNOSIS — M25562 Pain in left knee: Secondary | ICD-10-CM

## 2017-12-12 DIAGNOSIS — R2689 Other abnormalities of gait and mobility: Secondary | ICD-10-CM | POA: Diagnosis not present

## 2017-12-13 NOTE — Therapy (Signed)
[place], [place], [address] Phone: [phone]   Fax:  [phone]  Physical Therapy Treatment  Patient Details  Name: [name] MRN: [id number] Date of Birth: [birth year] Referring Provider: Dr Frankey Shown    Encounter Date: 12/12/2017  PT End of Session - 12/13/17 0824    Visit Number  4    Number of Visits  12    Date for PT Re-Evaluation  01/15/18    Authorization Type  [facility]     PT Start Time  1500    PT Stop Time  1550    PT Time Calculation (min)  50 min    Activity Tolerance  Patient tolerated treatment well    Behavior During Therapy  [facility] for tasks assessed/performed       Past Medical History:  Diagnosis Date  . ADD (attention deficit disorder with hyperactivity)   . Anxiety   . Arthritis   . Cancer ([place])    Skin cancer- basal cell, squamous cell  . Cervical dysplasia   . GERD (gastroesophageal reflux disease)   . Headache    migraine  . Hepatitis    Pt states Core and surface antibioties for Hepatitis whens he was 66  . Mitral valve prolapse   . Multiple sclerosis ([place])   . Multiple sclerosis ([place])   . Tendonitis in rigtht wrist     Past Surgical History:  Procedure Laterality Date  . ABDOMINAL HYSTERECTOMY  2004   ovaries retained  . BACK SURGERY    . heart ablation     15 yrs ago for a fib by Dr.Klein, everything ok  . KNEE ARTHROSCOPY WITH SUBCHONDROPLASTY Left 11/13/2017   Procedure: LEFT KNEE ARTHROSCOPY WITH SUBCHONDROPLASTY, PARTIAL MEDIAL MENISCECTOMY, SYNOVECTOMY;  Surgeon: Leandrew Koyanagi, MD;  Location: [place];  Service: Orthopedics;  Laterality: Left;  . TUBAL LIGATION      There were no vitals filed for this visit.                    [facility] Adult PT Treatment/Exercise - 12/13/17 0001      Knee/Hip Exercises: Stretches   Active Hamstring Stretch Limitations  3x20 sec hold with strap     Other Knee/Hip Stretches  standing  gastroc stretch 2x20 sec hold       Knee/Hip Exercises: Machines for Strengthening   Cybex Leg Press  2x10 40 lbs       Knee/Hip Exercises: Standing   Heel Raises Limitations  2x10    Hip Flexion  2 sets;10 reps;Limitations    Hip Flexion Limitations  standing march     Forward Step Up  Step Height: 4";2 sets;10 reps      Knee/Hip Exercises: Supine   Quad Sets Limitations  2x10 5 sec hold    Short Arc Quad Sets Limitations  3x10 2lb     Bridges  10 reps    Straight Leg Raises Limitations  2x10       Modalities   Modalities  Cryotherapy      Cryotherapy   Number Minutes Cryotherapy  10 Minutes    Cryotherapy Location  Knee    Type of Cryotherapy  Ice pack      Manual Therapy   Manual Therapy  Passive ROM    Manual therapy comments  PA and AP mobbilizations o improve extension     Passive ROM  stretch into extension  PT Education - 12/13/17 0825    Education Details  reviewed POC     Person(s) Educated  Patient    Methods  Explanation;Demonstration;Tactile cues;Verbal cues;Handout    Comprehension  Verbalized understanding;Returned demonstration;Verbal cues required       PT Short Term Goals - 12/11/17 1357      PT SHORT TERM GOAL #1   Title  Patient will be independent with basic HEP     Time  4    Period  Weeks    Status  On-going      PT SHORT TERM GOAL #2   Title  Patient will demsotrate full pain free passive knee extension and flexion     Baseline  pain with extension     Time  4    Period  Weeks    Status  On-going      PT SHORT TERM GOAL #3   Title  Patient will increase left single leg stance to 15 sec     Time  4    Period  Weeks    Status  On-going      PT SHORT TERM GOAL #4   Title  Patient will increase gorss left leg strength to 5/5     Baseline  working on strengthening     Time  4    Period  Weeks    Status  New        PT Long Term Goals - 12/03/17 1203      PT LONG TERM GOAL #1   Title  Patient will stand for 2  hours without self reported increase in painand swelling in order to return to work     Time  8    Period  Weeks    Status  New    Target Date  01/28/18      PT LONG TERM GOAL #2   Title  Patient will go up/down 14 steps without pain in order to get to her bedroom     Time  8    Period  Weeks    Status  New    Target Date  01/29/18      PT LONG TERM GOAL #3   Title  Patient will demonstrate a 41 % limitation on FOTO     Time  8    Period  Weeks    Status  New    Target Date  01/29/18            Plan - 12/13/17 0825    Clinical Impression Statement  full extension noted today with minor pain at end range. Added step ups and leg press. Only monor increase in pain. Therapy will continue to progress per tolerance.     Clinical Presentation  Stable    Clinical Decision Making  Low    Rehab Potential  Good    PT Frequency  2x / week    PT Duration  6 weeks    PT Treatment/Interventions  ADLs/Self Care Home Management;Cryotherapy;Electrical Stimulation;Iontophoresis 4mg /ml Dexamethasone;Moist Heat;Ultrasound;Gait training;Stair training;Therapeutic activities;Therapeutic exercise;Neuromuscular re-education;Patient/family education;Manual techniques;Passive range of motion;Taping    PT Next Visit Plan  continue with PROM with focus on extension; add bike; add bridge; add light leg press; add standing march add SAQ     PT Home Exercise Plan  quad sets; SLR; knee extension stretch; supine ball squeeze     Consulted and Agree with Plan of Care  Patient       Patient will benefit from  skilled therapeutic intervention in order to improve the following deficits and impairments:  Abnormal gait, Pain, Decreased endurance, Decreased activity tolerance, Decreased range of motion, Decreased strength, Difficulty walking  Visit Diagnosis: Acute pain of left knee  Stiffness of left knee, not elsewhere classified  Other abnormalities of gait and mobility     Problem List Patient Active  Problem List   Diagnosis Date Noted  . 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 Acute medial meniscus tear 11/05/2017  . Synovitis of left knee 11/05/2017  . Insufficiency fracture of tibia 11/05/2017  . Effusion, left knee 10/23/2017  . Left knee pain 10/20/2017  . Right leg weakness 12/19/2016  . Acute upper respiratory infection 03/13/2016  . Cervical radiculitis 11/29/2015  . Numbness 11/29/2015  . Squamous cell carcinoma 05/25/2015  . Neck pain on right side 05/03/2015  . 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 Ataxic gait 11/10/2014  . High risk medication use 11/10/2014  . Urinary frequency 11/10/2014  . Right carpal tunnel syndrome 11/10/2014  . Influenza-like symptoms 02/03/2014  . Allergic rhinitis 09/14/2013  . ADD (attention deficit disorder) 03/10/2012  . Tobacco use 09/10/2011  . Visit for preventive health examination 09/10/2011  . MS (multiple sclerosis) ([place]) 09/04/2011    Carney Living  PT DPT  12/13/2017, 8:31 AM  [facility] [address] [place], [place], [address] Phone: [phone]   Fax:  [phone]  Name: [name] MRN: [id number] Date of Birth: Dec 06, 1960

## 2017-12-18 ENCOUNTER — Ambulatory Visit: Payer: 59 | Admitting: Physical Therapy

## 2017-12-18 ENCOUNTER — Encounter: Payer: Self-pay | Admitting: Physical Therapy

## 2017-12-18 DIAGNOSIS — R2689 Other abnormalities of gait and mobility: Secondary | ICD-10-CM

## 2017-12-18 DIAGNOSIS — M25662 Stiffness of left knee, not elsewhere classified: Secondary | ICD-10-CM

## 2017-12-18 DIAGNOSIS — M25562 Pain in left knee: Secondary | ICD-10-CM

## 2017-12-18 NOTE — Therapy (Signed)
[place], [place], [address] Phone: [phone]   Fax:  [phone]  Physical Therapy Treatment  Patient Details  Name: [name] MRN: [id number] Date of Birth: 1961-05-07 Referring Provider: Dr Frankey Shown    Encounter Date: 12/18/2017  PT End of Session - 12/18/17 1357    Visit Number  5    Number of Visits  12    Date for PT Re-Evaluation  01/15/18    Authorization Type  [facility]     PT Start Time  1330    PT Stop Time  1410    PT Time Calculation (min)  40 min    Activity Tolerance  Patient tolerated treatment well    Behavior During Therapy  [facility] for tasks assessed/performed       Past Medical History:  Diagnosis Date  . ADD (attention deficit disorder with hyperactivity)   . Anxiety   . Arthritis   . Cancer ([place])    Skin cancer- basal cell, squamous cell  . Cervical dysplasia   . GERD (gastroesophageal reflux disease)   . Headache    migraine  . Hepatitis    Pt states Core and surface antibioties for Hepatitis whens he was 31  . Mitral valve prolapse   . Multiple sclerosis ([place])   . Multiple sclerosis ([place])   . Tendonitis in rigtht wrist     Past Surgical History:  Procedure Laterality Date  . ABDOMINAL HYSTERECTOMY  2004   ovaries retained  . BACK SURGERY    . heart ablation     15 yrs ago for a fib by Dr.Klein, everything ok  . KNEE ARTHROSCOPY WITH SUBCHONDROPLASTY Left 11/13/2017   Procedure: LEFT KNEE ARTHROSCOPY WITH SUBCHONDROPLASTY, PARTIAL MEDIAL MENISCECTOMY, SYNOVECTOMY;  Surgeon: Leandrew Koyanagi, MD;  Location: [place];  Service: Orthopedics;  Laterality: Left;  . TUBAL LIGATION      There were no vitals filed for this visit.  Subjective Assessment - 12/18/17 1334    Subjective  Patient reports her knee is doing well but her back is hurting her. She slept on her stomach last night and feels like that made her back hurt.     Pertinent History   Multiple Sclerosis, Stress fracture of the tibia     Limitations  Standing;Walking    How long can you stand comfortably?  10-15 minutes    How long can you walk comfortably?  can walk community distances but has pain     Diagnostic tests  nothing post-op     Patient Stated Goals  get back to work    Currently in Pain?  Yes    Pain Score  5     Pain Location  Knee    Pain Orientation  Left    Pain Descriptors / Indicators  Aching    Pain Type  Surgical pain    Pain Radiating Towards  nothing at this time     Pain Onset  More than a month ago    Pain Frequency  Constant    Aggravating Factors   standing nad walking     Pain Relieving Factors  rest, ice     Effect of Pain on Daily Activities  unable to work                        [facility] Adult PT Treatment/Exercise - 12/18/17 0001      Knee/Hip Exercises: Stretches  Active Hamstring Stretch Limitations  3x20 sec hold with strap     Piriformis Stretch  3 reps;20 seconds;Right    Other Knee/Hip Stretches  sinlge knee to chest stretch 3x20 sec bilateral; lateral trunk rotation 3x20       Knee/Hip Exercises: Machines for Strengthening   Cybex Leg Press  2x10 40 lbs       Knee/Hip Exercises: Standing   Heel Raises Limitations  2x10    Hip Flexion  2 sets;10 reps;Limitations    Hip Flexion Limitations  standing march     Forward Step Up  Step Height: 4";2 sets;10 reps      Knee/Hip Exercises: Supine   Quad Sets Limitations  x20 5 sec hold     Short Arc Quad Sets Limitations  3x10 2lb     Straight Leg Raises Limitations  2x10     Other Supine Knee/Hip Exercises  supine clamshell x20              PT Education - 12/18/17 1357    Education Details  reviewed exercises and light back stetching    Person(s) Educated  Patient    Methods  Explanation;Demonstration;Tactile cues;Verbal cues    Comprehension  Verbalized understanding;Returned demonstration;Tactile cues required;Verbal cues required       PT Short  Term Goals - 12/11/17 1357      PT SHORT TERM GOAL #1   Title  Patient will be independent with basic HEP     Time  4    Period  Weeks    Status  On-going      PT SHORT TERM GOAL #2   Title  Patient will demsotrate full pain free passive knee extension and flexion     Baseline  pain with extension     Time  4    Period  Weeks    Status  On-going      PT SHORT TERM GOAL #3   Title  Patient will increase left single leg stance to 15 sec     Time  4    Period  Weeks    Status  On-going      PT SHORT TERM GOAL #4   Title  Patient will increase gorss left leg strength to 5/5     Baseline  working on strengthening     Time  4    Period  Weeks    Status  New        PT Long Term Goals - 12/03/17 1203      PT LONG TERM GOAL #1   Title  Patient will stand for 2 hours without self reported increase in painand swelling in order to return to work     Time  8    Period  Weeks    Status  New    Target Date  01/28/18      PT LONG TERM GOAL #2   Title  Patient will go up/down 14 steps without pain in order to get to her bedroom     Time  8    Period  Weeks    Status  New    Target Date  01/29/18      PT LONG TERM GOAL #3   Title  Patient will demonstrate a 41 % limitation on FOTO     Time  8    Period  Weeks    Status  New    Target Date  01/29/18  Plan - 12/18/17 1358    Clinical Impression Statement  Despite back pain the patient tolerated treatment well. Therapy reviewed light stretches so she could reduce her pain and continue with her knee therapy. The knee is progressing well. she only feels minor medial pain from time to time.      Clinical Presentation  Stable    Clinical Decision Making  Low    Rehab Potential  Good    PT Frequency  2x / week    PT Duration  6 weeks    PT Treatment/Interventions  ADLs/Self Care Home Management;Cryotherapy;Electrical Stimulation;Iontophoresis 4mg /ml Dexamethasone;Moist Heat;Ultrasound;Gait training;Stair  training;Therapeutic activities;Therapeutic exercise;Neuromuscular re-education;Patient/family education;Manual techniques;Passive range of motion;Taping    PT Next Visit Plan  continue with PROM with focus on extension; add bike; add bridge; add light leg press; add standing march add SAQ     PT Home Exercise Plan  quad sets; SLR; knee extension stretch; supine ball squeeze     Consulted and Agree with Plan of Care  Patient       Patient will benefit from skilled therapeutic intervention in order to improve the following deficits and impairments:  Abnormal gait, Pain, Decreased endurance, Decreased activity tolerance, Decreased range of motion, Decreased strength, Difficulty walking  Visit Diagnosis: Acute pain of left knee  Stiffness of left knee, not elsewhere classified  Other abnormalities of gait and mobility     Problem List Patient Active Problem List   Diagnosis Date Noted  . 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 Acute medial meniscus tear 11/05/2017  . Synovitis of left knee 11/05/2017  . Insufficiency fracture of tibia 11/05/2017  . Effusion, left knee 10/23/2017  . Left knee pain 10/20/2017  . Right leg weakness 12/19/2016  . Acute upper respiratory infection 03/13/2016  . Cervical radiculitis 11/29/2015  . Numbness 11/29/2015  . Squamous cell carcinoma 05/25/2015  . Neck pain on right side 05/03/2015  . 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 Ataxic gait 11/10/2014  . High risk medication use 11/10/2014  . Urinary frequency 11/10/2014  . Right carpal tunnel syndrome 11/10/2014  . Influenza-like symptoms 02/03/2014  . Allergic rhinitis 09/14/2013  . ADD (attention deficit disorder) 03/10/2012  . Tobacco use 09/10/2011  . Visit for preventive health examination 09/10/2011  . MS (multiple sclerosis) ([place]) 09/04/2011    Carney Living PT DPT  12/18/2017, 4:58 PM  [facility] [address] [place], [place],  [address] Phone: [phone]   Fax:  [phone]  Name: CALA KRUCKENBERG MRN: [id number] Date of Birth: 1961-04-03

## 2017-12-20 ENCOUNTER — Ambulatory Visit: Payer: 59 | Attending: Orthopaedic Surgery | Admitting: Physical Therapy

## 2017-12-20 ENCOUNTER — Encounter: Payer: Self-pay | Admitting: Physical Therapy

## 2017-12-20 DIAGNOSIS — R2689 Other abnormalities of gait and mobility: Secondary | ICD-10-CM | POA: Diagnosis not present

## 2017-12-20 DIAGNOSIS — M25662 Stiffness of left knee, not elsewhere classified: Secondary | ICD-10-CM | POA: Insufficient documentation

## 2017-12-20 DIAGNOSIS — M25562 Pain in left knee: Secondary | ICD-10-CM | POA: Diagnosis not present

## 2017-12-20 NOTE — Therapy (Signed)
[place], [place], [address] Phone: [phone]   Fax:  [phone]  Physical Therapy Treatment  Patient Details  Name: [name] MRN: [id number] Date of Birth: Oct 04, 1960 Referring Provider: Dr Frankey Shown    Encounter Date: 12/20/2017  PT End of Session - 12/20/17 0951    Visit Number  6    Number of Visits  12    Date for PT Re-Evaluation  01/15/18    Authorization Type  [facility] [employer]     PT Start Time  0845    PT Stop Time  0927    PT Time Calculation (min)  42 min    Activity Tolerance  Patient tolerated treatment well    Behavior During Therapy  [facility] for tasks assessed/performed       Past Medical History:  Diagnosis Date  . ADD (attention deficit disorder with hyperactivity)   . Anxiety   . Arthritis   . Cancer ([place])    Skin cancer- basal cell, squamous cell  . Cervical dysplasia   . GERD (gastroesophageal reflux disease)   . Headache    migraine  . Hepatitis    Pt states Core and surface antibioties for Hepatitis whens he was 5  . Mitral valve prolapse   . Multiple sclerosis ([place])   . Multiple sclerosis ([place])   . Tendonitis in rigtht wrist     Past Surgical History:  Procedure Laterality Date  . ABDOMINAL HYSTERECTOMY  2004   ovaries retained  . BACK SURGERY    . heart ablation     15 yrs ago for a fib by Dr.Klein, everything ok  . KNEE ARTHROSCOPY WITH SUBCHONDROPLASTY Left 11/13/2017   Procedure: LEFT KNEE ARTHROSCOPY WITH SUBCHONDROPLASTY, PARTIAL MEDIAL MENISCECTOMY, SYNOVECTOMY;  Surgeon: Leandrew Koyanagi, MD;  Location: [place];  Service: Orthopedics;  Laterality: Left;  . TUBAL LIGATION      There were no vitals filed for this visit.  Subjective Assessment - 12/20/17 0937    Subjective  Patient reports her back has improved. it is just a little stiff today. Her knee is doing well. She isnt having ay pain.     Pertinent History  Multiple Sclerosis, Stress  fracture of the tibia     Limitations  Standing;Walking    How long can you stand comfortably?  10-15 minutes    How long can you walk comfortably?  can walk community distances but has pain     Diagnostic tests  nothing post-op     Patient Stated Goals  get back to work    Currently in Pain?  No/denies    Pain Onset  More than a month ago    Pain Frequency  Constant    Aggravating Factors   standing and walking     Pain Relieving Factors  rest, ice     Effect of Pain on Daily Activities  unable to work     Multiple Pain Sites  No                       [facility] Adult PT Treatment/Exercise - 12/20/17 0001      Knee/Hip Exercises: Stretches   Active Hamstring Stretch Limitations  3x20 sec hold with strap       Knee/Hip Exercises: Machines for Strengthening   Cybex Leg Press  2x10 40 lbs       Knee/Hip Exercises: Standing   Heel Raises Limitations  2x10  Hip Flexion  2 sets;10 reps;Limitations    Hip Flexion Limitations  standing march     Forward Step Up  2 sets;10 reps;Step Height: 6"    SLS  3x10 sec hold with bilateral HHS     Other Standing Knee Exercises  step onto air-ex 2x10       Knee/Hip Exercises: Supine   Straight Leg Raises Limitations  2x10     Other Supine Knee/Hip Exercises  supine clamshell x20              PT Education - 12/20/17 0947    Education Details  reviewed technique with ther-ex     Person(s) Educated  Patient    Methods  Explanation;Demonstration;Tactile cues;Verbal cues    Comprehension  Verbalized understanding;Returned demonstration;Verbal cues required;Tactile cues required;Need further instruction       PT Short Term Goals - 12/11/17 1357      PT SHORT TERM GOAL #1   Title  Patient will be independent with basic HEP     Time  4    Period  Weeks    Status  On-going      PT SHORT TERM GOAL #2   Title  Patient will demsotrate full pain free passive knee extension and flexion     Baseline  pain with extension      Time  4    Period  Weeks    Status  On-going      PT SHORT TERM GOAL #3   Title  Patient will increase left single leg stance to 15 sec     Time  4    Period  Weeks    Status  On-going      PT SHORT TERM GOAL #4   Title  Patient will increase gorss left leg strength to 5/5     Baseline  working on strengthening     Time  4    Period  Weeks    Status  New        PT Long Term Goals - 12/03/17 1203      PT LONG TERM GOAL #1   Title  Patient will stand for 2 hours without self reported increase in painand swelling in order to return to work     Time  8    Period  Weeks    Status  New    Target Date  01/28/18      PT LONG TERM GOAL #2   Title  Patient will go up/down 14 steps without pain in order to get to her bedroom     Time  8    Period  Weeks    Status  New    Target Date  01/29/18      PT LONG TERM GOAL #3   Title  Patient will demonstrate a 41 % limitation on FOTO     Time  8    Period  Weeks    Status  New    Target Date  01/29/18            Plan - 12/20/17 0953    Clinical Impression Statement  Patient tolerated exercises well. She had no increase in pain in her knee. She tolerated standing exercises well. Therapy added step onto ir-ex and signle leg stance. She is making great progress.     Clinical Presentation  Stable    Clinical Decision Making  Low    Rehab Potential  Good    PT Frequency  2x / week    PT Duration  6 weeks    PT Treatment/Interventions  ADLs/Self Care Home Management;Cryotherapy;Electrical Stimulation;Iontophoresis 4mg /ml Dexamethasone;Moist Heat;Ultrasound;Gait training;Stair training;Therapeutic activities;Therapeutic exercise;Neuromuscular re-education;Patient/family education;Manual techniques;Passive range of motion;Taping    PT Next Visit Plan  continue with PROM with focus on extension; add bike; add bridge; add light leg press; add standing march add SAQ     PT Home Exercise Plan  quad sets; SLR; knee extension stretch;  supine ball squeeze     Consulted and Agree with Plan of Care  Patient       Patient will benefit from skilled therapeutic intervention in order to improve the following deficits and impairments:  Abnormal gait, Pain, Decreased endurance, Decreased activity tolerance, Decreased range of motion, Decreased strength, Difficulty walking  Visit Diagnosis: Acute pain of left knee  Stiffness of left knee, not elsewhere classified  Other abnormalities of gait and mobility     Problem List Patient Active Problem List   Diagnosis Date Noted  . 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 Acute medial meniscus tear 11/05/2017  . Synovitis of left knee 11/05/2017  . Insufficiency fracture of tibia 11/05/2017  . Effusion, left knee 10/23/2017  . Left knee pain 10/20/2017  . Right leg weakness 12/19/2016  . Acute upper respiratory infection 03/13/2016  . Cervical radiculitis 11/29/2015  . Numbness 11/29/2015  . Squamous cell carcinoma 05/25/2015  . Neck pain on right side 05/03/2015  . 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 Ataxic gait 11/10/2014  . High risk medication use 11/10/2014  . Urinary frequency 11/10/2014  . Right carpal tunnel syndrome 11/10/2014  . Influenza-like symptoms 02/03/2014  . Allergic rhinitis 09/14/2013  . ADD (attention deficit disorder) 03/10/2012  . Tobacco use 09/10/2011  . Visit for preventive health examination 09/10/2011  . MS (multiple sclerosis) ([place]) 09/04/2011    Carney Living  PT DPT  12/20/2017, 2:54 PM  [facility] [address] [place], [place], [address] Phone: [phone]   Fax:  [phone]  Name: [name] MRN: [id number] Date of Birth: 1960-07-18

## 2017-12-23 ENCOUNTER — Ambulatory Visit: Payer: 59 | Admitting: Physical Therapy

## 2017-12-23 ENCOUNTER — Encounter: Payer: Self-pay | Admitting: Physical Therapy

## 2017-12-23 DIAGNOSIS — R2689 Other abnormalities of gait and mobility: Secondary | ICD-10-CM

## 2017-12-23 DIAGNOSIS — M25562 Pain in left knee: Secondary | ICD-10-CM | POA: Diagnosis not present

## 2017-12-23 DIAGNOSIS — M25662 Stiffness of left knee, not elsewhere classified: Secondary | ICD-10-CM

## 2017-12-23 NOTE — Therapy (Signed)
[place], [place], [address] Phone: [phone]   Fax:  [phone]  Physical Therapy Treatment  Patient Details  Name: [name] MRN: [id number] Date of Birth: 02/28/1961 Referring Provider: Dr Frankey Shown    Encounter Date: 12/23/2017  PT End of Session - 12/23/17 1012    Visit Number  7    Number of Visits  12    Date for PT Re-Evaluation  01/15/18    Authorization Type  [facility] [employer]     PT Start Time  0934    PT Stop Time  1017    PT Time Calculation (min)  43 min    Activity Tolerance  Patient tolerated treatment well    Behavior During Therapy  [facility] for tasks assessed/performed       Past Medical History:  Diagnosis Date  . ADD (attention deficit disorder with hyperactivity)   . Anxiety   . Arthritis   . Cancer ([place])    Skin cancer- basal cell, squamous cell  . Cervical dysplasia   . GERD (gastroesophageal reflux disease)   . Headache    migraine  . Hepatitis    Pt states Core and surface antibioties for Hepatitis whens he was 21  . Mitral valve prolapse   . Multiple sclerosis ([place])   . Multiple sclerosis ([place])   . Tendonitis in rigtht wrist     Past Surgical History:  Procedure Laterality Date  . ABDOMINAL HYSTERECTOMY  2004   ovaries retained  . BACK SURGERY    . heart ablation     15 yrs ago for a fib by Dr.Klein, everything ok  . KNEE ARTHROSCOPY WITH SUBCHONDROPLASTY Left 11/13/2017   Procedure: LEFT KNEE ARTHROSCOPY WITH SUBCHONDROPLASTY, PARTIAL MEDIAL MENISCECTOMY, SYNOVECTOMY;  Surgeon: Leandrew Koyanagi, MD;  Location: [facility];  Service: Orthopedics;  Laterality: Left;  . TUBAL LIGATION      There were no vitals filed for this visit.  Subjective Assessment - 12/23/17 1009    Subjective  Patient reports a little bit of swelling in the posterior knee today. She felt good after the last session. she was able to do some walking over the weekend.     Pertinent  History  Multiple Sclerosis, Stress fracture of the tibia     Limitations  Standing;Walking    How long can you stand comfortably?  10-15 minutes    How long can you walk comfortably?  can walk community distances but has pain     Diagnostic tests  nothing post-op     Patient Stated Goals  get back to work    Currently in Pain?  No/denies                       [facility] Adult PT Treatment/Exercise - 12/23/17 0001      Knee/Hip Exercises: Stretches   Active Hamstring Stretch Limitations  3x20 sec hold with strap     Piriformis Stretch  3 reps;20 seconds;Right    Other Knee/Hip Stretches  sinlge knee to chest stretch 3x20 sec bilateral; lateral trunk rotation 3x20       Knee/Hip Exercises: Machines for Strengthening   Cybex Leg Press  3x10 40 lbs       Knee/Hip Exercises: Standing   Heel Raises Limitations  2x10    Hip Flexion  2 sets;10 reps;Limitations    Hip Flexion Limitations  standing march     Forward Step Up  2 sets;10 reps;Step Height: 8"    SLS  3x10 sec hold with bilateral HHS     Other Standing Knee Exercises  squat 2x10       Knee/Hip Exercises: Seated   Long Arc Quad  2 sets;10 reps      Knee/Hip Exercises: Supine   Short Arc Quad Sets Limitations  3x10 2lb     Straight Leg Raises Limitations  2x10 2lbs     Other Supine Knee/Hip Exercises  supine clamshell x20 blue       Manual Therapy   Passive ROM  gentle extension stretch              PT Education - 12/23/17 1012    Education Details  reviewed symptom managment     Person(s) Educated  Patient    Methods  Explanation;Demonstration;Tactile cues;Verbal cues    Comprehension  Verbalized understanding;Returned demonstration;Tactile cues required;Verbal cues required;Need further instruction       PT Short Term Goals - 12/11/17 1357      PT SHORT TERM GOAL #1   Title  Patient will be independent with basic HEP     Time  4    Period  Weeks    Status  On-going      PT SHORT TERM GOAL #2    Title  Patient will demsotrate full pain free passive knee extension and flexion     Baseline  pain with extension     Time  4    Period  Weeks    Status  On-going      PT SHORT TERM GOAL #3   Title  Patient will increase left single leg stance to 15 sec     Time  4    Period  Weeks    Status  On-going      PT SHORT TERM GOAL #4   Title  Patient will increase gorss left leg strength to 5/5     Baseline  working on strengthening     Time  4    Period  Weeks    Status  New        PT Long Term Goals - 12/03/17 1203      PT LONG TERM GOAL #1   Title  Patient will stand for 2 hours without self reported increase in painand swelling in order to return to work     Time  8    Period  Weeks    Status  New    Target Date  01/28/18      PT LONG TERM GOAL #2   Title  Patient will go up/down 14 steps without pain in order to get to her bedroom     Time  8    Period  Weeks    Status  New    Target Date  01/29/18      PT LONG TERM GOAL #3   Title  Patient will demonstrate a 41 % limitation on FOTO     Time  8    Period  Weeks    Status  New    Target Date  01/29/18            Plan - 12/23/17 1014    Clinical Impression Statement  Patient is making great progress. she has met her range of motion goals. She is progressing well towards her stair goal. She feels like she has some swelling from time to time but overall she is having very little swelling  and pain. Therapy will continue to progress the patient towards goal of return to work.     Clinical Presentation  Stable    Clinical Decision Making  Low    Rehab Potential  Good    PT Frequency  2x / week    PT Duration  6 weeks    PT Treatment/Interventions  ADLs/Self Care Home Management;Cryotherapy;Electrical Stimulation;Iontophoresis '4mg'$ /ml Dexamethasone;Moist Heat;Ultrasound;Gait training;Stair training;Therapeutic activities;Therapeutic exercise;Neuromuscular re-education;Patient/family education;Manual  techniques;Passive range of motion;Taping    PT Next Visit Plan  continue with PROM with focus on extension; add bike; add bridge; add light leg press; add standing march add SAQ     PT Home Exercise Plan  quad sets; SLR; knee extension stretch; supine ball squeeze     Consulted and Agree with Plan of Care  Patient       Patient will benefit from skilled therapeutic intervention in order to improve the following deficits and impairments:  Abnormal gait, Pain, Decreased endurance, Decreased activity tolerance, Decreased range of motion, Decreased strength, Difficulty walking  Visit Diagnosis: Acute pain of left knee  Stiffness of left knee, not elsewhere classified  Other abnormalities of gait and mobility     Problem List Patient Active Problem List   Diagnosis Date Noted  . 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 Acute medial meniscus tear 11/05/2017  . Synovitis of left knee 11/05/2017  . Insufficiency fracture of tibia 11/05/2017  . Effusion, left knee 10/23/2017  . Left knee pain 10/20/2017  . Right leg weakness 12/19/2016  . Acute upper respiratory infection 03/13/2016  . Cervical radiculitis 11/29/2015  . Numbness 11/29/2015  . Squamous cell carcinoma 05/25/2015  . Neck pain on right side 05/03/2015  . 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 Ataxic gait 11/10/2014  . High risk medication use 11/10/2014  . Urinary frequency 11/10/2014  . Right carpal tunnel syndrome 11/10/2014  . Influenza-like symptoms 02/03/2014  . Allergic rhinitis 09/14/2013  . ADD (attention deficit disorder) 03/10/2012  . Tobacco use 09/10/2011  . Visit for preventive health examination 09/10/2011  . MS (multiple sclerosis) ([place]) 09/04/2011    Carney Living PT DPT  12/23/2017, 2:34 PM  [facility] [address] [place], [place], [address] Phone: [phone]   Fax:  [phone]  Name: [name] MRN: [id number] Date of Birth:  [birth year]

## 2017-12-26 ENCOUNTER — Encounter: Payer: Self-pay | Admitting: Physical Therapy

## 2017-12-26 ENCOUNTER — Ambulatory Visit: Payer: 59 | Admitting: Physical Therapy

## 2017-12-26 DIAGNOSIS — R2689 Other abnormalities of gait and mobility: Secondary | ICD-10-CM

## 2017-12-26 DIAGNOSIS — M25662 Stiffness of left knee, not elsewhere classified: Secondary | ICD-10-CM

## 2017-12-26 DIAGNOSIS — M25562 Pain in left knee: Secondary | ICD-10-CM | POA: Diagnosis not present

## 2017-12-26 NOTE — Therapy (Signed)
[place], [place], [address] Phone: [phone]   Fax:  [phone]  Physical Therapy Treatment  Patient Details  Name: [name] MRN: [id number] Date of Birth: 03/05/1961 Referring Provider: Dr Frankey Shown    Encounter Date: 12/26/2017  PT End of Session - 12/26/17 1155    Visit Number  8    Number of Visits  12    Date for PT Re-Evaluation  01/15/18    Authorization Type  [facility] [employer]     PT Start Time  0930    PT Stop Time  1010    PT Time Calculation (min)  40 min    Activity Tolerance  Patient tolerated treatment well    Behavior During Therapy  [facility] for tasks assessed/performed       Past Medical History:  Diagnosis Date  . ADD (attention deficit disorder with hyperactivity)   . Anxiety   . Arthritis   . Cancer ([place])    Skin cancer- basal cell, squamous cell  . Cervical dysplasia   . GERD (gastroesophageal reflux disease)   . Headache    migraine  . Hepatitis    Pt states Core and surface antibioties for Hepatitis whens he was 15  . Mitral valve prolapse   . Multiple sclerosis ([place])   . Multiple sclerosis ([place])   . Tendonitis in rigtht wrist     Past Surgical History:  Procedure Laterality Date  . ABDOMINAL HYSTERECTOMY  2004   ovaries retained  . BACK SURGERY    . heart ablation     15 yrs ago for a fib by Dr.Klein, everything ok  . KNEE ARTHROSCOPY WITH SUBCHONDROPLASTY Left 11/13/2017   Procedure: LEFT KNEE ARTHROSCOPY WITH SUBCHONDROPLASTY, PARTIAL MEDIAL MENISCECTOMY, SYNOVECTOMY;  Surgeon: Leandrew Koyanagi, MD;  Location: [place];  Service: Orthopedics;  Laterality: Left;  . TUBAL LIGATION      There were no vitals filed for this visit.  Subjective Assessment - 12/26/17 0935    Subjective  Patient reports she has been having some posterior knee pain over the past few days. She thinks she is pushing it too hard working on going down stairs. She has no pain this  morning but she has some tightness in the back of her knee.     Pertinent History  Multiple Sclerosis, Stress fracture of the tibia     Limitations  Standing;Walking    How long can you stand comfortably?  10-15 minutes    How long can you walk comfortably?  can walk community distances but has pain     Diagnostic tests  nothing post-op     Patient Stated Goals  get back to work    Currently in Pain?  No/denies                       [facility] Adult PT Treatment/Exercise - 12/26/17 0001      Knee/Hip Exercises: Stretches   Active Hamstring Stretch Limitations  3x20 sec hold with strap       Knee/Hip Exercises: Standing   Heel Raises Limitations  2x10    Hip Flexion  2 sets;10 reps;Limitations    Forward Step Up  2 sets;10 reps;Step Height: 8"      Knee/Hip Exercises: Supine   Short Arc Quad Sets Limitations  3x10 2lb     Straight Leg Raises Limitations  2x10 2lbs     Other Supine Knee/Hip Exercises  supine clamshell x20 blue       Manual Therapy   Passive ROM  gentle extension stretch              PT Education - 12/26/17 0936    Education Details  reviewed stretching for the posterior knee     Person(s) Educated  Patient    Methods  Explanation;Demonstration;Tactile cues;Verbal cues    Comprehension  Verbalized understanding;Returned demonstration;Verbal cues required;Tactile cues required;Need further instruction                 Plan - 12/26/17 0952    Clinical Impression Statement  Therapy worked on psoteriro knee stretching and edema control. She reported feeling looser after her visit. She tolerated ther-ex well. She is making good progress. She was advised if she feels like going down stairs is making her itrritated to be careful this weekend. She is making great progress. If she goes back to work she has a good program, she can [place] discharge to [employer].     PT Frequency  2x / week    PT Duration  6 weeks    PT Treatment/Interventions   ADLs/Self Care Home Management;Cryotherapy;Electrical Stimulation;Iontophoresis 4mg /ml Dexamethasone;Moist Heat;Ultrasound;Gait training;Stair training;Therapeutic activities;Therapeutic exercise;Neuromuscular re-education;Patient/family education;Manual techniques;Passive range of motion;Taping    PT Next Visit Plan  continue with PROM with focus on extension; add bike; add bridge; add light leg press; add standing march add SAQ     PT Home Exercise Plan  quad sets; SLR; knee extension stretch; supine ball squeeze     Consulted and Agree with Plan of Care  Patient       Patient will benefit from skilled therapeutic intervention in order to improve the following deficits and impairments:  Abnormal gait, Pain, Decreased endurance, Decreased activity tolerance, Decreased range of motion, Decreased strength, Difficulty walking  Visit Diagnosis: Acute pain of left knee  Stiffness of left knee, not elsewhere classified  Other abnormalities of gait and mobility     Problem List Patient Active Problem List   Diagnosis Date Noted  . 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 Acute medial meniscus tear 11/05/2017  . Synovitis of left knee 11/05/2017  . Insufficiency fracture of tibia 11/05/2017  . Effusion, left knee 10/23/2017  . Left knee pain 10/20/2017  . Right leg weakness 12/19/2016  . Acute upper respiratory infection 03/13/2016  . Cervical radiculitis 11/29/2015  . Numbness 11/29/2015  . Squamous cell carcinoma 05/25/2015  . Neck pain on right side 05/03/2015  . 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 Ataxic gait 11/10/2014  . High risk medication use 11/10/2014  . Urinary frequency 11/10/2014  . Right carpal tunnel syndrome 11/10/2014  . Influenza-like symptoms 02/03/2014  . Allergic rhinitis 09/14/2013  . ADD (attention deficit disorder) 03/10/2012  . Tobacco use 09/10/2011  . Visit for preventive health examination 09/10/2011  . MS (multiple sclerosis) ([place]) 09/04/2011    Carney Living 12/26/2017, 12:00 PM  [facility] [address] [place], [place], [address] Phone: [phone]   Fax:  [phone]  Name: [name] MRN: [id number] Date of Birth: Sep 19, 1960

## 2017-12-27 ENCOUNTER — Ambulatory Visit (INDEPENDENT_AMBULATORY_CARE_PROVIDER_SITE_OTHER): Payer: 59 | Admitting: Orthopaedic Surgery

## 2017-12-27 DIAGNOSIS — S83242A Other tear of medial meniscus, current injury, left knee, initial encounter: Secondary | ICD-10-CM

## 2017-12-27 DIAGNOSIS — M84362A Stress fracture, left tibia, initial encounter for fracture: Secondary | ICD-10-CM

## 2017-12-27 DIAGNOSIS — M659 Synovitis and tenosynovitis, unspecified: Secondary | ICD-10-CM

## 2017-12-27 NOTE — Progress Notes (Signed)
Post-Op Visit Note   Patient: [name]           Date of Birth: July 20, 1960           MRN: [id number] Visit Date: 12/27/2017 PCP: Patient, No Pcp Per Questions encouraged  Assessment & Plan:  Chief Complaint:  Chief Complaint  Patient presents with  . Left Knee - Pain, Routine Post Op, Follow-up   Visit Diagnoses:  1. Acute medial meniscus tear of left knee, initial encounter   2. Stress fracture of left tibia, initial encounter   3. Synovitis of left knee     Plan: [name] is 6 weeks status post left knee arthroscopy with sub-chondroplasty.  She is feeling much better.  She is very happy with her improvement and progress.  She has progressed with physical therapy very well and she has 4 more visits left which I would like her to complete.  At this point her surgical scars are fully healed.  She has returned back to baseline in terms of her knee range of motion.  She has no tenderness to palpation.  No swelling.  I would like her to go back to work part-time for the next week and then back to full-time after that.  She does have some mild medial sided knee pain which I think is attributable to her advanced chondromalacia of the medial compartment.  We will recheck her in 4 weeks.  If she is doing well we will likely release her.  Follow-Up Instructions: Return in about 4 weeks (around 01/24/2018).   Orders:  No orders of the defined types were placed in this encounter.  No orders of the defined types were placed in this encounter.   Imaging: No results found.  PMFS History: Patient Active Problem List   Diagnosis Date Noted  . 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 Acute medial meniscus tear 11/05/2017  . Synovitis of left knee 11/05/2017  . Insufficiency fracture of tibia 11/05/2017  . Effusion, left knee 10/23/2017  . Left knee pain 10/20/2017  . Right leg weakness 12/19/2016  . Acute upper respiratory infection 03/13/2016  . Cervical radiculitis 11/29/2015  .  Numbness 11/29/2015  . Squamous cell carcinoma 05/25/2015  . Neck pain on right side 05/03/2015  . 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 Ataxic gait 11/10/2014  . High risk medication use 11/10/2014  . Urinary frequency 11/10/2014  . Right carpal tunnel syndrome 11/10/2014  . Influenza-like symptoms 02/03/2014  . Allergic rhinitis 09/14/2013  . ADD (attention deficit disorder) 03/10/2012  . Tobacco use 09/10/2011  . Visit for preventive health examination 09/10/2011  . MS (multiple sclerosis) ([place]) 09/04/2011   Past Medical History:  Diagnosis Date  . ADD (attention deficit disorder with hyperactivity)   . Anxiety   . Arthritis   . Cancer ([place])    Skin cancer- basal cell, squamous cell  . Cervical dysplasia   . GERD (gastroesophageal reflux disease)   . Headache    migraine  . Hepatitis    Pt states Core and surface antibioties for Hepatitis whens he was 37  . Mitral valve prolapse   . Multiple sclerosis ([place])   . Multiple sclerosis ([place])   . Tendonitis in rigtht wrist     Family History  Problem Relation Age of Onset  . Depression Mother   . Stroke Mother   . Heart disease Mother   . Dementia Mother   . Cancer Neg Hx   . Diabetes Neg Hx  Past Surgical History:  Procedure Laterality Date  . ABDOMINAL HYSTERECTOMY  2004   ovaries retained  . BACK SURGERY    . heart ablation     15 yrs ago for a fib by Dr.Klein, everything ok  . KNEE ARTHROSCOPY WITH SUBCHONDROPLASTY Left 11/13/2017   Procedure: LEFT KNEE ARTHROSCOPY WITH SUBCHONDROPLASTY, PARTIAL MEDIAL MENISCECTOMY, SYNOVECTOMY;  Surgeon: Leandrew Koyanagi, MD;  Location: [facility];  Service: Orthopedics;  Laterality: Left;  . TUBAL LIGATION     Social History   Occupational History  . Not on file  Tobacco Use  . Smoking status: Current Some Day Smoker    Packs/day: 0.25    Types: Cigarettes  . Smokeless tobacco: Never Used  Substance and Sexual Activity  . Alcohol use: Yes    Alcohol/week: 0.0  standard drinks    Comment: Occasioanl glass of wine   . Drug use: No  . Sexual activity: Yes    Birth control/protection: Surgical

## 2017-12-30 ENCOUNTER — Ambulatory Visit: Payer: 59 | Admitting: Physical Therapy

## 2017-12-31 ENCOUNTER — Encounter: Payer: Self-pay | Admitting: Physical Therapy

## 2018-01-01 ENCOUNTER — Ambulatory Visit: Payer: 59 | Admitting: Physical Therapy

## 2018-01-02 ENCOUNTER — Ambulatory Visit: Payer: 59 | Admitting: Physical Therapy

## 2018-01-06 ENCOUNTER — Encounter: Payer: Self-pay | Admitting: Physical Therapy

## 2018-01-07 ENCOUNTER — Ambulatory Visit: Payer: 59 | Admitting: Physical Therapy

## 2018-01-07 ENCOUNTER — Encounter: Payer: Self-pay | Admitting: Physical Therapy

## 2018-01-07 DIAGNOSIS — M25662 Stiffness of left knee, not elsewhere classified: Secondary | ICD-10-CM | POA: Diagnosis not present

## 2018-01-07 DIAGNOSIS — R2689 Other abnormalities of gait and mobility: Secondary | ICD-10-CM | POA: Diagnosis not present

## 2018-01-07 DIAGNOSIS — M25562 Pain in left knee: Secondary | ICD-10-CM

## 2018-01-08 ENCOUNTER — Encounter: Payer: Self-pay | Admitting: Physical Therapy

## 2018-01-08 NOTE — Therapy (Signed)
[place], [place], [address] Phone: [phone]   Fax:  [phone]  Physical Therapy Treatment  Patient Details  Name: [name] MRN: [id number] Date of Birth: 1961-01-25 Referring Provider: Dr Frankey Shown    Encounter Date: 01/07/2018  PT End of Session - 01/07/18 1526    Visit Number  9    Number of Visits  12    Date for PT Re-Evaluation  01/15/18    Authorization Type  [facility] U[facility]     PT Start Time  1502    PT Stop Time  1541    PT Time Calculation (min)  39 min    Activity Tolerance  Patient tolerated treatment well    Behavior During Therapy  [facility] for tasks assessed/performed       Past Medical History:  Diagnosis Date  . ADD (attention deficit disorder with hyperactivity)   . Anxiety   . Arthritis   . Cancer ([place])    Skin cancer- basal cell, squamous cell  . Cervical dysplasia   . GERD (gastroesophageal reflux disease)   . Headache    migraine  . Hepatitis    Pt states Core and surface antibioties for Hepatitis whens he was 10  . Mitral valve prolapse   . Multiple sclerosis ([place])   . Multiple sclerosis ([place])   . Tendonitis in rigtht wrist     Past Surgical History:  Procedure Laterality Date  . ABDOMINAL HYSTERECTOMY  2004   ovaries retained  . BACK SURGERY    . heart ablation     15 yrs ago for a fib by Dr.Klein, everything ok  . KNEE ARTHROSCOPY WITH SUBCHONDROPLASTY Left 11/13/2017   Procedure: LEFT KNEE ARTHROSCOPY WITH SUBCHONDROPLASTY, PARTIAL MEDIAL MENISCECTOMY, SYNOVECTOMY;  Surgeon: Leandrew Koyanagi, MD;  Location: [facility];  Service: Orthopedics;  Laterality: Left;  . TUBAL LIGATION      There were no vitals filed for this visit.  Subjective Assessment - 01/07/18 1506    Subjective  Patient had her first full day of work yesterday. She was very sore in her posterior knee after that. She went back to the MD who wants her finish her 4 visits.     Pertinent History  Multiple Sclerosis, Stress fracture of the tibia     Limitations  Standing;Walking    How long can you stand comfortably?  10-15 minutes    How long can you walk comfortably?  can walk community distances but has pain     Diagnostic tests  nothing post-op     Patient Stated Goals  get back to work    Currently in Pain?  No/denies                       [facility] Adult PT Treatment/Exercise - 01/08/18 0001      Knee/Hip Exercises: Stretches   Active Hamstring Stretch Limitations  3x20 sec hold with strap       Knee/Hip Exercises: Supine   Short Arc Quad Sets Limitations  3x10 2lb     Straight Leg Raises Limitations  2x10 2lbs     Other Supine Knee/Hip Exercises  supine clamshell x20 blue       Manual Therapy   Passive ROM  edema massage to posterior knee with knee elevated; gentle knee extension stretch             PT Education - 01/07/18 1526    Education  Details  reviewed symptom mangement.     Person(s) Educated  Patient    Methods  Explanation;Demonstration;Tactile cues;Verbal cues    Comprehension  Verbalized understanding;Returned demonstration;Verbal cues required;Tactile cues required       PT Short Term Goals - 01/08/18 1255      PT SHORT TERM GOAL #1   Title  Patient will be independent with basic HEP     Time  4    Period  Weeks    Status  On-going      PT SHORT TERM GOAL #2   Title  Patient will demsotrate full pain free passive knee extension and flexion     Baseline  pain with extension     Time  4    Period  Weeks    Status  On-going      PT SHORT TERM GOAL #3   Title  Patient will increase left single leg stance to 15 sec     Time  4    Period  Weeks    Status  On-going      PT SHORT TERM GOAL #4   Title  Patient will increase gorss left leg strength to 5/5     Baseline  working on strengthening     Time  4    Period  Weeks    Status  On-going        PT Long Term Goals - 12/03/17 1203      PT LONG TERM  GOAL #1   Title  Patient will stand for 2 hours without self reported increase in painand swelling in order to return to work     Time  8    Period  Weeks    Status  New    [place] Date  01/28/18      PT LONG TERM GOAL #2   Title  Patient will go up/down 14 steps without pain in order to get to her bedroom     Time  8    Period  Weeks    Status  New    [place] Date  01/29/18      PT LONG TERM GOAL #3   Title  Patient will demonstrate a 41 % limitation on FOTO     Time  8    Period  Weeks    Status  New    Target Date  01/29/18            Plan - 01/07/18 1527    Clinical Impression Statement  Patient tolerated treatment well. Therapy focused on manual therapy to the back of the knee. She reported some tightness with SLR. Overall she tolerated treatmen well. She wa sadvised to ice and elevate her leg later.     Clinical Presentation  Stable    Clinical Decision Making  Low    Rehab Potential  Good    PT Frequency  2x / week    PT Duration  6 weeks    PT Treatment/Interventions  ADLs/Self Care Home Management;Cryotherapy;Electrical Stimulation;Iontophoresis 4mg /ml Dexamethasone;Moist Heat;Ultrasound;Gait training;Stair training;Therapeutic activities;Therapeutic exercise;Neuromuscular re-education;Patient/family education;Manual techniques;Passive range of motion;Taping    PT Next Visit Plan  continue with PROM with focus on extension; add bike; add bridge; add light leg press; add standing march add SAQ     PT Home Exercise Plan  quad sets; SLR; knee extension stretch; supine ball squeeze     Consulted and Agree with Plan of Care  Patient       Patient will  benefit from skilled therapeutic intervention in order to improve the following deficits and impairments:  Abnormal gait, Pain, Decreased endurance, Decreased activity tolerance, Decreased range of motion, Decreased strength, Difficulty walking  Visit Diagnosis: Acute pain of left knee  Stiffness of left knee, not  elsewhere classified  Other abnormalities of gait and mobility     Problem List Patient Active Problem List   Diagnosis Date Noted  . 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 Acute medial meniscus tear 11/05/2017  . Synovitis of left knee 11/05/2017  . Insufficiency fracture of tibia 11/05/2017  . Effusion, left knee 10/23/2017  . Left knee pain 10/20/2017  . Right leg weakness 12/19/2016  . Acute upper respiratory infection 03/13/2016  . Cervical radiculitis 11/29/2015  . Numbness 11/29/2015  . Squamous cell carcinoma 05/25/2015  . Neck pain on right side 05/03/2015  . 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 Ataxic gait 11/10/2014  . High risk medication use 11/10/2014  . Urinary frequency 11/10/2014  . Right carpal tunnel syndrome 11/10/2014  . Influenza-like symptoms 02/03/2014  . Allergic rhinitis 09/14/2013  . ADD (attention deficit disorder) 03/10/2012  . Tobacco use 09/10/2011  . Visit for preventive health examination 09/10/2011  . MS (multiple sclerosis) ([place]) 09/04/2011    Carney Living  PT DPT  01/08/2018, 12:58 PM  [facility] [address] [place], [place], [address] Phone: [phone]   Fax:  [phone]  Name: [name] MRN: [id number] Date of Birth: [birth year]

## 2018-01-09 ENCOUNTER — Ambulatory Visit: Payer: 59 | Admitting: Physical Therapy

## 2018-01-09 ENCOUNTER — Encounter: Payer: Self-pay | Admitting: Physical Therapy

## 2018-01-09 DIAGNOSIS — M25562 Pain in left knee: Secondary | ICD-10-CM | POA: Diagnosis not present

## 2018-01-09 DIAGNOSIS — M25662 Stiffness of left knee, not elsewhere classified: Secondary | ICD-10-CM

## 2018-01-09 DIAGNOSIS — R2689 Other abnormalities of gait and mobility: Secondary | ICD-10-CM

## 2018-01-09 NOTE — Therapy (Signed)
[place], [place], [address] Phone: [phone]   Fax:  [phone]  Physical Therapy Treatment  Patient Details  Name: [name] MRN: [id number] Date of Birth: 17-Mar-1961 Referring Provider: Dr Frankey Shown    Encounter Date: 01/09/2018  PT End of Session - 01/09/18 1621    Visit Number  10    Number of Visits  12    Date for PT Re-Evaluation  01/15/18    Authorization Type  [facility] UMR     PT Start Time  0300    PT Stop Time  0338    PT Time Calculation (min)  38 min    Activity Tolerance  Patient tolerated treatment well    Behavior During Therapy  [facility] for tasks assessed/performed       Past Medical History:  Diagnosis Date  . ADD (attention deficit disorder with hyperactivity)   . Anxiety   . Arthritis   . Cancer ([place])    Skin cancer- basal cell, squamous cell  . Cervical dysplasia   . GERD (gastroesophageal reflux disease)   . Headache    migraine  . Hepatitis    Pt states Core and surface antibioties for Hepatitis whens he was 52  . Mitral valve prolapse   . Multiple sclerosis ([place])   . Multiple sclerosis ([place])   . Tendonitis in rigtht wrist     Past Surgical History:  Procedure Laterality Date  . ABDOMINAL HYSTERECTOMY  2004   ovaries retained  . BACK SURGERY    . heart ablation     15 yrs ago for a fib by Dr.Klein, everything ok  . KNEE ARTHROSCOPY WITH SUBCHONDROPLASTY Left 11/13/2017   Procedure: LEFT KNEE ARTHROSCOPY WITH SUBCHONDROPLASTY, PARTIAL MEDIAL MENISCECTOMY, SYNOVECTOMY;  Surgeon: Leandrew Koyanagi, MD;  Location: [facility];  Service: Orthopedics;  Laterality: Left;  . TUBAL LIGATION      There were no vitals filed for this visit.  Subjective Assessment - 01/09/18 1619    Subjective  Patient is doing much better. She had some pain in her ankle today but that happens she reports. No pain in her knee.     Pertinent History  Multiple Sclerosis, Stress  fracture of the tibia     Limitations  Standing;Walking    How long can you stand comfortably?  10-15 minutes    How long can you walk comfortably?  can walk community distances but has pain     Diagnostic tests  nothing post-op     Currently in Pain?  No/denies                       [facility] Adult PT Treatment/Exercise - 01/09/18 0001      Knee/Hip Exercises: Stretches   Active Hamstring Stretch Limitations  3x20 sec hold with strap       Knee/Hip Exercises: Standing   Hip Flexion  2 sets;10 reps;Limitations    Other Standing Knee Exercises  hip abduction 2x15 hip extension 2x15       Knee/Hip Exercises: Supine   Short Arc Quad Sets Limitations  3x10 2lb     Straight Leg Raises Limitations  2x10 2lbs     Other Supine Knee/Hip Exercises  supine clamshell x20 blue              PT Education - 01/09/18 1621    Education Details  reviewed standing exercises     Person(s) Educated  Patient    Methods  Explanation;Demonstration;Tactile cues;Verbal cues    Comprehension  Verbalized understanding;Returned demonstration;Verbal cues required;Tactile cues required       PT Short Term Goals - 01/08/18 1255      PT SHORT TERM GOAL #1   Title  Patient will be independent with basic HEP     Time  4    Period  Weeks    Status  On-going      PT SHORT TERM GOAL #2   Title  Patient will demsotrate full pain free passive knee extension and flexion     Baseline  pain with extension     Time  4    Period  Weeks    Status  On-going      PT SHORT TERM GOAL #3   Title  Patient will increase left single leg stance to 15 sec     Time  4    Period  Weeks    Status  On-going      PT SHORT TERM GOAL #4   Title  Patient will increase gorss left leg strength to 5/5     Baseline  working on strengthening     Time  4    Period  Weeks    Status  On-going        PT Long Term Goals - 12/03/17 1203      PT [place] #1   Title  Patient will stand for 2 hours  without self reported increase in painand swelling in order to return to work     Time  8    Period  Weeks    Status  New    Target Date  01/28/18      PT LONG TERM GOAL #2   Title  Patient will go up/down 14 steps without pain in order to get to her bedroom     Time  8    Period  Weeks    Status  New    Target Date  01/29/18      PT LONG TERM GOAL #3   Title  Patient will demonstrate a 41 % limitation on FOTO     Time  8    Period  Weeks    Status  New    Target Date  01/29/18            Plan - 01/09/18 1622    Clinical Impression Statement  Patient continues to make good progress. Anticipate D/ C next week. Progress exercises and weights as tolerated.     Clinical Presentation  Stable    Clinical Decision Making  Low    Rehab Potential  Good    PT Frequency  2x / week    PT Duration  6 weeks    PT Treatment/Interventions  ADLs/Self Care Home Management;Cryotherapy;Electrical Stimulation;Iontophoresis 4mg /ml Dexamethasone;Moist Heat;Ultrasound;Gait training;Stair training;Therapeutic activities;Therapeutic exercise;Neuromuscular re-education;Patient/family education;Manual techniques;Passive range of motion;Taping    PT Next Visit Plan  continue with PROM with focus on extension; add bike; add bridge; add light leg press; add standing march add SAQ     PT Home Exercise Plan  quad sets; SLR; knee extension stretch; supine ball squeeze     Consulted and Agree with Plan of Care  Patient       Patient will benefit from skilled therapeutic intervention in order to improve the following deficits and impairments:  Abnormal gait, Pain, Decreased endurance, Decreased activity tolerance, Decreased range of motion, Decreased strength, Difficulty walking  Visit Diagnosis: Acute pain of left knee  Stiffness of left knee, not elsewhere classified  Other abnormalities of gait and mobility     Problem List Patient Active Problem List   Diagnosis Date Noted  . Chondromalacia  of left knee 11/13/2017  . Acute medial meniscus tear 11/05/2017  . Synovitis of left knee 11/05/2017  . Insufficiency fracture of tibia 11/05/2017  . Effusion, left knee 10/23/2017  . Left knee pain 10/20/2017  . Right leg weakness 12/19/2016  . Acute upper respiratory infection 03/13/2016  . Cervical radiculitis 11/29/2015  . Numbness 11/29/2015  . Squamous cell carcinoma 05/25/2015  . Neck pain on right side 05/03/2015  . 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 Ataxic gait 11/10/2014  . High risk medication use 11/10/2014  . Urinary frequency 11/10/2014  . Right carpal tunnel syndrome 11/10/2014  . Influenza-like symptoms 02/03/2014  . Allergic rhinitis 09/14/2013  . ADD (attention deficit disorder) 03/10/2012  . Tobacco use 09/10/2011  . Visit for preventive health examination 09/10/2011  . MS (multiple sclerosis) ([place]) 09/04/2011    Carney Living  PT DPT  01/09/2018, 4:23 PM  [facility] [address] [place], [place], [address] Phone: [phone]   Fax:  [phone]  Name: [name] MRN: [id number] Date of Birth: [birth year]

## 2018-01-10 ENCOUNTER — Other Ambulatory Visit: Payer: Self-pay | Admitting: Neurology

## 2018-01-10 MED FILL — FLUoxetine HCL 20 MG CAPS: 20 | 90 days supply | Qty: 270 | Fill #0

## 2018-01-10 MED FILL — VYVANSE 60 MG CAPSULE: 60 | 90 days supply | Qty: 90 | Fill #0

## 2018-01-14 ENCOUNTER — Encounter: Payer: Self-pay | Admitting: Physical Therapy

## 2018-01-14 ENCOUNTER — Ambulatory Visit: Payer: 59 | Admitting: Physical Therapy

## 2018-01-14 DIAGNOSIS — M25662 Stiffness of left knee, not elsewhere classified: Secondary | ICD-10-CM

## 2018-01-14 DIAGNOSIS — R2689 Other abnormalities of gait and mobility: Secondary | ICD-10-CM | POA: Diagnosis not present

## 2018-01-14 DIAGNOSIS — M25562 Pain in left knee: Secondary | ICD-10-CM

## 2018-01-14 NOTE — Therapy (Signed)
[place], [place], [address] Phone: [phone]   Fax:  [phone]  Physical Therapy Treatment  Patient Details  Name: [name] MRN: [id number] Date of Birth: 03/10/1961 Referring Provider: Dr Frankey Shown    Encounter Date: 01/14/2018  PT End of Session - 01/14/18 1443    Visit Number  11    Number of Visits  12    Date for PT Re-Evaluation  01/15/18    Authorization Type  [facility] UMR     PT Start Time  8264    PT Stop Time  1526    PT Time Calculation (min)  43 min    Activity Tolerance  Patient tolerated treatment well    Behavior During Therapy  [facility] for tasks assessed/performed       Past Medical History:  Diagnosis Date  . ADD (attention deficit disorder with hyperactivity)   . Anxiety   . Arthritis   . Cancer ([place])    Skin cancer- basal cell, squamous cell  . Cervical dysplasia   . GERD (gastroesophageal reflux disease)   . Headache    migraine  . Hepatitis    Pt states Core and surface antibioties for Hepatitis whens he was 23  . Mitral valve prolapse   . Multiple sclerosis ([place])   . Multiple sclerosis ([place])   . Tendonitis in rigtht wrist     Past Surgical History:  Procedure Laterality Date  . ABDOMINAL HYSTERECTOMY  2004   ovaries retained  . BACK SURGERY    . heart ablation     15 yrs ago for a fib by Dr.Klein, everything ok  . KNEE ARTHROSCOPY WITH SUBCHONDROPLASTY Left 11/13/2017   Procedure: LEFT KNEE ARTHROSCOPY WITH SUBCHONDROPLASTY, PARTIAL MEDIAL MENISCECTOMY, SYNOVECTOMY;  Surgeon: Leandrew Koyanagi, MD;  Location: [facility];  Service: Orthopedics;  Laterality: Left;  . TUBAL LIGATION      There were no vitals filed for this visit.  Subjective Assessment - 01/14/18 1443    Subjective  Any time I come here from work i have pain. it feels tight in the back of the knee.     Currently in Pain?  No/denies         [facility] PT Assessment - 01/14/18 0001       Observation/Other Assessments   Focus on Therapeutic Outcomes (FOTO)   28% limited                   [facility] Adult PT Treatment/Exercise - 01/14/18 0001      Exercises   Exercises  Knee/Hip      Knee/Hip Exercises: Stretches   Passive Hamstring Stretch Limitations  supine with strap    Gastroc Stretch  30 seconds    Soleus Stretch  30 seconds      Knee/Hip Exercises: Aerobic   Stationary Bike  5 min L3      Knee/Hip Exercises: Machines for Strengthening   Cybex Leg Press  2 plates, quick press with slow lower      Knee/Hip Exercises: Standing   Other Standing Knee Exercises  step down      Knee/Hip Exercises: Sidelying   Hip ABduction  20 reps             PT Education - 01/14/18 1549    Education Details  goals discussion, daily activities, stretching at work, [employer]) Educated  Patient    Methods  Explanation  Comprehension  Verbalized understanding       PT Short Term Goals - 01/14/18 1508      PT SHORT TERM GOAL #1   Title  Patient will be independent with basic HEP     Status  Achieved      PT SHORT TERM GOAL #2   Title  Patient will demsotrate full pain free passive knee extension and flexion     Status  Achieved      PT SHORT TERM GOAL #3   Title  Patient will increase left single leg stance to 15 sec     Status  Achieved      PT SHORT TERM GOAL #4   Title  Patient will increase gorss left leg strength to 5/5     Status  Achieved        PT Long Term Goals - 01/14/18 1511      PT LONG TERM GOAL #1   Title  Patient will stand for 2 hours without self reported increase in painand swelling in order to return to work     Baseline  up and down a lot at work but could if I had to    Status  Achieved      PT [place] #2   Title  Patient will go up/down 14 steps without pain in order to get to her bedroom     Baseline  doing better with them, still does a step to pattern occasionally    Status  Partially Met      PT  LONG TERM GOAL #3   Title  Patient will demonstrate a 41 % limitation on FOTO     Baseline  28% limited    Status  Achieved            Plan - 01/14/18 1545    Clinical Impression Statement  Pt has met her goals and is prepared for d/c at her next visit. step downs are where she is most limited which we practiced today. Provided her with options for stretching during her day at work to reduce tightness.     PT Treatment/Interventions  ADLs/Self Care Home Management;Cryotherapy;Electrical Stimulation;Iontophoresis '4mg'$ /ml Dexamethasone;Moist Heat;Ultrasound;Gait training;Stair training;Therapeutic activities;Therapeutic exercise;Neuromuscular re-education;Patient/family education;Manual techniques;Passive range of motion;Taping    PT Next Visit Plan  d/c    Consulted and Agree with Plan of Care  Patient       Patient will benefit from skilled therapeutic intervention in order to improve the following deficits and impairments:  Abnormal gait, Pain, Decreased endurance, Decreased activity tolerance, Decreased range of motion, Decreased strength, Difficulty walking  Visit Diagnosis: Acute pain of left knee  Stiffness of left knee, not elsewhere classified  Other abnormalities of gait and mobility     Problem List Patient Active Problem List   Diagnosis Date Noted  . 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 Acute medial meniscus tear 11/05/2017  . Synovitis of left knee 11/05/2017  . Insufficiency fracture of tibia 11/05/2017  . Effusion, left knee 10/23/2017  . Left knee pain 10/20/2017  . Right leg weakness 12/19/2016  . Acute upper respiratory infection 03/13/2016  . Cervical radiculitis 11/29/2015  . Numbness 11/29/2015  . Squamous cell carcinoma 05/25/2015  . Neck pain on right side 05/03/2015  . 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 Ataxic gait 11/10/2014  . High risk medication use 11/10/2014  . Urinary frequency 11/10/2014  . Right carpal tunnel syndrome 11/10/2014  .  Influenza-like symptoms 02/03/2014  . Allergic  rhinitis 09/14/2013  . ADD (attention deficit disorder) 03/10/2012  . Tobacco use 09/10/2011  . Visit for preventive health examination 09/10/2011  . MS (multiple sclerosis) ([place]) 09/04/2011    Djuna Frechette C. Audrea Bolte PT, DPT 01/14/18 3:51 PM   [facility] [address] [place], [place], [address] Phone: [phone]   Fax:  [phone]  Name: [name] MRN: [id number] Date of Birth: Aug 06, 1960

## 2018-01-16 ENCOUNTER — Encounter: Payer: Self-pay | Admitting: Physical Therapy

## 2018-01-16 ENCOUNTER — Ambulatory Visit: Payer: 59 | Admitting: Physical Therapy

## 2018-01-16 DIAGNOSIS — M25662 Stiffness of left knee, not elsewhere classified: Secondary | ICD-10-CM

## 2018-01-16 DIAGNOSIS — R2689 Other abnormalities of gait and mobility: Secondary | ICD-10-CM | POA: Diagnosis not present

## 2018-01-16 DIAGNOSIS — M25562 Pain in left knee: Secondary | ICD-10-CM | POA: Diagnosis not present

## 2018-01-16 NOTE — Therapy (Signed)
[place], [place], [address] Phone: [phone]   Fax:  [phone]  Physical Therapy Treatment/Discharge Summary  Patient Details  Name: [name] MRN: [id number] Date of Birth: 1961/03/18 Referring Provider: Frankey Shown, MD   Encounter Date: 01/16/2018  PT End of Session - 01/16/18 1445    Visit Number  12    Number of Visits  12    Date for PT Re-Evaluation  01/16/18    Authorization Type  [facility]     PT Start Time  1388    PT Stop Time  1515    PT Time Calculation (min)  30 min    Activity Tolerance  Patient tolerated treatment well    Behavior During Therapy  [facility] for tasks assessed/performed       Past Medical History:  Diagnosis Date  . ADD (attention deficit disorder with hyperactivity)   . Anxiety   . Arthritis   . Cancer ([place])    Skin cancer- basal cell, squamous cell  . Cervical dysplasia   . GERD (gastroesophageal reflux disease)   . Headache    migraine  . Hepatitis    Pt states Core and surface antibioties for Hepatitis whens he was 51  . Mitral valve prolapse   . Multiple sclerosis ([place])   . Multiple sclerosis ([place])   . Tendonitis in rigtht wrist     Past Surgical History:  Procedure Laterality Date  . ABDOMINAL HYSTERECTOMY  2004   ovaries retained  . BACK SURGERY    . heart ablation     15 yrs ago for a fib by Dr.Klein, everything ok  . KNEE ARTHROSCOPY WITH SUBCHONDROPLASTY Left 11/13/2017   Procedure: LEFT KNEE ARTHROSCOPY WITH SUBCHONDROPLASTY, PARTIAL MEDIAL MENISCECTOMY, SYNOVECTOMY;  Surgeon: Leandrew Koyanagi, MD;  Location: [facility];  Service: Orthopedics;  Laterality: Left;  . TUBAL LIGATION      There were no vitals filed for this visit.  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 was able to incoorporate stretches at work.     Patient Stated Goals  get back to work    Currently in Pain?  Yes    Pain Score  5     Pain Location  Knee    Pain Descriptors / Indicators  Aching         [facility] PT Assessment - 01/16/18 0001      Assessment   Medical Diagnosis  Left Knee Medial meniscal debirdement and chndroplasty     Referring Provider  Frankey Shown, MD    Onset Date/Surgical Date  11/13/17      Observation/Other Assessments   Focus on Therapeutic Outcomes (FOTO)   28% limited      Strength   Left Hip Flexion  5/5    Left Knee Extension  5/5                   [place] Adult PT Treatment/Exercise - 01/16/18 0001      Therapeutic Activites    Therapeutic Activities  Work Simulation    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      Modalities   Modalities  Cryotherapy      Cryotherapy   Number Minutes Cryotherapy  10 Minutes    Cryotherapy Location  Knee;Ankle    Type of Cryotherapy  Ice pack  PT Short Term Goals - 01/14/18 1508      PT SHORT TERM GOAL #1   Title  Patient will be independent with basic HEP     Status  Achieved      PT SHORT TERM GOAL #2   Title  Patient will demsotrate full pain free passive knee extension and flexion     Status  Achieved      PT SHORT TERM GOAL #3   Title  Patient will increase left single leg stance to 15 sec     Status  Achieved      PT SHORT TERM GOAL #4   Title  Patient will increase gorss left leg strength to 5/5     Status  Achieved        PT Long Term Goals - 01/14/18 1511      PT LONG TERM GOAL #1   Title  Patient will stand for 2 hours without self reported increase in painand swelling in order to return to work     Baseline  up and down a lot at work but could if I had to    Status  Achieved      PT [place] #2   Title  Patient will go up/down 14 steps without pain in order to get to her bedroom     Baseline  doing better with them, still does a step to pattern occasionally    Status  Partially Met      PT LONG TERM GOAL #3   Title  Patient will  demonstrate a 41 % limitation on FOTO     Baseline  28% limited    Status  Achieved            Plan - 01/16/18 1502    Clinical Impression Statement  Pt was feeling good until she was moving beds around. 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 Pt is d/c to independent program today and encouraged to contact us with any further questions.     PT Treatment/Interventions  ADLs/Self Care Home Management;Cryotherapy;Electrical Stimulation;Iontophoresis 43m/ml Dexamethasone;Moist Heat;Ultrasound;Gait training;Stair training;Therapeutic activities;Therapeutic exercise;Neuromuscular re-education;Patient/family education;Manual techniques;Passive range of motion;Taping    Consulted and Agree with Plan of Care  Patient       Patient will benefit from skilled therapeutic intervention in order to improve the following deficits and impairments:  Abnormal gait, Pain, Decreased endurance, Decreased activity tolerance, Decreased range of motion, Decreased strength, Difficulty walking  Visit Diagnosis: Acute pain of left knee - Plan: PT plan of care cert/re-cert  Stiffness of left knee, not elsewhere classified - Plan: PT plan of care cert/re-cert  Other abnormalities of gait and mobility - Plan: PT plan of care cert/re-cert     Problem List Patient Active Problem List   Diagnosis Date Noted  . 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 Acute medial meniscus tear 11/05/2017  . Synovitis of left knee 11/05/2017  . Insufficiency fracture of tibia 11/05/2017  . Effusion, left knee 10/23/2017  . Left knee pain 10/20/2017  . Right leg weakness 12/19/2016  . Acute upper respiratory infection 03/13/2016  . Cervical radiculitis 11/29/2015  . Numbness 11/29/2015  . Squamous cell carcinoma 05/25/2015  . Neck pain on right side 05/03/2015  . 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 Ataxic gait 11/10/2014  . High risk medication use 11/10/2014  .  Urinary frequency 11/10/2014  . Right carpal tunnel syndrome 11/10/2014  . Influenza-like  symptoms 02/03/2014  . Allergic rhinitis 09/14/2013  . ADD (attention deficit disorder) 03/10/2012  . Tobacco use 09/10/2011  . Visit for preventive health examination 09/10/2011  . MS (multiple sclerosis) ([place]) 09/04/2011   PHYSICAL THERAPY DISCHARGE SUMMARY  Visits from Start of Care: 12  Current functional level related to goals / functional outcomes: See above   Remaining deficits: See above   Education / Equipment: Anatomy of condition, POC, HEP, exercise form/rationale  Plan: Patient agrees to discharge.  Patient goals were met. Patient is being discharged due to meeting the stated rehab goals.  ?????     Shania Bjelland C. Mayme Profeta PT, DPT 01/16/18 3:20 PM   [facility] [address] [place], [place], [address] Phone: [phone]   Fax:  [phone]  Name: [name] MRN: [id number] Date of Birth: May 19, 1961

## 2018-01-28 ENCOUNTER — Ambulatory Visit (INDEPENDENT_AMBULATORY_CARE_PROVIDER_SITE_OTHER): Payer: 59 | Admitting: Orthopaedic Surgery

## 2018-01-28 ENCOUNTER — Encounter (INDEPENDENT_AMBULATORY_CARE_PROVIDER_SITE_OTHER): Payer: Self-pay | Admitting: Orthopaedic Surgery

## 2018-01-28 ENCOUNTER — Telehealth (INDEPENDENT_AMBULATORY_CARE_PROVIDER_SITE_OTHER): Payer: Self-pay

## 2018-01-28 DIAGNOSIS — M25552 Pain in left hip: Secondary | ICD-10-CM

## 2018-01-28 NOTE — Progress Notes (Signed)
Post-Op Visit Note   Patient: [name]           Date of Birth: [birth year]           MRN: [id number] Visit Date: 01/28/2018 PCP: Patient, No Pcp Per   Assessment & Plan:  Chief Complaint:  Chief Complaint  Patient presents with  . Left Knee - Pain, Routine Post Op   Visit Diagnoses:  1. Pain in left hip     Plan: Patient is a pleasant 57 year old female who presents to our clinic today 76 days status post left knee arthroscopic medial meniscectomy and sub-chondroplasty, date of surgery 11/13/2017.  She is significantly better than she was preop, but does note achiness and soreness throughout the day on a daily basis.  She is taking over-the-counter medications for this nearly every day.  The pain she has is to the anterior medial aspect.  Worse being on her feet all day while at work.  She does have trouble sleeping, but is unsure whether this is due to her knee pain.  Examination of the left knee reveals well healed surgical portals without evidence of infection.  She does have a trace effusion.  Range of motion 0 to 115 degrees.  At this point, I believe the pain she is getting is from her underlying arthritis which is manageable.  She will continue to advance with activity as tolerated.  We will send for visco approval. She will follow-up with [place] once approved.  Call if concerns or questions.  Sample of pennsaid provided today.  Follow-Up Instructions: Return if symptoms worsen or fail to improve.   Orders:  No orders of the defined types were placed in this encounter.  No orders of the defined types were placed in this encounter.   Imaging: No results found.  PMFS History: Patient Active Problem List   Diagnosis Date Noted  . Pain in left hip 01/28/2018  . 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 Acute medial meniscus tear 11/05/2017  . Synovitis of left knee 11/05/2017  . Insufficiency fracture of tibia 11/05/2017  . Effusion, left knee 10/23/2017  . Left knee  pain 10/20/2017  . Right leg weakness 12/19/2016  . Acute upper respiratory infection 03/13/2016  . Cervical radiculitis 11/29/2015  . Numbness 11/29/2015  . Squamous cell carcinoma 05/25/2015  . Neck pain on right side 05/03/2015  . 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 Ataxic gait 11/10/2014  . High risk medication use 11/10/2014  . Urinary frequency 11/10/2014  . Right carpal tunnel syndrome 11/10/2014  . Influenza-like symptoms 02/03/2014  . Allergic rhinitis 09/14/2013  . ADD (attention deficit disorder) 03/10/2012  . Tobacco use 09/10/2011  . Visit for preventive health examination 09/10/2011  . MS (multiple sclerosis) ([place]) 09/04/2011   Past Medical History:  Diagnosis Date  . ADD (attention deficit disorder with hyperactivity)   . Anxiety   . Arthritis   . Cancer ([place])    Skin cancer- basal cell, squamous cell  . Cervical dysplasia   . GERD (gastroesophageal reflux disease)   . Headache    migraine  . Hepatitis    Pt states Core and surface antibioties for Hepatitis whens he was 25  . Mitral valve prolapse   . Multiple sclerosis ([place])   . Multiple sclerosis ([place])   . Tendonitis in rigtht wrist     Family History  Problem Relation Age of Onset  . Depression Mother   . Stroke Mother   . Heart disease Mother   .  Dementia Mother   . Cancer Neg Hx   . Diabetes Neg Hx     Past Surgical History:  Procedure Laterality Date  . ABDOMINAL HYSTERECTOMY  2004   ovaries retained  . BACK SURGERY    . heart ablation     15 yrs ago for a fib by Dr.Klein, everything ok  . KNEE ARTHROSCOPY WITH SUBCHONDROPLASTY Left 11/13/2017   Procedure: LEFT KNEE ARTHROSCOPY WITH SUBCHONDROPLASTY, PARTIAL MEDIAL MENISCECTOMY, SYNOVECTOMY;  Surgeon: Leandrew Koyanagi, MD;  Location: [facility];  Service: Orthopedics;  Laterality: Left;  . TUBAL LIGATION     Social History   Occupational History  . Not on file  Tobacco Use  . Smoking status: Current Some Day Smoker    Packs/day:  0.25    Types: Cigarettes  . Smokeless tobacco: Never Used  Substance and Sexual Activity  . Alcohol use: Yes    Alcohol/week: 0.0 standard drinks    Comment: Occasioanl glass of wine   . Drug use: No  . Sexual activity: Yes    Birth control/protection: Surgical

## 2018-01-28 NOTE — Telephone Encounter (Signed)
Please submit for gel inj -monovisc -Dr Erlinda Hong Left knee

## 2018-01-29 ENCOUNTER — Telehealth (INDEPENDENT_AMBULATORY_CARE_PROVIDER_SITE_OTHER): Payer: Self-pay

## 2018-01-29 NOTE — Telephone Encounter (Signed)
Noted  

## 2018-01-29 NOTE — Telephone Encounter (Signed)
Submitted VOB for Monovisc, Left Knee.

## 2018-02-03 ENCOUNTER — Telehealth (INDEPENDENT_AMBULATORY_CARE_PROVIDER_SITE_OTHER): Payer: Self-pay

## 2018-02-03 ENCOUNTER — Ambulatory Visit (INDEPENDENT_AMBULATORY_CARE_PROVIDER_SITE_OTHER): Payer: 59 | Admitting: Psychiatry

## 2018-02-03 ENCOUNTER — Encounter (HOSPITAL_COMMUNITY): Payer: Self-pay | Admitting: Psychiatry

## 2018-02-03 DIAGNOSIS — Z79899 Other long term (current) drug therapy: Secondary | ICD-10-CM | POA: Diagnosis not present

## 2018-02-03 DIAGNOSIS — F17210 Nicotine dependence, cigarettes, uncomplicated: Secondary | ICD-10-CM

## 2018-02-03 DIAGNOSIS — Z818 Family history of other mental and behavioral disorders: Secondary | ICD-10-CM | POA: Diagnosis not present

## 2018-02-03 DIAGNOSIS — F900 Attention-deficit hyperactivity disorder, predominantly inattentive type: Secondary | ICD-10-CM

## 2018-02-03 MED ORDER — LISDEXAMFETAMINE DIMESYLATE 60 MG PO CAPS: 60.0000 mg | ORAL_CAPSULE | ORAL | 0 refills | Status: DC

## 2018-02-03 NOTE — Telephone Encounter (Signed)
Patient is approved for Monovisc, left knee. [place] Covered at 100% through insurance No Co-pay No PA required  Called and left a VM for patient to call back and schedule an appointment with Dr. Erlinda Hong for gel injection.

## 2018-02-03 NOTE — Progress Notes (Signed)
[facility] MD/PA/NP OP Progress Note  02/03/2018 3:49 PM [name]  MRN:  [id number]  Chief Complaint: I have knee surgery but I am doing better.  HPI: Patient came for her follow-up appointment.  She is doing better since she had surgery 3 months ago.  She is taking Vyvanse at present dose.  She is able to do multitasking.  Her attention concentration is good.  She has no side effects from the medication.  She is also taking Prozac which is given by her neurologist.  Her MS is stable.  Patient denies any hallucination, paranoia, suicidal thoughts or homicidal thought.  Her appetite is okay.  Her energy level is fair.  Visit Diagnosis:    ICD-10-CM   1. Attention deficit hyperactivity disorder (ADHD), predominantly inattentive type F90.0 lisdexamfetamine (VYVANSE) 60 MG capsule    Past Psychiatric History: Reviewed Patient has history of depression started when she was in the process of divorce. She was given Zoloft and then Prozac. She also given Focalin, Ritalin and Strattera. Patient denies any history of suicidal attempt or any psychosis  Past Medical History:  Past Medical History:  Diagnosis Date  . ADD (attention deficit disorder with hyperactivity)   . Anxiety   . Arthritis   . Cancer ([place])    Skin cancer- basal cell, squamous cell  . Cervical dysplasia   . GERD (gastroesophageal reflux disease)   . Headache    migraine  . Hepatitis    Pt states Core and surface antibioties for Hepatitis whens he was 28  . Mitral valve prolapse   . Multiple sclerosis ([place])   . Multiple sclerosis ([place])   . Tendonitis in rigtht wrist     Past Surgical History:  Procedure Laterality Date  . ABDOMINAL HYSTERECTOMY  2004   ovaries retained  . BACK SURGERY    . heart ablation     15 yrs ago for a fib by Dr.Klein, everything ok  . KNEE ARTHROSCOPY WITH SUBCHONDROPLASTY Left 11/13/2017   Procedure: LEFT KNEE ARTHROSCOPY WITH SUBCHONDROPLASTY, PARTIAL MEDIAL MENISCECTOMY, SYNOVECTOMY;   Surgeon: Leandrew Koyanagi, MD;  Location: [facility];  Service: Orthopedics;  Laterality: Left;  . TUBAL LIGATION      Family Psychiatric History: Reviewed.  Family History:  Family History  Problem Relation Age of Onset  . Depression Mother   . Stroke Mother   . Heart disease Mother   . Dementia Mother   . Cancer Neg Hx   . Diabetes Neg Hx     Social History:  Social History   Socioeconomic History  . Marital status: Divorced    Spouse name: Not on file  . Number of children: Not on file  . Years of education: Not on file  . Highest education level: Not on file  Occupational History  . Not on file  Social Needs  . Financial resource strain: Not on file  . Food insecurity:    Worry: Not on file    Inability: Not on file  . Transportation needs:    Medical: Not on file    Non-medical: Not on file  Tobacco Use  . Smoking status: Current Some Day Smoker    Packs/day: 0.25    Types: Cigarettes  . Smokeless tobacco: Never Used  Substance and Sexual Activity  . Alcohol use: Yes    Alcohol/week: 0.0 standard drinks    Comment: Occasioanl glass of wine   . Drug use: No  . Sexual activity: Yes  Birth control/protection: Surgical  Lifestyle  . Physical activity:    Days per week: Not on file    Minutes per session: Not on file  . Stress: Not on file  Relationships  . Social connections:    Talks on phone: Not on file    Gets together: Not on file    Attends religious service: Not on file    Active member of club or organization: Not on file    Attends meetings of clubs or organizations: Not on file    Relationship status: Not on file  Other Topics Concern  . Not on file  Social History Narrative  . Not on file    Allergies:  Allergies  Allergen Reactions  . Methylprednisolone Hives  . Solu-Medrol [Methylprednisolone Acetate] Hives and Itching  . Cephalexin Hives and Itching    Metabolic Disorder Labs: No results found for: HGBA1C,  MPG No results found for: PROLACTIN No results found for: CHOL, TRIG, HDL, CHOLHDL, VLDL, LDLCALC Lab Results  Component Value Date   TSH 1.220 10/29/2017    Therapeutic Level Labs: No results found for: LITHIUM No results found for: VALPROATE No components found for:  CBMZ  Current Medications: Current Outpatient Medications  Medication Sig Dispense Refill  . calcium-vitamin D (OSCAL WITH D) 500-200 MG-UNIT tablet Take 1 tablet by mouth 3 (three) times daily. 90 tablet 12  . diclofenac (VOLTAREN) 75 MG EC tablet Take 1 tablet (75 mg total) by mouth 2 (two) times daily. 60 tablet 1  . FLUoxetine (PROZAC) 20 MG capsule Take 3 capsules daily 270 capsule 3  . FLUoxetine (PROZAC) 20 MG capsule TAKE 3 CAPSULES (60 MG TOTAL) BY MOUTH DAILY 270 capsule 4  . lisdexamfetamine (VYVANSE) 60 MG capsule Take 1 capsule (60 mg total) by mouth every morning. 90 capsule 0  . mirabegron ER (MYRBETRIQ) 50 MG TB24 tablet Take 1 tablet (50 mg total) by mouth daily. 90 tablet 4  . Ocrelizumab (OCREVUS IV) Ocrevus    . ocrelizumab 600 mg in sodium chloride 0.9 % 500 mL Inject 600 mg into the vein every 6 (six) months.    [name] oxybutynin (DITROPAN XL) 10 MG 24 hr tablet Take 1 tablet (10 mg total) by mouth at bedtime. 90 tablet 3  . oxyCODONE-acetaminophen (PERCOCET) 5-325 MG tablet Take 1 tablet by mouth 2 (two) times daily as needed for severe pain. 14 tablet 0  . pantoprazole (PROTONIX) 40 MG tablet Take 40 mg by mouth daily.    [name] zinc sulfate 220 (50 Zn) MG capsule Take 1 capsule (220 mg total) by mouth daily. 42 capsule 0  . ondansetron (ZOFRAN) 4 MG tablet Take 1-2 tablets (4-8 mg total) by mouth every 8 (eight) hours as needed for nausea or vomiting. (Patient not taking: Reported on 12/03/2017) 40 tablet 0  . oxyCODONE-acetaminophen (PERCOCET) 5-325 MG tablet Take 1-2 tablets by mouth every 4 (four) hours as needed for severe pain. (Patient not taking: Reported on 12/03/2017) 30 tablet 0  . promethazine  (PHENERGAN) 25 MG tablet Take 1 tablet (25 mg total) by mouth every 6 (six) hours as needed for nausea. (Patient not taking: Reported on 12/03/2017) 30 tablet 1  . traMADol (ULTRAM) 50 MG tablet Take 1-2 tablets (50-100 mg total) by mouth 3 (three) times daily as needed. (Patient not taking: Reported on 12/03/2017) 30 tablet 2   No current facility-administered medications for this visit.      Musculoskeletal: Strength & Muscle Tone: within normal limits Gait & Station:  normal Patient leans: N/A  Psychiatric Specialty Exam: ROS  Blood pressure 128/76, pulse 74, height 5' 7.5" (1.715 m), weight 189 lb (85.7 kg).Body mass index is 29.16 kg/m.  General Appearance: Casual  Eye Contact:  Good  Speech:  Clear and Coherent  Volume:  Normal  Mood:  Euthymic  Affect:  Congruent  Thought Process:  Goal Directed  Orientation:  Full (Time, Place, and Person)  Thought Content: Logical   Suicidal Thoughts:  No  Homicidal Thoughts:  No  Memory:  Immediate;   Good Recent;   Good Remote;   Good  Judgement:  Good  Insight:  Good  Psychomotor Activity:  Normal  Concentration:  Concentration: Good and Attention Span: Good  Recall:  Good  Fund of Knowledge: Good  Language: Good  Akathisia:  No  Handed:  Right  AIMS (if indicated): not done  Assets:  Communication Skills Desire for Improvement Housing Resilience  ADL's:  Intact  Cognition: WNL  Sleep:  Good   Screenings: PHQ2-9     Office Visit from 03/13/2016 in [place]  PHQ-2 Total Score  0       Assessment and Plan: Attention deficit disorder, inattentive type.  Major depressive disorder, recurrent.  Patient doing better on Vyvanse.  She has no side effects.  She is also taking Prozac 60 mg from her neurologist.  Recommended to call us back if she has any question or any concern.  Follow-up in 3 months.   Kathlee Nations, MD 02/03/2018, 3:49 PM

## 2018-02-04 ENCOUNTER — Ambulatory Visit (HOSPITAL_COMMUNITY): Payer: Self-pay | Admitting: Psychiatry

## 2018-02-12 ENCOUNTER — Encounter (INDEPENDENT_AMBULATORY_CARE_PROVIDER_SITE_OTHER): Payer: Self-pay | Admitting: Orthopaedic Surgery

## 2018-02-12 ENCOUNTER — Ambulatory Visit (INDEPENDENT_AMBULATORY_CARE_PROVIDER_SITE_OTHER): Payer: 59 | Admitting: Orthopaedic Surgery

## 2018-02-12 DIAGNOSIS — M1712 Unilateral primary osteoarthritis, left knee: Secondary | ICD-10-CM | POA: Diagnosis not present

## 2018-02-12 DIAGNOSIS — M94262 Chondromalacia, left knee: Secondary | ICD-10-CM | POA: Diagnosis not present

## 2018-02-12 MED ORDER — HYALURONAN 88 MG/4ML IX SOSY
88.0000 mg | PREFILLED_SYRINGE | INTRA_ARTICULAR | Status: AC | PRN
  Administered 2018-02-12: 88 mg via INTRA_ARTICULAR

## 2018-02-12 NOTE — Progress Notes (Signed)
   Procedure Note  Patient: [name]             Date of Birth: 1960/09/05           MRN: [id number]             Visit Date: 02/12/2018  Procedures: Visit Diagnoses: Unilateral primary osteoarthritis, left knee  Chondromalacia of left knee  Large Joint Inj: L knee on 02/12/2018 4:28 PM Indications: pain Details: 22 G needle, superolateral approach  Arthrogram: No  Medications: 88 mg Hyaluronan 88 MG/4ML Outcome: tolerated well, no immediate complications Patient was prepped and draped in the usual sterile fashion.

## 2018-03-06 MED FILL — PANTOPRAZOLE SOD DR 40 MG T: 40 | 90 days supply | Qty: 90 | Fill #2

## 2018-03-13 MED FILL — OXYCODONE-ACETAMINOPHEN 5-3: 5-325 | 7 days supply | Qty: 14 | Fill #0

## 2018-03-19 ENCOUNTER — Telehealth: Payer: Self-pay | Admitting: *Deleted

## 2018-03-19 NOTE — Telephone Encounter (Signed)
Took call from phone room. Spoke with Diego at [employer]. He wanted to know if pt has been scheduled for ocrevus infusions. They see she is in pre-determination for medication. Phone: [phone]. Ext [phone].  I placed him on hold. Verified with Otila Kluver PA approved and they need to schedule her. Pt can call Otila Kluver to schedule or Otila Kluver will call. He will pass this along to pt. Nothing further needed.   Pt JO#[id number].

## 2018-03-25 ENCOUNTER — Telehealth: Payer: Self-pay | Admitting: *Deleted

## 2018-03-25 MED ORDER — METHYLPREDNISOLONE 4 MG PO TABS: ORAL_TABLET | ORAL | 0 refills | Status: DC

## 2018-03-25 MED FILL — METHYLPREDNISOLONE 4 MG TAB: 4 | 6 days supply | Qty: 21 | Fill #0

## 2018-03-25 NOTE — Telephone Encounter (Signed)
Called, LVM for pt to call 

## 2018-03-25 NOTE — Telephone Encounter (Signed)
Spoke with Dr. Felecia Shelling-- he would like pt to take medrol dose pack 4mg -start taking 2 days prior to infusion. Directions: take 6 tablets first day and taper down by 1 tablet until done with medication. I will call pt

## 2018-03-25 NOTE — Telephone Encounter (Signed)
[name] with intrafusion stated pt scheduled for next Ocrevus infusion 04/03/18. Wants to know if she should take steroids prior to infusion d/t hx of infusions reactions.

## 2018-03-25 NOTE — Telephone Encounter (Signed)
Pt returning RN's call.

## 2018-03-25 NOTE — Telephone Encounter (Signed)
Spoke with Claiborne Billings and explained RAS would like her to start Medrol dose pk. 2 days prior to Ocrevus infusion next week. She is agreeable.  Rx. escribed to [facility]

## 2018-04-03 DIAGNOSIS — G35 Multiple sclerosis: Secondary | ICD-10-CM | POA: Diagnosis not present

## 2018-04-21 MED FILL — FLUoxetine HCL 20 MG CAPS: 20 | 90 days supply | Qty: 270 | Fill #1

## 2018-04-28 ENCOUNTER — Telehealth (HOSPITAL_COMMUNITY): Payer: Self-pay

## 2018-04-28 DIAGNOSIS — F900 Attention-deficit hyperactivity disorder, predominantly inattentive type: Secondary | ICD-10-CM

## 2018-04-28 MED ORDER — LISDEXAMFETAMINE DIMESYLATE 60 MG PO CAPS: 60.0000 mg | ORAL_CAPSULE | ORAL | 0 refills | Status: DC

## 2018-04-28 MED FILL — VYVANSE 60 MG CAPSULE: 60 | 90 days supply | Qty: 90 | Fill #0

## 2018-04-28 NOTE — Telephone Encounter (Signed)
Prescription of Vyvanse called into her pharmacy.

## 2018-04-28 NOTE — Telephone Encounter (Signed)
Patient calling for a refill on her Vyvanse, she has one day left. Patient follows up next week. She uses [facility]

## 2018-04-30 ENCOUNTER — Encounter: Payer: Self-pay | Admitting: Neurology

## 2018-04-30 ENCOUNTER — Other Ambulatory Visit: Payer: Self-pay

## 2018-04-30 ENCOUNTER — Ambulatory Visit (INDEPENDENT_AMBULATORY_CARE_PROVIDER_SITE_OTHER): Payer: 59 | Admitting: Neurology

## 2018-04-30 VITALS — BP 136/85 | HR 79 | Resp 18 | Ht 67.5 in | Wt 180.0 lb

## 2018-04-30 DIAGNOSIS — R260 Ataxic gait: Secondary | ICD-10-CM | POA: Diagnosis not present

## 2018-04-30 DIAGNOSIS — R350 Frequency of micturition: Secondary | ICD-10-CM | POA: Diagnosis not present

## 2018-04-30 DIAGNOSIS — Z79899 Other long term (current) drug therapy: Secondary | ICD-10-CM | POA: Diagnosis not present

## 2018-04-30 DIAGNOSIS — G35 Multiple sclerosis: Secondary | ICD-10-CM

## 2018-04-30 NOTE — Progress Notes (Signed)
[facility]  PATIENT: [name] DOB: Mar 31, 1961     HISTORICAL  CHIEF COMPLAINT:  Chief Complaint  Patient presents with  . Multiple Sclerosis    Rm. 13.  Had a 2nd rxn to [place] during her infusion 3 wks. ago. (itching in throat, nausea, congestion). Here to discuss other tx. options/fim    HISTORY OF PRESENT ILLNESS:  [name] is a 57 y.o.woman with multiple sclerosis.   Update 04/30/2018: She had another Ocrevus infusion and we pre-medicated with prednisone due to a reaction the previous infusion.   She had chest congestion and itching this time despite slowing the infusion down.    Benadryl helped.  She got in only 1/3 of the infusion.    We discussed options.   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   In the past she was on Betaseron.     Gait is doing well.  She had recent knee surgery so is actually better.   No new numbness or weakness,  No falls.     Bladder issues are worse even on Myrbetriq 50 mg with oxybutynin XR 10 mg.   She has baseline fatigue, worse in the early evening.    She sleeps well most nights but some nights has trouble falling and staying asleep.   Mood is doing well.  Cognition is doing well.    Update 10/29/2017: She feels her MS has been stable but she is having more inflammatory issues.   She had her second Ocrevus infusion April 2019 and started October 2018.    She had a possible allergic reaction with the last Ocrevus (itchy, scratchy throat, helped by Benadryl, back to baseline the next day).   She felt the right leg weakness improved but numbness persisted after the last exacerbation.     She has urinary urgency and incontinence.     She is noting more fatigue in the last year.  Of note, she is on Vyvanse 60 mg daily.  She feels she gets a benefit for about 12 hours but when it starts to wear off she quickly becomes more tired.  She is waking up 2-3 times a night due  to nocturia and sometimes has trouble falling back asleep.  She had left elbow bursitis with swelling.    She has had fluid around the left knee (MRI results below)  IMPRESSION: 1. Prominent degeneration of the posterior horn of the medial meniscus and mild peripheral extrusion of the meniscus from the joint. No discrete meniscal tear demonstrated. 2. 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 Associated moderate patellofemoral degenerative changes. 3. Milder medial compartment degenerative changes with reactive edema centrally in the proximal tibia. 4. Moderate-sized joint effusion with evidence of synovitis. 5. Intact lateral meniscus, cruciate and collateral ligaments.    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 She was stable on brand-name Copaxone the insurance company made her go to generic this year.  Yesterday, she had the onset of right leg weakness and is even weaker this morning. She went to work but is having difficulty and called our office concerned about an exacerbation. The weakness is only in the right leg. Specifically she does not note any right arm or left leg weakness. There is no new weakness in the face. There is no  numbness in the limbs or face.   She is having trouble walking due to the weakness. Her is a significant right foot drop.  While getting IV Solu-Medrol, she developed a rash and a dry cough she did not report any shortness of breath and vital signs remained stable. She transiently improved with 25 mg IV Benadryl and improved further with an additional 25 mg of Benadryl and IV Pepcid.    ROS:  Out of a complete 14 system review of symptoms, the patient complains only of the following symptoms, and all other reviewed systems are negative.  She has some fatigue.   She has ADD.  Mild GERD. Mild  depression improved on fluoxetine   ALLERGIES: Allergies  Allergen Reactions  . Methylprednisolone Hives  . Solu-Medrol [Methylprednisolone Acetate] Hives and Itching  . Cephalexin Hives and Itching    HOME MEDICATIONS:  Current Outpatient Medications:  .  diclofenac (VOLTAREN) 75 MG EC tablet, Take 1 tablet (75 mg total) by mouth 2 (two) times daily., Disp: 60 tablet, Rfl: 1 .  FLUoxetine (PROZAC) 20 MG capsule, Take 3 capsules daily, Disp: 270 capsule, Rfl: 3 .  FLUoxetine (PROZAC) 20 MG capsule, TAKE 3 CAPSULES (60 MG TOTAL) BY MOUTH DAILY, Disp: 270 capsule, Rfl: 4 .  lisdexamfetamine (VYVANSE) 60 MG capsule, Take 1 capsule (60 mg total) by mouth every morning., Disp: 90 capsule, Rfl: 0 .  mirabegron ER (MYRBETRIQ) 50 MG TB24 tablet, Take 1 tablet (50 mg total) by mouth daily., Disp: 90 tablet, Rfl: 4 .  Ocrelizumab (OCREVUS IV), Ocrevus, Disp: , Rfl:  .  oxybutynin (DITROPAN XL) 10 MG 24 hr tablet, Take 1 tablet (10 mg total) by mouth at bedtime., Disp: 90 tablet, Rfl: 3 .  pantoprazole (PROTONIX) 40 MG tablet, Take 40 mg by mouth daily., Disp: , Rfl:   PAST MEDICAL HISTORY: Past Medical History:  Diagnosis Date  . ADD (attention deficit disorder with hyperactivity)   . Anxiety   . Arthritis   . Cancer ([place])    Skin cancer- basal cell, squamous cell  . Cervical dysplasia   . GERD (gastroesophageal reflux disease)   . Headache    migraine  . Hepatitis    Pt states Core and surface antibioties for Hepatitis whens he was 19  . Mitral valve prolapse   . Multiple sclerosis ([place])   . Multiple sclerosis ([place])   . Tendonitis in rigtht wrist     PAST SURGICAL HISTORY: Past Surgical History:  Procedure Laterality Date  . ABDOMINAL HYSTERECTOMY  2004   ovaries retained  . BACK SURGERY    . heart ablation     15 yrs ago for a fib by Dr.Klein, everything ok  . KNEE ARTHROSCOPY WITH SUBCHONDROPLASTY Left 11/13/2017   Procedure: LEFT KNEE ARTHROSCOPY WITH SUBCHONDROPLASTY,  PARTIAL MEDIAL MENISCECTOMY, SYNOVECTOMY;  Surgeon: Leandrew Koyanagi, MD;  Location: [facility];  Service: Orthopedics;  Laterality: Left;  . TUBAL LIGATION      FAMILY HISTORY: Family History  Problem Relation Age of Onset  . Depression Mother   . Stroke Mother   . Heart disease Mother   . Dementia Mother   . Cancer Neg Hx   . Diabetes Neg Hx     SOCIAL HISTORY:  Social History   Socioeconomic History  . Marital status: Divorced    Spouse name: Not on file  . Number of children: Not on file  . Years of education: Not on file  . Highest education level: Not  on file  Occupational History  . Not on file  Social Needs  . Financial resource strain: Not on file  . Food insecurity:    Worry: Not on file    Inability: Not on file  . Transportation needs:    Medical: Not on file    Non-medical: Not on file  Tobacco Use  . Smoking status: Current Some Day Smoker    Packs/day: 0.25    Types: Cigarettes  . Smokeless tobacco: Never Used  Substance and Sexual Activity  . Alcohol use: Yes    Alcohol/week: 0.0 standard drinks    Comment: Occasioanl glass of wine   . Drug use: No  . Sexual activity: Yes    Birth control/protection: Surgical  Lifestyle  . Physical activity:    Days per week: Not on file    Minutes per session: Not on file  . Stress: Not on file  Relationships  . Social connections:    Talks on phone: Not on file    Gets together: Not on file    Attends religious service: Not on file    Active member of club or organization: Not on file    Attends meetings of clubs or organizations: Not on file    Relationship status: Not on file  . Intimate partner violence:    Fear of current or ex partner: Not on file    Emotionally abused: Not on file    Physically abused: Not on file    Forced sexual activity: Not on file  Other Topics Concern  . Not on file  Social History Narrative  . Not on file     PHYSICAL EXAM   BP 136/85   Pulse 79    Resp 18   Ht 5' 7.5" (1.715 m)   Wt 180 lb (81.6 kg)   BMI 27.78 kg/m    General: The patient is well-developed and well-nourished and in no acute distress  INeurologic Exam  Mental status: The patient is alert and oriented x 3 at the time of the examination. The patient has apparent normal recent and remote memory, with an apparently normal attention span and concentration ability.   Speech is normal.  Cranial nerves: Extraocular movements are full.  Facial strength and sensation was normal.  Trapezius strength is normal.  The tongue is midline, and the patient has symmetric elevation of the soft palate. No obvious hearing deficits are noted.  Motor:  Muscle bulk is normal.  Muscle tone is increased in the legs.  Reduced strength in the right leg.  Sensory: Sensory testing is intact to touch 4 .  Coordination: Cerebellar testing reveals good finger-nose-finger .  Heel-to-shin is reduced in the right leg.  Gait and station: Station is normal.   Gait shows a slight right foot drop.     She has difficulty doing a tandem walk.  Reflexes: Deep tendon reflexes are symmetric and normal bilaterally.     DIAGNOSTIC DATA (LABS, IMAGING, TESTING) - I reviewed patient records, labs, notes, testing and imaging myself where available.  Lab Results  Component Value Date   WBC 7.0 10/29/2017   HGB 12.6 10/29/2017   HCT 38.1 10/29/2017   MCV 86 10/29/2017   PLT 298 10/29/2017      Component Value Date/Time   NA 141 08/15/2015 1616   K 4.7 08/15/2015 1616   CL 101 08/15/2015 1616   CO2 26 08/15/2015 1616   GLUCOSE 90 08/15/2015 1616   BUN 19 08/15/2015 1616  CREATININE 0.54 (L) 08/15/2015 1616   CALCIUM 9.2 08/15/2015 1616   PROT 6.5 01/10/2017 1558   ALBUMIN 4.0 01/10/2017 1558   AST 17 01/10/2017 1558   ALT 18 01/10/2017 1558   ALKPHOS 94 01/10/2017 1558   BILITOT <0.2 01/10/2017 1558   GFRNONAA 107 08/15/2015 1616   GFRAA 124 08/15/2015 1616       ASSESSMENT AND  PLAN  MS (multiple sclerosis) (HCC) - Plan: CBC with Differential/Platelet, CD19 and CD20, Flow Cytometry, Stratify JCV Antibody Test ([facility])  High risk medication use - Plan: CBC with Differential/Platelet, CD19 and CD20, Flow Cytometry, Stratify JCV Antibody Test ([facility])  Urinary frequency  Ataxic gait  1.   Change ocrelizumab to Vumerity.  Check blood work.   2.   Oxybutynin and Myrbetriq for urinary urge incontinence.   Consider referral to urology if symptoms do not improve for consideration of Botox 3.    She will return to see [place] in 4 to 6 months or sooner if any new or worsening neurologic symptoms.   Richard A. Felecia Shelling, MD, PhD, Charlynn Grimes 79/07/8331, 8:32 PM Certified in Neurology, Clinical Neurophysiology, Sleep Medicine, Pain Medicine and Neuroimaging  [facility] [address], [place], [place] [address] [phone]

## 2018-05-03 LAB — CBC WITH DIFFERENTIAL/PLATELET
Basophils Absolute: 0.1 10*3/uL (ref 0.0–0.2)
Basos: 1 %
EOS (ABSOLUTE): 0.4 10*3/uL (ref 0.0–0.4)
Eos: 4 %
Hematocrit: 41.8 % (ref 34.0–46.6)
Hemoglobin: 13.6 g/dL (ref 11.1–15.9)
Immature Grans (Abs): 0 10*3/uL (ref 0.0–0.1)
Immature Granulocytes: 0 %
Lymphocytes Absolute: 3.6 10*3/uL — ABNORMAL HIGH (ref 0.7–3.1)
Lymphs: 34 %
MCH: 28 pg (ref 26.6–33.0)
MCHC: 32.5 g/dL (ref 31.5–35.7)
MCV: 86 fL (ref 79–97)
Monocytes Absolute: 0.9 10*3/uL (ref 0.1–0.9)
Monocytes: 8 %
Neutrophils Absolute: 5.7 10*3/uL (ref 1.4–7.0)
Neutrophils: 53 %
Platelets: 371 10*3/uL (ref 150–450)
RBC: 4.86 x10E6/uL (ref 3.77–5.28)
RDW: 13 % (ref 12.3–15.4)
WBC: 10.7 10*3/uL (ref 3.4–10.8)

## 2018-05-03 LAB — CD19 AND CD20, FLOW CYTOMETRY
% CD19: NEGATIVE %
% CD20: NEGATIVE %

## 2018-05-05 ENCOUNTER — Encounter (HOSPITAL_COMMUNITY): Payer: Self-pay | Admitting: Psychiatry

## 2018-05-05 ENCOUNTER — Ambulatory Visit (INDEPENDENT_AMBULATORY_CARE_PROVIDER_SITE_OTHER): Payer: 59 | Admitting: Psychiatry

## 2018-05-05 DIAGNOSIS — F900 Attention-deficit hyperactivity disorder, predominantly inattentive type: Secondary | ICD-10-CM | POA: Diagnosis not present

## 2018-05-05 MED ORDER — LISDEXAMFETAMINE DIMESYLATE 60 MG PO CAPS: 60.0000 mg | ORAL_CAPSULE | ORAL | 0 refills | Status: DC

## 2018-05-05 NOTE — Progress Notes (Signed)
[facility] MD/PA/NP OP Progress Note  05/05/2018 3:54 PM [name]  MRN:  [id number]  Chief Complaint: I am doing better.  Vyvanse helping my attention and focus.  HPI: Patient came for her follow-up appointment.  She is taking Vyvanse which is helping her attention, focus and multitasking.  She has no side effects including any tremors, insomnia, palpitation.  She is also getting Prozac 60 mg from her neurology.  Patient disappointed because currently she is not taking any medication for her MS.  Patient told she developed side effects and allergies from previous medication.  She is hoping that she can enroll in any research protocol with the help of neurology for new medication for her MS.  Overall she describes her mood is okay.  She is sleeping good.  She denies any crying spells, irritability, mania or any psychosis.  She is not drinking or using any illegal substances.  Her job is going well.  Her appetite is okay.  Her vital signs are stable.  Visit Diagnosis:    ICD-10-CM   1. Attention deficit hyperactivity disorder (ADHD), predominantly inattentive type F90.0 lisdexamfetamine (VYVANSE) 60 MG capsule    Past Psychiatric History: Reviewed. History of depression and ADD.  Tried Zoloft, Focalin, Ritalin and Strattera.  No history of inpatient or any suicidal attempt.  Past Medical History:  Past Medical History:  Diagnosis Date  . ADD (attention deficit disorder with hyperactivity)   . Anxiety   . Arthritis   . Cancer ([place])    Skin cancer- basal cell, squamous cell  . Cervical dysplasia   . GERD (gastroesophageal reflux disease)   . Headache    migraine  . Hepatitis    Pt states Core and surface antibioties for Hepatitis whens he was 45  . Mitral valve prolapse   . Multiple sclerosis ([place])   . Multiple sclerosis ([place])   . Tendonitis in rigtht wrist     Past Surgical History:  Procedure Laterality Date  . ABDOMINAL HYSTERECTOMY  2004   ovaries retained  . BACK SURGERY    .  heart ablation     15 yrs ago for a fib by Dr.Klein, everything ok  . KNEE ARTHROSCOPY WITH SUBCHONDROPLASTY Left 11/13/2017   Procedure: LEFT KNEE ARTHROSCOPY WITH SUBCHONDROPLASTY, PARTIAL MEDIAL MENISCECTOMY, SYNOVECTOMY;  Surgeon: Leandrew Koyanagi, MD;  Location: [facility];  Service: Orthopedics;  Laterality: Left;  . TUBAL LIGATION      Family Psychiatric History: Reviewed.  Family History:  Family History  Problem Relation Age of Onset  . Depression Mother   . Stroke Mother   . Heart disease Mother   . Dementia Mother   . Cancer Neg Hx   . Diabetes Neg Hx     Social History:  Social History   Socioeconomic History  . Marital status: Divorced    Spouse name: Not on file  . Number of children: Not on file  . Years of education: Not on file  . Highest education level: Not on file  Occupational History  . Not on file  Social Needs  . Financial resource strain: Not on file  . Food insecurity:    Worry: Not on file    Inability: Not on file  . Transportation needs:    Medical: Not on file    Non-medical: Not on file  Tobacco Use  . Smoking status: Current Some Day Smoker    Packs/day: 0.25    Types: Cigarettes  . Smokeless tobacco: Never Used  Substance and Sexual Activity  . Alcohol use: Yes    Alcohol/week: 0.0 standard drinks    Comment: Occasioanl glass of wine   . Drug use: No  . Sexual activity: Yes    Birth control/protection: Surgical  Lifestyle  . Physical activity:    Days per week: Not on file    Minutes per session: Not on file  . Stress: Not on file  Relationships  . Social connections:    Talks on phone: Not on file    Gets together: Not on file    Attends religious service: Not on file    Active member of club or organization: Not on file    Attends meetings of clubs or organizations: Not on file    Relationship status: Not on file  Other Topics Concern  . Not on file  Social History Narrative  . Not on file     Allergies:  Allergies  Allergen Reactions  . Methylprednisolone Hives  . Solu-Medrol [Methylprednisolone Acetate] Hives and Itching  . Cephalexin Hives and Itching    Metabolic Disorder Labs: No results found for: HGBA1C, MPG No results found for: PROLACTIN No results found for: CHOL, TRIG, HDL, CHOLHDL, VLDL, LDLCALC Lab Results  Component Value Date   TSH 1.220 10/29/2017    Therapeutic Level Labs: No results found for: LITHIUM No results found for: VALPROATE No components found for:  CBMZ  Current Medications: Current Outpatient Medications  Medication Sig Dispense Refill  . diclofenac (VOLTAREN) 75 MG EC tablet Take 1 tablet (75 mg total) by mouth 2 (two) times daily. 60 tablet 1  . FLUoxetine (PROZAC) 20 MG capsule Take 3 capsules daily 270 capsule 3  . FLUoxetine (PROZAC) 20 MG capsule TAKE 3 CAPSULES (60 MG TOTAL) BY MOUTH DAILY 270 capsule 4  . lisdexamfetamine (VYVANSE) 60 MG capsule Take 1 capsule (60 mg total) by mouth every morning. 90 capsule 0  . mirabegron ER (MYRBETRIQ) 50 MG TB24 tablet Take 1 tablet (50 mg total) by mouth daily. 90 tablet 4  . Ocrelizumab (OCREVUS IV) Ocrevus    . oxybutynin (DITROPAN XL) 10 MG 24 hr tablet Take 1 tablet (10 mg total) by mouth at bedtime. 90 tablet 3  . pantoprazole (PROTONIX) 40 MG tablet Take 40 mg by mouth daily.     No current facility-administered medications for this visit.      Musculoskeletal: Strength & Muscle Tone: within normal limits Gait & Station: normal Patient leans: N/A  Psychiatric Specialty Exam: ROS  Blood pressure 130/74, height 5' 7.5" (1.715 m), weight 180 lb (81.6 kg).Body mass index is 27.78 kg/m.  General Appearance: Casual  Eye Contact:  Good  Speech:  Clear and Coherent  Volume:  Normal  Mood:  Euthymic  Affect:  Appropriate  Thought Process:  Goal Directed  Orientation:  Full (Time, Place, and Person)  Thought Content: Logical   Suicidal Thoughts:  No  Homicidal Thoughts:   No  Memory:  Immediate;   Good Recent;   Good Remote;   Good  Judgement:  Good  Insight:  Good  Psychomotor Activity:  Normal  Concentration:  Concentration: Good and Attention Span: Good  Recall:  Good  Fund of Knowledge: Good  Language: Good  Akathisia:  No  Handed:  Right  AIMS (if indicated): not done  Assets:  Communication Skills Desire for Improvement Housing Resilience  ADL's:  Intact  Cognition: WNL  Sleep:  Good   Screenings: PHQ2-9     Office Visit from  03/13/2016 in [facility]  PHQ-2 Total Score  0       Assessment and Plan: Attention deficit disorder, inattentive type.  Major depressive disorder, recurrent.  Patient is a stable on current dose of Vyvanse.  She has no side effects.  She is also taking Prozac 60 mg from neurology.  Discussed medication side effects and benefits.  Recommended to call us back if she has any question or any concern.  Follow-up in 3 months.   Kathlee Nations, MD 05/05/2018, 3:54 PM

## 2018-05-06 ENCOUNTER — Telehealth: Payer: Self-pay | Admitting: *Deleted

## 2018-05-06 NOTE — Telephone Encounter (Signed)
JCV lab drawn on 04/30/18 positive. Titer: 1.58. Gave to Dr. Felecia Shelling to review.

## 2018-05-07 DIAGNOSIS — Z86010 Personal history of colonic polyps: Secondary | ICD-10-CM | POA: Diagnosis not present

## 2018-05-07 DIAGNOSIS — D125 Benign neoplasm of sigmoid colon: Secondary | ICD-10-CM | POA: Diagnosis not present

## 2018-05-07 DIAGNOSIS — K635 Polyp of colon: Secondary | ICD-10-CM | POA: Diagnosis not present

## 2018-05-07 DIAGNOSIS — K621 Rectal polyp: Secondary | ICD-10-CM | POA: Diagnosis not present

## 2018-05-07 DIAGNOSIS — D124 Benign neoplasm of descending colon: Secondary | ICD-10-CM | POA: Diagnosis not present

## 2018-05-07 DIAGNOSIS — K2950 Unspecified chronic gastritis without bleeding: Secondary | ICD-10-CM | POA: Diagnosis not present

## 2018-05-07 DIAGNOSIS — K449 Diaphragmatic hernia without obstruction or gangrene: Secondary | ICD-10-CM | POA: Diagnosis not present

## 2018-05-07 DIAGNOSIS — D123 Benign neoplasm of transverse colon: Secondary | ICD-10-CM | POA: Diagnosis not present

## 2018-05-07 DIAGNOSIS — K219 Gastro-esophageal reflux disease without esophagitis: Secondary | ICD-10-CM | POA: Diagnosis not present

## 2018-05-12 ENCOUNTER — Telehealth: Payer: Self-pay | Admitting: *Deleted

## 2018-05-12 NOTE — Telephone Encounter (Signed)
Faxed completed/signed vumerity start form to [place] at [phone]. Received fax confirmation.

## 2018-05-15 ENCOUNTER — Telehealth: Payer: Self-pay | Admitting: *Deleted

## 2018-05-15 NOTE — Telephone Encounter (Signed)
Received fax notification from [employer] that PA approved effective 05/13/2018-05/13/2019 for maximum of 12 refills. Approved for 4 capsules per day.

## 2018-05-15 NOTE — Telephone Encounter (Signed)
PA vumerity submitted on covermymeds.  Key: [place]. Waiting on determination from [employer].   Pt has tried/failed: Ocrevus, Gilenya, Betaseron, Aubagio, Copaxone. Dx code: G93

## 2018-06-05 ENCOUNTER — Telehealth: Payer: Self-pay | Admitting: Neurology

## 2018-06-05 DIAGNOSIS — Z0189 Encounter for other specified special examinations: Secondary | ICD-10-CM | POA: Diagnosis not present

## 2018-06-05 DIAGNOSIS — C44729 Squamous cell carcinoma of skin of left lower limb, including hip: Secondary | ICD-10-CM | POA: Diagnosis not present

## 2018-06-05 NOTE — Telephone Encounter (Signed)
I called patient back. Relayed message. She verbalized understanding and appreciation.

## 2018-06-05 NOTE — Telephone Encounter (Signed)
Spoke with Dr. Felecia Shelling- ok for pt to start medication now.

## 2018-06-05 NOTE — Telephone Encounter (Signed)
Pt states that the first delivery for her Diroximel Fumarate (VUMERITY PO) is today but she states that she was not told when she can start it but now the pt has a MOHS procedure scheduled in the next two weeks. Pt would like to know if she should wait till after the procedure is done to start the medication. Please advise.

## 2018-06-06 NOTE — Telephone Encounter (Signed)
Received fax notification from [place] that [place] shipped to pt 06/05/2018.

## 2018-06-24 MED FILL — PANTOPRAZOLE SOD DR 40 MG T: 40 | 90 days supply | Qty: 90 | Fill #3

## 2018-07-21 DIAGNOSIS — D485 Neoplasm of uncertain behavior of skin: Secondary | ICD-10-CM | POA: Diagnosis not present

## 2018-07-21 DIAGNOSIS — C44729 Squamous cell carcinoma of skin of left lower limb, including hip: Secondary | ICD-10-CM | POA: Diagnosis not present

## 2018-08-04 DIAGNOSIS — C44729 Squamous cell carcinoma of skin of left lower limb, including hip: Secondary | ICD-10-CM | POA: Diagnosis not present

## 2018-08-07 MED FILL — FLUoxetine HCL 20 MG CAPS: 20 | 90 days supply | Qty: 270 | Fill #2

## 2018-08-08 MED FILL — VYVANSE 60 MG CAPSULE: 60 | 90 days supply | Qty: 90 | Fill #0

## 2018-08-11 ENCOUNTER — Ambulatory Visit (HOSPITAL_COMMUNITY): Payer: 59 | Admitting: Psychiatry

## 2018-08-11 ENCOUNTER — Encounter (HOSPITAL_COMMUNITY): Payer: Self-pay | Admitting: Psychiatry

## 2018-08-11 ENCOUNTER — Other Ambulatory Visit: Payer: Self-pay

## 2018-08-11 DIAGNOSIS — Z818 Family history of other mental and behavioral disorders: Secondary | ICD-10-CM

## 2018-08-11 DIAGNOSIS — F17210 Nicotine dependence, cigarettes, uncomplicated: Secondary | ICD-10-CM

## 2018-08-11 DIAGNOSIS — F900 Attention-deficit hyperactivity disorder, predominantly inattentive type: Secondary | ICD-10-CM | POA: Diagnosis not present

## 2018-08-11 MED ORDER — LISDEXAMFETAMINE DIMESYLATE 60 MG PO CAPS: 60.0000 mg | ORAL_CAPSULE | ORAL | 0 refills | Status: DC

## 2018-08-11 NOTE — Progress Notes (Signed)
BH MD/PA/NP OP Progress Note  08/11/2018 3:43 PM [name]  MRN:  [id number]  Chief Complaint: I am doing fine.  I am taking my medication which is working for my attention and focus.  HPI: [name] came for her appointment.  She is taking Vyvanse 60 mg which is helping her focus, attention, organization and multitasking.  She has no side effects.  She is also taking Prozac 60 mg from her neurology.  She noticed since taking the new medication for her MS she has chronic GI issues and she lost 10 to 12 pounds.  She is afraid to stop the medication because she had tried numerous medication in the past with poor outcome.  Overall her symptoms are manageable.  She is sleeping okay.  She denies any irritability, anger, mania or any psychosis.  She denies drinking or using any illegal substances.  Her job is doing okay but lately due to pandemic coronavirus she is concerned as she takes immunosuppressant.  She like to continue her current medication.  Visit Diagnosis:    ICD-10-CM   1. Attention deficit hyperactivity disorder (ADHD), predominantly inattentive type F90.0 lisdexamfetamine (VYVANSE) 60 MG capsule    Past Psychiatric History: Reviewed. History of depression and ADD.  Tried Zoloft, Focalin, Ritalin and Strattera.  No history of inpatient or any suicidal attempt.  Past Medical History:  Past Medical History:  Diagnosis Date  . ADD (attention deficit disorder with hyperactivity)   . Anxiety   . Arthritis   . Cancer ([name])    Skin cancer- basal cell, squamous cell  . Cervical dysplasia   . GERD (gastroesophageal reflux disease)   . Headache    migraine  . Hepatitis    Pt states Core and surface antibioties for Hepatitis whens he was 71  . Mitral valve prolapse   . Multiple sclerosis ([place])   . [place]   . Tendonitis in rigtht wrist     Past Surgical History:  Procedure Laterality Date  . ABDOMINAL HYSTERECTOMY  2004   ovaries retained  . BACK SURGERY    .  heart ablation     15 yrs ago for a fib by Dr.Klein, everything ok  . KNEE ARTHROSCOPY WITH SUBCHONDROPLASTY Left 11/13/2017   Procedure: LEFT KNEE ARTHROSCOPY WITH SUBCHONDROPLASTY, PARTIAL MEDIAL MENISCECTOMY, SYNOVECTOMY;  Surgeon: Leandrew Koyanagi, MD;  Location: [facility];  Service: Orthopedics;  Laterality: Left;  . TUBAL LIGATION      Family Psychiatric History: Reviewed.  Family History:  Family History  Problem Relation Age of Onset  . Depression Mother   . Stroke Mother   . Heart disease Mother   . Dementia Mother   . Cancer Neg Hx   . Diabetes Neg Hx     Social History:  Social History   Socioeconomic History  . Marital status: Divorced    Spouse name: Not on file  . Number of children: Not on file  . Years of education: Not on file  . Highest education level: Not on file  Occupational History  . Not on file  Social Needs  . Financial resource strain: Not on file  . Food insecurity:    Worry: Not on file    Inability: Not on file  . Transportation needs:    Medical: Not on file    Non-medical: Not on file  Tobacco Use  . Smoking status: Current Some Day Smoker    Packs/day: 0.25    Types: Cigarettes  .  Smokeless tobacco: Never Used  Substance and Sexual Activity  . Alcohol use: Yes    Alcohol/week: 0.0 standard drinks    Comment: Occasioanl glass of wine   . Drug use: No  . Sexual activity: Yes    Birth control/protection: Surgical  Lifestyle  . Physical activity:    Days per week: Not on file    Minutes per session: Not on file  . Stress: Not on file  Relationships  . Social connections:    Talks on phone: Not on file    Gets together: Not on file    Attends religious service: Not on file    Active member of club or organization: Not on file    Attends meetings of clubs or organizations: Not on file    Relationship status: Not on file  Other Topics Concern  . Not on file  Social History Narrative  . Not on file     Allergies:  Allergies  Allergen Reactions  . Methylprednisolone Hives  . Solu-Medrol [Methylprednisolone Acetate] Hives and Itching  . Cephalexin Hives and Itching    Metabolic Disorder Labs: No results found for: HGBA1C, MPG No results found for: PROLACTIN No results found for: CHOL, TRIG, HDL, CHOLHDL, VLDL, LDLCALC Lab Results  Component Value Date   TSH 1.220 10/29/2017    Therapeutic Level Labs: No results found for: LITHIUM No results found for: VALPROATE No components found for:  CBMZ  Current Medications: Current Outpatient Medications  Medication Sig Dispense Refill  . diclofenac (VOLTAREN) 75 MG EC tablet Take 1 tablet (75 mg total) by mouth 2 (two) times daily. 60 tablet 1  . Diroximel Fumarate (VUMERITY PO) Take by mouth.    [name] FLUoxetine (PROZAC) 20 MG capsule TAKE 3 CAPSULES (60 MG TOTAL) BY MOUTH DAILY 270 capsule 4  . lisdexamfetamine (VYVANSE) 60 MG capsule Take 1 capsule (60 mg total) by mouth every morning. 90 capsule 0  . mirabegron ER (MYRBETRIQ) 50 MG TB24 tablet Take 1 tablet (50 mg total) by mouth daily. 90 tablet 4  . oxybutynin (DITROPAN XL) 10 MG 24 hr tablet Take 1 tablet (10 mg total) by mouth at bedtime. 90 tablet 3  . pantoprazole (PROTONIX) 40 MG tablet Take 40 mg by mouth daily.     No current facility-administered medications for this visit.      Musculoskeletal: Strength & Muscle Tone: within normal limits Gait & Station: normal Patient leans: N/A  Psychiatric Specialty Exam: ROS  Blood pressure 116/80, pulse 93, temperature 97.9 F (36.6 C), height 5\' 7"  (1.702 m), weight 175 lb (79.4 kg).Body mass index is 27.41 kg/m.  General Appearance: Casual  Eye Contact:  Good  Speech:  Clear and Coherent  Volume:  Normal  Mood:  Euphoric  Affect:  Congruent  Thought Process:  Goal Directed  Orientation:  Full (Time, Place, and Person)  Thought Content: Logical   Suicidal Thoughts:  No  Homicidal Thoughts:  No  Memory:   Immediate;   Good Recent;   Good Remote;   Good  Judgement:  Good  Insight:  Good  Psychomotor Activity:  Normal  Concentration:  Concentration: Good and Attention Span: Good  Recall:  Good  Fund of Knowledge: Good  Language: Good  Akathisia:  No  Handed:  Right  AIMS (if indicated): not done  Assets:  Communication Skills Desire for Improvement Housing Resilience Talents/Skills Transportation  ADL's:  Intact  Cognition: WNL  Sleep:  Fair   Screenings: PHQ2-9  Office Visit from 03/13/2016 in [place]  PHQ-2 Total Score  0       Assessment and Plan: Attention deficit disorder, inattentive type.  Major depressive disorder, recurrent.  Continue Vyvanse 60 mg daily.  Patient has no tremors shakes or any EPS.  She is getting Prozac 60 mg from neurology.  Discussed medication side effects and benefits.  Reassurance given as she is concerned about pandemic coronavirus.  She does not feel that she need any therapy at this time but promised to call us back if needed.  I recommend to call us back if she has any question.  I will see her again in 4 months.   Kathlee Nations, MD 08/11/2018, 3:43 PM

## 2018-08-25 ENCOUNTER — Telehealth: Payer: Self-pay | Admitting: Neurology

## 2018-08-25 NOTE — Telephone Encounter (Signed)
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  Please call

## 2018-08-26 ENCOUNTER — Telehealth: Payer: Self-pay | Admitting: Neurology

## 2018-08-26 NOTE — Telephone Encounter (Signed)
I had a discussion with her about her being on Vumerity and possible added risks during the COVID-19 crisis.  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    She will stop by tomorrow with the form for me to fill out.

## 2018-08-26 NOTE — Telephone Encounter (Signed)
I called to discuss her walking while on Vumerity.  I got her voicemail and left a message.  I will try to call her again soon.

## 2018-08-27 NOTE — Telephone Encounter (Signed)
Pt stopped by office and Dr. Felecia Shelling signed form. Form given back to pt. Copy made and sent to MR to be scanned.

## 2018-09-25 ENCOUNTER — Telehealth: Payer: Self-pay | Admitting: *Deleted

## 2018-09-25 NOTE — Telephone Encounter (Signed)
Called, LVM letting pt know we received fax notification from [facility] that they are unable to reach her by phone/mail to schedule next delivery of Vumerity. Asked her to call them back at [phone] at her earliest convenience.

## 2018-09-29 MED FILL — PANTOPRAZOLE SOD DR 40 MG T: 40 | 90 days supply | Qty: 90 | Fill #0

## 2018-11-26 MED FILL — FLUoxetine HCL 20 MG CAPS: 20 | 90 days supply | Qty: 270 | Fill #3

## 2018-11-27 MED FILL — VYVANSE 60 MG CAPSULE: 60 | 90 days supply | Qty: 90 | Fill #0

## 2018-12-11 ENCOUNTER — Other Ambulatory Visit: Payer: Self-pay

## 2018-12-11 ENCOUNTER — Encounter (HOSPITAL_COMMUNITY): Payer: Self-pay | Admitting: Psychiatry

## 2018-12-11 ENCOUNTER — Ambulatory Visit (INDEPENDENT_AMBULATORY_CARE_PROVIDER_SITE_OTHER): Payer: 59 | Admitting: Psychiatry

## 2018-12-11 VITALS — Wt 167.0 lb

## 2018-12-11 DIAGNOSIS — F900 Attention-deficit hyperactivity disorder, predominantly inattentive type: Secondary | ICD-10-CM

## 2018-12-11 DIAGNOSIS — F330 Major depressive disorder, recurrent, mild: Secondary | ICD-10-CM | POA: Diagnosis not present

## 2018-12-11 MED ORDER — LISDEXAMFETAMINE DIMESYLATE 60 MG PO CAPS: 60.0000 mg | ORAL_CAPSULE | ORAL | 0 refills | Status: DC

## 2018-12-11 NOTE — Progress Notes (Signed)
Virtual Visit via Telephone Note  I connected with [name] on 12/11/18 at  3:20 PM EDT by telephone and verified that I am speaking with the correct person using two identifiers.   I discussed the limitations, risks, security and privacy concerns of performing an evaluation and management service by telephone and the availability of in person appointments. I also discussed with the patient that there may be a patient responsible charge related to this service. The patient expressed understanding and agreed to proceed.   History of Present Illness: Patient was evaluated through phone session.  She is doing fine with the Vyvanse.  She is able to focus, pay attention and do multitasking.  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  However she is doing okay and try to keep herself busy at work.  Her emesis is stable and the new medicine is working however she had lost 10 pounds since she started.  She gets sometimes tired but denies any crying spells, anger, irritability or any feeling of hopelessness or worthlessness.  She is taking Prozac prescribed by neurology.  Patient told her brother and Baldo Ash were exposed to COVID-19 but their test was negative.  She admitted sometimes anxiety due to pandemic but she has no choice other than continue to work.  She denies drinking or using any illegal substances.  She takes immunosuppressants.  Patient like to continue her Vyvanse is helping her attention focus.  She has no tremors, palpitation, insomnia.   Past Psychiatric History: Reviewed. History of depression and ADD. Tried Zoloft,Focalin, Ritalin and Strattera. No history of inpatient or any suicidal attempt.  Psychiatric Specialty Exam: Physical Exam  ROS  There were no vitals taken for this visit.There is no height or weight on file to calculate BMI.  General Appearance: NA  Eye Contact:  NA  Speech:  Clear and Coherent and  Normal Rate  Volume:  Normal  Mood:  Dysphoric  Affect:  NA  Thought Process:  Goal Directed  Orientation:  Full (Time, Place, and Person)  Thought Content:  WDL and Logical  Suicidal Thoughts:  No  Homicidal Thoughts:  No  Memory:  Immediate;   Good Recent;   Good Remote;   Good  Judgement:  Good  Insight:  Good  Psychomotor Activity:  Normal  Concentration:  Concentration: Good and Attention Span: Good  Recall:  Good  Fund of Knowledge:  Good  Language:  Good  Akathisia:  No  Handed:  Right  AIMS (if indicated):     Assets:  Communication Skills Desire for Improvement Housing Resilience Social Support Talents/Skills Transportation  ADL's:  Intact  Cognition:  WNL  Sleep:         Assessment and Plan: Attention deficit disorder, inattentive type.  Major depressive disorder, recurrent.  Despite recent stress at work she is doing better.  She is getting Prozac 60 mg from neurology which is helping her depression.  I will continue Vyvanse 60 mg which is helping her focus, attention and multitasking.  Discussed medication side effects especially stimulant abuse withdrawal and dependency.  Recommended to call us back if she has any question or any concern.  Follow-up in 3 months.  Follow Up Instructions:    I discussed the assessment and treatment plan with the patient. The patient was provided an opportunity to ask questions and all were answered. The patient agreed with the plan and demonstrated an understanding of  the instructions.   The patient was advised to call back or seek an in-person evaluation if the symptoms worsen or if the condition fails to improve as anticipated.  I provided 20 minutes of non-face-to-face time during this encounter.   Kathlee Nations, MD

## 2019-01-13 MED FILL — PANTOPRAZOLE SOD DR 40 MG T: 40 | 90 days supply | Qty: 90 | Fill #0

## 2019-01-16 DIAGNOSIS — C44729 Squamous cell carcinoma of skin of left lower limb, including hip: Secondary | ICD-10-CM | POA: Diagnosis not present

## 2019-01-16 DIAGNOSIS — C44722 Squamous cell carcinoma of skin of right lower limb, including hip: Secondary | ICD-10-CM | POA: Diagnosis not present

## 2019-02-05 ENCOUNTER — Telehealth: Payer: Self-pay | Admitting: *Deleted

## 2019-02-05 NOTE — Telephone Encounter (Signed)
Called, LVM for pt to call office back today. Advised we close at 5pm today and are closed on Friday's.  If she calls, please schedule follow up for her to see Amy L,NP at soonest available. She was last seen 04/2018.   We also received fax from [facility] that they have been unable to reach her about refilling her Vumerity. She needs to call them back at [phone] at her earliest convenience. They are showing her last shipment was on 01/05/2019.

## 2019-02-10 NOTE — Telephone Encounter (Signed)
Pt called and spoke with Jael in phone room. She scheduled f/u with AL,NP 02/17/19

## 2019-02-10 NOTE — Telephone Encounter (Signed)
Called, LVM for pt to call office on [phone] Called mobile number [phone] and LVM for pt to call

## 2019-02-17 ENCOUNTER — Ambulatory Visit: Payer: 59 | Admitting: Family Medicine

## 2019-02-17 ENCOUNTER — Other Ambulatory Visit: Payer: Self-pay

## 2019-02-17 ENCOUNTER — Encounter: Payer: Self-pay | Admitting: Family Medicine

## 2019-02-17 VITALS — BP 115/74 | HR 79 | Temp 97.1°F | Ht 67.5 in | Wt 176.0 lb

## 2019-02-17 DIAGNOSIS — F3289 Other specified depressive episodes: Secondary | ICD-10-CM | POA: Diagnosis not present

## 2019-02-17 DIAGNOSIS — G35 Multiple sclerosis: Secondary | ICD-10-CM | POA: Diagnosis not present

## 2019-02-17 DIAGNOSIS — R350 Frequency of micturition: Secondary | ICD-10-CM

## 2019-02-17 DIAGNOSIS — R260 Ataxic gait: Secondary | ICD-10-CM

## 2019-02-17 NOTE — Progress Notes (Signed)
PATIENT: [name] DOB: [birth year]  REASON FOR VISIT: follow up HISTORY FROM: patient  Chief Complaint  Patient presents with   Follow-up    6 mon f/u. Alone. Rm 2. Patient mentioned that she has more weakness in her hands than normal.      HISTORY OF PRESENT ILLNESS: Today 02/17/19 Craig Guess Grau is a 58 y.o. female here today for follow up for MS. She was started on Vumerity in 05/2018. She states that she does not love this medication. She is having constant diarrhea. She has been incontinent of urine and stool for a couple of years.  She continues Ditropan and Mybetriq for urinary frequency. She does not feel that urology referral is something she is interested in at this time. She is working with psyciatry and continues Vyvanse. She is also taking Prozac 60mg  daily and feels mood is stable. She does not sleep well. She takes call at [facility] so is not comfortable taking a medication to help with sleep.  She has noted some increased weakness in bilateral hands.  She has more difficulty opening jars.  No new numbness.  No vision changes.  No changes in gait.  Last MRI in 12/2016 with concerns for progressive demyelination.     HISTORY: (copied from Dr Garth Bigness note on 04/30/2018)  Tanekia Rugel is a 58 y.o.woman with multiple sclerosis.   Update 04/30/2018: She had another Ocrevus infusion and we pre-medicated with prednisone due to a reaction the previous infusion.   She had chest congestion and itching this time despite slowing the infusion down.    Benadryl helped.  She got in only 1/3 of the infusion.    We discussed options.   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   In the past she was on Betaseron.     Gait is doing well.  She had recent knee surgery so is actually better.   No new numbness or weakness,  No falls.     Bladder issues are worse even on Myrbetriq 50 mg with oxybutynin XR 10 mg.   She has baseline  fatigue, worse in the early evening.    She sleeps well most nights but some nights has trouble falling and staying asleep.   Mood is doing well.  Cognition is doing well.    Update 10/29/2017: She feels her MS has been stable but she is having more inflammatory issues.   She had her second Ocrevus infusion April 2019 and started October 2018.    She had a possible allergic reaction with the last Ocrevus (itchy, scratchy throat, helped by Benadryl, back to baseline the next day).   She felt the right leg weakness improved but numbness persisted after the last exacerbation.     She has urinary urgency and incontinence.     She is noting more fatigue in the last year.  Of note, she is on Vyvanse 60 mg daily.  She feels she gets a benefit for about 12 hours but when it starts to wear off she quickly becomes more tired.  She is waking up 2-3 times a night due to nocturia and sometimes has trouble falling back asleep.  She had left elbow bursitis with swelling.    She has had fluid around the left knee (MRI results below)  IMPRESSION: 1. Prominent degeneration of the posterior horn of the medial meniscus and mild peripheral extrusion of the meniscus from the  joint. No discrete meniscal tear demonstrated. 2. 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 Associated moderate patellofemoral degenerative changes. 3. Milder medial compartment degenerative changes with reactive edema centrally in the proximal tibia. 4. Moderate-sized joint effusion with evidence of synovitis. 5. Intact lateral meniscus, cruciate and collateral ligaments.  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 She was stable on brand-name Copaxone the insurance company made her go to generic this year.  Yesterday, she had the onset of right leg weakness and is even weaker  this morning. She went to work but is having difficulty and called our office concerned about an exacerbation. The weakness is only in the right leg. Specifically she does not note any right arm or left leg weakness. There is no new weakness in the face. There is no numbness in the limbs or face.   She is having trouble walking due to the weakness. Her is a significant right foot drop.  While getting IV Solu-Medrol, she developed a rash and a dry cough she did not report any shortness of breath and vital signs remained stable. She transiently improved with 25 mg IV Benadryl and improved further with an additional 25 mg of Benadryl and IV Pepcid.   REVIEW OF SYSTEMS: Out of a complete 14 system review of symptoms, the patient complains only of the following symptoms, murmur, history of MVP, diarrhea, incontinence of bladder, weakness in bilateral hands and all other reviewed systems are negative.  ALLERGIES: Allergies  Allergen Reactions   Methylprednisolone Hives   Solu-Medrol [Methylprednisolone Acetate] Hives and Itching   Cephalexin Hives and Itching    HOME MEDICATIONS: Outpatient Medications Prior to Visit  Medication Sig Dispense Refill   Diroximel Fumarate (VUMERITY PO) Take by mouth.     FLUoxetine (PROZAC) 20 MG capsule TAKE 3 CAPSULES (60 MG TOTAL) BY MOUTH DAILY 270 capsule 4   lisdexamfetamine (VYVANSE) 60 MG capsule Take 1 capsule (60 mg total) by mouth every morning. 90 capsule 0   pantoprazole (PROTONIX) 40 MG tablet Take 40 mg by mouth daily.     diclofenac (VOLTAREN) 75 MG EC tablet Take 1 tablet (75 mg total) by mouth 2 (two) times daily. (Patient not taking: Reported on 02/17/2019) 60 tablet 1   mirabegron ER (MYRBETRIQ) 50 MG TB24 tablet Take 1 tablet (50 mg total) by mouth daily. (Patient not taking: Reported on 02/17/2019) 90 tablet 4   oxybutynin (DITROPAN XL) 10 MG 24 hr tablet Take 1 tablet (10 mg total) by mouth at bedtime. (Patient not taking: Reported on  02/17/2019) 90 tablet 3   VUMERITY 231 MG CPDR Take 231 mg by mouth 2 (two) times daily.     No facility-administered medications prior to visit.     PAST MEDICAL HISTORY: Past Medical History:  Diagnosis Date   ADD (attention deficit disorder with hyperactivity)    Anxiety    Arthritis    Cancer ([place])    Skin cancer- basal cell, squamous cell   Cervical dysplasia    GERD (gastroesophageal reflux disease)    Headache    migraine   Hepatitis    Pt states Core and surface antibioties for Hepatitis whens he was 18   Mitral valve prolapse    Multiple sclerosis (H[facility])    Multiple sclerosis (H[facility])    Tendonitis in rigtht wrist     PAST SURGICAL HISTORY: Past Surgical  History:  Procedure Laterality Date   ABDOMINAL HYSTERECTOMY  2004   ovaries retained   BACK SURGERY     heart ablation     15 yrs ago for a fib by Dr.Klein, everything ok   KNEE ARTHROSCOPY WITH SUBCHONDROPLASTY Left 11/13/2017   Procedure: LEFT KNEE ARTHROSCOPY WITH SUBCHONDROPLASTY, PARTIAL MEDIAL MENISCECTOMY, SYNOVECTOMY;  Surgeon: Leandrew Koyanagi, MD;  Location: [facility];  Service: Orthopedics;  Laterality: Left;   TUBAL LIGATION      FAMILY HISTORY: Family History  Problem Relation Age of Onset   Depression Mother    Stroke Mother    Heart disease Mother    Dementia Mother    Cancer Neg Hx    Diabetes Neg Hx     SOCIAL HISTORY: Social History   Socioeconomic History   Marital status: Divorced    Spouse name: Not on file   Number of children: Not on file   Years of education: Not on file   Highest education level: Not on file  Occupational History   Not on file  Social Needs   Financial resource strain: Not on file   Food insecurity    Worry: Not on file    Inability: Not on file   Transportation needs    Medical: Not on file    Non-medical: Not on file  Tobacco Use   Smoking status: Current Some Day Smoker    Packs/day: 0.25    Types:  Cigarettes   Smokeless tobacco: Never Used  Substance and Sexual Activity   Alcohol use: Yes    Alcohol/week: 0.0 standard drinks    Comment: Occasioanl glass of wine    Drug use: No   Sexual activity: Yes    Birth control/protection: Surgical  Lifestyle   Physical activity    Days per week: Not on file    Minutes per session: Not on file   Stress: Not on file  Relationships   Social connections    Talks on phone: Not on file    Gets together: Not on file    Attends religious service: Not on file    Active member of club or organization: Not on file    Attends meetings of clubs or organizations: Not on file    Relationship status: Not on file   Intimate partner violence    Fear of current or ex partner: Not on file    Emotionally abused: Not on file    Physically abused: Not on file    Forced sexual activity: Not on file  Other Topics Concern   Not on file  Social History Narrative   Not on file      PHYSICAL EXAM  Vitals:   02/17/19 1519  BP: 115/74  Pulse: 79  Temp: (!) 97.1 F (36.2 C)  TempSrc: Oral  Weight: 176 lb (79.8 kg)  Height: 5' 7.5" (1.715 m)   Body mass index is 27.16 kg/m.  Generalized: Well developed, in no acute distress  Neurological examination  Mentation: Alert oriented to time, place, history taking. Follows all commands speech and language fluent Cranial nerve II-XII: Pupils were equal round reactive to light. Extraocular movements were full, visual field were full on confrontational test. Facial sensation and strength were normal. Uvula tongue midline. Head turning and shoulder shrug  were normal and symmetric. Motor: The motor testing reveals 5 over 5 strength of all 4 extremities. Good symmetric motor tone is noted throughout.  Sensory: Sensory testing is intact to soft  touch on all 4 extremities. No evidence of extinction is noted.  Coordination: Cerebellar testing reveals good finger-nose-finger and heel-to-shin bilaterally.   Gait and station: Gait is mildly ataxic   DIAGNOSTIC DATA (LABS, IMAGING, TESTING) - I reviewed patient records, labs, notes, testing and imaging myself where available.  No flowsheet data found.   Lab Results  Component Value Date   WBC 10.7 04/30/2018   HGB 13.6 04/30/2018   HCT 41.8 04/30/2018   MCV 86 04/30/2018   PLT 371 04/30/2018      Component Value Date/Time   NA 141 08/15/2015 1616   K 4.7 08/15/2015 1616   CL 101 08/15/2015 1616   CO2 26 08/15/2015 1616   GLUCOSE 90 08/15/2015 1616   BUN 19 08/15/2015 1616   CREATININE 0.54 (L) 08/15/2015 1616   CALCIUM 9.2 08/15/2015 1616   PROT 6.5 01/10/2017 1558   ALBUMIN 4.0 01/10/2017 1558   AST 17 01/10/2017 1558   ALT 18 01/10/2017 1558   ALKPHOS 94 01/10/2017 1558   BILITOT <0.2 01/10/2017 1558   GFRNONAA 107 08/15/2015 1616   GFRAA 124 08/15/2015 1616   No results found for: CHOL, HDL, LDLCALC, LDLDIRECT, TRIG, CHOLHDL No results found for: HGBA1C No results found for: VITAMINB12 Lab Results  Component Value Date   TSH 1.220 10/29/2017       ASSESSMENT AND PLAN 58 y.o. year old female  has a past medical history of ADD (attention deficit disorder with hyperactivity), Anxiety, Arthritis, Cancer ([place]), Cervical dysplasia, GERD (gastroesophageal reflux disease), Headache, Hepatitis, Mitral valve prolapse, Multiple sclerosis ([place]), Multiple sclerosis ([place]), and Tendonitis in rigtht wrist. here with     ICD-10-CM   1. MS (multiple sclerosis) ([place])  G35 CBC with Differential/Platelets    CMP    MR BRAIN W WO CONTRAST  2. Urinary frequency  R35.0   3. Ataxic gait  R26.0   4. Other depression  F32.89     Overall Febbie is doing fairly well.  Although she has not been happy with side effects of Vumerity, she is hesitant to change medications at this time.  She has had multiple reactions and side effects from previous medications.  We have discussed changing DMT if she wishes.  For now she will continue Vumerity.   She will continue Ditropan and Myrbetriq for urinary concerns.  She is not interested in a referral to urology at this time.  She will follow-up with psychiatry.  We will continue Prozac for mood.  We will update labs today.  I will order updated MRI as well.  I am reassured that she is not having any new numbness.  We will monitor concerns of increased weakness in her hands.  We will also monitor concerns of insomnia.  She is not interested in taking any sleep aids at this time due to her work schedule.  To follow-up closely for any worsening of symptoms.  She will follow-up with Dr. Felecia Shelling in 6 months, sooner if needed.  She verbalizes understanding and agreement with this plan.   Orders Placed This Encounter  Procedures   MR BRAIN W WO CONTRAST    Standing Status:   Future    Standing Expiration Date:   04/18/2020    Order Specific Question:   If indicated for the ordered procedure, I authorize the administration of contrast media per Radiology protocol    Answer:   Yes    Order Specific Question:   What is the patient's sedation requirement?  Answer:   No Sedation    Order Specific Question:   Does the patient have a pacemaker or implanted devices?    Answer:   No    Order Specific Question:   Radiology Contrast Protocol - do NOT remove file path    Answer:   \[email or web address]    Order Specific Question:   Preferred imaging location?    Answer:   Internal   CBC with Differential/Platelets   CMP     No orders of the defined types were placed in this encounter.     I spent 45 minutes with the patient. 50% of this time was spent counseling and educating patient on plan of care and medications.    Debbora Presto, FNP-C 02/17/2019, 4:44 PM [facility] [address] [place], [place] [address] [phone]

## 2019-02-17 NOTE — Patient Instructions (Addendum)
Continue current treatment plan  Labs today, will order MRI   Follow up in 6 months   Multiple Sclerosis Multiple sclerosis (MS) is a disease of the brain, spinal cord, and optic nerves (central nervous system). It causes the body's disease-fighting (immune) system to destroy the protective covering (myelin sheath) around nerves in the brain. When this happens, signals (nerve impulses) going to and from the brain and spinal cord do not get sent properly or may not get sent at all. There are several types of MS:  Relapsing-remitting MS. This is the most common type. This causes sudden attacks of symptoms. After an attack, you may recover completely until the next attack, or some symptoms may remain permanently.  Secondary progressive MS. This usually develops after the onset of relapsing-remitting MS. Similar to relapsing-remitting MS, this type also causes sudden attacks of symptoms. Attacks may be less frequent, but symptoms slowly get worse (progress) over time.  Primary progressive MS. This causes symptoms that steadily progress over time. This type of MS does not cause sudden attacks of symptoms. The age of onset of MS varies, but it often develops between 10-59 years of age. MS is a lifelong (chronic) condition. There is no cure, but treatment can help slow down the progression of the disease. What are the causes? The cause of this condition is not known. What increases the risk? You are more likely to develop this condition if:  You are a woman.  You have a relative with MS. However, the condition is not passed from parent to child (inherited).  You have a lack (deficiency) of vitamin D.  You smoke. MS is more common in the [place] than in the [place]. What are the signs or symptoms? Relapsing-remitting and secondary progressive MS cause symptoms to occur in episodes or attacks that may last weeks to months. There may be long periods between attacks  in which there are almost no symptoms. Primary progressive MS causes symptoms to steadily progress after they develop. Symptoms of MS vary because of the many different ways it affects the central nervous system. The main symptoms include:  Vision problems and eye pain.  Numbness.  Weakness.  Inability to move your arms, hands, feet, or legs (paralysis).  Balance problems.  Shaking that you cannot control (tremors).  Muscle spasms.  Problems with thinking (cognitive changes). MS can also cause symptoms that are associated with the disease, but are not always the direct result of an MS attack. They may include:  Inability to control urination or bowel movements (incontinence).  Headaches.  Fatigue.  Inability to tolerate heat.  Emotional changes.  Depression.  Pain. How is this diagnosed? This condition is diagnosed based on:  Your symptoms.  A neurological exam. This involves checking central nervous system function, such as nerve function, reflexes, and coordination.  MRIs of the brain and spinal cord.  Lab tests, including a lumbar puncture that tests the fluid that surrounds the brain and spinal cord (cerebrospinal fluid).  Tests to measure the electrical activity of the brain in response to stimulation (evoked potentials). How is this treated? There is no cure for MS, but medicines can help decrease the number and frequency of attacks and help relieve nuisance symptoms. Treatment options may include:  Medicines that reduce the frequency of attacks. These medicines may be given by injection, by mouth (orally), or through an IV.  Medicines that reduce inflammation (steroids). These may provide short-term relief of symptoms.  Medicines to help  control pain, depression, fatigue, or incontinence.  Vitamin D, if you have a deficiency.  Using devices to help you move around (assistive devices), such as braces, a cane, or a walker.  Physical therapy to strengthen  and stretch your muscles.  Occupational therapy to help you with everyday tasks.  Alternative or complementary treatments such as exercise, massage, or acupuncture. Follow these instructions at home:  Take over-the-counter and prescription medicines only as told by your health care provider.  Do not drive or use heavy machinery while taking prescription pain medicine.  Use assistive devices as recommended by your physical therapist or your health care provider.  Exercise as directed by your health care provider.  Return to your normal activities as told by your health care provider. Ask your health care provider what activities are safe for you.  Reach out for support. Share your feelings with friends, family, or a support group.  Keep all follow-up visits as told by your health care provider and therapists. This is important. Where to find more information  National Multiple Sclerosis Society: https://www.nationalmssociety.org Contact a health care provider if:  You feel depressed.  You develop new pain or numbness.  You have tremors.  You have problems with sexual function. Get help right away if:  You develop paralysis.  You develop numbness.  You have problems with your bladder or bowel function.  You develop double vision.  You lose vision in one or both eyes.  You develop suicidal thoughts.  You develop severe confusion. If you ever feel like you may hurt yourself or others, or have thoughts about taking your own life, get help right away. You can go to your nearest emergency department or call:  Your local emergency services (911 in the [place]  A suicide crisis helpline, such as the [facility] at 419-551-5830. This is open 24 hours a day. Summary  Multiple sclerosis (MS) is a disease of the central nervous system that causes the body's immune system to destroy the protective covering (myelin sheath) around nerves in the brain.   There are 3 types of MS: relapsing-remitting, secondary progressive, and primary progressive. Relapsing-remitting and secondary progressive MS cause symptoms to occur in episodes or attacks that may last weeks to months. Primary progressive MS causes symptoms to steadily progress after they develop.  There is no cure for MS, but medicines can help decrease the number and frequency of attacks and help relieve nuisance symptoms. Treatment may also include physical or occupational therapy.  If you develop numbness, paralysis, vision problems, or other neurological symptoms, get help right away. This information is not intended to replace advice given to you by your health care provider. Make sure you discuss any questions you have with your health care provider. Document Released: 05/04/2000 Document Revised: 04/19/2017 Document Reviewed: 07/16/2016 Elsevier Patient Education  2020 [employer].

## 2019-02-18 ENCOUNTER — Telehealth: Payer: Self-pay

## 2019-02-18 LAB — CBC WITH DIFFERENTIAL/PLATELET
Basophils Absolute: 0.1 10*3/uL (ref 0.0–0.2)
Basos: 1 %
EOS (ABSOLUTE): 0.4 10*3/uL (ref 0.0–0.4)
Eos: 4 %
Hematocrit: 37.9 % (ref 34.0–46.6)
Hemoglobin: 12.4 g/dL (ref 11.1–15.9)
Immature Grans (Abs): 0 10*3/uL (ref 0.0–0.1)
Immature Granulocytes: 0 %
Lymphocytes Absolute: 4 10*3/uL — ABNORMAL HIGH (ref 0.7–3.1)
Lymphs: 40 %
MCH: 28.6 pg (ref 26.6–33.0)
MCHC: 32.7 g/dL (ref 31.5–35.7)
MCV: 87 fL (ref 79–97)
Monocytes Absolute: 0.9 10*3/uL (ref 0.1–0.9)
Monocytes: 9 %
Neutrophils Absolute: 4.5 10*3/uL (ref 1.4–7.0)
Neutrophils: 46 %
Platelets: 300 10*3/uL (ref 150–450)
RBC: 4.34 x10E6/uL (ref 3.77–5.28)
RDW: 13.2 % (ref 11.7–15.4)
WBC: 10 10*3/uL (ref 3.4–10.8)

## 2019-02-18 LAB — COMPREHENSIVE METABOLIC PANEL
ALT: 19 IU/L (ref 0–32)
AST: 16 IU/L (ref 0–40)
Albumin/Globulin Ratio: 2 (ref 1.2–2.2)
Albumin: 4.3 g/dL (ref 3.8–4.9)
Alkaline Phosphatase: 103 IU/L (ref 39–117)
BUN/Creatinine Ratio: 29 — ABNORMAL HIGH (ref 9–23)
BUN: 17 mg/dL (ref 6–24)
Bilirubin Total: <0.2 mg/dL (ref 0.0–1.2)
CO2: 26 mmol/L (ref 20–29)
Calcium: 9.6 mg/dL (ref 8.7–10.2)
Chloride: 99 mmol/L (ref 96–106)
Creatinine, Ser: 0.59 mg/dL (ref 0.57–1.00)
GFR calc Af Amer: 117 mL/min/{1.73_m2} (ref >59)
GFR calc non Af Amer: 101 mL/min/{1.73_m2} (ref >59)
Globulin, Total: 2.1 g/dL (ref 1.5–4.5)
Glucose: 84 mg/dL (ref 65–99)
Potassium: 4.4 mmol/L (ref 3.5–5.2)
Sodium: 139 mmol/L (ref 134–144)
Total Protein: 6.4 g/dL (ref 6.0–8.5)

## 2019-02-18 NOTE — Telephone Encounter (Signed)
LVM letting the patient know that her labs came back normal. Office number was provided in case she has any further questions.

## 2019-02-18 NOTE — Telephone Encounter (Signed)
-----   Message from Debbora Presto, NP sent at 02/18/2019  7:16 AM EDT ----- Labs looks ok!

## 2019-02-18 NOTE — Progress Notes (Signed)
I have read the note, and I agree with the clinical assessment and plan.  Richard A. Sater, MD, PhD, FAAN Certified in Neurology, Clinical Neurophysiology, Sleep Medicine, Pain Medicine and Neuroimaging  [facility] [address], [address] [place], [place] [address] [phone]  

## 2019-02-19 ENCOUNTER — Telehealth: Payer: Self-pay | Admitting: Family Medicine

## 2019-02-19 DIAGNOSIS — C44722 Squamous cell carcinoma of skin of right lower limb, including hip: Secondary | ICD-10-CM | POA: Diagnosis not present

## 2019-02-19 DIAGNOSIS — C44729 Squamous cell carcinoma of skin of left lower limb, including hip: Secondary | ICD-10-CM | POA: Diagnosis not present

## 2019-02-19 NOTE — Telephone Encounter (Signed)
[facility] order sent to GI. No auth they will reach out to the patient to schedule.  

## 2019-03-02 ENCOUNTER — Other Ambulatory Visit: Payer: Self-pay

## 2019-03-02 ENCOUNTER — Ambulatory Visit
Admission: RE | Admit: 2019-03-02 | Discharge: 2019-03-02 | Disposition: A | Discharge status: Home / Self Care | Payer: 59 | Source: Ambulatory Visit | Attending: Family Medicine | Admitting: Family Medicine

## 2019-03-02 DIAGNOSIS — G939 Disorder of brain, unspecified: Secondary | ICD-10-CM | POA: Diagnosis not present

## 2019-03-02 DIAGNOSIS — G35 Multiple sclerosis: Secondary | ICD-10-CM

## 2019-03-02 MED ORDER — GADOBENATE DIMEGLUMINE 529 MG/ML IV SOLN
15.0000 mL | Freq: Once | INTRAVENOUS | Status: AC | PRN
  Administered 2019-03-02: 15 mL via INTRAVENOUS

## 2019-03-09 ENCOUNTER — Encounter (HOSPITAL_COMMUNITY): Payer: Self-pay | Admitting: Psychiatry

## 2019-03-09 ENCOUNTER — Ambulatory Visit (INDEPENDENT_AMBULATORY_CARE_PROVIDER_SITE_OTHER): Payer: 59 | Admitting: Psychiatry

## 2019-03-09 ENCOUNTER — Other Ambulatory Visit: Payer: Self-pay

## 2019-03-09 DIAGNOSIS — F900 Attention-deficit hyperactivity disorder, predominantly inattentive type: Secondary | ICD-10-CM | POA: Diagnosis not present

## 2019-03-09 DIAGNOSIS — F330 Major depressive disorder, recurrent, mild: Secondary | ICD-10-CM

## 2019-03-09 MED ORDER — LISDEXAMFETAMINE DIMESYLATE 60 MG PO CAPS: 60.0000 mg | ORAL_CAPSULE | ORAL | 0 refills | Status: DC

## 2019-03-09 MED FILL — VYVANSE 60 MG CAPSULE: 60 | 90 days supply | Qty: 90 | Fill #0

## 2019-03-09 NOTE — Progress Notes (Signed)
Virtual Visit via Telephone Note  I connected with [name] on 03/09/19 at  3:20 PM EDT by telephone and verified that I am speaking with the correct person using two identifiers.   I discussed the limitations, risks, security and privacy concerns of performing an evaluation and management service by telephone and the availability of in person appointments. I also discussed with the patient that there may be a patient responsible charge related to this service. The patient expressed understanding and agreed to proceed.   History of Present Illness: Patient was evaluated through phone session.  Today she is sad and anxious because recent MRI shows that her current medicine for multiple sclerosis is not working.  She was very frustrated and disappointed.  She cannot take other medicine which she is sensitive and allergic.  She feels there are days when she is very sad but does feel the Vyvanse helping her focus and attention.  She also taking Prozac 60 mg prescribed by neurology Dr. Felecia Shelling.  She has not changed the dose and she realizes it is a situational depression and she will get over with it.  She is resilient and she has a hope.  She is sleeping okay.  She continues to work and so far she has not seen any neurological decline due to multiple sclerosis.  She has a plan to keep taking her current medicine.  She has no tremors, shakes or any EPS.  She denies any suicidal thoughts or any feeling of hopelessness or worthlessness.  Her dental level is okay.  Her appetite is okay.  She reported her weight is a stable.  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  She is not interested in therapy.  Past Psychiatric History:Reviewed. History of depression and ADD. Tried Zoloft,Focalin, Ritalin and Strattera. No history of inpatient or any suicidal attempt.  Psychiatric Specialty Exam: Physical Exam  ROS  There were no vitals taken for this visit.There is no height or weight on  file to calculate BMI.  General Appearance: NA  Eye Contact:  NA  Speech:  Clear and Coherent and Normal Rate  Volume:  Normal  Mood:  Anxious and Dysphoric  Affect:  NA  Thought Process:  Goal Directed  Orientation:  Full (Time, Place, and Person)  Thought Content:  Rumination  Suicidal Thoughts:  No  Homicidal Thoughts:  No  Memory:  Immediate;   Good Recent;   Good Remote;   Good  Judgement:  Good  Insight:  Good  Psychomotor Activity:  NA  Concentration:  Concentration: Good and Attention Span: Good  Recall:  Good  Fund of Knowledge:  Good  Language:  Good  Akathisia:  No  Handed:  Right  AIMS (if indicated):     Assets:  Communication Skills Desire for Improvement Housing Resilience  ADL's:  Intact  Cognition:  WNL  Sleep:        Assessment and Plan: Major depressive disorder, recurrent.  Attention deficit disorder, inattentive type.  Reassurance given about recent MRI finding that her current multiple sclerosis medicines are not working.  I offer counseling but patient declined.  She feels her current Vyvanse helping her focus, attention, multitasking and she is able to do her job.  She is also getting Prozac 60 mg from neurology it is helping her depression.  Discussed medication side effects and benefits.  Recommended to call us back if she has any question or any concern.  Follow-up in 3 months.  Follow Up Instructions:    I discussed the assessment and treatment plan with the patient. The patient was provided an opportunity to ask questions and all were answered. The patient agreed with the plan and demonstrated an understanding of the instructions.   The patient was advised to call back or seek an in-person evaluation if the symptoms worsen or if the condition fails to improve as anticipated.  I provided 20 minutes of non-face-to-face time during this encounter.   Kathlee Nations, MD

## 2019-03-11 ENCOUNTER — Encounter: Payer: Self-pay | Admitting: Family Medicine

## 2019-03-11 ENCOUNTER — Other Ambulatory Visit: Payer: Self-pay | Admitting: Family Medicine

## 2019-03-11 MED ORDER — FLUOXETINE HCL 20 MG PO CAPS: ORAL_CAPSULE | ORAL | 4 refills | Status: DC

## 2019-03-11 MED FILL — FLUoxetine HCL 20 MG CAPS: 20 | 90 days supply | Qty: 270 | Fill #0

## 2019-03-11 NOTE — Telephone Encounter (Signed)
Refill prozac

## 2019-03-18 ENCOUNTER — Other Ambulatory Visit: Payer: Self-pay | Admitting: *Deleted

## 2019-03-18 ENCOUNTER — Encounter: Payer: Self-pay | Admitting: Family Medicine

## 2019-03-18 DIAGNOSIS — G35 Multiple sclerosis: Secondary | ICD-10-CM

## 2019-03-18 DIAGNOSIS — R350 Frequency of micturition: Secondary | ICD-10-CM

## 2019-03-19 ENCOUNTER — Telehealth: Payer: Self-pay | Admitting: *Deleted

## 2019-03-19 NOTE — Telephone Encounter (Signed)
[phone].Received fax from [facility] about receiving discontiuation notification relating to pt vumerity.  I responded back to them that this was not discontinued.[phone], [phone].

## 2019-03-27 DIAGNOSIS — C44722 Squamous cell carcinoma of skin of right lower limb, including hip: Secondary | ICD-10-CM | POA: Diagnosis not present

## 2019-04-08 DIAGNOSIS — J069 Acute upper respiratory infection, unspecified: Secondary | ICD-10-CM | POA: Diagnosis not present

## 2019-04-08 DIAGNOSIS — Z20828 Contact with and (suspected) exposure to other viral communicable diseases: Secondary | ICD-10-CM | POA: Diagnosis not present

## 2019-04-13 ENCOUNTER — Encounter: Payer: Self-pay | Admitting: Family Medicine

## 2019-04-15 DIAGNOSIS — R350 Frequency of micturition: Secondary | ICD-10-CM | POA: Diagnosis not present

## 2019-04-15 DIAGNOSIS — N3946 Mixed incontinence: Secondary | ICD-10-CM | POA: Diagnosis not present

## 2019-04-15 DIAGNOSIS — R351 Nocturia: Secondary | ICD-10-CM | POA: Diagnosis not present

## 2019-04-15 DIAGNOSIS — N3942 Incontinence without sensory awareness: Secondary | ICD-10-CM | POA: Diagnosis not present

## 2019-05-01 DIAGNOSIS — C44729 Squamous cell carcinoma of skin of left lower limb, including hip: Secondary | ICD-10-CM | POA: Diagnosis not present

## 2019-05-01 DIAGNOSIS — D2372 Other benign neoplasm of skin of left lower limb, including hip: Secondary | ICD-10-CM | POA: Diagnosis not present

## 2019-05-05 MED FILL — PANTOPRAZOLE SOD DR 40 MG T: 40 | 90 days supply | Qty: 90 | Fill #0

## 2019-05-08 DIAGNOSIS — D485 Neoplasm of uncertain behavior of skin: Secondary | ICD-10-CM | POA: Diagnosis not present

## 2019-05-08 DIAGNOSIS — C44722 Squamous cell carcinoma of skin of right lower limb, including hip: Secondary | ICD-10-CM | POA: Diagnosis not present

## 2019-05-08 DIAGNOSIS — C44729 Squamous cell carcinoma of skin of left lower limb, including hip: Secondary | ICD-10-CM | POA: Diagnosis not present

## 2019-05-12 ENCOUNTER — Encounter: Payer: Self-pay | Admitting: Family Medicine

## 2019-05-12 MED ORDER — VUMERITY 231 MG PO CPDR: 462.0000 mg | DELAYED_RELEASE_CAPSULE | Freq: Two times a day (BID) | ORAL | 11 refills | Status: DC

## 2019-05-12 NOTE — Telephone Encounter (Signed)
Pt wanted a prescription faxed for vumerity [phone]. (there was no prescription to refill (as done on enrollment form for vumerity in 05/2018).

## 2019-05-12 NOTE — Telephone Encounter (Signed)
The [place] [employer] came up for the fax #.  I received fax confirmation for vumerity to this # per pt request. [phone].

## 2019-05-20 ENCOUNTER — Encounter: Payer: Self-pay | Admitting: Family Medicine

## 2019-05-25 ENCOUNTER — Telehealth: Payer: Self-pay

## 2019-05-25 NOTE — Telephone Encounter (Signed)
Please see 05/20/2019 Patient Message [name]/[name].  Received a approval fax for  Vumerity, Faxed to pharmacy.

## 2019-05-29 DIAGNOSIS — R350 Frequency of micturition: Secondary | ICD-10-CM | POA: Diagnosis not present

## 2019-05-29 DIAGNOSIS — R351 Nocturia: Secondary | ICD-10-CM | POA: Diagnosis not present

## 2019-05-29 DIAGNOSIS — N3946 Mixed incontinence: Secondary | ICD-10-CM | POA: Diagnosis not present

## 2019-06-09 ENCOUNTER — Encounter (HOSPITAL_COMMUNITY): Payer: Self-pay | Admitting: Psychiatry

## 2019-06-09 ENCOUNTER — Other Ambulatory Visit: Payer: Self-pay

## 2019-06-09 ENCOUNTER — Ambulatory Visit (INDEPENDENT_AMBULATORY_CARE_PROVIDER_SITE_OTHER): Payer: 59 | Admitting: Psychiatry

## 2019-06-09 DIAGNOSIS — F900 Attention-deficit hyperactivity disorder, predominantly inattentive type: Secondary | ICD-10-CM | POA: Diagnosis not present

## 2019-06-09 MED ORDER — LISDEXAMFETAMINE DIMESYLATE 60 MG PO CAPS: 60.0000 mg | ORAL_CAPSULE | ORAL | 0 refills | Status: DC

## 2019-06-09 MED FILL — VYVANSE 60 MG CAPSULE: 60 | 90 days supply | Qty: 90 | Fill #0

## 2019-06-09 NOTE — Progress Notes (Signed)
Virtual Visit via Telephone Note  I connected with [name] on 06/09/19 at  3:20 PM EST by telephone and verified that I am speaking with the correct person using two identifiers.   I discussed the limitations, risks, security and privacy concerns of performing an evaluation and management service by telephone and the availability of in person appointments. I also discussed with the patient that there may be a patient responsible charge related to this service. The patient expressed understanding and agreed to proceed.   History of Present Illness: Patient was evaluated by phone session.  She had good Christmas.  She was working too many hours and she is excited because she got the first Covid vaccine.  She continues to have MS symptoms but she is handling the symptoms much better than she anticipated.  She has urinary incontinent and now she had appointment to see urology if they can do something to help that symptoms.  She is sleeping good.  Her attention, focus is good.  She is able to do multitasking.  She is getting Prozac from neurology.  Her job is going well.  She has no tremors, shakes or any EPS.  She denies any crying spells or any feeling of hopelessness or worthlessness.  Her energy level is good.  Her appetite is okay.  She is not interested in therapy but like to continue Vyvanse which is helping her focus and energy.      Past Psychiatric History:Reviewed. H/O depression and ADD. Tried Zoloft,Focalin, Ritalin and Strattera. No history of inpatient or any suicidal attempt.    Psychiatric Specialty Exam: Physical Exam  Review of Systems  There were no vitals taken for this visit.There is no height or weight on file to calculate BMI.  General Appearance: NA  Eye Contact:  NA  Speech:  Clear and Coherent and Normal Rate  Volume:  Normal  Mood:  Euthymic  Affect:  NA  Thought Process:  Goal Directed  Orientation:  Full (Time, Place, and Person)  Thought Content:  WDL   Suicidal Thoughts:  No  Homicidal Thoughts:  No  Memory:  Immediate;   Good Recent;   Good Remote;   Good  Judgement:  Good  Insight:  Good  Psychomotor Activity:  NA  Concentration:  Concentration: Good and Attention Span: Good  Recall:  Good  Fund of Knowledge:  Good  Language:  Good  Akathisia:  No  Handed:  Right  AIMS (if indicated):     Assets:  Communication Skills Desire for Improvement Housing Resilience Social Support Talents/Skills Transportation  ADL's:  Intact  Cognition:  WNL  Sleep:   ok      Assessment and Plan: Attention deficit disorder, inattentive type.  Major depressive disorder, recurrent.  Patient doing better and handling her MS symptoms better than she anticipated.  She does not have any anxiety and she is resilience and like to continue current medication.  She has no side effects from Vyvanse.  I will continue Vyvanse 60 mg daily.  She is getting Prozac 60 mg from neurology which is helping her depression.  Discussed medication side effects and benefits.  Recommended to call us back if she has any question of any concern.  Follow-up in 3 months.  Follow Up Instructions:    I discussed the assessment and treatment plan with the patient. The patient was provided an opportunity to ask questions and all were answered. The patient agreed with the plan and demonstrated an understanding of the  instructions.   The patient was advised to call back or seek an in-person evaluation if the symptoms worsen or if the condition fails to improve as anticipated.  I provided 20 minutes of non-face-to-face time during this encounter.   Danielle Nations, MD

## 2019-06-11 ENCOUNTER — Other Ambulatory Visit: Payer: Self-pay | Admitting: Orthopedic Surgery

## 2019-06-11 ENCOUNTER — Other Ambulatory Visit: Payer: Self-pay

## 2019-06-11 ENCOUNTER — Ambulatory Visit (HOSPITAL_COMMUNITY)
Admission: RE | Admit: 2019-06-11 | Discharge: 2019-06-11 | Disposition: A | Discharge status: Home / Self Care | Payer: 59 | Source: Ambulatory Visit | Attending: Orthopedic Surgery | Admitting: Orthopedic Surgery

## 2019-06-11 DIAGNOSIS — M259 Joint disorder, unspecified: Secondary | ICD-10-CM

## 2019-06-11 DIAGNOSIS — M1711 Unilateral primary osteoarthritis, right knee: Secondary | ICD-10-CM | POA: Diagnosis not present

## 2019-06-11 NOTE — Progress Notes (Unsigned)
Pt called to say that she developed significant pain in her operative knee and couldn't bear weight while walking down a hallway today. Will check x-rays.    Lisette Abu, PA-C Orthopedic Surgery [phone]

## 2019-06-12 ENCOUNTER — Ambulatory Visit: Payer: 59 | Admitting: Orthopaedic Surgery

## 2019-06-12 ENCOUNTER — Encounter: Payer: Self-pay | Admitting: Orthopaedic Surgery

## 2019-06-12 VITALS — Ht 67.0 in | Wt 170.0 lb

## 2019-06-12 DIAGNOSIS — M1711 Unilateral primary osteoarthritis, right knee: Secondary | ICD-10-CM

## 2019-06-12 MED ORDER — LIDOCAINE HCL 1 % IJ SOLN
2.0000 mL | INTRAMUSCULAR | Status: AC | PRN
  Administered 2019-06-12: 2 mL

## 2019-06-12 MED ORDER — HYDROCODONE-ACETAMINOPHEN 5-325 MG PO TABS: 1.0000 | ORAL_TABLET | Freq: Two times a day (BID) | ORAL | 0 refills | Status: DC | PRN

## 2019-06-12 MED ORDER — BUPIVACAINE HCL 0.5 % IJ SOLN
2.0000 mL | INTRAMUSCULAR | Status: AC | PRN
  Administered 2019-06-12: 2 mL via INTRA_ARTICULAR

## 2019-06-12 MED FILL — HYDROCODON-APAP 5-325: 5-325 | 10 days supply | Qty: 20 | Fill #0

## 2019-06-12 NOTE — Progress Notes (Signed)
Office Visit Note   Patient: [name]           Date of Birth: June 09, 1960           MRN: [id number] Visit Date: 06/12/2019              Requested by: No referring provider defined for this encounter. PCP: Patient, No Pcp Per   Assessment & Plan: Visit Diagnoses:  1. Unilateral primary osteoarthritis, right knee     Plan: Impression is acute right lateral knee pain with questionable transient patellar dislocation vs lateral meniscus tear vs acute chrondral injury.  30 cc of blood was aspirated from the right knee today.  Called in [place] to take as needed.  We will place her in a knee immobilizer weightbearing as tolerated.  Crutches as needed.  Rest, ice and elevate as much as possible.  We will go ahead and obtain an MRI to rule out structural abnormalities.  Out of work note until MRI has been completed.  Follow-up with [place] once she has had the MRI.  Follow-Up Instructions: Return for after MRI.   Orders:  Orders Placed This Encounter  Procedures  . MR Knee Right w/o contrast   Meds ordered this encounter  Medications  . HYDROcodone-acetaminophen (NORCO) 5-325 MG tablet    Sig: Take 1 tablet by mouth 2 (two) times daily as needed for moderate pain.    Dispense:  20 tablet    Refill:  0      Procedures: Large Joint Inj: R knee on 06/12/2019 3:47 PM Indications: pain Details: 22 G needle  Arthrogram: No  Medications: 2 mL lidocaine 1 %; 2 mL bupivacaine 0.5 % Aspirate: 30 mL bloody Outcome: tolerated well, no immediate complications Consent was given by the patient. Patient was prepped and draped in the usual sterile fashion.       Clinical Data: No additional findings.   Subjective: Chief Complaint  Patient presents with  . Right Knee - Pain    HPI patient is a pleasant 59 year old female who presents our clinic today with acute right knee pain.  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  [name] she was walking down the hallway when she noticed an  acute twinge in pain to the right lateral knee.  She took a few more steps and had significantly increased pain to the point where she could not work the rest of the day.  Majority of her pain is to the lateral aspect.  She describes this as sharp shooting pain worse with bearing weight and trying to lift her leg to getting out of bed.  She also has pain with pivoting.  She has associated swelling as well.  She has been taking Tylenol and Advil without relief of symptoms.  She did note pins and needle sensations to the bottom of her foot yesterday but does have a history of MS.  X-rays obtained in the hospital yesterday were negative for fracture or other acute findings.  Review of Systems as detailed in HPI.  All others reviewed and are negative.   Objective: Vital Signs: Ht 5\' 7"  (1.702 m)   Wt 170 lb (77.1 kg)   BMI 26.63 kg/m   Physical Exam well-developed well-nourished female no acute distress.  Alert and oriented x3.  Ortho Exam examination of her right knee reveals medium effusion.  Range of motion 10 to 95 degrees with pain.  She has lateral knee pain that's worst near the lateral patellofemoral articulation.  Ligaments are stable.  She is neurovascular intact distally.  Specialty Comments:  No specialty comments available.  Imaging: No new imaging   PMFS History: Patient Active Problem List   Diagnosis Date Noted  . Unilateral primary osteoarthritis, left knee 02/12/2018  . Pain in left hip 01/28/2018  . 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 Acute medial meniscus tear 11/05/2017  . Synovitis of left knee 11/05/2017  . Insufficiency fracture of tibia 11/05/2017  . Effusion, left knee 10/23/2017  . Left knee pain 10/20/2017  . Right leg weakness 12/19/2016  . Acute upper respiratory infection 03/13/2016  . Cervical radiculitis 11/29/2015  . Numbness 11/29/2015  . Squamous cell carcinoma 05/25/2015  . Neck pain on right side 05/03/2015  . 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 Ataxic gait 11/10/2014  . High risk medication use 11/10/2014  . Urinary frequency 11/10/2014  . Right carpal tunnel syndrome 11/10/2014  . Influenza-like symptoms 02/03/2014  . Allergic rhinitis 09/14/2013  . ADD (attention deficit disorder) 03/10/2012  . Tobacco use 09/10/2011  . Visit for preventive health examination 09/10/2011  . MS (multiple sclerosis) ([place]) 09/04/2011   Past Medical History:  Diagnosis Date  . ADD (attention deficit disorder with hyperactivity)   . Anxiety   . Arthritis   . Cancer ([place])    Skin cancer- basal cell, squamous cell  . Cervical dysplasia   . GERD (gastroesophageal reflux disease)   . Headache    migraine  . Hepatitis    Pt states Core and surface antibioties for Hepatitis whens he was 87  . Mitral valve prolapse   . Multiple sclerosis ([place])   . Multiple sclerosis ([place])   . Tendonitis in rigtht wrist     Family History  Problem Relation Age of Onset  . Depression Mother   . Stroke Mother   . Heart disease Mother   . Dementia Mother   . Cancer Neg Hx   . Diabetes Neg Hx     Past Surgical History:  Procedure Laterality Date  . ABDOMINAL HYSTERECTOMY  2004   ovaries retained  . BACK SURGERY    . heart ablation     15 yrs ago for a fib by Dr.Klein, everything ok  . KNEE ARTHROSCOPY WITH SUBCHONDROPLASTY Left 11/13/2017   Procedure: LEFT KNEE ARTHROSCOPY WITH SUBCHONDROPLASTY, PARTIAL MEDIAL MENISCECTOMY, SYNOVECTOMY;  Surgeon: Leandrew Koyanagi, MD;  Location: [place];  Service: Orthopedics;  Laterality: Left;  . TUBAL LIGATION     Social History   Occupational History  . Not on file  Tobacco Use  . Smoking status: Current Some Day Smoker    Packs/day: 0.25    Types: Cigarettes  . Smokeless tobacco: Never Used  Substance and Sexual Activity  . Alcohol use: Yes    Alcohol/week: 0.0 standard drinks    Comment: Occasioanl glass of wine   . Drug use: No  . Sexual activity: Yes    Birth control/protection:  Surgical

## 2019-06-18 DIAGNOSIS — N3946 Mixed incontinence: Secondary | ICD-10-CM | POA: Diagnosis not present

## 2019-06-18 DIAGNOSIS — N3942 Incontinence without sensory awareness: Secondary | ICD-10-CM | POA: Diagnosis not present

## 2019-06-18 MED FILL — SOLIFENACIN SUCCINATE 5 MG: 5 | 30 days supply | Qty: 30 | Fill #0

## 2019-06-26 ENCOUNTER — Other Ambulatory Visit: Payer: Self-pay

## 2019-06-26 ENCOUNTER — Ambulatory Visit
Admission: RE | Admit: 2019-06-26 | Discharge: 2019-06-26 | Disposition: A | Discharge status: Home / Self Care | Payer: 59 | Source: Ambulatory Visit | Attending: Orthopaedic Surgery | Admitting: Orthopaedic Surgery

## 2019-06-26 DIAGNOSIS — M23321 Other meniscus derangements, posterior horn of medial meniscus, right knee: Secondary | ICD-10-CM | POA: Diagnosis not present

## 2019-06-26 DIAGNOSIS — M1711 Unilateral primary osteoarthritis, right knee: Secondary | ICD-10-CM

## 2019-06-30 ENCOUNTER — Encounter: Payer: Self-pay | Admitting: Orthopaedic Surgery

## 2019-06-30 ENCOUNTER — Other Ambulatory Visit: Payer: Self-pay

## 2019-06-30 ENCOUNTER — Ambulatory Visit: Payer: 59 | Admitting: Orthopaedic Surgery

## 2019-06-30 DIAGNOSIS — S83241A Other tear of medial meniscus, current injury, right knee, initial encounter: Secondary | ICD-10-CM

## 2019-06-30 DIAGNOSIS — M1711 Unilateral primary osteoarthritis, right knee: Secondary | ICD-10-CM

## 2019-06-30 MED ORDER — BUPIVACAINE HCL 0.5 % IJ SOLN
2.0000 mL | INTRAMUSCULAR | Status: AC | PRN
  Administered 2019-06-30: 2 mL via INTRA_ARTICULAR

## 2019-06-30 MED ORDER — METHYLPREDNISOLONE ACETATE 40 MG/ML IJ SUSP
40.0000 mg | INTRAMUSCULAR | Status: AC | PRN
  Administered 2019-06-30: 40 mg via INTRA_ARTICULAR

## 2019-06-30 MED ORDER — LIDOCAINE HCL 1 % IJ SOLN
2.0000 mL | INTRAMUSCULAR | Status: AC | PRN
  Administered 2019-06-30: 2 mL

## 2019-06-30 NOTE — Progress Notes (Signed)
Office Visit Note   Patient: [name]           Date of Birth: 22-Dec-1960           MRN: [id number] Visit Date: 06/30/2019              Requested by: No referring provider defined for this encounter. PCP: Patient, No Pcp Per   Assessment & Plan: Visit Diagnoses:  1. Acute medial meniscus tear of right knee, initial encounter   2. Primary osteoarthritis of right knee     Plan: Impression based on the MRI is a medial meniscal root tear and subchondral insufficiency fracture with tricompartmental DJD.  These findings were reviewed with Claiborne Billings in detail and based on discussion we will try a cortisone injection and work restrictions for the next 3 months.  If she does not receive any pain relief from the cortisone injection we could consider arthroscopic surgery and subchondroplasty.  We discussed that she will likely develop worse knee pain and DJD as a result of these findings and the knee replacement is a real possibility.  Follow-Up Instructions: Return in about 6 weeks (around 08/11/2019).   Orders:  Orders Placed This Encounter  Procedures  . Large Joint Inj   No orders of the defined types were placed in this encounter.     Procedures: Large Joint Inj: R knee on 06/30/2019 1:47 PM Indications: pain Details: 22 G needle  Arthrogram: No  Medications: 40 mg methylPREDNISolone acetate 40 MG/ML; 2 mL lidocaine 1 %; 2 mL bupivacaine 0.5 % Consent was given by the patient. Patient was prepped and draped in the usual sterile fashion.       Clinical Data: No additional findings.   Subjective: Chief Complaint  Patient presents with  . Right Knee - Pain, Follow-up    [name] returns today for MRI review of the right knee.  She feels a little bit better since we aspirated the right knee.   Review of Systems   Objective: Vital Signs: There were no vitals taken for this visit.  Physical Exam  Ortho Exam Right knee exam shows a small joint effusion.  Medial  joint line tenderness. Specialty Comments:  No specialty comments available.  Imaging: No results found.   PMFS History: Patient Active Problem List   Diagnosis Date Noted  . Acute medial meniscus tear of right knee 06/30/2019  . Primary osteoarthritis of right knee 06/30/2019  . Unilateral primary osteoarthritis, left knee 02/12/2018  . Pain in left hip 01/28/2018  . 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 Acute medial meniscus tear 11/05/2017  . Synovitis of left knee 11/05/2017  . Insufficiency fracture of tibia 11/05/2017  . Effusion, left knee 10/23/2017  . Left knee pain 10/20/2017  . Right leg weakness 12/19/2016  . Acute upper respiratory infection 03/13/2016  . Cervical radiculitis 11/29/2015  . Numbness 11/29/2015  . Squamous cell carcinoma 05/25/2015  . Neck pain on right side 05/03/2015  . 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 Ataxic gait 11/10/2014  . High risk medication use 11/10/2014  . Urinary frequency 11/10/2014  . Right carpal tunnel syndrome 11/10/2014  . Influenza-like symptoms 02/03/2014  . Allergic rhinitis 09/14/2013  . ADD (attention deficit disorder) 03/10/2012  . Tobacco use 09/10/2011  . Visit for preventive health examination 09/10/2011  . MS (multiple sclerosis) ([place]) 09/04/2011   Past Medical History:  Diagnosis Date  . ADD (attention deficit disorder with hyperactivity)   . Anxiety   . Arthritis   .  Cancer ([place])    Skin cancer- basal cell, squamous cell  . Cervical dysplasia   . GERD (gastroesophageal reflux disease)   . Headache    migraine  . Hepatitis    Pt states Core and surface antibioties for Hepatitis whens he was 31  . Mitral valve prolapse   . Multiple sclerosis ([place])   . Multiple sclerosis ([place])   . Tendonitis in rigtht wrist     Family History  Problem Relation Age of Onset  . Depression Mother   . Stroke Mother   . Heart disease Mother   . Dementia Mother   . Cancer Neg Hx   . Diabetes Neg Hx     Past Surgical  History:  Procedure Laterality Date  . ABDOMINAL HYSTERECTOMY  2004   ovaries retained  . BACK SURGERY    . heart ablation     15 yrs ago for a fib by Dr.Klein, everything ok  . KNEE ARTHROSCOPY WITH SUBCHONDROPLASTY Left 11/13/2017   Procedure: LEFT KNEE ARTHROSCOPY WITH SUBCHONDROPLASTY, PARTIAL MEDIAL MENISCECTOMY, SYNOVECTOMY;  Surgeon: Leandrew Koyanagi, MD;  Location: [place];  Service: Orthopedics;  Laterality: Left;  . TUBAL LIGATION     Social History   Occupational History  . Not on file  Tobacco Use  . Smoking status: Current Some Day Smoker    Packs/day: 0.25    Types: Cigarettes  . Smokeless tobacco: Never Used  Substance and Sexual Activity  . Alcohol use: Yes    Alcohol/week: 0.0 standard drinks    Comment: Occasioanl glass of wine   . Drug use: No  . Sexual activity: Yes    Birth control/protection: Surgical

## 2019-07-03 MED FILL — FLUoxetine HCL 20 MG CAPS: 20 | 90 days supply | Qty: 270 | Fill #1

## 2019-07-15 ENCOUNTER — Telehealth: Payer: Self-pay | Admitting: Orthopaedic Surgery

## 2019-07-15 NOTE — Telephone Encounter (Signed)
Returned to [employer] their fax asking for clarification if patient missed any work from 06/12/19-06/30/19. Dr. Phoebe Sharps ov note 1/22 state pt oow until comes in for MRI results. That appt was 06/30/19. Work notes from both appts faxed and clarified pt was oow during dates in question. [phone]

## 2019-07-23 DIAGNOSIS — C44722 Squamous cell carcinoma of skin of right lower limb, including hip: Secondary | ICD-10-CM | POA: Diagnosis not present

## 2019-07-28 MED FILL — SOLIFENACIN SUCCINATE 5 MG: 5 | 30 days supply | Qty: 30 | Fill #1

## 2019-08-05 DIAGNOSIS — N3942 Incontinence without sensory awareness: Secondary | ICD-10-CM | POA: Diagnosis not present

## 2019-08-05 DIAGNOSIS — N3946 Mixed incontinence: Secondary | ICD-10-CM | POA: Diagnosis not present

## 2019-08-05 MED FILL — SOLIFENACIN SUCCINATE 10 MG: 10 | 30 days supply | Qty: 30 | Fill #0

## 2019-08-11 ENCOUNTER — Encounter: Payer: Self-pay | Admitting: Neurology

## 2019-08-11 ENCOUNTER — Ambulatory Visit: Payer: 59 | Admitting: Neurology

## 2019-08-11 ENCOUNTER — Other Ambulatory Visit: Payer: Self-pay

## 2019-08-11 VITALS — BP 110/70 | HR 101 | Temp 97.4°F | Ht 67.0 in | Wt 178.8 lb

## 2019-08-11 DIAGNOSIS — R29898 Other symptoms and signs involving the musculoskeletal system: Secondary | ICD-10-CM

## 2019-08-11 DIAGNOSIS — Z79899 Other long term (current) drug therapy: Secondary | ICD-10-CM

## 2019-08-11 DIAGNOSIS — R350 Frequency of micturition: Secondary | ICD-10-CM | POA: Diagnosis not present

## 2019-08-11 DIAGNOSIS — E559 Vitamin D deficiency, unspecified: Secondary | ICD-10-CM

## 2019-08-11 DIAGNOSIS — G35 Multiple sclerosis: Secondary | ICD-10-CM | POA: Diagnosis not present

## 2019-08-11 NOTE — Progress Notes (Signed)
[facility]  PATIENT: [name] DOB: Mar 19, 1961     HISTORICAL  CHIEF COMPLAINT:  Chief Complaint  Patient presents with  . Follow-up    RM 16, alone. Last seen 02/17/2019  . Multiple Sclerosis    On Vumerity   . Urinary Frequency    Saw urologist. Changed her from ditropanmyrbetriq to [place]. Just increased to vesicare 10mg  last week. Helped for the first few days and now she does not feel its as effective.   . Mood    Takes prozac    HISTORY OF PRESENT ILLNESS:  [name] is a 59 y.o.woman with relapsing remitting multiple sclerosis.   Update 08/11/2019: She is now on Vumerity but is still having GI issues.   She was allergic to Ocrevus and had multiple skin cancers while on Gilenya (still has had some on other medications)  She is having bladder and bowel incontinence.   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    She has extreme urgency with both and can't always get to a facility in time.   Her problem is more at work where there is not a bathroom near by and is with patients.  She sleeps poorly at night due to nocturia.         Update 04/30/2018: She had another Ocrevus infusion and we pre-medicated with prednisone due to a reaction the previous infusion.   She had chest congestion and itching this time despite slowing the infusion down.    Benadryl helped.  She got in only 1/3 of the infusion.    We discussed options.   She went off Gilenya due to 2 squamous cell cancers and broke through [place] with a relapse.   In the past she was on Betaseron.     Gait is doing well.  She had recent knee surgery so is actually better.   No new numbness or weakness,  No falls.     Bladder issues are worse even on Myrbetriq 50 mg with oxybutynin XR 10 mg.   She has baseline fatigue, worse in the early evening.    She sleeps well most nights but some nights has trouble falling and  staying asleep.   Mood is doing well.  Cognition is doing well.    Update 10/29/2017: She feels her MS has been stable but she is having more inflammatory issues.   She had her second Ocrevus infusion April 2019 and started October 2018.    She had a possible allergic reaction with the last Ocrevus (itchy, scratchy throat, helped by Benadryl, back to baseline the next day).   She felt the right leg weakness improved but numbness persisted after the last exacerbation.     She has urinary urgency and incontinence.     She is noting more fatigue in the last year.  Of note, she is on Vyvanse 60 mg daily.  She feels she gets a benefit for about 12 hours but when it starts to wear off she quickly becomes more tired.  She is waking up 2-3 times a night due to nocturia and sometimes has trouble falling back asleep.   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 She was stable on brand-name Copaxone the insurance company made her go to generic this year.  Yesterday, she had the onset of right leg weakness and  is even weaker this morning. She went to work but is having difficulty and called our office concerned about an exacerbation. The weakness is only in the right leg. Specifically she does not note any right arm or left leg weakness. There is no new weakness in the face. There is no numbness in the limbs or face.   She is having trouble walking due to the weakness. Her is a significant right foot drop.  While getting IV Solu-Medrol, she developed a rash and a dry cough she did not report any shortness of breath and vital signs remained stable. She transiently improved with 25 mg IV Benadryl and improved further with an additional 25 mg of Benadryl and IV Pepcid.    ROS:  Out of a complete 14 system review of symptoms, the patient complains only of the following symptoms, and all other reviewed systems are negative.  She has some  fatigue.   She has ADD.  Mild GERD. Mild depression improved on fluoxetine   ALLERGIES: Allergies  Allergen Reactions  . Methylprednisolone Hives  . Solu-Medrol [Methylprednisolone Sodium Succ] Hives and Itching  . Cephalexin Hives and Itching    HOME MEDICATIONS:  Current Outpatient Medications:  [name]  Diroximel Fumarate (VUMERITY PO), Take by mouth., Disp: , Rfl:  .  FLUoxetine (PROZAC) 20 MG capsule, TAKE 3 CAPSULES (60 MG TOTAL) BY MOUTH DAILY, Disp: 270 capsule, Rfl: 4 .  lisdexamfetamine (VYVANSE) 60 MG capsule, Take 1 capsule (60 mg total) by mouth every morning., Disp: 90 capsule, Rfl: 0 .  pantoprazole (PROTONIX) 40 MG tablet, Take 40 mg by mouth daily., Disp: , Rfl:  .  solifenacin (VESICARE) 10 MG tablet, Take 10 mg by mouth daily., Disp: , Rfl:  .  VUMERITY 231 MG CPDR, Take 462 mg by mouth 2 (two) times daily., Disp: 120 capsule, Rfl: 11  PAST MEDICAL HISTORY: Past Medical History:  Diagnosis Date  . ADD (attention deficit disorder with hyperactivity)   . Anxiety   . Arthritis   . Cancer ([place])    Skin cancer- basal cell, squamous cell  . Cervical dysplasia   . GERD (gastroesophageal reflux disease)   . Headache    migraine  . Hepatitis    Pt states Core and surface antibioties for Hepatitis whens he was 39  . Mitral valve prolapse   . Multiple sclerosis ([place])   . Multiple sclerosis ([place])   . Tendonitis in rigtht wrist     PAST SURGICAL HISTORY: Past Surgical History:  Procedure Laterality Date  . ABDOMINAL HYSTERECTOMY  2004   ovaries retained  . BACK SURGERY    . heart ablation     15 yrs ago for a fib by Dr.Klein, everything ok  . KNEE ARTHROSCOPY WITH SUBCHONDROPLASTY Left 11/13/2017   Procedure: LEFT KNEE ARTHROSCOPY WITH SUBCHONDROPLASTY, PARTIAL MEDIAL MENISCECTOMY, SYNOVECTOMY;  Surgeon: Leandrew Koyanagi, MD;  Location: [facility];  Service: Orthopedics;  Laterality: Left;  . TUBAL LIGATION      FAMILY HISTORY: Family History    Problem Relation Age of Onset  . Depression Mother   . Stroke Mother   . Heart disease Mother   . Dementia Mother   . Cancer Neg Hx   . Diabetes Neg Hx     SOCIAL HISTORY:  Social History   Socioeconomic History  . Marital status: Divorced    Spouse name: Not on file  . Number of children: Not on file  . Years of education: Not on file  .  Highest education level: Not on file  Occupational History  . Not on file  Tobacco Use  . Smoking status: Current Some Day Smoker    Packs/day: 0.25    Types: Cigarettes  . Smokeless tobacco: Never Used  Substance and Sexual Activity  . Alcohol use: Yes    Alcohol/week: 0.0 standard drinks    Comment: Occasioanl glass of wine   . Drug use: No  . Sexual activity: Yes    Birth control/protection: Surgical  Other Topics Concern  . Not on file  Social History Narrative  . Not on file   Social Determinants of Health   Financial Resource Strain:   . Difficulty of Paying Living Expenses:   Food Insecurity:   . Worried About Charity fundraiser in the Last Year:   . Arboriculturist in the Last Year:   Transportation Needs:   . Film/video editor (Medical):   [name] Lack of Transportation (Non-Medical):   Physical Activity:   . Days of Exercise per Week:   . Minutes of Exercise per Session:   Stress:   . Feeling of Stress :   Social Connections:   . Frequency of Communication with Friends and Family:   . Frequency of Social Gatherings with Friends and Family:   . Attends Religious Services:   . Active Member of Clubs or Organizations:   . Attends Archivist Meetings:   [name] Marital Status:   Intimate Partner Violence:   . Fear of Current or Ex-Partner:   . Emotionally Abused:   [name] Physically Abused:   . Sexually Abused:      PHYSICAL EXAM   BP 110/70 (BP Location: Right Arm, Patient Position: Sitting, Cuff Size: Normal)   Pulse (!) 101   Temp (!) 97.4 F (36.3 C)   Ht 5\' 7"  (1.702 m)   Wt 178 lb 12.8 oz (81.1  kg)   SpO2 98%   BMI 28.00 kg/m    General: The patient is well-developed and well-nourished and in no acute distress  INeurologic Exam  Mental status: The patient is alert and oriented x 3 at the time of the examination. The patient has apparent normal recent and remote memory, with an apparently normal attention span and concentration ability.   Speech is normal.  Cranial nerves: Extraocular movements are full.  Facial strength is normal.  Trapezius strength is normal.  Hearing is normal.  Motor:  Muscle bulk is normal.  Muscle tone is increased in the legs.  She has reduced strength in the right leg relative to the left  Sensory: Sensory testing is intact to touch 4 .  Coordination: Cerebellar testing reveals good finger-nose-finger .  Heel-to-shin is reduced in the right leg.  Gait and station: Station is normal.   Gait shows a slight right foot drop.     Her tandem gait is poor.  Romberg is negative.  Reflexes: Deep tendon reflexes are symmetric and normal bilaterally.     DIAGNOSTIC DATA (LABS, IMAGING, TESTING) - I reviewed patient records, labs, notes, testing and imaging myself where available.  Lab Results  Component Value Date   WBC 10.0 02/17/2019   HGB 12.4 02/17/2019   HCT 37.9 02/17/2019   MCV 87 02/17/2019   PLT 300 02/17/2019      Component Value Date/Time   NA 139 02/17/2019 1559   K 4.4 02/17/2019 1559   CL 99 02/17/2019 1559   CO2 26 02/17/2019 1559   GLUCOSE 84 02/17/2019  1559   BUN 17 02/17/2019 1559   CREATININE 0.59 02/17/2019 1559   CALCIUM 9.6 02/17/2019 1559   PROT 6.4 02/17/2019 1559   ALBUMIN 4.3 02/17/2019 1559   AST 16 02/17/2019 1559   ALT 19 02/17/2019 1559   ALKPHOS 103 02/17/2019 1559   BILITOT <0.2 02/17/2019 1559   GFRNONAA 101 02/17/2019 1559   GFRAA 117 02/17/2019 1559       ASSESSMENT AND PLAN  MS (multiple sclerosis) ([place]) - Plan: CBC with Differential/Platelet, Comprehensive metabolic panel  High risk medication  use - Plan: CBC with Differential/Platelet, Comprehensive metabolic panel  Vitamin D deficiency - Plan: VITAMIN D 25 Hydroxy (Vit-D Deficiency, Fractures)  Right leg weakness  Urinary frequency  1.   Continue Vumerity.  Check blood work.   2.   She sees urology for incontinence.    She continues to have incontinence despite medications.  She will ask about Botox next visit.   3.    She will return to see [place] in 6 months or sooner if any new or worsening neurologic symptoms.   Muhamed Luecke A. Felecia Shelling, MD, PhD, Charlynn Grimes [id number], [id number] PM Certified in Neurology, Clinical Neurophysiology, Sleep Medicine, Pain Medicine and Neuroimaging  [facility] [address], [place], [place] [address] [phone]

## 2019-08-12 ENCOUNTER — Other Ambulatory Visit: Payer: Self-pay | Admitting: Neurology

## 2019-08-12 LAB — COMPREHENSIVE METABOLIC PANEL
ALT: 15 IU/L (ref 0–32)
AST: 14 IU/L (ref 0–40)
Albumin/Globulin Ratio: 2 (ref 1.2–2.2)
Albumin: 4.3 g/dL (ref 3.8–4.9)
Alkaline Phosphatase: 109 IU/L (ref 39–117)
BUN/Creatinine Ratio: 26 — ABNORMAL HIGH (ref 9–23)
BUN: 15 mg/dL (ref 6–24)
Bilirubin Total: <0.2 mg/dL (ref 0.0–1.2)
CO2: 27 mmol/L (ref 20–29)
Calcium: 9.5 mg/dL (ref 8.7–10.2)
Chloride: 100 mmol/L (ref 96–106)
Creatinine, Ser: 0.58 mg/dL (ref 0.57–1.00)
GFR calc Af Amer: 117 mL/min/{1.73_m2} (ref >59)
GFR calc non Af Amer: 102 mL/min/{1.73_m2} (ref >59)
Globulin, Total: 2.1 g/dL (ref 1.5–4.5)
Glucose: 89 mg/dL (ref 65–99)
Potassium: 4.2 mmol/L (ref 3.5–5.2)
Sodium: 139 mmol/L (ref 134–144)
Total Protein: 6.4 g/dL (ref 6.0–8.5)

## 2019-08-12 LAB — CBC WITH DIFFERENTIAL/PLATELET
Basophils Absolute: 0.1 10*3/uL (ref 0.0–0.2)
Basos: 1 %
EOS (ABSOLUTE): 0.3 10*3/uL (ref 0.0–0.4)
Eos: 3 %
Hematocrit: 37.9 % (ref 34.0–46.6)
Hemoglobin: 12.7 g/dL (ref 11.1–15.9)
Immature Grans (Abs): 0.1 10*3/uL (ref 0.0–0.1)
Immature Granulocytes: 1 %
Lymphocytes Absolute: 3.7 10*3/uL — ABNORMAL HIGH (ref 0.7–3.1)
Lymphs: 35 %
MCH: 29.1 pg (ref 26.6–33.0)
MCHC: 33.5 g/dL (ref 31.5–35.7)
MCV: 87 fL (ref 79–97)
Monocytes Absolute: 0.9 10*3/uL (ref 0.1–0.9)
Monocytes: 9 %
Neutrophils Absolute: 5.5 10*3/uL (ref 1.4–7.0)
Neutrophils: 51 %
Platelets: 311 10*3/uL (ref 150–450)
RBC: 4.36 x10E6/uL (ref 3.77–5.28)
RDW: 13 % (ref 11.7–15.4)
WBC: 10.5 10*3/uL (ref 3.4–10.8)

## 2019-08-12 LAB — VITAMIN D 25 HYDROXY (VIT D DEFICIENCY, FRACTURES): Vit D, 25-Hydroxy: 14.8 ng/mL — ABNORMAL LOW (ref 30.0–100.0)

## 2019-08-12 MED ORDER — VITAMIN D (ERGOCALCIFEROL) 1.25 MG (50000 UNIT) PO CAPS: 50000.0000 [IU] | ORAL_CAPSULE | ORAL | 3 refills | Status: DC

## 2019-08-12 MED FILL — VIT D2 1.25 MG (50,000 UNIT: 1.25 MG | 90 days supply | Qty: 13 | Fill #0

## 2019-08-13 ENCOUNTER — Ambulatory Visit: Payer: 59 | Admitting: Orthopaedic Surgery

## 2019-08-13 ENCOUNTER — Encounter: Payer: Self-pay | Admitting: Orthopaedic Surgery

## 2019-08-13 ENCOUNTER — Other Ambulatory Visit: Payer: Self-pay

## 2019-08-13 DIAGNOSIS — M25561 Pain in right knee: Secondary | ICD-10-CM | POA: Diagnosis not present

## 2019-08-13 DIAGNOSIS — M25462 Effusion, left knee: Secondary | ICD-10-CM

## 2019-08-13 DIAGNOSIS — G8929 Other chronic pain: Secondary | ICD-10-CM | POA: Diagnosis not present

## 2019-08-13 MED ORDER — DICLOFENAC SODIUM 75 MG PO TBEC: 75.0000 mg | DELAYED_RELEASE_TABLET | Freq: Two times a day (BID) | ORAL | 2 refills | Status: DC

## 2019-08-13 MED FILL — DICLOFENAC SOD EC 75 MG TAB: 75 | 15 days supply | Qty: 30 | Fill #0

## 2019-08-13 NOTE — Progress Notes (Signed)
Office Visit Note   Patient: [name]           Date of Birth: [birth year]           MRN: [id number] Visit Date: 08/13/2019              Requested by: No referring provider defined for this encounter. PCP: Patient, No Pcp Per   Assessment & Plan: Visit Diagnoses:  1. Effusion, left knee   2. Chronic pain of right knee     Plan: [name] has gotten some relief from the cortisone injection but I feel that she is still feeling the effects of the edema in the proximal tibia.  Her pain is certainly better and does not warrant any surgical intervention at this point.  I do think that she will need to continue with the same work restrictions for another 4 weeks.  Based on discussion she would like to try p.o. diclofenac.  We will recheck her in 6weeks.  Follow-Up Instructions: Return in about 6 weeks (around 09/24/2019).   Orders:  No orders of the defined types were placed in this encounter.  Meds ordered this encounter  Medications  . diclofenac (VOLTAREN) 75 MG EC tablet    Sig: Take 1 tablet (75 mg total) by mouth 2 (two) times daily.    Dispense:  30 tablet    Refill:  2      Procedures: No procedures performed   Clinical Data: No additional findings.   Subjective: Chief Complaint  Patient presents with  . Right Knee - Pain    [name] returns today for review of right knee MRI.  The injection did help some.  She continues to take over-the-counter pain medications.  She is currently working 4 hours a day.  She still has medial side knee pain.  The cortisone injection helped for the the generalized aching knee pain.   Review of Systems   Objective: Vital Signs: There were no vitals taken for this visit.  Physical Exam  Ortho Exam Right knee exam is unchanged. Specialty Comments:  No specialty comments available.  Imaging: No results found.   PMFS History: Patient Active Problem List   Diagnosis Date Noted  . Acute medial meniscus tear of right knee  06/30/2019  . Primary osteoarthritis of right knee 06/30/2019  . Unilateral primary osteoarthritis, left knee 02/12/2018  . Pain in left hip 01/28/2018  . 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 Acute medial meniscus tear 11/05/2017  . Synovitis of left knee 11/05/2017  . Insufficiency fracture of tibia 11/05/2017  . Effusion, left knee 10/23/2017  . Left knee pain 10/20/2017  . Right leg weakness 12/19/2016  . Acute upper respiratory infection 03/13/2016  . Cervical radiculitis 11/29/2015  . Numbness 11/29/2015  . Squamous cell carcinoma 05/25/2015  . Neck pain on right side 05/03/2015  . 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 Ataxic gait 11/10/2014  . High risk medication use 11/10/2014  . Urinary frequency 11/10/2014  . Right carpal tunnel syndrome 11/10/2014  . Influenza-like symptoms 02/03/2014  . Allergic rhinitis 09/14/2013  . ADD (attention deficit disorder) 03/10/2012  . Tobacco use 09/10/2011  . Visit for preventive health examination 09/10/2011  . MS (multiple sclerosis) ([place]) 09/04/2011   Past Medical History:  Diagnosis Date  . ADD (attention deficit disorder with hyperactivity)   . Anxiety   . Arthritis   . Cancer ([place])    Skin cancer- basal cell, squamous cell  . Cervical dysplasia   .  GERD (gastroesophageal reflux disease)   . Headache    migraine  . Hepatitis    Pt states Core and surface antibioties for Hepatitis whens he was 70  . Mitral valve prolapse   . Multiple sclerosis ([place])   . Multiple sclerosis ([place])   . Tendonitis in rigtht wrist     Family History  Problem Relation Age of Onset  . Depression Mother   . Stroke Mother   . Heart disease Mother   . Dementia Mother   . Cancer Neg Hx   . Diabetes Neg Hx     Past Surgical History:  Procedure Laterality Date  . ABDOMINAL HYSTERECTOMY  2004   ovaries retained  . BACK SURGERY    . heart ablation     15 yrs ago for a fib by Dr.Klein, everything ok  . KNEE ARTHROSCOPY WITH SUBCHONDROPLASTY Left  11/13/2017   Procedure: LEFT KNEE ARTHROSCOPY WITH SUBCHONDROPLASTY, PARTIAL MEDIAL MENISCECTOMY, SYNOVECTOMY;  Surgeon: Leandrew Koyanagi, MD;  Location: [facility];  Service: Orthopedics;  Laterality: Left;  . TUBAL LIGATION     Social History   Occupational History  . Not on file  Tobacco Use  . Smoking status: Current Some Day Smoker    Packs/day: 0.25    Types: Cigarettes  . Smokeless tobacco: Never Used  Substance and Sexual Activity  . Alcohol use: Yes    Alcohol/week: 0.0 standard drinks    Comment: Occasioanl glass of wine   . Drug use: No  . Sexual activity: Yes    Birth control/protection: Surgical

## 2019-08-21 MED FILL — PANTOPRAZOLE SOD DR 40 MG T: 40 | 90 days supply | Qty: 90 | Fill #1

## 2019-08-31 ENCOUNTER — Telehealth: Payer: Self-pay | Admitting: Orthopaedic Surgery

## 2019-08-31 NOTE — Telephone Encounter (Signed)
Patient can come pick up tomorrow 4/13

## 2019-08-31 NOTE — Telephone Encounter (Signed)
Note made.  

## 2019-08-31 NOTE — Telephone Encounter (Signed)
Pt called in wanting to know if she could get a work note stating she could work for 6 hours until approved for a full 8 hour day? If so the pt would like a call when this is ready for pickup.    [phone]

## 2019-08-31 NOTE — Telephone Encounter (Signed)
yes

## 2019-09-07 ENCOUNTER — Ambulatory Visit (INDEPENDENT_AMBULATORY_CARE_PROVIDER_SITE_OTHER): Payer: 59 | Admitting: Psychiatry

## 2019-09-07 ENCOUNTER — Encounter (HOSPITAL_COMMUNITY): Payer: Self-pay | Admitting: Psychiatry

## 2019-09-07 ENCOUNTER — Other Ambulatory Visit: Payer: Self-pay

## 2019-09-07 DIAGNOSIS — F330 Major depressive disorder, recurrent, mild: Secondary | ICD-10-CM

## 2019-09-07 DIAGNOSIS — F900 Attention-deficit hyperactivity disorder, predominantly inattentive type: Secondary | ICD-10-CM

## 2019-09-07 MED ORDER — LISDEXAMFETAMINE DIMESYLATE 60 MG PO CAPS: 60.0000 mg | ORAL_CAPSULE | ORAL | 0 refills | Status: DC

## 2019-09-07 MED FILL — VYVANSE 60 MG CAPSULE: 60 | 90 days supply | Qty: 90 | Fill #0

## 2019-09-07 NOTE — Progress Notes (Signed)
Virtual Visit via Telephone Note  I connected with [name] on 09/07/19 at  3:40 PM EDT by telephone and verified that I am speaking with the correct person using two identifiers.   I discussed the limitations, risks, security and privacy concerns of performing an evaluation and management service by telephone and the availability of in person appointments. I also discussed with the patient that there may be a patient responsible charge related to this service. The patient expressed understanding and agreed to proceed.   History of Present Illness: Patient was evaluated by phone session.  She is taking her medication as prescribed.  She is somewhat frustrated with urology as despite frequent visits to urology however incontinent bladder has not resolved.  She feels sometimes embarrassing.  She also wake up in the middle of the night to go to the bathroom.  She admitted some anxiety but denies any crying spells or any feeling of hopelessness or worthlessness.  She is thinking to explore other options and looking for urologist who has expertise treating MS patients.  She reported her energy level is fair.  There has been no change in her appetite and weight.  She is not interested in therapy.  She is getting Prozac from her neurology.  She has no tremors or shakes.  Her attention, concentration, multitasking is good.  She endorsed work is busy but she is handling her job without any problem.    Past Psychiatric History:Reviewed. H/O depression and ADD. Tried Zoloft,Focalin, Ritalin and Strattera. No history of inpatient or any suicidal attempt.   Psychiatric Specialty Exam: Physical Exam  Review of Systems  There were no vitals taken for this visit.There is no height or weight on file to calculate BMI.  General Appearance: NA  Eye Contact:  NA  Speech:  Clear and Coherent and Normal Rate  Volume:  Normal  Mood:  Anxious  Affect:  NA  Thought Process:  Goal Directed  Orientation:  Full  (Time, Place, and Person)  Thought Content:  WDL  Suicidal Thoughts:  No  Homicidal Thoughts:  No  Memory:  Immediate;   Good Recent;   Good Remote;   Good  Judgement:  Good  Insight:  Good  Psychomotor Activity:  NA  Concentration:  Concentration: Good and Attention Span: Good  Recall:  Good  Fund of Knowledge:  Good  Language:  Good  Akathisia:  No  Handed:  Right  AIMS (if indicated):     Assets:  Communication Skills Desire for Improvement Housing Resilience Social Support Talents/Skills Transportation  ADL's:  Intact  Cognition:  WNL  Sleep:   ok      Assessment and Plan: Attention deficit disorder, inattentive type.  Major depressive disorder, recurrent.  Patient is a stable on her current medication.  Her attention, focus and depression is stable.  I will continue Vyvanse 60 mg daily.  She is getting Prozac 60 mg from neurology.  Recommended to call us back if she is any question of any concern.  Follow-up in 3 months.  Follow Up Instructions:    I discussed the assessment and treatment plan with the patient. The patient was provided an opportunity to ask questions and all were answered. The patient agreed with the plan and demonstrated an understanding of the instructions.   The patient was advised to call back or seek an in-person evaluation if the symptoms worsen or if the condition fails to improve as anticipated.  I provided 20 minutes of non-face-to-face time  during this encounter.   Kathlee Nations, MD

## 2019-09-24 ENCOUNTER — Encounter: Payer: Self-pay | Admitting: Orthopaedic Surgery

## 2019-09-24 ENCOUNTER — Other Ambulatory Visit: Payer: Self-pay

## 2019-09-24 ENCOUNTER — Ambulatory Visit: Payer: 59 | Admitting: Orthopaedic Surgery

## 2019-09-24 DIAGNOSIS — M1711 Unilateral primary osteoarthritis, right knee: Secondary | ICD-10-CM

## 2019-09-24 NOTE — Progress Notes (Signed)
Office Visit Note   Patient: [name]           Date of Birth: Apr 23, 1961           MRN: [id number] Visit Date: 09/24/2019              Requested by: No referring provider defined for this encounter. PCP: Patient, No Pcp Per   Assessment & Plan: Visit Diagnoses:  1. Primary osteoarthritis of right knee     Plan: Darcey is doing great from the p.o. diclofenac.  She will continue to take this as needed.  She knows to watch out for any signs or symptoms of side effects from the NSAID such as hypertension, renal effects, GI symptoms.  She does see her neurologist every 6 months and gets lab work.  From my standpoint we can see her back as needed.  Work note provided today for return on the 10th for full 8-hour days.  Follow-Up Instructions: Return if symptoms worsen or fail to improve.   Orders:  No orders of the defined types were placed in this encounter.  No orders of the defined types were placed in this encounter.     Procedures: No procedures performed   Clinical Data: No additional findings.   Subjective: Chief Complaint  Patient presents with  . Right Knee - Follow-up    [name] returns today for follow-up of her chronic right knee pain due to DJD.  She is doing much better.  The NSAIDs are helping tremendously.  She is not having to take them on a regular basis.   Review of Systems   Objective: Vital Signs: There were no vitals taken for this visit.  Physical Exam  Ortho Exam Right knee exam is unremarkable. Specialty Comments:  No specialty comments available.  Imaging: No results found.   PMFS History: Patient Active Problem List   Diagnosis Date Noted  . Acute medial meniscus tear of right knee 06/30/2019  . Primary osteoarthritis of right knee 06/30/2019  . Unilateral primary osteoarthritis, left knee 02/12/2018  . Pain in left hip 01/28/2018  . 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 Acute medial meniscus tear 11/05/2017  .  Synovitis of left knee 11/05/2017  . Insufficiency fracture of tibia 11/05/2017  . Effusion, left knee 10/23/2017  . Left knee pain 10/20/2017  . Right leg weakness 12/19/2016  . Acute upper respiratory infection 03/13/2016  . Cervical radiculitis 11/29/2015  . Numbness 11/29/2015  . Squamous cell carcinoma 05/25/2015  . Neck pain on right side 05/03/2015  . 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 Ataxic gait 11/10/2014  . High risk medication use 11/10/2014  . Urinary frequency 11/10/2014  . Right carpal tunnel syndrome 11/10/2014  . Influenza-like symptoms 02/03/2014  . Allergic rhinitis 09/14/2013  . ADD (attention deficit disorder) 03/10/2012  . Tobacco use 09/10/2011  . Visit for preventive health examination 09/10/2011  . MS (multiple sclerosis) ([place]) 09/04/2011   Past Medical History:  Diagnosis Date  . ADD (attention deficit disorder with hyperactivity)   . Anxiety   . Arthritis   . Cancer ([place])    Skin cancer- basal cell, squamous cell  . Cervical dysplasia   . GERD (gastroesophageal reflux disease)   . Headache    migraine  . Hepatitis    Pt states Core and surface antibioties for Hepatitis whens he was 48  . Mitral valve prolapse   . Multiple sclerosis ([place])   . Multiple sclerosis ([place])   . Tendonitis in  rigtht wrist     Family History  Problem Relation Age of Onset  . Depression Mother   . Stroke Mother   . Heart disease Mother   . Dementia Mother   . Cancer Neg Hx   . Diabetes Neg Hx     Past Surgical History:  Procedure Laterality Date  . ABDOMINAL HYSTERECTOMY  2004   ovaries retained  . BACK SURGERY    . heart ablation     15 yrs ago for a fib by Dr.Klein, everything ok  . KNEE ARTHROSCOPY WITH SUBCHONDROPLASTY Left 11/13/2017   Procedure: LEFT KNEE ARTHROSCOPY WITH SUBCHONDROPLASTY, PARTIAL MEDIAL MENISCECTOMY, SYNOVECTOMY;  Surgeon: Leandrew Koyanagi, MD;  Location: [facility];  Service: Orthopedics;  Laterality: Left;  . TUBAL LIGATION      Social History   Occupational History  . Not on file  Tobacco Use  . Smoking status: Current Some Day Smoker    Packs/day: 0.25    Types: Cigarettes  . Smokeless tobacco: Never Used  Substance and Sexual Activity  . Alcohol use: Yes    Alcohol/week: 0.0 standard drinks    Comment: Occasioanl glass of wine   . Drug use: No  . Sexual activity: Yes    Birth control/protection: Surgical

## 2019-10-02 DIAGNOSIS — N3942 Incontinence without sensory awareness: Secondary | ICD-10-CM | POA: Diagnosis not present

## 2019-10-02 DIAGNOSIS — R350 Frequency of micturition: Secondary | ICD-10-CM | POA: Diagnosis not present

## 2019-10-13 MED FILL — FLUoxetine HCL 20 MG CAPS: 20 | 90 days supply | Qty: 270 | Fill #2

## 2019-10-15 DIAGNOSIS — R159 Full incontinence of feces: Secondary | ICD-10-CM | POA: Diagnosis not present

## 2019-10-15 DIAGNOSIS — K529 Noninfective gastroenteritis and colitis, unspecified: Secondary | ICD-10-CM | POA: Diagnosis not present

## 2019-10-15 MED FILL — OSCIMIN SR 0.375 MG TABLET: 0.375 | 30 days supply | Qty: 60 | Fill #0

## 2019-10-22 ENCOUNTER — Encounter: Payer: Self-pay | Admitting: Orthopaedic Surgery

## 2019-11-04 DIAGNOSIS — C44722 Squamous cell carcinoma of skin of right lower limb, including hip: Secondary | ICD-10-CM | POA: Diagnosis not present

## 2019-11-19 DIAGNOSIS — S81801A Unspecified open wound, right lower leg, initial encounter: Secondary | ICD-10-CM | POA: Diagnosis not present

## 2019-11-26 DIAGNOSIS — S81801A Unspecified open wound, right lower leg, initial encounter: Secondary | ICD-10-CM | POA: Diagnosis not present

## 2019-11-26 DIAGNOSIS — Z4801 Encounter for change or removal of surgical wound dressing: Secondary | ICD-10-CM | POA: Diagnosis not present

## 2019-12-03 DIAGNOSIS — Z4801 Encounter for change or removal of surgical wound dressing: Secondary | ICD-10-CM | POA: Diagnosis not present

## 2019-12-03 DIAGNOSIS — S81801D Unspecified open wound, right lower leg, subsequent encounter: Secondary | ICD-10-CM | POA: Diagnosis not present

## 2019-12-09 ENCOUNTER — Other Ambulatory Visit (HOSPITAL_COMMUNITY): Payer: Self-pay | Admitting: Gastroenterology

## 2019-12-09 ENCOUNTER — Telehealth (INDEPENDENT_AMBULATORY_CARE_PROVIDER_SITE_OTHER): Payer: 59 | Admitting: Psychiatry

## 2019-12-09 ENCOUNTER — Other Ambulatory Visit: Payer: Self-pay

## 2019-12-09 ENCOUNTER — Encounter (HOSPITAL_COMMUNITY): Payer: Self-pay | Admitting: Psychiatry

## 2019-12-09 DIAGNOSIS — F330 Major depressive disorder, recurrent, mild: Secondary | ICD-10-CM

## 2019-12-09 DIAGNOSIS — F900 Attention-deficit hyperactivity disorder, predominantly inattentive type: Secondary | ICD-10-CM | POA: Diagnosis not present

## 2019-12-09 MED ORDER — LISDEXAMFETAMINE DIMESYLATE 60 MG PO CAPS: 60.0000 mg | ORAL_CAPSULE | ORAL | 0 refills | Status: DC

## 2019-12-09 MED FILL — VYVANSE 60 MG CAPSULE: 60 | 90 days supply | Qty: 90 | Fill #0

## 2019-12-09 MED FILL — PANTOPRAZOLE SOD DR 40 MG T: 40 | 90 days supply | Qty: 90 | Fill #0

## 2019-12-09 NOTE — Progress Notes (Signed)
Virtual Visit via Telephone Note  I connected with Vianney Kopecky Debose on 12/09/19 at  4:00 PM EDT by telephone and verified that I am speaking with the correct person using two identifiers.  Location: Patient: home Provider: home office   I discussed the limitations, risks, security and privacy concerns of performing an evaluation and management service by telephone and the availability of in person appointments. I also discussed with the patient that there may be a patient responsible charge related to this service. The patient expressed understanding and agreed to proceed.   History of Present Illness: Patient is evaluated by phone session.  She is taking her medication as prescribed.  She had stopped taking Vesicare because it did not help her bladder incontinence.  She understand that she has to live with her and she is handling situation better than she anticipated.  She denies any crying spells or any feeling of hopelessness.  Her job is busy but no major issues.  She is able to do multitasking.  Her attention concentration is good.  Her energy level is okay.  She is sleeping good and denies any tremors shakes or any EPS.  Recently she was seen by Ortho for knee pain and given diclofenac which she takes as needed.  Patient does not want to change her medication.  Her appetite is okay.  Past Psychiatric History:Reviewed. H/Odepression and ADD. Tried Zoloft,Focalin, Ritalin and Strattera. No history of inpatient or any suicidal attempt.    Psychiatric Specialty Exam: Physical Exam  Review of Systems  There were no vitals taken for this visit.There is no height or weight on file to calculate BMI.  General Appearance: NA  Eye Contact:  NA  Speech:  Clear and Coherent  Volume:  Normal  Mood:  Euthymic  Affect:  NA  Thought Process:  Goal Directed  Orientation:  Full (Time, Place, and Person)  Thought Content:  WDL and Logical  Suicidal Thoughts:  No  Homicidal Thoughts:  No   Memory:  Immediate;   Good Recent;   Good Remote;   Good  Judgement:  Good  Insight:  Present  Psychomotor Activity:  NA  Concentration:  Concentration: Good and Attention Span: Good  Recall:  Good  Fund of Knowledge:  Good  Language:  Good  Akathisia:  No  Handed:  Right  AIMS (if indicated):     Assets:  Communication Skills Desire for Improvement Housing Resilience Social Support Talents/Skills Transportation  ADL's:  Intact  Cognition:  WNL  Sleep:         Assessment and Plan: Attention deficit disorder, inattentive type.  Major depressive disorder, recurrent.  Patient is a stable on her current medication.  Continue Vyvanse 60 mg daily and she is getting Prozac 60 mg for neurology.  Discussed medication side effects and benefits.  Recommended to call us back if she has any question or any concern.  Follow-up in 3 months.  Follow Up Instructions:    I discussed the assessment and treatment plan with the patient. The patient was provided an opportunity to ask questions and all were answered. The patient agreed with the plan and demonstrated an understanding of the instructions.   The patient was advised to call back or seek an in-person evaluation if the symptoms worsen or if the condition fails to improve as anticipated.  I provided 15 minutes of non-face-to-face time during this encounter.   Kathlee Nations, MD

## 2019-12-10 DIAGNOSIS — S81801A Unspecified open wound, right lower leg, initial encounter: Secondary | ICD-10-CM | POA: Diagnosis not present

## 2019-12-10 DIAGNOSIS — Z4801 Encounter for change or removal of surgical wound dressing: Secondary | ICD-10-CM | POA: Diagnosis not present

## 2019-12-16 MED FILL — DICLOFENAC SOD EC 75 MG TAB: 75 | 15 days supply | Qty: 30 | Fill #1

## 2019-12-28 MED FILL — DICLOFENAC SOD EC 75 MG TAB: 75 | 15 days supply | Qty: 30 | Fill #1

## 2020-01-20 MED FILL — FLUoxetine HCL 20 MG CAPS: 20 | 90 days supply | Qty: 270 | Fill #3

## 2020-01-22 ENCOUNTER — Ambulatory Visit: Payer: 59

## 2020-01-27 DIAGNOSIS — T880XXA Infection following immunization, initial encounter: Secondary | ICD-10-CM | POA: Diagnosis not present

## 2020-01-27 DIAGNOSIS — R197 Diarrhea, unspecified: Secondary | ICD-10-CM | POA: Diagnosis not present

## 2020-01-27 DIAGNOSIS — R112 Nausea with vomiting, unspecified: Secondary | ICD-10-CM | POA: Diagnosis not present

## 2020-01-27 MED FILL — ONDANSETRON ODT 8 MG TABLET: 8 | 10 days supply | Qty: 30 | Fill #0

## 2020-02-03 ENCOUNTER — Encounter: Payer: Self-pay | Admitting: Neurology

## 2020-02-03 ENCOUNTER — Ambulatory Visit: Payer: 59 | Admitting: Neurology

## 2020-02-03 VITALS — BP 104/60 | HR 110 | Ht 67.5 in | Wt 164.3 lb

## 2020-02-03 DIAGNOSIS — H539 Unspecified visual disturbance: Secondary | ICD-10-CM | POA: Diagnosis not present

## 2020-02-03 DIAGNOSIS — R159 Full incontinence of feces: Secondary | ICD-10-CM | POA: Diagnosis not present

## 2020-02-03 DIAGNOSIS — G35 Multiple sclerosis: Secondary | ICD-10-CM

## 2020-02-03 DIAGNOSIS — M791 Myalgia, unspecified site: Secondary | ICD-10-CM | POA: Diagnosis not present

## 2020-02-03 DIAGNOSIS — R7989 Other specified abnormal findings of blood chemistry: Secondary | ICD-10-CM | POA: Diagnosis not present

## 2020-02-03 DIAGNOSIS — R509 Fever, unspecified: Secondary | ICD-10-CM | POA: Diagnosis not present

## 2020-02-03 DIAGNOSIS — R350 Frequency of micturition: Secondary | ICD-10-CM | POA: Diagnosis not present

## 2020-02-03 MED ORDER — HYOSCYAMINE SULFATE 0.125 MG PO TABS: 0.1250 mg | ORAL_TABLET | ORAL | 5 refills | Status: DC | PRN

## 2020-02-03 MED ORDER — DEXAMETHASONE 4 MG PO TABS: ORAL_TABLET | ORAL | 0 refills | Status: DC

## 2020-02-03 MED FILL — DEXAMETHASONE 4 MG TABLET: 4 | 6 days supply | Qty: 21 | Fill #0

## 2020-02-03 MED FILL — OSCIMIN 0.125 MG TABLET: 0.125 | 20 days supply | Qty: 120 | Fill #0

## 2020-02-03 NOTE — Progress Notes (Addendum)
[facility]  PATIENT: [name] DOB: [birth year]     HISTORICAL  CHIEF COMPLAINT:  Chief Complaint  Patient presents with  . Follow-up    Rm 13 alone  . Multiple Sclerosis    On Vumerity   . Covid Vacc Booster    Pt reports she recieved her 3rd pfizer vaccine on 01/21/2020 and reports from 9/3-9/14 she was bedbound due to N/V dizziness and muscle fatigue. Pt reports she feels short of breath at times and her heart rate stays in the upper 110-150. Pt reports she does not feel like her self.     HISTORY OF PRESENT ILLNESS:  [name] is a 59 y.o.woman with relapsing remitting multiple sclerosis.   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  She had dizziness, N/V (x5 days) and myalgias.  She felt nonfunctional for a few days.   The N/V have now resolved.    The myalgias persist, especially in her thighs.  They are tender to squeeze.   The dizziness is better but not resolved.     Vision seems worse and the right eye twitches  She is now on Vumerity and has no Gi issues now.   She was allergic to Ocrevus and had multiple skin cancers while on Gilenya (still has had some on other medications).   She had another Mohs a couple months ago and has no more recent skin lesions.    She is still having bladder and bowel incontinence.   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  .    She has extreme urgency with both and can't always get to a facility in time.   Her problem is more at work where there is not a bathroom near by and is with patients.  She sleeps poorly at night due to nocturia.   MS history: She was diagnosed with relapsing remitting MS around 2000 after presenting with numbness in the hands and feet.  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 She was admitted to the  hospital. She then was diagnosed with MS and is to see Dr. Jacqulynn Cadet at [facility]. He started her on Betaseron.   She did fairly well on Betaseron. She did have several small exacerbations and received some steroids a couple times. Often the exacerbations would be associated with headache as well. About 4 years ago, she opted to switch to Gilenya to have oral therapy for the MS. She has done very well on that medication without any exacerbations.  Her June 2015 MRI did show one focus that was not present on her MRI from March 2010.  Due to several skin cancers, she switched to Copaxone in 2018 but had breakthrough activity.  She switched to [place] in 2019 but had difficulty tolerating the medication.  She switched to Vumerity in early 2020.  Recent imaging: MRI brain 03/02/2019 showed a "new lesion involving the right superior temporal gyrus consistent with progression of disease.    Increased prominence of lesions in the subcortical white matter of the cingulate gyrus bilaterally, right greater than left. This could impact symptoms in the feet or lower extremities and is also consistent with progression of disease.  Progressive signal change in the left sylvian fissure. No enhancement or restricted diffusion to suggest active demyelination."  MRI of the cervical  spine 02/24/2004 showed a normal spinal cord and a disc protrusion to the right at C5-C6 causing right C6 nerve root encroachment.  MRI of the thoracic spine 08/16/2005 showed disc protrusion at T9-T10 and milder protrusions elsewhere but no evidence of demyelination  ALLERGIES: Allergies  Allergen Reactions  . Methylprednisolone Hives  . Solu-Medrol [Methylprednisolone Sodium Succ] Hives and Itching  . Cephalexin Hives and Itching    HOME MEDICATIONS:  Current Outpatient Medications:  .  diclofenac (VOLTAREN) 75 MG EC tablet, Take 1 tablet (75 mg total) by mouth 2 (two) times daily., Disp: 30 tablet, Rfl: 2 .  Diroximel Fumarate  (VUMERITY PO), Take by mouth., Disp: , Rfl:  .  FLUoxetine (PROZAC) 20 MG capsule, TAKE 3 CAPSULES (60 MG TOTAL) BY MOUTH DAILY, Disp: 270 capsule, Rfl: 4 .  lisdexamfetamine (VYVANSE) 60 MG capsule, Take 1 capsule (60 mg total) by mouth every morning., Disp: 90 capsule, Rfl: 0 .  pantoprazole (PROTONIX) 40 MG tablet, Take 40 mg by mouth daily., Disp: , Rfl:  .  VUMERITY 231 MG CPDR, Take 462 mg by mouth 2 (two) times daily., Disp: 120 capsule, Rfl: 11 .  dexamethasone (DECADRON) 4 MG tablet, 6 pills po x 1 day, then 5 pills po x 1 day, then 4 pills po x 1 day, then 3 pills po x 1 day, then 2 pills po x 1 day, then 1 pill po x 1 day, Disp: 21 tablet, Rfl: 0 .  hyoscyamine (LEVSIN) 0.125 MG tablet, Take 1 tablet (0.125 mg total) by mouth every 4 (four) hours as needed., Disp: 120 tablet, Rfl: 5  PAST MEDICAL HISTORY: Past Medical History:  Diagnosis Date  . ADD (attention deficit disorder with hyperactivity)   . Anxiety   . Arthritis   . Cancer ([place])    Skin cancer- basal cell, squamous cell  . Cervical dysplasia   . GERD (gastroesophageal reflux disease)   . Headache    migraine  . Hepatitis    Pt states Core and surface antibioties for Hepatitis whens he was 36  . Mitral valve prolapse   . Multiple sclerosis ([place])   . Multiple sclerosis ([place])   . Tendonitis in rigtht wrist     PAST SURGICAL HISTORY: Past Surgical History:  Procedure Laterality Date  . ABDOMINAL HYSTERECTOMY  2004   ovaries retained  . BACK SURGERY    . heart ablation     15 yrs ago for a fib by Dr.Klein, everything ok  . KNEE ARTHROSCOPY WITH SUBCHONDROPLASTY Left 11/13/2017   Procedure: LEFT KNEE ARTHROSCOPY WITH SUBCHONDROPLASTY, PARTIAL MEDIAL MENISCECTOMY, SYNOVECTOMY;  Surgeon: Leandrew Koyanagi, MD;  Location: [place];  Service: Orthopedics;  Laterality: Left;  . TUBAL LIGATION      FAMILY HISTORY: Family History  Problem Relation Age of Onset  . Depression Mother   . Stroke Mother    . Heart disease Mother   . Dementia Mother   . Cancer Neg Hx   . Diabetes Neg Hx     SOCIAL HISTORY:  Social History   Socioeconomic History  . Marital status: Divorced    Spouse name: Not on file  . Number of children: Not on file  . Years of education: Not on file  . Highest education level: Not on file  Occupational History  . Not on file  Tobacco Use  . Smoking status: Current Some Day Smoker    Packs/day: 0.25    Types: Cigarettes  . Smokeless tobacco: Never Used  Vaping Use  . Vaping Use: Never used  Substance and Sexual Activity  . Alcohol use: Yes    Alcohol/week: 0.0 standard drinks    Comment: Occasioanl glass of wine   . Drug use: No  . Sexual activity: Yes    Birth control/protection: Surgical  Other Topics Concern  . Not on file  Social History Narrative  . Not on file   Social Determinants of Health   Financial Resource Strain:   . Difficulty of Paying Living Expenses: Not on file  Food Insecurity:   . Worried About Charity fundraiser in the Last Year: Not on file  . Ran Out of Food in the Last Year: Not on file  Transportation Needs:   . Lack of Transportation (Medical): Not on file  . Lack of Transportation (Non-Medical): Not on file  Physical Activity:   . Days of Exercise per Week: Not on file  . Minutes of Exercise per Session: Not on file  Stress:   . Feeling of Stress : Not on file  Social Connections:   . Frequency of Communication with Friends and Family: Not on file  . Frequency of Social Gatherings with Friends and Family: Not on file  . Attends Religious Services: Not on file  . Active Member of Clubs or Organizations: Not on file  . Attends Archivist Meetings: Not on file  . Marital Status: Not on file  Intimate Partner Violence:   . Fear of Current or Ex-Partner: Not on file  . Emotionally Abused: Not on file  . Physically Abused: Not on file  . Sexually Abused: Not on file     PHYSICAL EXAM   BP 104/60    Pulse (!) 110   Ht 5' 7.5" (1.715 m)   Wt 164 lb 5 oz (74.5 kg)   SpO2 97%   BMI 25.36 kg/m    General: The patient is well-developed and well-nourished and in no acute distress  INeurologic Exam  Mental status: The patient is alert and oriented x 3 at the time of the examination. The patient has apparent normal recent and remote memory, with an apparently normal attention span and concentration ability.   Speech is normal.  Cranial nerves: Extraocular movements are full.  Color vision was symmetric.  Facial strength is normal.  Trapezius strength is normal.  Hearing is normal.  Motor:  Muscle bulk is normal.  Muscle tone is increased in the legs.  She has reduced strength in the right leg relative to the left  Sensory: Sensory testing is intact to touch and vibration .    Coordination: Cerebellar testing reveals good finger-nose-finger .  Heel-to-shin is reduced in the right leg.  Gait and station: Station is normal.   She has a very slight right foot drop.  Tandem gait is mildly wide.  Romberg is negative.  Reflexes: Deep tendon reflexes are symmetric and normal bilaterally.     DIAGNOSTIC DATA (LABS, IMAGING, TESTING) - I reviewed patient records, labs, notes, testing and imaging myself where available.  Lab Results  Component Value Date   WBC 10.5 08/11/2019   HGB 12.7 08/11/2019   HCT 37.9 08/11/2019   MCV 87 08/11/2019   PLT 311 08/11/2019      Component Value Date/Time   NA 139 08/11/2019 1614   K 4.2 08/11/2019 1614   CL 100 08/11/2019 1614   CO2 27 08/11/2019 1614   GLUCOSE 89 08/11/2019 1614   BUN 15 08/11/2019 1614  CREATININE 0.58 08/11/2019 1614   CALCIUM 9.5 08/11/2019 1614   PROT 6.4 08/11/2019 1614   ALBUMIN 4.3 08/11/2019 1614   AST 14 08/11/2019 1614   ALT 15 08/11/2019 1614   ALKPHOS 109 08/11/2019 1614   BILITOT <0.2 08/11/2019 1614   GFRNONAA 102 08/11/2019 1614   GFRAA 117 08/11/2019 1614       ASSESSMENT AND PLAN  MS (multiple  sclerosis) (HCC)  Myalgia - Plan: CK, CBC with Differential/Platelet, Comprehensive metabolic panel, TSH  Low grade fever - Plan: CK, CBC with Differential/Platelet, Comprehensive metabolic panel, TSH  Vision disturbance  Urinary frequency  Incontinence of feces, unspecified fecal incontinence type  Multiple sclerosis ([place]) - Plan: MR BRAIN W WO CONTRAST  1.   Continue Vumerity.  Check blood work.  We will check an MRI of the brain to determine if there is any subclinical progression.  Consider a stronger medication if this is occurring. 2.   Hyoscyamine for incontinence. 3.   Short course of Decadron for myalgias.  4.    She will return to see [place] in 6 months or sooner if any new or worsening neurologic symptoms.   Mersadie Kavanaugh A. Felecia Shelling, MD, PhD, Charlynn Grimes 8/78/6767, 2:09 PM Certified in Neurology, Clinical Neurophysiology, Sleep Medicine, Pain Medicine and Neuroimaging  [facility] [address] [place], [place] [address] [phone]

## 2020-02-03 NOTE — Addendum Note (Signed)
Addended by: Arlice Colt A on: 02/03/2020 03:00 PM   Modules accepted: Orders

## 2020-02-04 ENCOUNTER — Other Ambulatory Visit: Payer: Self-pay | Admitting: Neurology

## 2020-02-04 ENCOUNTER — Encounter: Payer: Self-pay | Admitting: Neurology

## 2020-02-04 ENCOUNTER — Telehealth: Payer: Self-pay | Admitting: Neurology

## 2020-02-04 DIAGNOSIS — R7989 Other specified abnormal findings of blood chemistry: Secondary | ICD-10-CM

## 2020-02-04 DIAGNOSIS — M791 Myalgia, unspecified site: Secondary | ICD-10-CM

## 2020-02-04 LAB — CBC WITH DIFFERENTIAL/PLATELET
Basophils Absolute: 0.1 10*3/uL (ref 0.0–0.2)
Basos: 1 %
EOS (ABSOLUTE): 0.2 10*3/uL (ref 0.0–0.4)
Eos: 3 %
Hematocrit: 34.3 % (ref 34.0–46.6)
Hemoglobin: 10.8 g/dL — ABNORMAL LOW (ref 11.1–15.9)
Immature Grans (Abs): 0 10*3/uL (ref 0.0–0.1)
Immature Granulocytes: 0 %
Lymphocytes Absolute: 3.7 10*3/uL — ABNORMAL HIGH (ref 0.7–3.1)
Lymphs: 42 %
MCH: 27.6 pg (ref 26.6–33.0)
MCHC: 31.5 g/dL (ref 31.5–35.7)
MCV: 88 fL (ref 79–97)
Monocytes Absolute: 0.8 10*3/uL (ref 0.1–0.9)
Monocytes: 9 %
Neutrophils Absolute: 4 10*3/uL (ref 1.4–7.0)
Neutrophils: 45 %
Platelets: 241 10*3/uL (ref 150–450)
RBC: 3.92 x10E6/uL (ref 3.77–5.28)
RDW: 12.1 % (ref 11.7–15.4)
WBC: 8.8 10*3/uL (ref 3.4–10.8)

## 2020-02-04 LAB — COMPREHENSIVE METABOLIC PANEL
ALT: 35 IU/L — ABNORMAL HIGH (ref 0–32)
AST: 26 IU/L (ref 0–40)
Albumin/Globulin Ratio: 1.9 (ref 1.2–2.2)
Albumin: 3.7 g/dL — ABNORMAL LOW (ref 3.8–4.9)
Alkaline Phosphatase: 113 IU/L (ref 44–121)
BUN/Creatinine Ratio: 67 — ABNORMAL HIGH (ref 9–23)
BUN: 24 mg/dL (ref 6–24)
Bilirubin Total: 0.2 mg/dL (ref 0.0–1.2)
CO2: 28 mmol/L (ref 20–29)
Calcium: 9.4 mg/dL (ref 8.7–10.2)
Chloride: 105 mmol/L (ref 96–106)
Creatinine, Ser: 0.36 mg/dL — ABNORMAL LOW (ref 0.57–1.00)
GFR calc Af Amer: 136 mL/min/{1.73_m2} (ref >59)
GFR calc non Af Amer: 118 mL/min/{1.73_m2} (ref >59)
Globulin, Total: 1.9 g/dL (ref 1.5–4.5)
Glucose: 113 mg/dL — ABNORMAL HIGH (ref 65–99)
Potassium: 3.8 mmol/L (ref 3.5–5.2)
Sodium: 142 mmol/L (ref 134–144)
Total Protein: 5.6 g/dL — ABNORMAL LOW (ref 6.0–8.5)

## 2020-02-04 LAB — TSH: TSH: <0.005 u[IU]/mL — ABNORMAL LOW (ref 0.450–4.500)

## 2020-02-04 LAB — CK: Total CK: 37 U/L (ref 32–182)

## 2020-02-04 NOTE — Addendum Note (Signed)
Addended by: Inis Sizer D on: 02/04/2020 10:16 AM   Modules accepted: Orders

## 2020-02-04 NOTE — Telephone Encounter (Signed)
[facility] UMR order sent to GI. No auth they will reach out to the patient to schedule.

## 2020-02-08 ENCOUNTER — Other Ambulatory Visit: Payer: Self-pay | Admitting: *Deleted

## 2020-02-08 DIAGNOSIS — E0590 Thyrotoxicosis, unspecified without thyrotoxic crisis or storm: Secondary | ICD-10-CM

## 2020-02-08 LAB — T3: T3, Total: 321 ng/dL — ABNORMAL HIGH (ref 71–180)

## 2020-02-08 LAB — T3 UPTAKE
Free Thyroxine Index: 5.4 — ABNORMAL HIGH (ref 1.2–4.9)
T3 Uptake Ratio: 44 % — ABNORMAL HIGH (ref 24–39)

## 2020-02-08 LAB — SPECIMEN STATUS REPORT

## 2020-02-08 LAB — T4: T4, Total: 12.2 ug/dL — ABNORMAL HIGH (ref 4.5–12.0)

## 2020-02-22 ENCOUNTER — Encounter: Payer: Self-pay | Admitting: Endocrinology

## 2020-02-22 NOTE — Progress Notes (Signed)
Patient ID: [name], female   DOB: 1960-08-26, 59 y.o.   MRN: [id number]                                                                                                               Reason for Appointment:  Hyperthyroidism, new consultation  Referring healthcare provider: Dr. Arlice Colt   Chief complaint: Fast heart rate   History of Present Illness:   For the last  4 weeks patient has had symptoms of palpitations, some dizziness, feeling excessively warm and sweaty, shakiness of her legs, and weight loss The patient has lost 15 about  lbs since these symptoms started Apparently she had gastroenteritis-like symptoms for about 5 days right after her Covid booster shot on 01/20/2020 also associated with significant malaise and weakness  She was also feeling like her heart was racing and her breathing was difficult Despite her normal appetite she has lost weight Labs checked by her MS specialist indicated hyperthyroidism as of 02/03/2020 She is now referred for further management  Wt Readings from Last 3 Encounters:  02/23/20 158 lb 6.4 oz (71.8 kg)  02/03/20 164 lb 5 oz (74.5 kg)  08/11/19 178 lb 12.8 oz (81.1 kg)     Treatments so far: None  Thyroid function tests as follows:   02/03/2020: Total T4 is 12.2  and total T3 321, high  Lab Results  Component Value Date   TSH <0.005 (L) 02/03/2020   TSH 1.220 10/29/2017    No results found for: THYROTRECAB   Allergies as of 02/23/2020      Reactions   Methylprednisolone Hives   Solu-medrol [methylprednisolone Sodium Succ] Hives, Itching   Cephalexin Hives, Itching      Medication List       Accurate as of February 23, 2020  2:37 PM. If you have any questions, ask your nurse or doctor.        dexamethasone 4 MG tablet Commonly known as: DECADRON 6 pills po x 1 day, then 5 pills po x 1 day, then 4 pills po x 1 day, then 3 pills po x 1 day, then 2 pills po x 1 day, then 1 pill po x 1 day   diclofenac 75 MG EC  tablet Commonly known as: VOLTAREN Take 1 tablet (75 mg total) by mouth 2 (two) times daily.   FLUoxetine 20 MG capsule Commonly known as: PROZAC TAKE 3 CAPSULES (60 MG TOTAL) BY MOUTH DAILY   hyoscyamine 0.125 MG tablet Commonly known as: LEVSIN Take 1 tablet (0.125 mg total) by mouth every 4 (four) hours as needed.   lisdexamfetamine 60 MG capsule Commonly known as: VYVANSE Take 1 capsule (60 mg total) by mouth every morning.   pantoprazole 40 MG tablet Commonly known as: PROTONIX Take 40 mg by mouth daily.   VUMERITY PO Take by mouth.   Vumerity 231 MG Cpdr Generic drug: Diroximel Fumarate Take 462 mg by mouth 2 (two) times daily.           Past Medical  History:  Diagnosis Date  . ADD (attention deficit disorder with hyperactivity)   . Anxiety   . Arthritis   . Cancer ([place])    Skin cancer- basal cell, squamous cell  . Cervical dysplasia   . GERD (gastroesophageal reflux disease)   . Headache    migraine  . Hepatitis    Pt states Core and surface antibioties for Hepatitis whens he was 48  . Mitral valve prolapse   . Multiple sclerosis ([place])   . Multiple sclerosis ([place])   . Tendonitis in rigtht wrist     Past Surgical History:  Procedure Laterality Date  . ABDOMINAL HYSTERECTOMY  2004   ovaries retained  . BACK SURGERY    . heart ablation     15 yrs ago for a fib by Dr.Klein, everything ok  . KNEE ARTHROSCOPY WITH SUBCHONDROPLASTY Left 11/13/2017   Procedure: LEFT KNEE ARTHROSCOPY WITH SUBCHONDROPLASTY, PARTIAL MEDIAL MENISCECTOMY, SYNOVECTOMY;  Surgeon: Leandrew Koyanagi, MD;  Location: [place];  Service: Orthopedics;  Laterality: Left;  . TUBAL LIGATION      Family History  Problem Relation Age of Onset  . Depression Mother   . Stroke Mother   . Heart disease Mother   . Dementia Mother   . Cancer Neg Hx   . Diabetes Neg Hx     Social History:  reports that she has been smoking cigarettes. She has been smoking about 0.25 packs per  day. She has never used smokeless tobacco. She reports current alcohol use. She reports that she does not use drugs.  Allergies:  Allergies  Allergen Reactions  . Methylprednisolone Hives  . Solu-Medrol [Methylprednisolone Sodium Succ] Hives and Itching  . Cephalexin Hives and Itching     Review of Systems  Constitutional: Positive for weight loss. Negative for reduced appetite.  HENT: Negative for headaches.   Eyes: Negative for double vision.  Respiratory: Positive for shortness of breath.   Cardiovascular: Positive for palpitations.  Gastrointestinal: Negative for constipation, diarrhea and abdominal pain.  Endocrine: Positive for general weakness and heat intolerance.  Genitourinary: Positive for nocturia.  Musculoskeletal: Positive for muscle aches.  Skin: Negative for itching.  Neurological: Positive for weakness and tremors. Negative for balance difficulty.  Psychiatric/Behavioral: Negative for nervousness.       Examination:   BP 130/72 (BP Location: Left Arm, Patient Position: Sitting, Cuff Size: Normal)   Pulse (!) 127   Ht 5' 7.5" (1.715 m)   Wt 158 lb 6.4 oz (71.8 kg)   SpO2 97%   BMI 24.44 kg/m    General Appearance:  well-built and nourished, pleasant, not anxious or hyperkinetic.         Eyes:  She has bilateral [place] No significant proptosis She has mild lid lag Minimal swelling of the eyelids present No diplopia on lateral gaze either direction   Neck: The thyroid is not palpable  There is no lymphadenopathy in the neck .           Heart: normal S1 and S2, no murmurs .          Lungs: breath sounds are normal bilaterally without added sounds  Abdomen: no hepatosplenomegaly or other palpable abnormality  Extremities: hands are warm. No ankle edema.  Neurological:  No fine tremors are present. Deep tendon reflexes at biceps are brisk.  Skin: No rash, abnormal thickening of the skin on the lower legs seen     Assessment/Plan:    Hyperthyroidism, Likely to be from [facility]  disease   She has typical symptoms and signs of Graves' disease Has mild high signs likely from Graves' disease but no proptosis or ocular muscle dysfunction Currently thyroid is not palpable Thyroid levels are normal moderately increased especially T3   Discussed with the patient the hyperthyroidism is a result of an autoimmune condition involving the thyroid.  Explained that the options for treatment are basically antithyroid drugs and radioactive iodine.  Discussed the pros and cons for each treatment: Antithyroid drugs would be reasonable for mild disease but would need frequent followup with lab monitoring as well as potential for side effects from the medications and uncertainty about long-term cure of the problem Discussed that I-131 treatment is safe and simple to do but will result in long-term hypothyroidism that will result from ablation of the thyroid tissue and the need for lifelong supplementation and periodic monitoring.  Clinically would be appropriate for the patient to be treated with antithyroid drugs at this time with methimazole If it becomes evident that her dosage requirement is high or not being able to taper down her dose within the next 3 months we will consider I-131  Patient handout on hyperthyroidism given  She will start with 10 mg twice daily of methimazole, discussed how this works, possible side effects and dosage regimen  For heart rate control she will start 50 mg of atenolol which she can take twice a day if needed for heart rate control  Patient understands the above discussion and treatment options. All questions were answered satisfactorily  Consult note sent to referring physician  Elayne Snare 02/23/2020, 2:37 PM    Note: This office note was prepared with Dragon voice recognition system technology. Any transcriptional errors that result from this process are unintentional.

## 2020-02-23 ENCOUNTER — Ambulatory Visit: Payer: 59 | Admitting: Endocrinology

## 2020-02-23 ENCOUNTER — Other Ambulatory Visit: Payer: Self-pay | Admitting: Endocrinology

## 2020-02-23 ENCOUNTER — Encounter: Payer: Self-pay | Admitting: Endocrinology

## 2020-02-23 ENCOUNTER — Other Ambulatory Visit: Payer: Self-pay

## 2020-02-23 VITALS — BP 130/72 | HR 127 | Ht 67.5 in | Wt 158.4 lb

## 2020-02-23 DIAGNOSIS — E0590 Thyrotoxicosis, unspecified without thyrotoxic crisis or storm: Secondary | ICD-10-CM | POA: Diagnosis not present

## 2020-02-23 LAB — CBC WITH DIFFERENTIAL/PLATELET
Basophils Absolute: 0.1 10*3/uL (ref 0.0–0.1)
Basophils Relative: 0.8 % (ref 0.0–3.0)
Eosinophils Absolute: 0.2 10*3/uL (ref 0.0–0.7)
Eosinophils Relative: 2.8 % (ref 0.0–5.0)
HCT: 34.4 % — ABNORMAL LOW (ref 36.0–46.0)
Hemoglobin: 11.6 g/dL — ABNORMAL LOW (ref 12.0–15.0)
Lymphocytes Relative: 39.7 % (ref 12.0–46.0)
Lymphs Abs: 3.1 10*3/uL (ref 0.7–4.0)
MCHC: 33.7 g/dL (ref 30.0–36.0)
MCV: 84.2 fl (ref 78.0–100.0)
Monocytes Absolute: 1 10*3/uL (ref 0.1–1.0)
Monocytes Relative: 12.1 % — ABNORMAL HIGH (ref 3.0–12.0)
Neutro Abs: 3.5 10*3/uL (ref 1.4–7.7)
Neutrophils Relative %: 44.6 % (ref 43.0–77.0)
Platelets: 263 10*3/uL (ref 150.0–400.0)
RBC: 4.09 Mil/uL (ref 3.87–5.11)
RDW: 12.8 % (ref 11.5–15.5)
WBC: 7.9 10*3/uL (ref 4.0–10.5)

## 2020-02-23 LAB — T4, FREE: Free T4: 3.95 ng/dL — ABNORMAL HIGH (ref 0.60–1.60)

## 2020-02-23 MED ORDER — METHIMAZOLE 10 MG PO TABS: 10.0000 mg | ORAL_TABLET | Freq: Two times a day (BID) | ORAL | 1 refills | Status: DC

## 2020-02-23 MED ORDER — ATENOLOL 50 MG PO TABS: 50.0000 mg | ORAL_TABLET | Freq: Every day | ORAL | 1 refills | Status: DC

## 2020-02-23 MED FILL — methIMAzole 10 MG TABS: 10 | 30 days supply | Qty: 60 | Fill #0

## 2020-02-23 MED FILL — ATENOLOL 50 MG TABLET: 50 | 30 days supply | Qty: 30 | Fill #0

## 2020-02-24 LAB — THYROTROPIN RECEPTOR AUTOABS: Thyrotropin Receptor Ab: 15.3 IU/L — ABNORMAL HIGH (ref 0.00–1.75)

## 2020-02-24 NOTE — Progress Notes (Signed)
Antibody test confirms Graves' disease.  Thyroid levels are significantly high.  To continue methimazole 10 mg twice a day as prescribed.  Anemia slightly better

## 2020-02-25 ENCOUNTER — Telehealth: Payer: Self-pay

## 2020-02-25 NOTE — Telephone Encounter (Signed)
New message   Returning call back from the office.

## 2020-02-25 NOTE — Progress Notes (Signed)
Left a vm for patient to callback regarding lab results 

## 2020-03-01 ENCOUNTER — Encounter: Payer: Self-pay | Admitting: Orthopaedic Surgery

## 2020-03-03 ENCOUNTER — Ambulatory Visit: Payer: 59 | Admitting: Orthopaedic Surgery

## 2020-03-03 ENCOUNTER — Encounter: Payer: Self-pay | Admitting: Orthopaedic Surgery

## 2020-03-03 ENCOUNTER — Ambulatory Visit (INDEPENDENT_AMBULATORY_CARE_PROVIDER_SITE_OTHER): Payer: 59

## 2020-03-03 ENCOUNTER — Other Ambulatory Visit (HOSPITAL_COMMUNITY): Payer: Self-pay | Admitting: Orthopaedic Surgery

## 2020-03-03 VITALS — Ht 67.5 in | Wt 155.0 lb

## 2020-03-03 DIAGNOSIS — M25512 Pain in left shoulder: Secondary | ICD-10-CM

## 2020-03-03 MED ORDER — MELOXICAM 7.5 MG PO TABS: 7.5000 mg | ORAL_TABLET | Freq: Two times a day (BID) | ORAL | 2 refills | Status: DC | PRN

## 2020-03-03 MED ORDER — PREDNISONE 10 MG (21) PO TBPK: ORAL_TABLET | ORAL | 0 refills | Status: DC

## 2020-03-03 MED FILL — predniSONE 10 MG TABS: 10 | 6 days supply | Qty: 21 | Fill #0

## 2020-03-03 MED FILL — MELOXICAM 7.5 MG TABLET: 7.5 | 15 days supply | Qty: 30 | Fill #0

## 2020-03-03 NOTE — Progress Notes (Signed)
Office Visit Note   Patient: [name]           Date of Birth: February 27, 1961           MRN: [id number] Visit Date: 03/03/2020              Requested by: No referring provider defined for this encounter. PCP: Patient, No Pcp Per   Assessment & Plan: Visit Diagnoses:  1. Acute pain of left shoulder     Plan: Impression is left shoulder contusion possible rotator cuff injury.  I would like to prescribe some prednisone and meloxicam to see if things will settle down as the exam is difficult with this much pain.  I would like to recheck her in 2 weeks to see how she is doing at that time.  She will let me know if she does not notice any improvement at all in a week.  May need to consider MRI at that time.  Follow-Up Instructions: Return in about 2 weeks (around 03/17/2020).   Orders:  Orders Placed This Encounter  Procedures  . XR Shoulder Left   Meds ordered this encounter  Medications  . meloxicam (MOBIC) 7.5 MG tablet    Sig: Take 1 tablet (7.5 mg total) by mouth 2 (two) times daily as needed for pain.    Dispense:  30 tablet    Refill:  2  . predniSONE (STERAPRED UNI-PAK 21 TAB) 10 MG (21) TBPK tablet    Sig: Take as directed    Dispense:  21 tablet    Refill:  0      Procedures: No procedures performed   Clinical Data: No additional findings.   Subjective: Chief Complaint  Patient presents with  . Left Shoulder - Pain    [name] is a 59 year old female who is well-known to me comes in for evaluation of acute left shoulder pain from a mechanical fall that she suffered 3 days ago.  She has leg weakness resulting from her multiple sclerosis.  She has limited movement of the left shoulder due to the pain.  She has been taking Tylenol and ibuprofen.  She has some tingling in the upper arm.   Review of Systems  Constitutional: Negative.   HENT: Negative.   Eyes: Negative.   Respiratory: Negative.   Cardiovascular: Negative.   Endocrine: Negative.     Musculoskeletal: Negative.   Neurological: Negative.   Hematological: Negative.   Psychiatric/Behavioral: Negative.   All other systems reviewed and are negative.    Objective: Vital Signs: Ht 5' 7.5" (1.715 m)   Wt 155 lb (70.3 kg)   BMI 23.92 kg/m   Physical Exam Vitals and nursing note reviewed.  Constitutional:      Appearance: She is well-developed.  Pulmonary:     Effort: Pulmonary effort is normal.  Skin:    General: Skin is warm.     Capillary Refill: Capillary refill takes less than 2 seconds.  Neurological:     Mental Status: She is alert and oriented to person, place, and time.  Psychiatric:        Behavior: Behavior normal.        Thought Content: Thought content normal.        Judgment: Judgment normal.     Ortho Exam Left shoulder exam shows no tenderness along the clavicle.  There is no swelling or bruises or skin lesions.  She does not have any shrug compensation.  Passive range of motion produces moderate to severe pain.  She is able to actively elevate her arm above her head with moderate pain.  Neurovascular intact. Specialty Comments:  No specialty comments available.  Imaging: XR Shoulder Left  Result Date: 03/03/2020 No acute or structural abnormalities    PMFS History: Patient Active Problem List   Diagnosis Date Noted  . Low grade fever 02/03/2020  . Myalgia 02/03/2020  . Vision disturbance 02/03/2020  . Incontinence of feces 02/03/2020  . Acute medial meniscus tear of right knee 06/30/2019  . Primary osteoarthritis of right knee 06/30/2019  . Unilateral primary osteoarthritis, left knee 02/12/2018  . Pain in left hip 01/28/2018  . 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 Acute medial meniscus tear 11/05/2017  . Synovitis of left knee 11/05/2017  . Insufficiency fracture of tibia 11/05/2017  . Effusion, left knee 10/23/2017  . Left knee pain 10/20/2017  . Right leg weakness 12/19/2016  . Acute upper respiratory infection  03/13/2016  . Cervical radiculitis 11/29/2015  . Numbness 11/29/2015  . Squamous cell carcinoma 05/25/2015  . Neck pain on right side 05/03/2015  . 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 Ataxic gait 11/10/2014  . High risk medication use 11/10/2014  . Urinary frequency 11/10/2014  . Right carpal tunnel syndrome 11/10/2014  . Influenza-like symptoms 02/03/2014  . Allergic rhinitis 09/14/2013  . ADD (attention deficit disorder) 03/10/2012  . Tobacco use 09/10/2011  . Visit for preventive health examination 09/10/2011  . MS (multiple sclerosis) ([place]) 09/04/2011   Past Medical History:  Diagnosis Date  . ADD (attention deficit disorder with hyperactivity)   . Anxiety   . Arthritis   . Cancer ([place])    Skin cancer- basal cell, squamous cell  . Cervical dysplasia   . GERD (gastroesophageal reflux disease)   . Headache    migraine  . Hepatitis    Pt states Core and surface antibioties for Hepatitis whens he was 41  . Mitral valve prolapse   . Multiple sclerosis ([place])   . Multiple sclerosis ([place])   . Tendonitis in rigtht wrist     Family History  Problem Relation Age of Onset  . Depression Mother   . Stroke Mother   . Heart disease Mother   . Dementia Mother   . Hypothyroidism Mother   . Cancer Neg Hx   . Diabetes Neg Hx     Past Surgical History:  Procedure Laterality Date  . ABDOMINAL HYSTERECTOMY  2004   ovaries retained  . BACK SURGERY    . heart ablation     15 yrs ago for a fib by Dr.Klein, everything ok  . KNEE ARTHROSCOPY WITH SUBCHONDROPLASTY Left 11/13/2017   Procedure: LEFT KNEE ARTHROSCOPY WITH SUBCHONDROPLASTY, PARTIAL MEDIAL MENISCECTOMY, SYNOVECTOMY;  Surgeon: Leandrew Koyanagi, MD;  Location: [facility];  Service: Orthopedics;  Laterality: Left;  . TUBAL LIGATION     Social History   Occupational History  . Not on file  Tobacco Use  . Smoking status: Current Some Day Smoker    Packs/day: 0.25    Types: Cigarettes  . Smokeless tobacco: Never Used    Vaping Use  . Vaping Use: Never used  Substance and Sexual Activity  . Alcohol use: Yes    Alcohol/week: 0.0 standard drinks    Comment: Occasioanl glass of wine   . Drug use: No  . Sexual activity: Yes    Birth control/protection: Surgical

## 2020-03-08 ENCOUNTER — Other Ambulatory Visit (HOSPITAL_COMMUNITY): Payer: Self-pay | Admitting: Psychiatry

## 2020-03-08 ENCOUNTER — Telehealth (INDEPENDENT_AMBULATORY_CARE_PROVIDER_SITE_OTHER): Payer: 59 | Admitting: Psychiatry

## 2020-03-08 ENCOUNTER — Other Ambulatory Visit: Payer: Self-pay

## 2020-03-08 ENCOUNTER — Encounter (HOSPITAL_COMMUNITY): Payer: Self-pay | Admitting: Psychiatry

## 2020-03-08 VITALS — Wt 155.0 lb

## 2020-03-08 DIAGNOSIS — F330 Major depressive disorder, recurrent, mild: Secondary | ICD-10-CM | POA: Diagnosis not present

## 2020-03-08 DIAGNOSIS — F900 Attention-deficit hyperactivity disorder, predominantly inattentive type: Secondary | ICD-10-CM | POA: Diagnosis not present

## 2020-03-08 MED ORDER — LISDEXAMFETAMINE DIMESYLATE 60 MG PO CAPS: 60.0000 mg | ORAL_CAPSULE | ORAL | 0 refills | Status: DC

## 2020-03-08 MED FILL — VYVANSE 60 MG CAPSULE: 60 | 90 days supply | Qty: 90 | Fill #0

## 2020-03-08 NOTE — Progress Notes (Signed)
Virtual Visit via Telephone Note  I connected with [name] on 03/08/20 at  4:00 PM EDT by telephone and verified that I am speaking with the correct person using two identifiers.  Location: Patient: home Provider: home office   I discussed the limitations, risks, security and privacy concerns of performing an evaluation and management service by telephone and the availability of in person appointments. I also discussed with the patient that there may be a patient responsible charge related to this service. The patient expressed understanding and agreed to proceed.   History of Present Illness: Patient is evaluated by phone session.  Patient told last few months very difficult because she having a lot of physical symptoms which she described leg and arm weakness.  She was very tired and have no energy.  She had blood work and found to have that she has hyperparathyroidism.  She also having falls and reaction with the booster vaccine for COVID.  Slowly and gradually she is getting better.  She is now taking antihypothyroid medicine and slowly and gradually her strength of leg and arm is coming back.  She was out of work for a few days and now back to work.  She is scheduled to see her endocrinologist for thyroid level checked.  She feels that her depression is a stable despite home dose health concerns.  She is able to do multitasking and her attention concentration is better with the Vyvanse.  Some nights she has difficulty sleeping.  She is seeing neurologist and endocrinologist on a regular basis.  She denies any crying spells or any feeling of hopelessness or worthlessness.  She was thought because she did not have symptoms of hyperthyroidism until she had a blood work.  Patient like to keep her Vyvanse from our office and she is also taking Prozac 60 mg from neurologist.  She has no concerns or side effects of the medication.  Her job is going okay.  Past Psychiatric History: H/Odepression  and ADD. Tried Zoloft,Focalin, Ritalin and Strattera. No history of inpatient or any suicidal attempt.  Recent Results (from the past 2160 hour(s))  CK     Status: None   Collection Time: 02/03/20  9:21 AM  Result Value Ref Range   Total CK 37 32.0 - 182.0 U/L  CBC with Differential/Platelet     Status: Abnormal   Collection Time: 02/03/20  9:21 AM  Result Value Ref Range   WBC 8.8 3.4 - 10.8 x10E3/uL   RBC 3.92 3.77 - 5.28 x10E6/uL   Hemoglobin 10.8 (L) 11.1 - 15.9 g/dL   Hematocrit 34.3 34.0 - 46.6 %   MCV 88 79 - 97 fL   MCH 27.6 26.6 - 33.0 pg   MCHC 31.5 31 - 35 g/dL   RDW 12.1 11.7 - 15.4 %   Platelets 241 150 - 450 x10E3/uL   Neutrophils 45 Not Estab. %   Lymphs 42 Not Estab. %   Monocytes 9 Not Estab. %   Eos 3 Not Estab. %   Basos 1 Not Estab. %   Neutrophils Absolute 4.0 1.40 - 7.00 x10E3/uL   Lymphocytes Absolute 3.7 (H) 0 - 3 x10E3/uL   Monocytes Absolute 0.8 0 - 0 x10E3/uL   EOS (ABSOLUTE) 0.2 0.0 - 0.4 x10E3/uL   Basophils Absolute 0.1 0 - 0 x10E3/uL   Immature Granulocytes 0 Not Estab. %   Immature Grans (Abs) 0.0 0.0 - 0.1 x10E3/uL  Comprehensive metabolic panel     Status: Abnormal  Collection Time: 02/03/20  9:21 AM  Result Value Ref Range   Glucose 113 (H) 65 - 99 mg/dL   BUN 24 6 - 24 mg/dL    Comment: **Verified by repeat analysis**   Creatinine, Ser 0.36 (L) 0.57 - 1.00 mg/dL    Comment: **Verified by repeat analysis**   GFR calc non Af Amer 118 >59 mL/min/1.73   GFR calc Af Amer 136 >59 mL/min/1.73    Comment: **[facility] currently reports eGFR in compliance with the current**   recommendations of the [employer]. [facility] will   update reporting as new guidelines are published from the NKF-ASN   Task force.    BUN/Creatinine Ratio 67 (H) 9 - 23   Sodium 142 134 - 144 mmol/L   Potassium 3.8 3.5 - 5.2 mmol/L   Chloride 105 96 - 106 mmol/L   CO2 28 20 - 29 mmol/L   Calcium 9.4 8.7 - 10.2 mg/dL   Total Protein 5.6 (L) 6.0 - 8.5  g/dL   Albumin 3.7 (L) 3.8 - 4.9 g/dL   Globulin, Total 1.9 1.5 - 4.5 g/dL   Albumin/Globulin Ratio 1.9 1.2 - 2.2   Bilirubin Total 0.2 0.0 - 1.2 mg/dL   Alkaline Phosphatase 113 44 - 121 IU/L    Comment:               **Please note reference interval change**   AST 26 0 - 40 IU/L   ALT 35 (H) 0 - 32 IU/L  TSH     Status: Abnormal   Collection Time: 02/03/20  9:21 AM  Result Value Ref Range   TSH <0.005 (L) 0.450 - 4.500 uIU/mL  T4     Status: Abnormal   Collection Time: 02/03/20  9:21 AM  Result Value Ref Range   T4, Total 12.2 (H) 4.5 - 12.0 ug/dL  T3 uptake     Status: Abnormal   Collection Time: 02/03/20  9:21 AM  Result Value Ref Range   T3 Uptake Ratio 44 (H) 24 - 39 %   Free Thyroxine Index 5.4 (H) 1.2 - 4.9  T3     Status: Abnormal   Collection Time: 02/03/20  9:21 AM  Result Value Ref Range   T3, Total 321 (H) 71 - 180 ng/dL  Specimen status report     Status: None   Collection Time: 02/03/20  9:21 AM  Result Value Ref Range   specimen status report Comment     Comment: Written Authorization Written Authorization Written Authorization Received. Authorization received from [employer] 02-08-2020 Logged by Marchelle Gearing   CBC with Differential/Platelet     Status: Abnormal   Collection Time: 02/23/20  3:10 PM  Result Value Ref Range   WBC 7.9 4.0 - 10.5 K/uL   RBC 4.09 3.87 - 5.11 Mil/uL   Hemoglobin 11.6 (L) 12.0 - 15.0 g/dL   HCT 34.4 (L) 36 - 46 %   MCV 84.2 78.0 - 100.0 fl   MCHC 33.7 30.0 - 36.0 g/dL   RDW 12.8 11.5 - 15.5 %   Platelets 263.0 150 - 400 K/uL   Neutrophils Relative % 44.6 43 - 77 %   Lymphocytes Relative 39.7 12 - 46 %   Monocytes Relative 12.1 (H) 3 - 12 %   Eosinophils Relative 2.8 0 - 5 %   Basophils Relative 0.8 0 - 3 %   Neutro Abs 3.5 1.4 - 7.7 K/uL   Lymphs Abs 3.1 0.7 - 4.0 K/uL  Monocytes Absolute 1.0 0.1 - 1.0 K/uL   Eosinophils Absolute 0.2 0.0 - 0.7 K/uL   Basophils Absolute 0.1 0.0 - 0.1 K/uL  T4, free     Status:  Abnormal   Collection Time: 02/23/20  3:10 PM  Result Value Ref Range   Free T4 3.95 (H) 0.60 - 1.60 ng/dL    Comment: Specimens from patients who are undergoing biotin therapy and /or ingesting biotin supplements may contain high levels of biotin.  The higher biotin concentration in these specimens interferes with this Free T4 assay.  Specimens that contain high levels  of biotin may cause false high results for this Free T4 assay.  Please interpret results in light of the total clinical presentation of the patient.    Thyrotropin receptor autoabs     Status: Abnormal   Collection Time: 02/23/20  3:10 PM  Result Value Ref Range   Thyrotropin Receptor Ab 15.30 (H) 0.00 - 1.75 IU/L    Psychiatric Specialty Exam: Physical Exam  Review of Systems  Weight 155 lb (70.3 kg).There is no height or weight on file to calculate BMI.  General Appearance: NA  Eye Contact:  NA  Speech:  Clear and Coherent  Volume:  Normal  Mood:  Euthymic  Affect:  NA  Thought Process:  Goal Directed  Orientation:  Full (Time, Place, and Person)  Thought Content:  Rumination  Suicidal Thoughts:  No  Homicidal Thoughts:  No  Memory:  Immediate;   Good Recent;   Good Remote;   Good  Judgement:  Intact  Insight:  Present  Psychomotor Activity:  NA  Concentration:  Concentration: Good and Attention Span: Good  Recall:  Good  Fund of Knowledge:  Good  Language:  Good  Akathisia:  No  Handed:  Right  AIMS (if indicated):     Assets:  Communication Skills Desire for Improvement Housing Talents/Skills Transportation  ADL's:  Intact  Cognition:  WNL  Sleep:         Assessment and Plan: Attention deficit disorder, inattentive type.  Major depressive disorder, recurrent.     Reviewed blood work results.  Her depression is a stable on Prozac 60 mg and her attention focus is better with the Vyvanse 60 mg which she takes every day.  Discussed medication side effects and benefits.  Recommended to call us  back if she has any question or any concern.  Follow-up in 3 months.  Follow Up Instructions:    I discussed the assessment and treatment plan with the patient. The patient was provided an opportunity to ask questions and all were answered. The patient agreed with the plan and demonstrated an understanding of the instructions.   The patient was advised to call back or seek an in-person evaluation if the symptoms worsen or if the condition fails to improve as anticipated.  I provided 16 minutes of non-face-to-face time during this encounter.   Kathlee Nations, MD

## 2020-03-17 ENCOUNTER — Telehealth: Payer: Self-pay | Admitting: *Deleted

## 2020-03-17 ENCOUNTER — Encounter: Payer: Self-pay | Admitting: Orthopaedic Surgery

## 2020-03-17 ENCOUNTER — Ambulatory Visit: Payer: 59 | Admitting: Orthopaedic Surgery

## 2020-03-17 DIAGNOSIS — M25512 Pain in left shoulder: Secondary | ICD-10-CM

## 2020-03-17 DIAGNOSIS — R002 Palpitations: Secondary | ICD-10-CM

## 2020-03-17 DIAGNOSIS — R0600 Dyspnea, unspecified: Secondary | ICD-10-CM

## 2020-03-17 DIAGNOSIS — R0609 Other forms of dyspnea: Secondary | ICD-10-CM

## 2020-03-17 NOTE — Telephone Encounter (Signed)
RE: pt needs echo and EKG nurse visit per Dr. Meda Coffee Received: Today Dario Guardian, LPN I have called patient and had to leave message. I will be out tomorrow but back in on Monday but I told her she cold call the office to schedule. Have a great weekend!

## 2020-03-17 NOTE — Telephone Encounter (Signed)
Order for echo placed, as advised by Dr. Meda Coffee.  Will send [facility] scheduling a message to call the pt back and arrange echo and EKG nurse visit, for complaints mentioned.

## 2020-03-17 NOTE — Progress Notes (Signed)
Office Visit Note   Patient: [name]           Date of Birth: June 22, 1960           MRN: [id number] Visit Date: 03/17/2020              Requested by: No referring provider defined for this encounter. PCP: Patient, No Pcp Per   Assessment & Plan: Visit Diagnoses:  1. Acute pain of left shoulder     Plan: Overall [name] is improving.  Based on our discussion of options she would like to give this a little bit more time and recheck in 3 weeks.  She will refill her Mobic and I have provided her with some home exercises to try to do if her symptoms allow.  Follow-Up Instructions: Return in about 3 weeks (around 04/07/2020).   Orders:  No orders of the defined types were placed in this encounter.  No orders of the defined types were placed in this encounter.     Procedures: No procedures performed   Clinical Data: No additional findings.   Subjective: Chief Complaint  Patient presents with  . Left Shoulder - Pain    [name] returns today for follow-up of left shoulder pain.  Overall doing better and states that she is about 65% better.  Mobic is helping.  She endorses point tenderness to the mid humeral shaft.   Review of Systems   Objective: Vital Signs: There were no vitals taken for this visit.  Physical Exam  Ortho Exam Left shoulder range of motion is improving but still limited secondary to pain.  Strength is grossly intact slightly limited secondary to pain.  She has tender to the midshaft of the humerus.  There is no bony crepitus or gross motion. Specialty Comments:  No specialty comments available.  Imaging: No results found.   PMFS History: Patient Active Problem List   Diagnosis Date Noted  . Low grade fever 02/03/2020  . Myalgia 02/03/2020  . Vision disturbance 02/03/2020  . Incontinence of feces 02/03/2020  . Acute medial meniscus tear of right knee 06/30/2019  . Primary osteoarthritis of right knee 06/30/2019  . Unilateral primary  osteoarthritis, left knee 02/12/2018  . Pain in left hip 01/28/2018  . 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 Acute medial meniscus tear 11/05/2017  . Synovitis of left knee 11/05/2017  . Insufficiency fracture of tibia 11/05/2017  . Effusion, left knee 10/23/2017  . Left knee pain 10/20/2017  . Right leg weakness 12/19/2016  . Acute upper respiratory infection 03/13/2016  . Cervical radiculitis 11/29/2015  . Numbness 11/29/2015  . Squamous cell carcinoma 05/25/2015  . Neck pain on right side 05/03/2015  . 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 Ataxic gait 11/10/2014  . High risk medication use 11/10/2014  . Urinary frequency 11/10/2014  . Right carpal tunnel syndrome 11/10/2014  . Influenza-like symptoms 02/03/2014  . Allergic rhinitis 09/14/2013  . ADD (attention deficit disorder) 03/10/2012  . Tobacco use 09/10/2011  . Visit for preventive health examination 09/10/2011  . MS (multiple sclerosis) ([place]) 09/04/2011   Past Medical History:  Diagnosis Date  . ADD (attention deficit disorder with hyperactivity)   . Anxiety   . Arthritis   . Cancer ([place])    Skin cancer- basal cell, squamous cell  . Cervical dysplasia   . GERD (gastroesophageal reflux disease)   . Headache    migraine  . Hepatitis    Pt states Core and surface antibioties for Hepatitis whens  he was 18  . Mitral valve prolapse   . Multiple sclerosis ([place])   . Multiple sclerosis ([place])   . Tendonitis in rigtht wrist     Family History  Problem Relation Age of Onset  . Depression Mother   . Stroke Mother   . Heart disease Mother   . Dementia Mother   . Hypothyroidism Mother   . Cancer Neg Hx   . Diabetes Neg Hx     Past Surgical History:  Procedure Laterality Date  . ABDOMINAL HYSTERECTOMY  2004   ovaries retained  . BACK SURGERY    . heart ablation     15 yrs ago for a fib by Dr.Klein, everything ok  . KNEE ARTHROSCOPY WITH SUBCHONDROPLASTY Left 11/13/2017   Procedure: LEFT KNEE ARTHROSCOPY WITH  SUBCHONDROPLASTY, PARTIAL MEDIAL MENISCECTOMY, SYNOVECTOMY;  Surgeon: Leandrew Koyanagi, MD;  Location: [facility];  Service: Orthopedics;  Laterality: Left;  . TUBAL LIGATION     Social History   Occupational History  . Not on file  Tobacco Use  . Smoking status: Current Some Day Smoker    Packs/day: 0.25    Types: Cigarettes  . Smokeless tobacco: Never Used  Vaping Use  . Vaping Use: Never used  Substance and Sexual Activity  . Alcohol use: Yes    Alcohol/week: 0.0 standard drinks    Comment: Occasioanl glass of wine   . Drug use: No  . Sexual activity: Yes    Birth control/protection: Surgical

## 2020-03-17 NOTE — Telephone Encounter (Signed)
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 If abnormal I will see her earlier. Thank you, K

## 2020-03-18 NOTE — Telephone Encounter (Signed)
RE: pt needs echo and EKG nurse visit per Dr. Meda Coffee Received: Today Jerlyn Ly, LPN Schedule for [id number] nurse @ 2:15/ echo 2:45

## 2020-03-22 MED FILL — PANTOPRAZOLE SOD DR 40 MG T: 40 | 90 days supply | Qty: 90 | Fill #1

## 2020-03-28 ENCOUNTER — Other Ambulatory Visit: Payer: Self-pay | Admitting: Endocrinology

## 2020-03-28 DIAGNOSIS — E0590 Thyrotoxicosis, unspecified without thyrotoxic crisis or storm: Secondary | ICD-10-CM

## 2020-03-29 ENCOUNTER — Encounter: Payer: Self-pay | Admitting: Orthopaedic Surgery

## 2020-03-31 ENCOUNTER — Other Ambulatory Visit: Payer: Self-pay

## 2020-03-31 ENCOUNTER — Other Ambulatory Visit (INDEPENDENT_AMBULATORY_CARE_PROVIDER_SITE_OTHER): Payer: 59

## 2020-03-31 DIAGNOSIS — E0590 Thyrotoxicosis, unspecified without thyrotoxic crisis or storm: Secondary | ICD-10-CM

## 2020-03-31 DIAGNOSIS — C44329 Squamous cell carcinoma of skin of other parts of face: Secondary | ICD-10-CM | POA: Diagnosis not present

## 2020-03-31 LAB — COMPREHENSIVE METABOLIC PANEL
ALT: 76 U/L — ABNORMAL HIGH (ref 0–35)
AST: 49 U/L — ABNORMAL HIGH (ref 0–37)
Albumin: 3.9 g/dL (ref 3.5–5.2)
Alkaline Phosphatase: 397 U/L — ABNORMAL HIGH (ref 39–117)
BUN: 21 mg/dL (ref 6–23)
CO2: 31 mEq/L (ref 19–32)
Calcium: 9.1 mg/dL (ref 8.4–10.5)
Chloride: 101 mEq/L (ref 96–112)
Creatinine, Ser: 0.51 mg/dL (ref 0.40–1.20)
GFR: 102.24 mL/min (ref >60.00)
Glucose, Bld: 95 mg/dL (ref 70–99)
Potassium: 4 mEq/L (ref 3.5–5.1)
Sodium: 138 mEq/L (ref 135–145)
Total Bilirubin: 0.6 mg/dL (ref 0.2–1.2)
Total Protein: 6.5 g/dL (ref 6.0–8.3)

## 2020-03-31 LAB — CBC
HCT: 36.4 % (ref 36.0–46.0)
Hemoglobin: 11.8 g/dL — ABNORMAL LOW (ref 12.0–15.0)
MCHC: 32.5 g/dL (ref 30.0–36.0)
MCV: 83.1 fl (ref 78.0–100.0)
Platelets: 305 10*3/uL (ref 150.0–400.0)
RBC: 4.38 Mil/uL (ref 3.87–5.11)
RDW: 15 % (ref 11.5–15.5)
WBC: 7.8 10*3/uL (ref 4.0–10.5)

## 2020-03-31 LAB — T4, FREE: Free T4: 0.43 ng/dL — ABNORMAL LOW (ref 0.60–1.60)

## 2020-03-31 LAB — ALT: ALT: 76 U/L — ABNORMAL HIGH (ref 0–35)

## 2020-03-31 LAB — T3, FREE: T3, Free: 2.9 pg/mL (ref 2.3–4.2)

## 2020-04-05 ENCOUNTER — Other Ambulatory Visit: Payer: Self-pay | Admitting: Endocrinology

## 2020-04-05 ENCOUNTER — Encounter: Payer: Self-pay | Admitting: Endocrinology

## 2020-04-05 ENCOUNTER — Other Ambulatory Visit: Payer: Self-pay

## 2020-04-05 ENCOUNTER — Ambulatory Visit: Payer: 59 | Admitting: Endocrinology

## 2020-04-05 VITALS — BP 112/80 | HR 73 | Ht 68.0 in | Wt 153.8 lb

## 2020-04-05 DIAGNOSIS — E0590 Thyrotoxicosis, unspecified without thyrotoxic crisis or storm: Secondary | ICD-10-CM | POA: Diagnosis not present

## 2020-04-05 DIAGNOSIS — E0500 Thyrotoxicosis with diffuse goiter without thyrotoxic crisis or storm: Secondary | ICD-10-CM | POA: Diagnosis not present

## 2020-04-05 MED ORDER — METHIMAZOLE 5 MG PO TABS
7.5000 mg | ORAL_TABLET | Freq: Every day | ORAL | 1 refills | Status: DC
Start: 2020-04-05 — End: 2020-05-04

## 2020-04-05 MED ORDER — PREDNISONE 10 MG (21) PO TBPK
ORAL_TABLET | ORAL | 0 refills | Status: DC
Start: 2020-04-05 — End: 2020-06-01

## 2020-04-05 MED FILL — methIMAzole 5 MG TABS: 5 | 30 days supply | Qty: 45 | Fill #0

## 2020-04-05 MED FILL — predniSONE 10 MG TABS: 10 | 6 days supply | Qty: 21 | Fill #0

## 2020-04-05 NOTE — Patient Instructions (Addendum)
Take 7.5mg  daily starting 11/20

## 2020-04-05 NOTE — Progress Notes (Signed)
Patient ID: [name], female   DOB: [birth year], 59 y.o.   MRN: [id number]                                                                                                               Reason for Appointment:  Hyperthyroidism, f/u consultation   Chief complaint: Fast heart rate   History of Present Illness:   Baseline history: 4 weeks prior to her initial visit she had symptoms of palpitations, some dizziness, feeling excessively warm and sweaty, shakiness of her legs, and weight loss The patient has had 15 about  lbs since these symptoms started Apparently she had gastroenteritis-like symptoms for about 5 days right after her Covid booster shot on 01/20/2020 also associated with significant malaise and weakness She was also feeling like her heart was racing and her breathing was difficult Despite her normal appetite she has lost weight  Recent history:  Since she had significantly high free T4 of about 4 she was started on methimazole 10 mg twice daily With this she has had resolution of her symptoms of palpitations, breathing difficulty, feeling warm and shakiness She is still has lost a lot of weight, appetite normal She has not taken atenolol recently since her pulse rate has improved  Her main concern is a slight blurring of her right eye and swelling especially in the morning along with some tearing No double vision  Wt Readings from Last 3 Encounters:  04/05/20 153 lb 12.8 oz (69.8 kg)  03/03/20 155 lb (70.3 kg)  02/23/20 158 lb 6.4 oz (71.8 kg)   Her thyroid levels now show a low free T4 of 0.3 and normal T3  02/03/2020: Total T4 = 12.2  and total T3 321, high  Lab Results  Component Value Date   FREET4 0.43 (L) 03/31/2020   FREET4 3.95 (H) 02/23/2020   T3FREE 2.9 03/31/2020   TSH <0.005 (L) 02/03/2020   TSH 1.220 10/29/2017    Lab Results  Component Value Date   THYROTRECAB 15.30 (H) 02/23/2020     Allergies as of 04/05/2020      Reactions    Methylprednisolone Hives   Solu-medrol [methylprednisolone Sodium Succ] Hives, Itching   Cephalexin Hives, Itching      Medication List       Accurate as of April 05, 2020  3:28 PM. If you have any questions, ask your nurse or doctor.        atenolol 50 MG tablet Commonly known as: TENORMIN Take 1 tablet (50 mg total) by mouth daily.   diclofenac 75 MG EC tablet Commonly known as: VOLTAREN Take 1 tablet (75 mg total) by mouth 2 (two) times daily.   FLUoxetine 20 MG capsule Commonly known as: PROZAC TAKE 3 CAPSULES (60 MG TOTAL) BY MOUTH DAILY   hyoscyamine 0.125 MG tablet Commonly known as: LEVSIN Take 1 tablet (0.125 mg total) by mouth every 4 (four) hours as needed.   lisdexamfetamine 60 MG capsule Commonly known as: VYVANSE Take 1 capsule (60 mg total) by  mouth every morning.   meloxicam 7.5 MG tablet Commonly known as: Mobic Take 1 tablet (7.5 mg total) by mouth 2 (two) times daily as needed for pain.   methimazole 10 MG tablet Commonly known as: TAPAZOLE Take 1 tablet (10 mg total) by mouth 2 (two) times daily.   pantoprazole 40 MG tablet Commonly known as: PROTONIX Take 40 mg by mouth daily.   Pfizer-BioNTech COVID-19 Vacc 30 MCG/0.3ML injection Generic drug: COVID-19 mRNA vaccine (Pfizer)   predniSONE 10 MG (21) Tbpk tablet Commonly known as: STERAPRED UNI-PAK 21 TAB Take as directed   VUMERITY PO Take by mouth.   Vumerity 231 MG Cpdr Generic drug: Diroximel Fumarate Take 462 mg by mouth 2 (two) times daily.           Past Medical History:  Diagnosis Date  . ADD (attention deficit disorder with hyperactivity)   . Anxiety   . Arthritis   . Cancer ([place])    Skin cancer- basal cell, squamous cell  . Cervical dysplasia   . GERD (gastroesophageal reflux disease)   . Headache    migraine  . Hepatitis    Pt states Core and surface antibioties for Hepatitis whens he was 62  . Mitral valve prolapse   . Multiple sclerosis ([place])   . Multiple  sclerosis ([place])   . Tendonitis in rigtht wrist     Past Surgical History:  Procedure Laterality Date  . ABDOMINAL HYSTERECTOMY  2004   ovaries retained  . BACK SURGERY    . heart ablation     15 yrs ago for a fib by Dr.Klein, everything ok  . KNEE ARTHROSCOPY WITH SUBCHONDROPLASTY Left 11/13/2017   Procedure: LEFT KNEE ARTHROSCOPY WITH SUBCHONDROPLASTY, PARTIAL MEDIAL MENISCECTOMY, SYNOVECTOMY;  Surgeon: Leandrew Koyanagi, MD;  Location: [place];  Service: Orthopedics;  Laterality: Left;  . TUBAL LIGATION      Family History  Problem Relation Age of Onset  . Depression Mother   . Stroke Mother   . Heart disease Mother   . Dementia Mother   . Hypothyroidism Mother   . Cancer Neg Hx   . Diabetes Neg Hx     Social History:  reports that she has been smoking cigarettes. She has been smoking about 0.25 packs per day. She has never used smokeless tobacco. She reports current alcohol use. She reports that she does not use drugs.  Allergies:  Allergies  Allergen Reactions  . Methylprednisolone Hives  . Solu-Medrol [Methylprednisolone Sodium Succ] Hives and Itching  . Cephalexin Hives and Itching     Review of Systems     Examination:   BP 112/80   Pulse 73   Ht 5\' 8"  (1.727 m)   Wt 153 lb 12.8 oz (69.8 kg)   SpO2 99%   BMI 23.39 kg/m   She has mild prominence instead of the eyes especially right Exopthalmometry showing a measurement of 20 mm on the left and about 22 on the right No ecchymosis or redness No significant eyelid swelling No diplopia on lateral gaze with normal eye movements   The thyroid is is about 2 times normal on the right, smooth   No fine tremors are present. Deep tendon reflexes at biceps are slightly brisk.    Assessment/Plan:   Hyperthyroidism, secondary to Graves' disease   Although she had significant baseline symptoms and high free T4 level of 3.95 with taking methimazole since about 6 weeks ago her symptoms have  resolved and her free T4  is now low Also has a small goiter now Pulse rate is back to normal  She will now take only 7.5 mg of methimazole instead of 20 and leave it off for 3 days   Graves' ophthalmopathy: Since she is symptomatic with moderate symptoms but not a lot of objective findings we will treat her with a steroid Dosepak If not improved will consider referral to oculoplastic surgeon, she will call if not better next week   Elayne Snare 04/05/2020, 3:28 PM    Note: This office note was prepared with Dragon voice recognition system technology. Any transcriptional errors that result from this process are unintentional.

## 2020-04-07 ENCOUNTER — Ambulatory Visit: Payer: 59 | Admitting: Orthopaedic Surgery

## 2020-04-08 ENCOUNTER — Other Ambulatory Visit: Payer: Self-pay

## 2020-04-08 ENCOUNTER — Ambulatory Visit (INDEPENDENT_AMBULATORY_CARE_PROVIDER_SITE_OTHER): Payer: 59 | Admitting: *Deleted

## 2020-04-08 ENCOUNTER — Other Ambulatory Visit (HOSPITAL_COMMUNITY): Payer: 59

## 2020-04-08 ENCOUNTER — Ambulatory Visit (HOSPITAL_COMMUNITY): Payer: 59 | Attending: Internal Medicine

## 2020-04-08 VITALS — HR 89 | Ht 67.0 in | Wt 152.0 lb

## 2020-04-08 DIAGNOSIS — R002 Palpitations: Secondary | ICD-10-CM

## 2020-04-08 DIAGNOSIS — R0609 Other forms of dyspnea: Secondary | ICD-10-CM

## 2020-04-08 DIAGNOSIS — R0600 Dyspnea, unspecified: Secondary | ICD-10-CM | POA: Insufficient documentation

## 2020-04-08 LAB — ECHOCARDIOGRAM COMPLETE
AR max vel: 1.73 cm2
AV Area VTI: 1.8 cm2
AV Area mean vel: 1.88 cm2
AV Mean grad: 9.3 mmHg
AV Peak grad: 20 mmHg
Ao pk vel: 2.24 m/s
Area-P 1/2: 3.46 cm2
Height: 67 in
S' Lateral: 3 cm
Weight: 2432 oz

## 2020-04-08 NOTE — Patient Instructions (Addendum)
Medication Instructions:  Your physician recommends that you continue on your current medications as directed. Please refer to the Current Medication list given to you today.  *If you need a refill on your cardiac medications before your next appointment, please call your pharmacy*  Lab Work: None ordered.  If you have labs (blood work) drawn today and your tests are completely normal, you will receive your results only by: [name] MyChart Message (if you have MyChart) OR . A paper copy in the mail If you have any lab test that is abnormal or we need to change your treatment, we will call you to review the results.  Testing/Procedures: None ordered.  Follow-Up: At [facility], you and your health needs are our priority.  As part of our continuing mission to provide you with exceptional heart care, we have created designated Provider Care Teams.  These Care Teams include your primary Cardiologist (physician) and Advanced Practice Providers (APPs -  Physician Assistants and Nurse Practitioners) who all work together to provide you with the care you need, when you need it.  We recommend signing up for the patient portal called "MyChart".  Sign up information is provided on this After Visit Summary.  MyChart is used to connect with patients for Virtual Visits (Telemedicine).  Patients are able to view lab/test results, encounter notes, upcoming appointments, etc.  Non-urgent messages can be sent to your provider as well.   To learn more about what you can do with MyChart, go to [email or web address].    Your next appointment:   Your physician wants you to follow-up in: 07/19/20 at 1:40 pm.    Other Instructions:

## 2020-04-08 NOTE — Progress Notes (Signed)
1.) Reason for visit: EKG  2.) Name of MD requesting visit: Dr. Ena Dawley  3.) H&P: DOE and palpitations after covid vaccine  4.) ROS related to problem: Patient states her heart rate was elevated over 100 for about ~3 weeks after her booster shot. She was given atenolol 50 mg daily which she took just about 3 weeks.    5.) Assessment and plan per MD: DOD Dr. Acie Fredrickson reviewed EKG. NSR, no st/t abnormality. Will forward to [facility] for further advisement.

## 2020-04-18 ENCOUNTER — Other Ambulatory Visit: Payer: Self-pay

## 2020-04-18 ENCOUNTER — Ambulatory Visit
Admission: RE | Admit: 2020-04-18 | Discharge: 2020-04-18 | Disposition: A | Discharge status: Home / Self Care | Payer: 59 | Source: Ambulatory Visit | Attending: Neurology | Admitting: Neurology

## 2020-04-18 DIAGNOSIS — G35 Multiple sclerosis: Secondary | ICD-10-CM

## 2020-04-18 MED ORDER — GADOBENATE DIMEGLUMINE 529 MG/ML IV SOLN
15.0000 mL | Freq: Once | INTRAVENOUS | Status: AC | PRN
  Administered 2020-04-18: 15 mL via INTRAVENOUS

## 2020-04-21 ENCOUNTER — Other Ambulatory Visit: Payer: Self-pay | Admitting: Endocrinology

## 2020-04-21 DIAGNOSIS — E0500 Thyrotoxicosis with diffuse goiter without thyrotoxic crisis or storm: Secondary | ICD-10-CM

## 2020-04-26 MED FILL — methIMAzole 5 MG TABS: 5 | 30 days supply | Qty: 45 | Fill #0

## 2020-04-26 NOTE — Telephone Encounter (Signed)
Called patient and left her message about moving appointment up to 05/03/2020 - per provider no labs necessary.  Requested a call back.

## 2020-04-26 NOTE — Telephone Encounter (Signed)
-----   Message from Elayne Snare, MD sent at 04/22/2020 11:32 AM EST ----- Regarding: Urgent appointment I have sent her a my chart message to be seen on Tuesday without labs, please try to call her

## 2020-04-27 NOTE — Telephone Encounter (Signed)
Please call patient to find out if she still wants to be seen urgently for her eyes and can be scheduled as an acute patient this week or early next week without labs

## 2020-05-03 ENCOUNTER — Other Ambulatory Visit: Payer: Self-pay

## 2020-05-03 ENCOUNTER — Encounter: Payer: Self-pay | Admitting: Endocrinology

## 2020-05-03 ENCOUNTER — Ambulatory Visit: Payer: 59 | Admitting: Endocrinology

## 2020-05-03 DIAGNOSIS — E0500 Thyrotoxicosis with diffuse goiter without thyrotoxic crisis or storm: Secondary | ICD-10-CM | POA: Diagnosis not present

## 2020-05-03 DIAGNOSIS — E0590 Thyrotoxicosis, unspecified without thyrotoxic crisis or storm: Secondary | ICD-10-CM | POA: Diagnosis not present

## 2020-05-03 LAB — TSH: TSH: <0.01 u[IU]/mL — ABNORMAL LOW (ref 0.35–4.50)

## 2020-05-03 LAB — T4, FREE: Free T4: 1.31 ng/dL (ref 0.60–1.60)

## 2020-05-03 NOTE — Progress Notes (Signed)
Thyroid level is slightly high, to increase methimazole to 10 mg once a day instead of 7.5.

## 2020-05-03 NOTE — Progress Notes (Signed)
Patient ID: [name], female   DOB: 07-Dec-1960, 59 y.o.   MRN: [id number]                                                                                                               Reason for Appointment:  Hyperthyroidism, f/u consultation   Chief complaint: Fast heart rate   History of Present Illness:   Baseline history: 4 weeks prior to her initial visit she had symptoms of palpitations, some dizziness, feeling excessively warm and sweaty, shakiness of her legs, and weight loss The patient has had 15 about  lbs since these symptoms started Apparently she had gastroenteritis-like symptoms for about 5 days right after her Covid booster shot on 01/20/2020 also associated with significant malaise and weakness She was also feeling like her heart was racing and her breathing was difficult Despite her normal appetite she has lost weight  Recent history:  Initially for treatment she was started on methimazole 10 mg twice daily With this she had resolution of her symptoms of palpitations, breathing difficulty, feeling warm and shakiness  On her last visit in 11/21 even though she was not complaining of unusual fatigue she was hypothyroid with free T4 only 0.43 She was told to reduce her dose to 7.5 mg methimazole daily  Except for last week she does not complain of any palpitations or shakiness Last week she had some weakness, nausea and vomiting but this is improving now She has lost a little weight  Her main concern now is increased blurring of her vision with difficulty focusing sometimes and double vision either when driving or looking at her computer She also has some photophobia She was given a trial of Medrol Dosepak but she did not think this improved her vision much  Labs pending  Wt Readings from Last 3 Encounters:  05/03/20 147 lb 6.4 oz (66.9 kg)  04/08/20 152 lb (68.9 kg)  04/05/20 153 lb 12.8 oz (69.8 kg)    02/03/2020: Total T4 = 12.2  and total T3 321,  high  Lab Results  Component Value Date   FREET4 0.43 (L) 03/31/2020   FREET4 3.95 (H) 02/23/2020   T3FREE 2.9 03/31/2020   TSH <0.005 (L) 02/03/2020   TSH 1.220 10/29/2017    Lab Results  Component Value Date   THYROTRECAB 15.30 (H) 02/23/2020     Allergies as of 05/03/2020      Reactions   Methylprednisolone Hives   Solu-medrol [methylprednisolone Sodium Succ] Hives, Itching   Cephalexin Hives, Itching      Medication List       Accurate as of May 03, 2020  2:00 PM. If you have any questions, ask your nurse or doctor.        FLUoxetine 20 MG capsule Commonly known as: PROZAC TAKE 3 CAPSULES (60 MG TOTAL) BY MOUTH DAILY   lisdexamfetamine 60 MG capsule Commonly known as: VYVANSE Take 1 capsule (60 mg total) by mouth every morning.   methimazole 5 MG tablet Commonly known as: TAPAZOLE Take  1.5 tablets (7.5 mg total) by mouth daily.   pantoprazole 40 MG tablet Commonly known as: PROTONIX Take 40 mg by mouth daily.   Pfizer-BioNTech COVID-19 Vacc 30 MCG/0.3ML injection Generic drug: COVID-19 mRNA vaccine (Pfizer)   predniSONE 10 MG (21) Tbpk tablet Commonly known as: STERAPRED UNI-PAK 21 TAB Take as directed   VUMERITY PO Take by mouth.   Vumerity 231 MG Cpdr Generic drug: Diroximel Fumarate Take 462 mg by mouth 2 (two) times daily.           Past Medical History:  Diagnosis Date  . ADD (attention deficit disorder with hyperactivity)   . Anxiety   . Arthritis   . Cancer ([place])    Skin cancer- basal cell, squamous cell  . Cervical dysplasia   . GERD (gastroesophageal reflux disease)   . Headache    migraine  . Hepatitis    Pt states Core and surface antibioties for Hepatitis whens he was 50  . Mitral valve prolapse   . Multiple sclerosis ([place])   . Multiple sclerosis ([place])   . Tendonitis in rigtht wrist     Past Surgical History:  Procedure Laterality Date  . ABDOMINAL HYSTERECTOMY  2004   ovaries retained  . BACK SURGERY    .  heart ablation     15 yrs ago for a fib by Dr.Klein, everything ok  . KNEE ARTHROSCOPY WITH SUBCHONDROPLASTY Left 11/13/2017   Procedure: LEFT KNEE ARTHROSCOPY WITH SUBCHONDROPLASTY, PARTIAL MEDIAL MENISCECTOMY, SYNOVECTOMY;  Surgeon: Leandrew Koyanagi, MD;  Location: [place];  Service: Orthopedics;  Laterality: Left;  . TUBAL LIGATION      Family History  Problem Relation Age of Onset  . Depression Mother   . Stroke Mother   . Heart disease Mother   . Dementia Mother   . Hypothyroidism Mother   . Cancer Neg Hx   . Diabetes Neg Hx     Social History:  reports that she has been smoking cigarettes. She has been smoking about 0.25 packs per day. She has never used smokeless tobacco. She reports current alcohol use. She reports that she does not use drugs.  Allergies:  Allergies  Allergen Reactions  . Methylprednisolone Hives  . Solu-Medrol [Methylprednisolone Sodium Succ] Hives and Itching  . Cephalexin Hives and Itching     Review of Systems     Examination:   BP 122/70   Pulse (!) 101   Ht 5\' 7"  (1.702 m)   Wt 147 lb 6.4 oz (66.9 kg)   SpO2 96%   BMI 23.09 kg/m   She has proptosis of the right side with mild lid retraction Exopthalmometry showing a measurement of 20 mm on the left and about 23 on the right No ecchymosis or redness No significant eyelid swelling He appears to have mild diplopia on looking up and to the right but her eye movements appear to be fairly full   The thyroid is is about 1.5-2 times normal on the right, smooth   No fine tremors are present. Deep tendon reflexes at biceps are normal    Assessment/Plan:   Hyperthyroidism, secondary to Graves' disease   She is on methimazole and now taking 7.5 mg daily since about a month ago Previously had markedly increased baseline thyroid levels and became hypothyroid with 20 mg daily Although her pulse is fast she does not appear overtly hyperthyroid but has lost weight recently  We  will check her thyroid levels today and decide on her further  treatment  Episode of vomiting and weakness: Not clear of the etiology, she does need to have a PCP for general care   Graves' ophthalmopathy: Since she is significantly symptomatic with diplopia and also proptosis on the right eye not responding to steroid Dosepak will plan to give her Tepezza infusions Will contact short stay center to see if they will do the infusion otherwise she will go to [facility] infusion center Discussed how the infusion is done, possible side effects For her weight she will likely need 700 mg for the first infusion over 90 minutes and then 1400 mg every 3 weeks Prior authorization will likely need to be done  She is reluctant to take a high-dose IV Solu-Medrol since she thinks she is allergic to this She will also keep her appointment with the oculoplastic surgeon next month   Jaevon Paras 05/03/2020, 2:00 PM    Note: This office note was prepared with Dragon voice recognition system technology. Any transcriptional errors that result from this process are unintentional.

## 2020-05-04 ENCOUNTER — Telehealth: Payer: Self-pay

## 2020-05-04 ENCOUNTER — Other Ambulatory Visit: Payer: 59

## 2020-05-04 ENCOUNTER — Other Ambulatory Visit: Payer: Self-pay | Admitting: Endocrinology

## 2020-05-04 LAB — THYROTROPIN RECEPTOR AUTOABS: Thyrotropin Receptor Ab: 33.3 IU/L — ABNORMAL HIGH (ref 0.00–1.75)

## 2020-05-04 MED ORDER — METHIMAZOLE 10 MG PO TABS: 10.0000 mg | ORAL_TABLET | Freq: Three times a day (TID) | ORAL | 0 refills | Status: DC

## 2020-05-04 MED FILL — methIMAzole 10 MG TABS: 10 | 30 days supply | Qty: 90 | Fill #0

## 2020-05-04 NOTE — Telephone Encounter (Signed)
Left detailed message informing patient of results and medication change.

## 2020-05-04 NOTE — Telephone Encounter (Signed)
-----   Message from Elayne Snare, MD sent at 05/03/2020  9:32 PM EST ----- Thyroid level is slightly high, to increase methimazole to 10 mg once a day instead of 7.5.

## 2020-05-09 ENCOUNTER — Ambulatory Visit: Payer: 59 | Admitting: Endocrinology

## 2020-05-11 ENCOUNTER — Telehealth: Payer: Self-pay | Admitting: Endocrinology

## 2020-05-11 ENCOUNTER — Encounter: Payer: Self-pay | Admitting: Endocrinology

## 2020-05-11 MED ORDER — METHIMAZOLE 10 MG PO TABS: 10.0000 mg | ORAL_TABLET | Freq: Every day | ORAL | 0 refills | Status: DC

## 2020-05-11 NOTE — Telephone Encounter (Signed)
Spoken to patient. Apologized for the error. It was sent in error for 3 times a day. I have corrected in her current medication list. Patient stated that she already pick the bottle and will only take 1 tablet (10 mg) once a day.

## 2020-05-11 NOTE — Telephone Encounter (Signed)
Patient requests to be called at ph# [phone] for clarification of dosage of Methimazole. Patient picked up RX and the dosage states take 3 10 mg pills per day. Patient is concerned about such a high dosage since previous dosage was 1 7.5 mg pill per day.

## 2020-05-12 DIAGNOSIS — Z0279 Encounter for issue of other medical certificate: Secondary | ICD-10-CM

## 2020-05-17 ENCOUNTER — Other Ambulatory Visit (HOSPITAL_COMMUNITY): Payer: Self-pay | Admitting: Ophthalmology

## 2020-05-17 ENCOUNTER — Other Ambulatory Visit: Payer: Self-pay | Admitting: Ophthalmology

## 2020-05-17 ENCOUNTER — Telehealth: Payer: Self-pay | Admitting: Emergency Medicine

## 2020-05-17 DIAGNOSIS — H532 Diplopia: Secondary | ICD-10-CM | POA: Diagnosis not present

## 2020-05-17 DIAGNOSIS — H02531 Eyelid retraction right upper eyelid: Secondary | ICD-10-CM | POA: Diagnosis not present

## 2020-05-17 DIAGNOSIS — H5702 Anisocoria: Secondary | ICD-10-CM | POA: Diagnosis not present

## 2020-05-17 DIAGNOSIS — H02534 Eyelid retraction left upper eyelid: Secondary | ICD-10-CM | POA: Diagnosis not present

## 2020-05-17 DIAGNOSIS — H05243 Constant exophthalmos, bilateral: Secondary | ICD-10-CM | POA: Diagnosis not present

## 2020-05-17 DIAGNOSIS — H16213 Exposure keratoconjunctivitis, bilateral: Secondary | ICD-10-CM | POA: Diagnosis not present

## 2020-05-17 DIAGNOSIS — H16141 Punctate keratitis, right eye: Secondary | ICD-10-CM | POA: Diagnosis not present

## 2020-05-17 DIAGNOSIS — E0500 Thyrotoxicosis with diffuse goiter without thyrotoxic crisis or storm: Secondary | ICD-10-CM | POA: Diagnosis not present

## 2020-05-17 DIAGNOSIS — H02211 Cicatricial lagophthalmos right upper eyelid: Secondary | ICD-10-CM | POA: Diagnosis not present

## 2020-05-17 DIAGNOSIS — H02423 Myogenic ptosis of bilateral eyelids: Secondary | ICD-10-CM | POA: Diagnosis not present

## 2020-05-17 DIAGNOSIS — H16211 Exposure keratoconjunctivitis, right eye: Secondary | ICD-10-CM | POA: Diagnosis not present

## 2020-05-17 MED FILL — ERYTHROMYCIN EYE OINTMENT: 5 | 30 days supply | Qty: 4 | Fill #0

## 2020-05-17 MED FILL — predniSONE 10 MG TABS: 10 | 25 days supply | Qty: 68 | Fill #0

## 2020-05-17 NOTE — Telephone Encounter (Signed)
Dr. Westley Foots called and stated patient was having increased problems with her Graves Disease/Thyroid Eye Disease and asked if he could give an increased dosing of steroids with tapering, 60 mg x 3 days, then 50 mg x 3 days and so on.  I discussed with Dr. Pearlean Brownie and he said yes, that is fine, steroids is given to MS patients all the time.  Informed Dr. Westley Foots and he expressed appreciation for the information.

## 2020-05-18 ENCOUNTER — Ambulatory Visit (HOSPITAL_COMMUNITY)
Admission: RE | Admit: 2020-05-18 | Discharge: 2020-05-18 | Disposition: A | Discharge status: Home / Self Care | Payer: 59 | Source: Ambulatory Visit | Attending: Ophthalmology | Admitting: Ophthalmology

## 2020-05-18 ENCOUNTER — Other Ambulatory Visit: Payer: Self-pay

## 2020-05-18 DIAGNOSIS — E0500 Thyrotoxicosis with diffuse goiter without thyrotoxic crisis or storm: Secondary | ICD-10-CM | POA: Diagnosis not present

## 2020-05-18 DIAGNOSIS — H05243 Constant exophthalmos, bilateral: Secondary | ICD-10-CM | POA: Insufficient documentation

## 2020-05-18 DIAGNOSIS — M6289 Other specified disorders of muscle: Secondary | ICD-10-CM | POA: Diagnosis not present

## 2020-05-27 ENCOUNTER — Other Ambulatory Visit: Payer: Self-pay

## 2020-05-27 ENCOUNTER — Other Ambulatory Visit (INDEPENDENT_AMBULATORY_CARE_PROVIDER_SITE_OTHER): Payer: 59

## 2020-05-27 ENCOUNTER — Other Ambulatory Visit: Payer: Self-pay | Admitting: Neurology

## 2020-05-27 DIAGNOSIS — E0590 Thyrotoxicosis, unspecified without thyrotoxic crisis or storm: Secondary | ICD-10-CM

## 2020-05-27 LAB — T4, FREE: Free T4: 1.49 ng/dL (ref 0.60–1.60)

## 2020-05-27 LAB — TSH: TSH: <0.01 u[IU]/mL — ABNORMAL LOW (ref 0.35–4.50)

## 2020-05-27 MED ORDER — FLUOXETINE HCL 20 MG PO CAPS: ORAL_CAPSULE | ORAL | 3 refills | Status: DC

## 2020-05-27 MED FILL — FLUoxetine HCL 20 MG CAPS: 20 | 90 days supply | Qty: 270 | Fill #0

## 2020-06-01 ENCOUNTER — Other Ambulatory Visit: Payer: Self-pay

## 2020-06-01 ENCOUNTER — Other Ambulatory Visit: Payer: Self-pay | Admitting: Endocrinology

## 2020-06-01 ENCOUNTER — Encounter: Payer: Self-pay | Admitting: Endocrinology

## 2020-06-01 ENCOUNTER — Ambulatory Visit: Payer: 59 | Admitting: Endocrinology

## 2020-06-01 VITALS — BP 116/66 | HR 93 | Temp 98.7°F | Ht 67.0 in | Wt 146.2 lb

## 2020-06-01 DIAGNOSIS — E0590 Thyrotoxicosis, unspecified without thyrotoxic crisis or storm: Secondary | ICD-10-CM | POA: Diagnosis not present

## 2020-06-01 MED ORDER — METHIMAZOLE 10 MG PO TABS: ORAL_TABLET | ORAL | 1 refills | Status: DC

## 2020-06-01 MED FILL — methIMAzole 10 MG TABS: 10 | 90 days supply | Qty: 135 | Fill #0

## 2020-06-01 NOTE — Progress Notes (Signed)
Patient ID: [name], female   DOB: 1960/06/16, 60 y.o.   MRN: [id number]                                                                                                               Reason for Appointment:  Hyperthyroidism, follow-up visit   Chief complaint: Follow-up   History of Present Illness:   Baseline history: 4 weeks prior to her initial visit she had symptoms of palpitations, dizziness, feeling excessively warm/sweaty, shakiness of her legs, and weight loss The patient had last 15 about  lbs since these symptoms started Apparently she had gastroenteritis-like symptoms for about 5 days right after her Covid booster shot on 01/20/2020 also associated with significant malaise and weakness She was also feeling like her heart was racing and her breathing was difficult Despite her normal appetite she has lost weight  Recent history:  Initially for treatment she was started on methimazole 10 mg twice daily With this she had resolution of her symptoms of palpitations, breathing difficulty, feeling warm and shakiness  On her visit in 11/21 even though she was not complaining of unusual fatigue she was hypothyroid with free T4 only 0.43 She was told to reduce her dose to 7.5 mg methimazole daily This was subsequently increased to 10 mg  With increasing her dose in 12/21 she has had less frequent palpitations and shakiness but still happening at times Weight is about the same Does not feel excessively hot  Ophthalmopathy: She still has blurring of her vision with difficulty focusing and double vision which is now persistent She has less discomfort with starting 60 mg of prednisone from the ophthalmologist recently  She was just approved for Tepezza infusions and is waiting to be scheduled  Labs showing free T4 to be still relatively high at 1.5 with suppressed TSH  Wt Readings from Last 3 Encounters:  06/01/20 146 lb 3.2 oz (66.3 kg)  05/03/20 147 lb 6.4 oz (66.9 kg)   04/08/20 152 lb (68.9 kg)    02/03/2020: Total T4 = 12.2  and total T3 321, high  Lab Results  Component Value Date   FREET4 1.49 05/27/2020   FREET4 1.31 05/03/2020   FREET4 0.43 (L) 03/31/2020   T3FREE 2.9 03/31/2020   TSH <0.01 (L) 05/27/2020   TSH <0.01 Repeated and verified X2. (L) 05/03/2020   TSH <0.005 (L) 02/03/2020    Lab Results  Component Value Date   THYROTRECAB 33.30 (H) 05/03/2020   THYROTRECAB 15.30 (H) 02/23/2020     Allergies as of 06/01/2020      Reactions   Methylprednisolone Hives   Solu-medrol [methylprednisolone Sodium Succ] Hives, Itching   Cephalexin Hives, Itching      Medication List       Accurate as of June 01, 2020  1:28 PM. If you have any questions, ask your nurse or doctor.        FLUoxetine 20 MG capsule Commonly known as: PROZAC TAKE 3 CAPSULES (60 MG TOTAL) BY MOUTH DAILY   lisdexamfetamine  60 MG capsule Commonly known as: VYVANSE Take 1 capsule (60 mg total) by mouth every morning.   methimazole 10 MG tablet Commonly known as: TAPAZOLE Take 1 tablet (10 mg total) by mouth daily.   pantoprazole 40 MG tablet Commonly known as: PROTONIX Take 40 mg by mouth daily.   Pfizer-BioNTech COVID-19 Vacc 30 MCG/0.3ML injection Generic drug: COVID-19 mRNA vaccine (Pfizer)   predniSONE 10 MG (21) Tbpk tablet Commonly known as: STERAPRED UNI-PAK 21 TAB Take as directed   VUMERITY PO Take by mouth.   Vumerity 231 MG Cpdr Generic drug: Diroximel Fumarate Take 462 mg by mouth 2 (two) times daily.           Past Medical History:  Diagnosis Date  . ADD (attention deficit disorder with hyperactivity)   . Anxiety   . Arthritis   . Cancer ([place])    Skin cancer- basal cell, squamous cell  . Cervical dysplasia   . GERD (gastroesophageal reflux disease)   . Headache    migraine  . Hepatitis    Pt states Core and surface antibioties for Hepatitis whens he was 15  . Mitral valve prolapse   . Multiple sclerosis ([place])   .  Multiple sclerosis ([place])   . Tendonitis in rigtht wrist     Past Surgical History:  Procedure Laterality Date  . ABDOMINAL HYSTERECTOMY  2004   ovaries retained  . BACK SURGERY    . heart ablation     15 yrs ago for a fib by Dr.Klein, everything ok  . KNEE ARTHROSCOPY WITH SUBCHONDROPLASTY Left 11/13/2017   Procedure: LEFT KNEE ARTHROSCOPY WITH SUBCHONDROPLASTY, PARTIAL MEDIAL MENISCECTOMY, SYNOVECTOMY;  Surgeon: Leandrew Koyanagi, MD;  Location: [place];  Service: Orthopedics;  Laterality: Left;  . TUBAL LIGATION      Family History  Problem Relation Age of Onset  . Depression Mother   . Stroke Mother   . Heart disease Mother   . Dementia Mother   . Hypothyroidism Mother   . Cancer Neg Hx   . Diabetes Neg Hx     Social History:  reports that she has been smoking cigarettes. She has been smoking about 0.25 packs per day. She has never used smokeless tobacco. She reports current alcohol use. She reports that she does not use drugs.  Allergies:  Allergies  Allergen Reactions  . Methylprednisolone Hives  . Solu-Medrol [Methylprednisolone Sodium Succ] Hives and Itching  . Cephalexin Hives and Itching     Review of Systems     Examination:   BP 116/66   Pulse 93   Temp 98.7 F (37.1 C)   Ht 5\' 7"  (1.702 m)   Wt 146 lb 3.2 oz (66.3 kg)   SpO2 98%   BMI 22.90 kg/m   She has proptosis of the right side with mild lid retraction Has mild stare also   The thyroid is is about twice normal, slightly more on the right side, slightly firm  No fine tremors are present. Deep tendon reflexes at biceps are slightly brisk    Assessment/Plan:   Hyperthyroidism, secondary to Graves' disease   She is on methimazole and now taking 10 mg daily since her last visit However even with increasing her dose slightly her free T4 is slightly higher as well as TSH suppressed as before  Her thyrotropin receptor antibody had increased in December also Since she is  somewhat symptomatic still and has mild tachycardia will increase her dose to 15 mg a day  using extra 5 mg in the evening   Graves' ophthalmopathy: She is not having any relief again with steroids and is going to be treated with Tepezza by her oculoplastic surgeon    Elayne Snare 06/01/2020, 1:28 PM    Note: This office note was prepared with Dragon voice recognition system technology. Any transcriptional errors that result from this process are unintentional.

## 2020-06-06 ENCOUNTER — Telehealth (HOSPITAL_COMMUNITY): Payer: 59 | Admitting: Psychiatry

## 2020-06-07 ENCOUNTER — Other Ambulatory Visit: Payer: Self-pay | Admitting: Ophthalmology

## 2020-06-07 ENCOUNTER — Other Ambulatory Visit (HOSPITAL_COMMUNITY): Payer: Self-pay | Admitting: Ophthalmology

## 2020-06-07 DIAGNOSIS — E0500 Thyrotoxicosis with diffuse goiter without thyrotoxic crisis or storm: Secondary | ICD-10-CM | POA: Diagnosis not present

## 2020-06-07 DIAGNOSIS — H02534 Eyelid retraction left upper eyelid: Secondary | ICD-10-CM | POA: Diagnosis not present

## 2020-06-07 DIAGNOSIS — H02531 Eyelid retraction right upper eyelid: Secondary | ICD-10-CM | POA: Diagnosis not present

## 2020-06-07 DIAGNOSIS — H16213 Exposure keratoconjunctivitis, bilateral: Secondary | ICD-10-CM | POA: Diagnosis not present

## 2020-06-07 DIAGNOSIS — H05243 Constant exophthalmos, bilateral: Secondary | ICD-10-CM | POA: Diagnosis not present

## 2020-06-07 DIAGNOSIS — H5702 Anisocoria: Secondary | ICD-10-CM | POA: Diagnosis not present

## 2020-06-07 DIAGNOSIS — Z09 Encounter for follow-up examination after completed treatment for conditions other than malignant neoplasm: Secondary | ICD-10-CM | POA: Diagnosis not present

## 2020-06-07 DIAGNOSIS — H02211 Cicatricial lagophthalmos right upper eyelid: Secondary | ICD-10-CM | POA: Diagnosis not present

## 2020-06-07 DIAGNOSIS — H532 Diplopia: Secondary | ICD-10-CM | POA: Diagnosis not present

## 2020-06-07 MED FILL — SULFAMETHOXAZOLE-TMP DS TAB: 800-160 | 28 days supply | Qty: 12 | Fill #0

## 2020-06-07 MED FILL — predniSONE 10 MG TABS: 10 | 42 days supply | Qty: 116 | Fill #0

## 2020-06-08 DIAGNOSIS — H02531 Eyelid retraction right upper eyelid: Secondary | ICD-10-CM | POA: Diagnosis not present

## 2020-06-08 DIAGNOSIS — H02534 Eyelid retraction left upper eyelid: Secondary | ICD-10-CM | POA: Diagnosis not present

## 2020-06-08 DIAGNOSIS — H16213 Exposure keratoconjunctivitis, bilateral: Secondary | ICD-10-CM | POA: Diagnosis not present

## 2020-06-08 DIAGNOSIS — H05243 Constant exophthalmos, bilateral: Secondary | ICD-10-CM | POA: Diagnosis not present

## 2020-06-08 DIAGNOSIS — H532 Diplopia: Secondary | ICD-10-CM | POA: Diagnosis not present

## 2020-06-08 DIAGNOSIS — H5702 Anisocoria: Secondary | ICD-10-CM | POA: Diagnosis not present

## 2020-06-08 DIAGNOSIS — H02211 Cicatricial lagophthalmos right upper eyelid: Secondary | ICD-10-CM | POA: Diagnosis not present

## 2020-06-08 DIAGNOSIS — E0500 Thyrotoxicosis with diffuse goiter without thyrotoxic crisis or storm: Secondary | ICD-10-CM | POA: Diagnosis not present

## 2020-06-09 ENCOUNTER — Telehealth: Payer: Self-pay | Admitting: *Deleted

## 2020-06-09 NOTE — Telephone Encounter (Signed)
Received fax from [facility] that [place] approved 06/09/20-06/08/21 for max of 12 refills for max daily dose of 4. PA ref# [id number].

## 2020-06-10 ENCOUNTER — Other Ambulatory Visit (HOSPITAL_COMMUNITY): Payer: Self-pay | Admitting: Psychiatry

## 2020-06-10 ENCOUNTER — Other Ambulatory Visit: Payer: Self-pay

## 2020-06-10 ENCOUNTER — Encounter (HOSPITAL_COMMUNITY): Payer: Self-pay | Admitting: Psychiatry

## 2020-06-10 ENCOUNTER — Telehealth (INDEPENDENT_AMBULATORY_CARE_PROVIDER_SITE_OTHER): Payer: 59 | Admitting: Psychiatry

## 2020-06-10 DIAGNOSIS — F900 Attention-deficit hyperactivity disorder, predominantly inattentive type: Secondary | ICD-10-CM | POA: Diagnosis not present

## 2020-06-10 MED ORDER — LISDEXAMFETAMINE DIMESYLATE 60 MG PO CAPS: 60.0000 mg | ORAL_CAPSULE | ORAL | 0 refills | Status: DC

## 2020-06-10 MED FILL — VYVANSE 60 MG CAPSULE: 60 | 90 days supply | Qty: 90 | Fill #0

## 2020-06-10 NOTE — Progress Notes (Signed)
Virtual Visit via Telephone Note  I connected with [name] on 06/10/20 at  9:00 AM EST by telephone and verified that I am speaking with the correct person using two identifiers.  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 I also discussed with the patient that there may be a patient responsible charge related to this service. The patient expressed understanding and agreed to proceed.   History of Present Illness: Patient is evaluated by phone session.  She is at work.  She reported anxiety from her general health.  She was diagnosed with hyperthyroidism and recently her physician changing and adjusting the dose of medication.  She also had procedure to prevent her cornea scratches as she has difficulty closing her eyes because of dryness.  She feels her job is going well.  Her Christmas was quite.  She is able to focus and do multitasking.  She denies any crying spells or any feeling of hopelessness or worthlessness.  She is getting Prozac from her neurologist which is helping her depression.  She has no tremors shakes or any EPS.  She had lost a lot of weight in past 6 months but hoping it will get stabilized once her thyroid level under control.  She admitted some time insomnia because of bladder issues but it is not worsening from the past.  She had blood work and I reviewed the results.  Her liver function tests are high.  Her alkaline phosphatase, ALT and AST are increased in past few months.  Patient was discussed about results and she believes changing thyroid medications may have trigger since it was not high before that.  We talked about Vyvanse dose but patient like to keep the current dose because it is helping her functioning.  However she agreed to contact her endocrinologist to discuss her liver enzymes.   She denies any tremors or shakes or any EPS.   Past Psychiatric History: H/Odepression and ADD. Tried Zoloft,Focalin, Ritalin and Strattera. No history of inpatient or any suicidal attempt.  Recent Results (from the past 2160 hour(s))  ALT     Status: Abnormal   Collection Time: 03/31/20  2:22 PM  Result Value Ref Range   ALT 76 (H) 0 - 35 U/L  CBC     Status: Abnormal   Collection Time: 03/31/20  2:22 PM  Result Value Ref Range   WBC 7.8 4.0 - 10.5 K/uL   RBC 4.38 3.87 - 5.11 Mil/uL   Platelets 305.0 150.0 - 400.0 K/uL   Hemoglobin 11.8 (L) 12.0 - 15.0 g/dL   HCT 36.4 36.0 - 46.0 %   MCV 83.1 78.0 - 100.0 fl   MCHC 32.5 30.0 - 36.0 g/dL   RDW 15.0 11.5 - 15.5 %  T3, free     Status: None   Collection Time: 03/31/20  2:22 PM  Result Value Ref Range   T3, Free 2.9 2.3 - 4.2 pg/mL  T4, free     Status: Abnormal   Collection Time: 03/31/20  2:22 PM  Result Value Ref Range   Free T4 0.43 (L) 0.60 - 1.60 ng/dL    Comment: Specimens from patients who are undergoing biotin therapy and /or ingesting biotin supplements may contain high levels of biotin.  The higher biotin concentration in these specimens interferes with this Free T4 assay.  Specimens that contain  high levels  of biotin may cause false high results for this Free T4 assay.  Please interpret results in light of the total clinical presentation of the patient.    Comprehensive metabolic panel     Status: Abnormal   Collection Time: 03/31/20  2:22 PM  Result Value Ref Range   Sodium 138 135 - 145 mEq/L   Potassium 4.0 3.5 - 5.1 mEq/L   Chloride 101 96 - 112 mEq/L   CO2 31 19 - 32 mEq/L   Glucose, Bld 95 70 - 99 mg/dL   BUN 21 6 - 23 mg/dL   Creatinine, Ser 0.51 0.40 - 1.20 mg/dL   Total Bilirubin 0.6 0.2 - 1.2 mg/dL   Alkaline Phosphatase 397 (H) 39 - 117 U/L   AST 49 (H) 0 - 37 U/L   ALT 76 (H) 0 - 35 U/L   Total Protein 6.5 6.0 - 8.3 g/dL   Albumin 3.9 3.5 - 5.2 g/dL   GFR 102.24 >60.00 mL/min    Comment:  Calculated using the CKD-EPI Creatinine Equation (2021)   Calcium 9.1 8.4 - 10.5 mg/dL  ECHOCARDIOGRAM COMPLETE     Status: None   Collection Time: 04/08/20  3:20 PM  Result Value Ref Range   Weight 2,432 oz   Height 67 in   Area-P 1/2 3.46 cm2   S' Lateral 3.00 cm   AV Area mean vel 1.88 cm2   AR max vel 1.73 cm2   AV Area VTI 1.80 cm2   Ao pk vel 2.24 m/s   AV Mean grad 9.3 mmHg   AV Peak grad 20.0 mmHg  Thyrotropin receptor autoabs     Status: Abnormal   Collection Time: 05/03/20  2:22 PM  Result Value Ref Range   Thyrotropin Receptor Ab 33.30 (H) 0.00 - 1.75 IU/L  TSH     Status: Abnormal   Collection Time: 05/03/20  2:22 PM  Result Value Ref Range   TSH <0.01 Repeated and verified X2. (L) 0.35 - 4.50 uIU/mL  T4, free     Status: None   Collection Time: 05/03/20  2:22 PM  Result Value Ref Range   Free T4 1.31 0.60 - 1.60 ng/dL    Comment: Specimens from patients who are undergoing biotin therapy and /or ingesting biotin supplements may contain high levels of biotin.  The higher biotin concentration in these specimens interferes with this Free T4 assay.  Specimens that contain high levels  of biotin may cause false high results for this Free T4 assay.  Please interpret results in light of the total clinical presentation of the patient.    TSH     Status: Abnormal   Collection Time: 05/27/20  3:08 PM  Result Value Ref Range   TSH <0.01 (L) 0.35 - 4.50 uIU/mL  T4, free     Status: None   Collection Time: 05/27/20  3:08 PM  Result Value Ref Range   Free T4 1.49 0.60 - 1.60 ng/dL    Comment: Specimens from patients who are undergoing biotin therapy and /or ingesting biotin supplements may contain high levels of biotin.  The higher biotin concentration in these specimens interferes with this Free T4 assay.  Specimens that contain high levels  of biotin may cause false high results for this Free T4 assay.  Please interpret results in light of the total clinical presentation of the  patient.        Psychiatric Specialty Exam: Physical Exam  Review of Systems  Weight  146 lb (66.2 kg).There is no height or weight on file to calculate BMI.  General Appearance: NA  Eye Contact:  NA  Speech:  Clear and Coherent  Volume:  Normal  Mood:  Euthymic  Affect:  NA  Thought Process:  Goal Directed  Orientation:  Full (Time, Place, and Person)  Thought Content:  Logical  Suicidal Thoughts:  No  Homicidal Thoughts:  No  Memory:  Immediate;   Good Recent;   Good Remote;   Good  Judgement:  Intact  Insight:  Present  Psychomotor Activity:  NA  Concentration:  Concentration: Good and Attention Span: Good  Recall:  Good  Fund of Knowledge:  Good  Language:  Good  Akathisia:  No  Handed:  Right  AIMS (if indicated):     Assets:  Communication Skills Desire for Improvement Housing Resilience Social Support Talents/Skills Transportation  ADL's:  Intact  Cognition:  WNL  Sleep:   fair      Assessment and Plan: Attention deficit disorder, inattentive type.  Major depressive disorder, recurrent.  Discussed blood work results and high liver enzymes.  Her Vyvanse dose is same and her Prozac dose is also unchanged, while.  We discussed possibility of high liver enzymes due to recent medication changes from endocrinologist to address thyroid.  I recommend that she should contact her endocrinologist to discuss the blood results as patient is going to have infusion next week.  She agreed with the plan.  We will keep the current dose of Vyvanse 60 mg as it is unlikely Vyvanse causing high liver enzymes.  However we discussed with high liver enzymes we may need to reduce Vyvanse in the future.  Patient understand and acknowledge.  Recommended to call us back if is any question or any concern.  Follow-up in 3 months.  Follow Up Instructions:    I discussed the assessment and treatment plan with the patient. The patient was provided an opportunity to ask questions and all  were answered. The patient agreed with the plan and demonstrated an understanding of the instructions.   The patient was advised to call back or seek an in-person evaluation if the symptoms worsen or if the condition fails to improve as anticipated.  I provided 18 minutes of non-face-to-face time during this encounter.   Kathlee Nations, MD

## 2020-06-14 DIAGNOSIS — E0590 Thyrotoxicosis, unspecified without thyrotoxic crisis or storm: Secondary | ICD-10-CM | POA: Diagnosis not present

## 2020-06-14 DIAGNOSIS — E0500 Thyrotoxicosis with diffuse goiter without thyrotoxic crisis or storm: Secondary | ICD-10-CM | POA: Diagnosis not present

## 2020-06-16 DIAGNOSIS — E0500 Thyrotoxicosis with diffuse goiter without thyrotoxic crisis or storm: Secondary | ICD-10-CM | POA: Diagnosis not present

## 2020-06-16 DIAGNOSIS — H02531 Eyelid retraction right upper eyelid: Secondary | ICD-10-CM | POA: Diagnosis not present

## 2020-06-16 DIAGNOSIS — H532 Diplopia: Secondary | ICD-10-CM | POA: Diagnosis not present

## 2020-06-16 DIAGNOSIS — H16213 Exposure keratoconjunctivitis, bilateral: Secondary | ICD-10-CM | POA: Diagnosis not present

## 2020-06-16 DIAGNOSIS — Z09 Encounter for follow-up examination after completed treatment for conditions other than malignant neoplasm: Secondary | ICD-10-CM | POA: Diagnosis not present

## 2020-06-16 DIAGNOSIS — H02211 Cicatricial lagophthalmos right upper eyelid: Secondary | ICD-10-CM | POA: Diagnosis not present

## 2020-06-16 DIAGNOSIS — H5702 Anisocoria: Secondary | ICD-10-CM | POA: Diagnosis not present

## 2020-06-16 DIAGNOSIS — H05243 Constant exophthalmos, bilateral: Secondary | ICD-10-CM | POA: Diagnosis not present

## 2020-06-16 DIAGNOSIS — H02534 Eyelid retraction left upper eyelid: Secondary | ICD-10-CM | POA: Diagnosis not present

## 2020-06-29 ENCOUNTER — Other Ambulatory Visit (HOSPITAL_COMMUNITY): Payer: Self-pay | Admitting: Ophthalmology

## 2020-06-29 DIAGNOSIS — H02211 Cicatricial lagophthalmos right upper eyelid: Secondary | ICD-10-CM | POA: Diagnosis not present

## 2020-06-29 DIAGNOSIS — H532 Diplopia: Secondary | ICD-10-CM | POA: Diagnosis not present

## 2020-06-29 DIAGNOSIS — E0500 Thyrotoxicosis with diffuse goiter without thyrotoxic crisis or storm: Secondary | ICD-10-CM | POA: Diagnosis not present

## 2020-06-29 DIAGNOSIS — H02534 Eyelid retraction left upper eyelid: Secondary | ICD-10-CM | POA: Diagnosis not present

## 2020-06-29 DIAGNOSIS — Z09 Encounter for follow-up examination after completed treatment for conditions other than malignant neoplasm: Secondary | ICD-10-CM | POA: Diagnosis not present

## 2020-06-29 DIAGNOSIS — H02531 Eyelid retraction right upper eyelid: Secondary | ICD-10-CM | POA: Diagnosis not present

## 2020-06-29 DIAGNOSIS — H16213 Exposure keratoconjunctivitis, bilateral: Secondary | ICD-10-CM | POA: Diagnosis not present

## 2020-06-29 DIAGNOSIS — H05243 Constant exophthalmos, bilateral: Secondary | ICD-10-CM | POA: Diagnosis not present

## 2020-06-29 DIAGNOSIS — H5702 Anisocoria: Secondary | ICD-10-CM | POA: Diagnosis not present

## 2020-06-29 MED FILL — LORAZEPAM 1 MG TABS: 1 | 1 days supply | Qty: 1 | Fill #0

## 2020-06-29 MED FILL — ERYTHROMYCIN EYE OINTMENT: 5 | 10 days supply | Qty: 4 | Fill #0

## 2020-06-29 MED FILL — diazePAM 5 MG TABS: 5 | 1 days supply | Qty: 1 | Fill #0

## 2020-07-04 DIAGNOSIS — H02531 Eyelid retraction right upper eyelid: Secondary | ICD-10-CM | POA: Diagnosis not present

## 2020-07-04 DIAGNOSIS — H16211 Exposure keratoconjunctivitis, right eye: Secondary | ICD-10-CM | POA: Diagnosis not present

## 2020-07-04 DIAGNOSIS — H02211 Cicatricial lagophthalmos right upper eyelid: Secondary | ICD-10-CM | POA: Diagnosis not present

## 2020-07-05 ENCOUNTER — Other Ambulatory Visit (INDEPENDENT_AMBULATORY_CARE_PROVIDER_SITE_OTHER): Payer: 59

## 2020-07-05 ENCOUNTER — Other Ambulatory Visit: Payer: Self-pay

## 2020-07-05 ENCOUNTER — Other Ambulatory Visit: Payer: 59

## 2020-07-05 DIAGNOSIS — E0500 Thyrotoxicosis with diffuse goiter without thyrotoxic crisis or storm: Secondary | ICD-10-CM | POA: Diagnosis not present

## 2020-07-05 DIAGNOSIS — H16211 Exposure keratoconjunctivitis, right eye: Secondary | ICD-10-CM | POA: Diagnosis not present

## 2020-07-05 DIAGNOSIS — E0590 Thyrotoxicosis, unspecified without thyrotoxic crisis or storm: Secondary | ICD-10-CM

## 2020-07-05 LAB — CBC
HCT: 38.7 % (ref 36.0–46.0)
Hemoglobin: 12.8 g/dL (ref 12.0–15.0)
MCHC: 33.2 g/dL (ref 30.0–36.0)
MCV: 83.8 fl (ref 78.0–100.0)
Platelets: 186 10*3/uL (ref 150.0–400.0)
RBC: 4.62 Mil/uL (ref 3.87–5.11)
RDW: 14 % (ref 11.5–15.5)
WBC: 10 10*3/uL (ref 4.0–10.5)

## 2020-07-05 LAB — ALT: ALT: 35 U/L (ref 0–35)

## 2020-07-05 LAB — TSH: TSH: <0.01 u[IU]/mL — ABNORMAL LOW (ref 0.35–4.50)

## 2020-07-05 LAB — T4, FREE: Free T4: 0.89 ng/dL (ref 0.60–1.60)

## 2020-07-05 LAB — T3, FREE: T3, Free: 3.4 pg/mL (ref 2.3–4.2)

## 2020-07-07 ENCOUNTER — Encounter: Payer: Self-pay | Admitting: Endocrinology

## 2020-07-07 ENCOUNTER — Ambulatory Visit: Payer: 59 | Admitting: Endocrinology

## 2020-07-07 ENCOUNTER — Other Ambulatory Visit: Payer: Self-pay

## 2020-07-07 VITALS — BP 102/62 | HR 70 | Wt 147.4 lb

## 2020-07-07 DIAGNOSIS — E0590 Thyrotoxicosis, unspecified without thyrotoxic crisis or storm: Secondary | ICD-10-CM | POA: Diagnosis not present

## 2020-07-07 NOTE — Progress Notes (Signed)
Patient ID: [name], female   DOB: [birth year], 60 y.o.   MRN: [id number]                                                                                                               Reason for Appointment:  Hyperthyroidism, follow-up visit   Chief complaint: Follow-up   History of Present Illness:   Baseline history: 4 weeks prior to her initial visit she had symptoms of palpitations, dizziness, feeling excessively warm/sweaty, shakiness of her legs, and weight loss The patient had last 15 about  lbs since these symptoms started Apparently she had gastroenteritis-like symptoms for about 5 days right after her Covid booster shot on 01/20/2020 also associated with significant malaise and weakness She was also feeling like her heart was racing and her breathing was difficult Despite her normal appetite she has lost weight  Recent history:  Initially for treatment she was started on methimazole 10 mg twice daily With this she had resolution of her symptoms of palpitations, breathing difficulty, feeling warm and shakiness  On her visit in 11/21 even though she was not complaining of unusual fatigue she was hypothyroid with free T4 only 0.43 She was told to reduce her dose to 7.5 mg methimazole daily This was subsequently increased to 10 mg and then 15 mg   With increasing her dose she has had no heat intolerance, no recent palpitations or shakiness No recent weight change She has been regular with her medications   She was just approved for Tepezza infusions and is waiting to be scheduled  Labs showing free T4 to be still relatively high at 1.5 with suppressed TSH  Ophthalmopathy: She still has blurring of her vision with persistent double vision She has had 2 infusions of Tepezza Also because of corneal exposure from dry eyes she has had an adhesion procedure done of her lateral right eye lid:    Wt Readings from Last 3 Encounters:  07/07/20 147 lb 6.4 oz (66.9 kg)   06/01/20 146 lb 3.2 oz (66.3 kg)  05/03/20 147 lb 6.4 oz (66.9 kg)    02/03/2020: Total T4 = 12.2  and total T3 321, high  Lab Results  Component Value Date   FREET4 0.89 07/05/2020   FREET4 1.49 05/27/2020   FREET4 1.31 05/03/2020   T3FREE 3.4 07/05/2020   T3FREE 2.9 03/31/2020   TSH <0.01 (L) 07/05/2020   TSH <0.01 (L) 05/27/2020   TSH <0.01 Repeated and verified X2. (L) 05/03/2020    Lab Results  Component Value Date   THYROTRECAB 33.30 (H) 05/03/2020   THYROTRECAB 15.30 (H) 02/23/2020     Allergies as of 07/07/2020      Reactions   Methylprednisolone Hives   Solu-medrol [methylprednisolone Sodium Succ] Hives, Itching   Cephalexin Hives, Itching   Cephalexin Hives, Itching      Medication List       Accurate as of July 07, 2020  1:28 PM. If you have any questions, ask your nurse or doctor.  ergocalciferol 1.25 MG (50000 UT) capsule Commonly known as: VITAMIN D2 Vitamin D2 1,250 mcg (50,000 unit) capsule  TAKE 1 CAPSULE (50,000 UNITS TOTAL) BY MOUTH EVERY 7 (SEVEN) DAYS.   FLUoxetine 20 MG capsule Commonly known as: PROZAC TAKE 3 CAPSULES (60 MG TOTAL) BY MOUTH DAILY   lisdexamfetamine 60 MG capsule Commonly known as: VYVANSE Take 1 capsule (60 mg total) by mouth every morning.   methimazole 10 MG tablet Commonly known as: TAPAZOLE 1 tablet in a.m. and half tablet in p.m.   pantoprazole 40 MG tablet Commonly known as: PROTONIX Take 40 mg by mouth daily.   Pfizer-BioNTech COVID-19 Vacc 30 MCG/0.3ML injection Generic drug: COVID-19 mRNA vaccine (Pfizer)   VUMERITY PO Take by mouth.   Vumerity 231 MG Cpdr Generic drug: Diroximel Fumarate Take 462 mg by mouth 2 (two) times daily.           Past Medical History:  Diagnosis Date  . ADD (attention deficit disorder with hyperactivity)   . Anxiety   . Arthritis   . Cancer ([place])    Skin cancer- basal cell, squamous cell  . Cervical dysplasia   . GERD (gastroesophageal reflux disease)    . Headache    migraine  . Hepatitis    Pt states Core and surface antibioties for Hepatitis whens he was 31  . Mitral valve prolapse   . Multiple sclerosis ([place])   . Multiple sclerosis ([place])   . Tendonitis in rigtht wrist     Past Surgical History:  Procedure Laterality Date  . ABDOMINAL HYSTERECTOMY  2004   ovaries retained  . BACK SURGERY    . heart ablation     15 yrs ago for a fib by Dr.Klein, everything ok  . KNEE ARTHROSCOPY WITH SUBCHONDROPLASTY Left 11/13/2017   Procedure: LEFT KNEE ARTHROSCOPY WITH SUBCHONDROPLASTY, PARTIAL MEDIAL MENISCECTOMY, SYNOVECTOMY;  Surgeon: Leandrew Koyanagi, MD;  Location: [facility];  Service: Orthopedics;  Laterality: Left;  . TUBAL LIGATION      Family History  Problem Relation Age of Onset  . Depression Mother   . Stroke Mother   . Heart disease Mother   . Dementia Mother   . Hypothyroidism Mother   . Cancer Neg Hx   . Diabetes Neg Hx     Social History:  reports that she has been smoking cigarettes. She has been smoking about 0.25 packs per day. She has never used smokeless tobacco. She reports current alcohol use. She reports that she does not use drugs.  Allergies:  Allergies  Allergen Reactions  . Methylprednisolone Hives  . Solu-Medrol [Methylprednisolone Sodium Succ] Hives and Itching  . Cephalexin Hives and Itching  . Cephalexin Hives and Itching     Review of Systems     Examination:   BP 102/62 (BP Location: Left Arm, Patient Position: Sitting)   Pulse 70   Wt 147 lb 6.4 oz (66.9 kg)   SpO2 99%   BMI 23.09 kg/m   Mild swelling of the eyelids No redness of conjunctiva and less still present   The thyroid is about 1-1/2 times normal on the left Right side is not clearly palpable  No fine tremors are present. Deep tendon reflexes at biceps are normal    Assessment/Plan:   Hyperthyroidism, secondary to Graves' disease   She is on methimazole and now taking 15 mg daily since her last  visit Thyroid levels are back to normal Her thyroid enlargement is improving  She will continue her dosage  unchanged of methimazole Most likely needs I-131 treatment for long-term control but will need to wait till her ophthalmopathy is quiescent    Graves' ophthalmopathy: Managed with Tepezza by her oculoplastic surgeon    Elayne Snare 07/07/2020, 1:28 PM    Note: This office note was prepared with Dragon voice recognition system technology. Any transcriptional errors that result from this process are unintentional.

## 2020-07-19 ENCOUNTER — Other Ambulatory Visit: Payer: Self-pay

## 2020-07-19 ENCOUNTER — Ambulatory Visit: Payer: 59 | Admitting: Cardiology

## 2020-07-19 ENCOUNTER — Encounter: Payer: Self-pay | Admitting: Cardiology

## 2020-07-19 VITALS — BP 118/74 | HR 76 | Ht 67.0 in | Wt 146.0 lb

## 2020-07-19 DIAGNOSIS — R0609 Other forms of dyspnea: Secondary | ICD-10-CM

## 2020-07-19 DIAGNOSIS — R0600 Dyspnea, unspecified: Secondary | ICD-10-CM

## 2020-07-19 DIAGNOSIS — E785 Hyperlipidemia, unspecified: Secondary | ICD-10-CM

## 2020-07-19 NOTE — Progress Notes (Signed)
Cardiology Office Note:    Date:  07/19/2020   ID:  [name], DOB 04/13/1961, MRN [id number]  PCP:  Patient, No Pcp Per   [facility]  Cardiologist: Ena Dawley, MD Advanced Practice Provider:  No care team member to display Electrophysiologist:  None   Referring MD: No ref. provider found   Reason for visit: Dyspnea on exertion post Covid booster  History of Present Illness:    [name] is a 60 y.o. female with a hx of hypothyroidism, multiple sclerosis who experienced severe reaction to Covid booster with exacerbation of her hyperthyroidism as well as significant dyspnea on exertion.  At that time she underwent an echocardiogram that showed normal LVEF 60 to 88% grade 1 diastolic dysfunction, normal RV function with normal right RVSP, no valvular abnormalities. She also had an EKG done that was normal. Since then her symptoms have resolved, she is back to her normal self with no dyspnea and no chest pain.  No palpitation no dizziness or syncope.  She is being treated for hyperthyroidism with methimazole. Her father had myocardial infarction in his early 28s.  She had hysterectomy at age of 52.  Past Medical History:  Diagnosis Date  . ADD (attention deficit disorder with hyperactivity)   . Anxiety   . Arthritis   . Cancer ([place])    Skin cancer- basal cell, squamous cell  . Cervical dysplasia   . GERD (gastroesophageal reflux disease)   . Headache    migraine  . Hepatitis    Pt states Core and surface antibioties for Hepatitis whens he was 45  . Mitral valve prolapse   . Multiple sclerosis ([place])   . Multiple sclerosis ([place])   . Tendonitis in rigtht wrist    Past Surgical History:  Procedure Laterality Date  . ABDOMINAL HYSTERECTOMY  2004   ovaries retained  . BACK SURGERY    . heart ablation     15 yrs ago for a fib by Dr.Klein, everything ok  . KNEE ARTHROSCOPY WITH SUBCHONDROPLASTY Left 11/13/2017   Procedure: LEFT KNEE  ARTHROSCOPY WITH SUBCHONDROPLASTY, PARTIAL MEDIAL MENISCECTOMY, SYNOVECTOMY;  Surgeon: Leandrew Koyanagi, MD;  Location: [facility];  Service: Orthopedics;  Laterality: Left;  . TUBAL LIGATION      Current Medications: Current Meds  Medication Sig  . FLUoxetine (PROZAC) 20 MG capsule TAKE 3 CAPSULES (60 MG TOTAL) BY MOUTH DAILY  . lisdexamfetamine (VYVANSE) 60 MG capsule Take 1 capsule (60 mg total) by mouth every morning.  . methimazole (TAPAZOLE) 10 MG tablet 1 tablet in a.m. and half tablet in p.m.  [name] pantoprazole (PROTONIX) 40 MG tablet Take 40 mg by mouth daily.  . VUMERITY 231 MG CPDR Take 462 mg by mouth 2 (two) times daily.     Allergies:   Methylprednisolone, Solu-medrol [methylprednisolone sodium succ], Cephalexin, and Cephalexin   Social History   Socioeconomic History  . Marital status: Divorced    Spouse name: Not on file  . Number of children: Not on file  . Years of education: Not on file  . Highest education level: Not on file  Occupational History  . Not on file  Tobacco Use  . Smoking status: Current Some Day Smoker    Packs/day: 0.25    Types: Cigarettes  . Smokeless tobacco: Never Used  Vaping Use  . Vaping Use: Never used  Substance and Sexual Activity  . Alcohol use: Yes    Alcohol/week: 0.0 standard drinks  Comment: Occasioanl glass of wine   . Drug use: No  . Sexual activity: Yes    Birth control/protection: Surgical  Other Topics Concern  . Not on file  Social History Narrative  . Not on file   Social Determinants of Health   Financial Resource Strain: Not on file  Food Insecurity: Not on file  Transportation Needs: Not on file  Physical Activity: Not on file  Stress: Not on file  Social Connections: Not on file     Family History: The patient's family history includes Dementia in her mother; Depression in her mother; Heart disease in her mother; Hypothyroidism in her mother; Stroke in her mother. There is no history of  Cancer or Diabetes.  ROS:   Please see the history of present illness.    All other systems reviewed and are negative.  EKGs/Labs/Other Studies Reviewed:    The following studies were reviewed today:  EKG:  EKG is ordered today.  The ekg ordered today demonstrates normal sinus rhythm, normal EKG.  Recent Labs: 03/31/2020: BUN 21; Creatinine, Ser 0.51; Potassium 4.0; Sodium 138 07/05/2020: ALT 35; Hemoglobin 12.8; Platelets 186.0; TSH <0.01  Recent Lipid Panel No results found for: CHOL, TRIG, HDL, CHOLHDL, VLDL, LDLCALC, LDLDIRECT  Physical Exam:    VS:  BP 118/74   Pulse 76   Ht _0  (1.702 m)   Wt 146 lb (66.2 kg)   SpO2 98%   BMI 22.87 kg/m     Wt Readings from Last 3 Encounters:  07/19/20 146 lb (66.2 kg)  07/07/20 147 lb 6.4 oz (66.9 kg)  06/01/20 146 lb 3.2 oz (66.3 kg)    GEN:  Well nourished, well developed in no acute distress HEENT: Normal NECK: No JVD; No carotid bruits LYMPHATICS: No lymphadenopathy CARDIAC: RRR, no murmurs, rubs, gallops RESPIRATORY:  Clear to auscultation without rales, wheezing or rhonchi  ABDOMEN: Soft, non-tender, non-distended MUSCULOSKELETAL:  No edema; No deformity  SKIN: Warm and dry NEUROLOGIC:  Alert and oriented x 3 PSYCHIATRIC:  Normal affect   ASSESSMENT:    1. Hyperlipidemia, unspecified hyperlipidemia type   2. DOE (dyspnea on exertion)    PLAN:    In order of problems listed above:  1. Dyspnea on exertion -post Covid infection, normal age-appropriate echo, normal EKG, symptoms have resolved. 2. Lipids -never checked before, will obtain today.  Medication Adjustments/Labs and Tests Ordered: Current medicines are reviewed at length with the patient today.  Concerns regarding medicines are outlined above.  Orders Placed This Encounter  Procedures  . Lipid panel  . Comprehensive metabolic panel   No orders of the defined types were placed in this encounter.   Patient Instructions  Medication Instructions:   Your physician recommends that you continue on your current medications as directed. Please refer to the Current Medication list given to you today. *If you need a refill on your cardiac medications before your next appointment, please call your pharmacy*   Lab Work: Tomorrow CMET, Lipids Please come fasting If you have labs (blood work) drawn today and your tests are completely normal, you will receive your results only by: [name] MyChart Message (if you have MyChart) OR . A paper copy in the mail If you have any lab test that is abnormal or we need to change your treatment, we will call you to review the results.     Follow-Up: Follow up as needed with Dr. Gwyndolyn Kaufman        Signed, Ena Dawley, MD  07/19/2020 4:45 PM    [facility] Is 1/21 dog which does booster and then Covid was that which is a September 2 [place] and you had some shortness of breath and ever get better MAC

## 2020-07-19 NOTE — Patient Instructions (Signed)
Medication Instructions:  Your physician recommends that you continue on your current medications as directed. Please refer to the Current Medication list given to you today. *If you need a refill on your cardiac medications before your next appointment, please call your pharmacy*   Lab Work: Tomorrow CMET, Lipids Please come fasting If you have labs (blood work) drawn today and your tests are completely normal, you will receive your results only by: [name] MyChart Message (if you have MyChart) OR . A paper copy in the mail If you have any lab test that is abnormal or we need to change your treatment, we will call you to review the results.     Follow-Up: Follow up as needed with Dr. Gwyndolyn Kaufman

## 2020-07-20 ENCOUNTER — Other Ambulatory Visit: Payer: 59 | Admitting: *Deleted

## 2020-07-20 DIAGNOSIS — E785 Hyperlipidemia, unspecified: Secondary | ICD-10-CM

## 2020-07-20 LAB — COMPREHENSIVE METABOLIC PANEL
ALT: 20 IU/L (ref 0–32)
AST: 18 IU/L (ref 0–40)
Albumin/Globulin Ratio: 2 (ref 1.2–2.2)
Albumin: 4.4 g/dL (ref 3.8–4.9)
Alkaline Phosphatase: 104 IU/L (ref 44–121)
BUN/Creatinine Ratio: 33 — ABNORMAL HIGH (ref 9–23)
BUN: 19 mg/dL (ref 6–24)
Bilirubin Total: 0.3 mg/dL (ref 0.0–1.2)
CO2: 26 mmol/L (ref 20–29)
Calcium: 9.1 mg/dL (ref 8.7–10.2)
Chloride: 101 mmol/L (ref 96–106)
Creatinine, Ser: 0.57 mg/dL (ref 0.57–1.00)
Globulin, Total: 2.2 g/dL (ref 1.5–4.5)
Glucose: 100 mg/dL — ABNORMAL HIGH (ref 65–99)
Potassium: 4.6 mmol/L (ref 3.5–5.2)
Sodium: 141 mmol/L (ref 134–144)
Total Protein: 6.6 g/dL (ref 6.0–8.5)
eGFR: 105 mL/min/{1.73_m2} (ref >59)

## 2020-07-20 LAB — LIPID PANEL
Chol/HDL Ratio: 2.9 ratio (ref 0.0–4.4)
Cholesterol, Total: 209 mg/dL — ABNORMAL HIGH (ref 100–199)
HDL: 72 mg/dL (ref >39)
LDL Chol Calc (NIH): 124 mg/dL — ABNORMAL HIGH (ref 0–99)
Triglycerides: 74 mg/dL (ref 0–149)
VLDL Cholesterol Cal: 13 mg/dL (ref 5–40)

## 2020-07-20 MED FILL — methIMAzole 10 MG TABS: 10 | 90 days supply | Qty: 135 | Fill #0

## 2020-07-20 MED FILL — PANTOPRAZOLE SOD DR 40 MG T: 40 | 90 days supply | Qty: 90 | Fill #2

## 2020-07-26 DIAGNOSIS — E0500 Thyrotoxicosis with diffuse goiter without thyrotoxic crisis or storm: Secondary | ICD-10-CM | POA: Diagnosis not present

## 2020-07-26 DIAGNOSIS — E0590 Thyrotoxicosis, unspecified without thyrotoxic crisis or storm: Secondary | ICD-10-CM | POA: Diagnosis not present

## 2020-08-02 ENCOUNTER — Other Ambulatory Visit: Payer: Self-pay

## 2020-08-02 ENCOUNTER — Other Ambulatory Visit (INDEPENDENT_AMBULATORY_CARE_PROVIDER_SITE_OTHER): Payer: 59

## 2020-08-02 DIAGNOSIS — E0590 Thyrotoxicosis, unspecified without thyrotoxic crisis or storm: Secondary | ICD-10-CM | POA: Diagnosis not present

## 2020-08-02 LAB — T3, FREE: T3, Free: 3 pg/mL (ref 2.3–4.2)

## 2020-08-02 LAB — TSH: TSH: 22.17 u[IU]/mL — ABNORMAL HIGH (ref 0.35–4.50)

## 2020-08-03 ENCOUNTER — Ambulatory Visit: Payer: 59 | Admitting: Neurology

## 2020-08-03 ENCOUNTER — Encounter: Payer: Self-pay | Admitting: Neurology

## 2020-08-03 VITALS — BP 122/70 | HR 86 | Ht 67.0 in | Wt 146.0 lb

## 2020-08-03 DIAGNOSIS — E079 Disorder of thyroid, unspecified: Secondary | ICD-10-CM | POA: Insufficient documentation

## 2020-08-03 DIAGNOSIS — E0590 Thyrotoxicosis, unspecified without thyrotoxic crisis or storm: Secondary | ICD-10-CM | POA: Diagnosis not present

## 2020-08-03 DIAGNOSIS — G35 Multiple sclerosis: Secondary | ICD-10-CM | POA: Diagnosis not present

## 2020-08-03 DIAGNOSIS — R350 Frequency of micturition: Secondary | ICD-10-CM

## 2020-08-03 DIAGNOSIS — E0500 Thyrotoxicosis with diffuse goiter without thyrotoxic crisis or storm: Secondary | ICD-10-CM

## 2020-08-03 DIAGNOSIS — H5789 Other specified disorders of eye and adnexa: Secondary | ICD-10-CM

## 2020-08-03 LAB — T4, FREE: Free T4: 0.29 ng/dL — ABNORMAL LOW (ref 0.60–1.60)

## 2020-08-03 NOTE — Progress Notes (Signed)
[facility]  PATIENT: [name] DOB: 12-15-1960     HISTORICAL  CHIEF COMPLAINT:  Chief Complaint  Patient presents with  . Follow-up    RM 13, alone. Last seen 02/03/2020. On Vumerity for MS. Has hypothyroidism/Graves Disease since covid booster shot. Says it is directly correlated. Sees specialist for this.    HISTORY OF PRESENT ILLNESS:  [name] is a 60 y.o.woman with relapsing remitting multiple sclerosis.   Update 08/03/2020: She has Grave's disease.  She is on methimazole and more recently [place] with benefit - less diplopia.   She has needed eyelid surgery.    Interestingly, TSH has swung from near zero to 22.   She is now on Vumerity and has no Gi issues now.   She was allergic to Ocrevus and had multiple skin cancers while on Gilenya (still has had some on other medications).   She had another Mohs a couple months ago and has no more recent skin lesions.    She feels she is doing well with her MS.   Gait is better since thyroid issues have improved.     She has incontinence..   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    She has extreme urgency with both and can't always get to a facility in time.   This is more of a problem at work so she wears pads.      She has some fatigue.    She sleeps poorly at night due to nocturia. Cognition is fine.    Mood has been worse the last month.      MS history: She was diagnosed with relapsing remitting MS around 2000 after presenting with numbness in the hands and feet.  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 She was admitted to the hospital. She then was diagnosed with MS and is to see Dr. Jacqulynn Cadet at [facility]. He started her on Betaseron.   She did fairly well on Betaseron. She did have several small exacerbations and received some steroids a couple times. Often the  exacerbations would be associated with headache as well. About 4 years ago, she opted to switch to Gilenya to have oral therapy for the MS. She has done very well on that medication without any exacerbations.  Her June 2015 MRI did show one focus that was not present on her MRI from March 2010.  Due to several skin cancers, she switched to Copaxone in 2018 but had breakthrough activity.  She switched to [place] in 2019 but had difficulty tolerating the medication.  She switched to Vumerity in early 2020.  Recent imaging: MRI brain 03/02/2019 showed a "new lesion involving the right superior temporal gyrus consistent with progression of disease.    Increased prominence of lesions in the subcortical white matter of the cingulate gyrus bilaterally, right greater than left. This could impact symptoms in the feet or lower extremities and is also consistent with progression of disease.  Progressive signal change in the left sylvian fissure. No enhancement or restricted diffusion to suggest active demyelination."  MRI of the cervical spine 02/24/2004 showed a normal spinal cord and a disc protrusion to the right at C5-C6 causing right C6 nerve root encroachment.  MRI of the thoracic spine 08/16/2005 showed disc protrusion at T9-T10 and milder protrusions elsewhere but no evidence of demyelination  ALLERGIES: Allergies  Allergen Reactions  . Methylprednisolone Hives  . Solu-Medrol [Methylprednisolone Sodium Succ] Hives and Itching    Wheezing (was getting with Ocrevus as well)  . Cephalexin Hives and Itching  . Cephalexin Hives and Itching    HOME MEDICATIONS:  Current Outpatient Medications:  .  FLUoxetine (PROZAC) 20 MG capsule, TAKE 3 CAPSULES (60 MG TOTAL) BY MOUTH DAILY, Disp: 270 capsule, Rfl: 3 .  lisdexamfetamine (VYVANSE) 60 MG capsule, Take 1 capsule (60 mg total) by mouth every morning., Disp: 90 capsule, Rfl: 0 .  methimazole (TAPAZOLE) 10 MG tablet, 1 tablet in a.m. and half tablet in  p.m., Disp: 135 tablet, Rfl: 1 .  pantoprazole (PROTONIX) 40 MG tablet, Take 40 mg by mouth daily., Disp: , Rfl:  .  VUMERITY 231 MG CPDR, Take 462 mg by mouth 2 (two) times daily., Disp: 120 capsule, Rfl: 11  PAST MEDICAL HISTORY: Past Medical History:  Diagnosis Date  . ADD (attention deficit disorder with hyperactivity)   . Anxiety   . Arthritis   . Cancer ([place])    Skin cancer- basal cell, squamous cell  . Cervical dysplasia   . GERD (gastroesophageal reflux disease)   . Headache    migraine  . Hepatitis    Pt states Core and surface antibioties for Hepatitis whens he was 81  . Mitral valve prolapse   . Multiple sclerosis ([place])   . Multiple sclerosis ([place])   . Tendonitis in rigtht wrist     PAST SURGICAL HISTORY: Past Surgical History:  Procedure Laterality Date  . ABDOMINAL HYSTERECTOMY  2004   ovaries retained  . BACK SURGERY    . heart ablation     15 yrs ago for a fib by Dr.Klein, everything ok  . KNEE ARTHROSCOPY WITH SUBCHONDROPLASTY Left 11/13/2017   Procedure: LEFT KNEE ARTHROSCOPY WITH SUBCHONDROPLASTY, PARTIAL MEDIAL MENISCECTOMY, SYNOVECTOMY;  Surgeon: Leandrew Koyanagi, MD;  Location: [facility];  Service: Orthopedics;  Laterality: Left;  . TUBAL LIGATION      FAMILY HISTORY: Family History  Problem Relation Age of Onset  . Depression Mother   . Stroke Mother   . Heart disease Mother   . Dementia Mother   . Hypothyroidism Mother   . Cancer Neg Hx   . Diabetes Neg Hx     SOCIAL HISTORY:  Social History   Socioeconomic History  . Marital status: Divorced    Spouse name: Not on file  . Number of children: Not on file  . Years of education: Not on file  . Highest education level: Not on file  Occupational History  . Not on file  Tobacco Use  . Smoking status: Current Some Day Smoker    Packs/day: 0.25    Types: Cigarettes  . Smokeless tobacco: Never Used  Vaping Use  . Vaping Use: Never used  Substance and Sexual Activity  .  Alcohol use: Yes    Alcohol/week: 0.0 standard drinks    Comment: Occasioanl glass of wine   . Drug use: No  . Sexual activity: Yes    Birth control/protection: Surgical  Other Topics Concern  . Not on file  Social History Narrative  . Not on file   Social Determinants of Health   Financial Resource Strain: Not on file  Food Insecurity: Not on file  Transportation Needs: Not on file  Physical Activity: Not on file  Stress: Not on file  Social Connections: Not on file  Intimate Partner Violence: Not on file  PHYSICAL EXAM   BP 122/70 (BP Location: Right Arm, Patient Position: Sitting, Cuff Size: Normal)   Pulse 86   Ht 5\' 7"  (1.702 m)   Wt 146 lb (66.2 kg)   SpO2 98%   BMI 22.87 kg/m    General: The patient is well-developed and well-nourished and in no acute distress  INeurologic Exam  Mental status: The patient is alert and oriented x 3 at the time of the examination. The patient has apparent normal recent and remote memory, with an apparently normal attention span and concentration ability.   Speech is normal.  Cranial nerves: Extraocular movements are full.  Color vision was symmetric.  Facial strength is normal.  Trapezius strength is normal.  Hearing is normal.  Motor:  Muscle bulk is normal.  Muscle tone is increased in the legs.  She has reduced strength in the right leg relative to the left  Sensory: Sensory testing is intact to touch and vibration .    Coordination: Cerebellar testing reveals good finger-nose-finger .  Heel-to-shin is reduced in the right leg.  Gait and station: Station is normal.  There is a mild right foot drop.  Tandem gait is mildly wide.  Romberg is negative.  Reflexes: Deep tendon reflexes are symmetric and normal bilaterally.     DIAGNOSTIC DATA (LABS, IMAGING, TESTING) - I reviewed patient records, labs, notes, testing and imaging myself where available.  Lab Results  Component Value Date   WBC 10.0 07/05/2020   HGB 12.8  07/05/2020   HCT 38.7 07/05/2020   MCV 83.8 07/05/2020   PLT 186.0 07/05/2020      Component Value Date/Time   NA 141 07/20/2020 0751   K 4.6 07/20/2020 0751   CL 101 07/20/2020 0751   CO2 26 07/20/2020 0751   GLUCOSE 100 (H) 07/20/2020 0751   GLUCOSE 95 03/31/2020 1422   BUN 19 07/20/2020 0751   CREATININE 0.57 07/20/2020 0751   CALCIUM 9.1 07/20/2020 0751   PROT 6.6 07/20/2020 0751   ALBUMIN 4.4 07/20/2020 0751   AST 18 07/20/2020 0751   ALT 20 07/20/2020 0751   ALKPHOS 104 07/20/2020 0751   BILITOT 0.3 07/20/2020 0751   GFRNONAA 118 02/03/2020 0921   GFRAA 136 02/03/2020 0921       ASSESSMENT AND PLAN  MS (multiple sclerosis) (HCC)  Hyperthyroidism  Thyroid eye disease  Urinary frequency   1.   Continue Vumerity.  She will be having blood work later this week at endocrinology and she will ask if they can add a CBC with differential.   2.   Continue to f/u with urology for incontinence. 3.   She will return to see [place] in 6 months or sooner if any new or worsening neurologic symptoms.   Nelton Amsden A. Felecia Shelling, MD, PhD, Charlynn Grimes 12/31/7515, 0:01 PM Certified in Neurology, Clinical Neurophysiology, Sleep Medicine, Pain Medicine and Neuroimaging  [facility] [address] [place], [place] [address] [phone]

## 2020-08-04 ENCOUNTER — Ambulatory Visit (INDEPENDENT_AMBULATORY_CARE_PROVIDER_SITE_OTHER): Payer: 59 | Admitting: Endocrinology

## 2020-08-04 ENCOUNTER — Other Ambulatory Visit: Payer: Self-pay

## 2020-08-04 ENCOUNTER — Encounter: Payer: Self-pay | Admitting: Endocrinology

## 2020-08-04 VITALS — BP 108/64 | HR 81 | Resp 20 | Ht 67.0 in | Wt 146.8 lb

## 2020-08-04 DIAGNOSIS — E0590 Thyrotoxicosis, unspecified without thyrotoxic crisis or storm: Secondary | ICD-10-CM

## 2020-08-04 DIAGNOSIS — E0500 Thyrotoxicosis with diffuse goiter without thyrotoxic crisis or storm: Secondary | ICD-10-CM

## 2020-08-04 NOTE — Patient Instructions (Signed)
STOP RX FOR 2 DAYS, THEN 1/2 PILL FOR 1 WEEK THEN 1 1/2 DAILY

## 2020-08-04 NOTE — Progress Notes (Signed)
Patient ID: [name], female   DOB: [birth year], 60 y.o.   MRN: [id number]                                                                                                               Reason for Appointment:  Hyperthyroidism, follow-up visit   Chief complaint: Follow-up   History of Present Illness:   Baseline history: 4 weeks prior to her initial visit she had symptoms of palpitations, dizziness, feeling excessively warm/sweaty, shakiness of her legs, and weight loss The patient had last 15 about  lbs since these symptoms started Apparently she had gastroenteritis-like symptoms for about 5 days right after her Covid booster shot on 01/20/2020 also associated with significant malaise and weakness She was also feeling like her heart was racing and her breathing was difficult Despite her normal appetite she has lost weight  Recent history:  Initially for treatment she was started on methimazole 10 mg twice daily With this she had resolution of her symptoms of palpitations, breathing difficulty, feeling warm and shakiness  On her visit in 11/21 even though she was not complaining of unusual fatigue she was hypothyroid with free T4 only 0.43 She was told to reduce her dose to 7.5 mg methimazole daily This was subsequently increased to 10 mg and then 15 mg  Thyroid levels were back to normal on her last visit in 2/22 However for the last 2 years she has been getting more tired than usual and also some emotional changes  No recent weight change She takes 1-1/2 tablets of the 10 mg prescription although she thinks that her generic manufacturer has changed  Labs: Showing free T4 significantly below normal with TSH up to 22, T3 is still normal   Ophthalmopathy: She has blurring of her vision but with continuing her Tepezza infusions she has had less double vision She still is being treated for corneal exposure from dry eyes    Wt Readings from Last 3 Encounters:  08/04/20 146 lb  12.8 oz (66.6 kg)  08/03/20 146 lb (66.2 kg)  07/19/20 146 lb (66.2 kg)    02/03/2020: Total T4 = 12.2  and total T3 321, high  Lab Results  Component Value Date   FREET4 0.29 (L) 08/02/2020   FREET4 0.89 07/05/2020   FREET4 1.49 05/27/2020   T3FREE 3.0 08/02/2020   T3FREE 3.4 07/05/2020   T3FREE 2.9 03/31/2020   TSH 22.17 (H) 08/02/2020   TSH <0.01 (L) 07/05/2020   TSH <0.01 (L) 05/27/2020    Lab Results  Component Value Date   THYROTRECAB 33.30 (H) 05/03/2020   THYROTRECAB 15.30 (H) 02/23/2020     Allergies as of 08/04/2020      Reactions   Methylprednisolone Hives   Solu-medrol [methylprednisolone Sodium Succ] Hives, Itching   Wheezing (was getting with Ocrevus as well)   Cephalexin Hives, Itching   Cephalexin Hives, Itching      Medication List       Accurate as of August 04, 2020  2:13 PM. If  you have any questions, ask your nurse or doctor.        FLUoxetine 20 MG capsule Commonly known as: PROZAC TAKE 3 CAPSULES (60 MG TOTAL) BY MOUTH DAILY   lisdexamfetamine 60 MG capsule Commonly known as: VYVANSE Take 1 capsule (60 mg total) by mouth every morning.   methimazole 10 MG tablet Commonly known as: TAPAZOLE 1 tablet in a.m. and half tablet in p.m.   pantoprazole 40 MG tablet Commonly known as: PROTONIX Take 40 mg by mouth daily.   Vumerity 231 MG Cpdr Generic drug: Diroximel Fumarate Take 462 mg by mouth 2 (two) times daily.           Past Medical History:  Diagnosis Date  . ADD (attention deficit disorder with hyperactivity)   . Anxiety   . Arthritis   . Cancer ([place])    Skin cancer- basal cell, squamous cell  . Cervical dysplasia   . GERD (gastroesophageal reflux disease)   . Headache    migraine  . Hepatitis    Pt states Core and surface antibioties for Hepatitis whens he was 61  . Mitral valve prolapse   . Multiple sclerosis ([place])   . Multiple sclerosis ([place])   . Tendonitis in rigtht wrist     Past Surgical History:  Procedure  Laterality Date  . ABDOMINAL HYSTERECTOMY  2004   ovaries retained  . BACK SURGERY    . heart ablation     15 yrs ago for a fib by Dr.Klein, everything ok  . KNEE ARTHROSCOPY WITH SUBCHONDROPLASTY Left 11/13/2017   Procedure: LEFT KNEE ARTHROSCOPY WITH SUBCHONDROPLASTY, PARTIAL MEDIAL MENISCECTOMY, SYNOVECTOMY;  Surgeon: Leandrew Koyanagi, MD;  Location: [facility];  Service: Orthopedics;  Laterality: Left;  . TUBAL LIGATION      Family History  Problem Relation Age of Onset  . Depression Mother   . Stroke Mother   . Heart disease Mother   . Dementia Mother   . Hypothyroidism Mother   . Cancer Neg Hx   . Diabetes Neg Hx     Social History:  reports that she has been smoking cigarettes. She has been smoking about 0.25 packs per day. She has never used smokeless tobacco. She reports current alcohol use. She reports that she does not use drugs.  Allergies:  Allergies  Allergen Reactions  . Methylprednisolone Hives  . Solu-Medrol [Methylprednisolone Sodium Succ] Hives and Itching    Wheezing (was getting with Ocrevus as well)  . Cephalexin Hives and Itching  . Cephalexin Hives and Itching     Review of Systems     Examination:   BP 108/64 (BP Location: Left Arm, Patient Position: Sitting, Cuff Size: Normal)   Pulse 81   Resp 20   Ht 5\' 7"  (1.702 m)   Wt 146 lb 12.8 oz (66.6 kg)   SpO2 99%   BMI 22.99 kg/m   Mild proptosis of the eyes especially left   The thyroid is about 1-1/2 times normal on the left and about twice normal in the isthmus and the right medial lobe  Biceps reflexes show somewhat slow relaxation Skin appears normal    Assessment/Plan:   Hyperthyroidism, secondary to Graves' disease   She is on methimazole and now taking 15 mg daily  Thyroid levels which were back to normal on the last visit now show significant hypothyroidism Her thyroid enlargement is more prominent  Likely she is getting better response to methimazole and  likely has decreased thyrotropin receptor antibody  activity now Since she is hypothyroid and symptomatic she will stop her methimazole for 2 days, take 5 mg for the next 7 days and then 7.5 mg until her visit in about 6 weeks   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 Any transcriptional errors that result from this process are unintentional.

## 2020-08-05 ENCOUNTER — Other Ambulatory Visit (HOSPITAL_COMMUNITY): Payer: Self-pay | Admitting: *Deleted

## 2020-08-05 ENCOUNTER — Telehealth (HOSPITAL_COMMUNITY): Payer: Self-pay | Admitting: *Deleted

## 2020-08-05 MED ORDER — LORAZEPAM 0.5 MG PO TABS: 0.5000 mg | ORAL_TABLET | Freq: Every day | ORAL | 0 refills | Status: DC | PRN

## 2020-08-05 NOTE — Telephone Encounter (Signed)
Pt called requesting refill of Xanax 0.5mg  as she has been having issues since getting Covid booster recently as well as ongoing thyroid problems and possible surgery she says. Pt says that she has been very anxious and tearful at work as a result. Pt has an appointment scheduled for 09/08/20. Please review.

## 2020-08-05 NOTE — Telephone Encounter (Signed)
She has a history of high liver enzymes.  I would prefer Ativan or Xanax.  If she is okay please call 15 tablets of 0.5 mg Ativan to her pharmacy.

## 2020-08-05 NOTE — Telephone Encounter (Signed)
agrees

## 2020-08-10 DIAGNOSIS — H16211 Exposure keratoconjunctivitis, right eye: Secondary | ICD-10-CM | POA: Diagnosis not present

## 2020-08-10 DIAGNOSIS — H02531 Eyelid retraction right upper eyelid: Secondary | ICD-10-CM | POA: Diagnosis not present

## 2020-08-10 DIAGNOSIS — H02211 Cicatricial lagophthalmos right upper eyelid: Secondary | ICD-10-CM | POA: Diagnosis not present

## 2020-08-10 DIAGNOSIS — E0500 Thyrotoxicosis with diffuse goiter without thyrotoxic crisis or storm: Secondary | ICD-10-CM | POA: Diagnosis not present

## 2020-08-10 DIAGNOSIS — H16213 Exposure keratoconjunctivitis, bilateral: Secondary | ICD-10-CM | POA: Diagnosis not present

## 2020-08-12 ENCOUNTER — Telehealth: Payer: Self-pay | Admitting: Neurology

## 2020-08-12 ENCOUNTER — Telehealth: Payer: Self-pay | Admitting: Family Medicine

## 2020-08-12 NOTE — Telephone Encounter (Signed)
Pt called, having a headache I can't get rid of. Would like a call from the nurse

## 2020-08-12 NOTE — Telephone Encounter (Signed)
error 

## 2020-08-15 ENCOUNTER — Other Ambulatory Visit: Payer: Self-pay | Admitting: Neurology

## 2020-08-15 MED ORDER — HYDROCODONE-ACETAMINOPHEN 5-325 MG PO TABS: 1.0000 | ORAL_TABLET | Freq: Four times a day (QID) | ORAL | 0 refills | Status: DC | PRN

## 2020-08-15 MED FILL — HYDROCODON-APAP 5-325: 5-325 | 7 days supply | Qty: 30 | Fill #0

## 2020-08-15 NOTE — Telephone Encounter (Signed)
Called pt back. She has to stop taking meds except for OTC tylenol d/t eye procedure she had last Wed. She has had a nagging headache since last Tuesday. Would like something stronger called in for headache. She did not get prescribed anything post procedure. She tolerated procedure well and doing ok. Advised I will speak with MD and call her back.

## 2020-08-15 NOTE — Telephone Encounter (Signed)
Called and spoke with pt. Relayed Dr. Garth Bigness message. She verbalized understanding and appreciation. She will call back if headache does not improve.

## 2020-08-15 NOTE — Telephone Encounter (Signed)
Please let her know I will send in a prescription for hydrocodone #20  (she can take up to 3 times a day)

## 2020-08-17 DIAGNOSIS — E0590 Thyrotoxicosis, unspecified without thyrotoxic crisis or storm: Secondary | ICD-10-CM | POA: Diagnosis not present

## 2020-08-17 DIAGNOSIS — E0500 Thyrotoxicosis with diffuse goiter without thyrotoxic crisis or storm: Secondary | ICD-10-CM | POA: Diagnosis not present

## 2020-08-18 MED FILL — ERYTHROMYCIN EYE OINTMENT: 5 | 20 days supply | Qty: 7 | Fill #1

## 2020-08-24 ENCOUNTER — Other Ambulatory Visit (HOSPITAL_COMMUNITY): Payer: Self-pay

## 2020-08-24 MED ORDER — TOBRADEX 0.3-0.1 % OP OINT
TOPICAL_OINTMENT | OPHTHALMIC | 1 refills | Status: DC
  Filled 2020-08-24 (×2): qty 3.5, 14d supply, fill #0

## 2020-09-06 DIAGNOSIS — E0590 Thyrotoxicosis, unspecified without thyrotoxic crisis or storm: Secondary | ICD-10-CM | POA: Diagnosis not present

## 2020-09-06 DIAGNOSIS — E0500 Thyrotoxicosis with diffuse goiter without thyrotoxic crisis or storm: Secondary | ICD-10-CM | POA: Diagnosis not present

## 2020-09-08 ENCOUNTER — Other Ambulatory Visit (HOSPITAL_COMMUNITY): Payer: Self-pay

## 2020-09-08 ENCOUNTER — Other Ambulatory Visit: Payer: Self-pay

## 2020-09-08 ENCOUNTER — Telehealth (INDEPENDENT_AMBULATORY_CARE_PROVIDER_SITE_OTHER): Payer: 59 | Admitting: Psychiatry

## 2020-09-08 ENCOUNTER — Encounter (HOSPITAL_COMMUNITY): Payer: Self-pay | Admitting: Psychiatry

## 2020-09-08 VITALS — Wt 147.0 lb

## 2020-09-08 DIAGNOSIS — F330 Major depressive disorder, recurrent, mild: Secondary | ICD-10-CM | POA: Diagnosis not present

## 2020-09-08 DIAGNOSIS — F419 Anxiety disorder, unspecified: Secondary | ICD-10-CM | POA: Diagnosis not present

## 2020-09-08 DIAGNOSIS — F900 Attention-deficit hyperactivity disorder, predominantly inattentive type: Secondary | ICD-10-CM | POA: Diagnosis not present

## 2020-09-08 MED ORDER — LORAZEPAM 0.5 MG PO TABS
0.5000 mg | ORAL_TABLET | Freq: Every day | ORAL | 0 refills | Status: DC | PRN
  Filled 2020-09-08: qty 15, 15d supply, fill #0

## 2020-09-08 MED ORDER — LISDEXAMFETAMINE DIMESYLATE 60 MG PO CAPS
60.0000 mg | ORAL_CAPSULE | Freq: Every morning | ORAL | 0 refills | Status: DC
  Filled 2020-09-08: qty 90, 90d supply, fill #0

## 2020-09-08 NOTE — Progress Notes (Signed)
Virtual Visit via Telephone Note  I connected with [name] on 09/08/20 at  4:00 PM EDT by telephone and verified that I am speaking with the correct person using two identifiers.  Location: Patient: Home Provider: Office   I discussed the limitations, risks, security and privacy concerns of performing an evaluation and management service by telephone and the availability of in person appointments. I also discussed with the patient that there may be a patient responsible charge related to this service. The patient expressed understanding and agreed to proceed.   History of Present Illness: Patient is evaluated by phone session.  Since the last visit patient has few procedures.  After the booster shot she had abnormal labs and she was diagnosed with Graves' disease.  She was very emotional, tearful and a lot of anxiety and stress because of her general health condition.  She requested Xanax however due to high liver enzymes we prefer to give Ativan.  Patient told her eye has to close with the patch to avoid abrasion on her cornea.  She is not sure when the patch was removed.  She is working and trying to keep her job.  Patient told past few weeks were very difficult but now she is feeling better.  She is sleeping good.  She is able to do multitasking and her attention concentration is good.  She is taking Prozac given by her neurologist.  She reported her emesis stable and she is very pleased with that.  Patient denies any crying spells, feeling of hopelessness or worthlessness.  She denies any suicidal thoughts.  She is pleased that her weight is finally stable.  She has no tremor or shakes or any EPS.  Her liver function tests results are better than before.  Her TSH still high.   Past Psychiatric History: H/Odepression and ADD. Tried Zoloft,Focalin, Ritalin and Strattera. No history of inpatient or any suicidal attempt.  Recent Results (from the past 2160 hour(s))  CBC     Status: None    Collection Time: 07/05/20 11:47 AM  Result Value Ref Range   WBC 10.0 4.0 - 10.5 K/uL   RBC 4.62 3.87 - 5.11 Mil/uL   Platelets 186.0 150.0 - 400.0 K/uL   Hemoglobin 12.8 12.0 - 15.0 g/dL   HCT 38.7 36.0 - 46.0 %   MCV 83.8 78.0 - 100.0 fl   MCHC 33.2 30.0 - 36.0 g/dL   RDW 14.0 11.5 - 15.5 %  ALT     Status: None   Collection Time: 07/05/20 11:47 AM  Result Value Ref Range   ALT 35 0 - 35 U/L  T3, free     Status: None   Collection Time: 07/05/20 11:47 AM  Result Value Ref Range   T3, Free 3.4 2.3 - 4.2 pg/mL  T4, free     Status: None   Collection Time: 07/05/20 11:47 AM  Result Value Ref Range   Free T4 0.89 0.60 - 1.60 ng/dL    Comment: Specimens from patients who are undergoing biotin therapy and /or ingesting biotin supplements may contain high levels of biotin.  The higher biotin concentration in these specimens interferes with this Free T4 assay.  Specimens that contain high levels  of biotin may cause false high results for this Free T4 assay.  Please interpret results in light of the total clinical presentation of the patient.    TSH     Status: Abnormal   Collection Time: 07/05/20 11:47 AM  Result Value  Ref Range   TSH <0.01 (L) 0.35 - 4.50 uIU/mL  Lipid panel     Status: Abnormal   Collection Time: 07/20/20  7:51 AM  Result Value Ref Range   Cholesterol, Total 209 (H) 100 - 199 mg/dL   Triglycerides 74 0 - 149 mg/dL   HDL 72 >39 mg/dL   VLDL Cholesterol Cal 13 5 - 40 mg/dL   LDL Chol Calc (NIH) 124 (H) 0 - 99 mg/dL   Chol/HDL Ratio 2.9 0.0 - 4.4 ratio    Comment:                                   T. Chol/HDL Ratio                                             Men  Women                               1/2 Avg.Risk  3.4    3.3                                   Avg.Risk  5.0    4.4                                2X Avg.Risk  9.6    7.1                                3X Avg.Risk 23.4   11.0   Comprehensive metabolic panel     Status: Abnormal   Collection Time:  07/20/20  7:51 AM  Result Value Ref Range   Glucose 100 (H) 65 - 99 mg/dL   BUN 19 6 - 24 mg/dL   Creatinine, Ser 0.57 0.57 - 1.00 mg/dL   eGFR 105 >59 mL/min/1.73    Comment: **In accordance with recommendations from the NKF-ASN Task force,**   [facility] has updated its eGFR calculation to the 2021 CKD-EPI   creatinine equation that estimates kidney function without a race   variable.    BUN/Creatinine Ratio 33 (H) 9 - 23   Sodium 141 134 - 144 mmol/L   Potassium 4.6 3.5 - 5.2 mmol/L   Chloride 101 96 - 106 mmol/L   CO2 26 20 - 29 mmol/L   Calcium 9.1 8.7 - 10.2 mg/dL   Total Protein 6.6 6.0 - 8.5 g/dL   Albumin 4.4 3.8 - 4.9 g/dL   Globulin, Total 2.2 1.5 - 4.5 g/dL   Albumin/Globulin Ratio 2.0 1.2 - 2.2   Bilirubin Total 0.3 0.0 - 1.2 mg/dL   Alkaline Phosphatase 104 44 - 121 IU/L   AST 18 0 - 40 IU/L   ALT 20 0 - 32 IU/L  T3, free     Status: None   Collection Time: 08/02/20  3:13 PM  Result Value Ref Range   T3, Free 3.0 2.3 - 4.2 pg/mL  T4, free     Status: Abnormal   Collection Time: 08/02/20  3:13 PM  Result Value Ref Range  Free T4 0.29 (L) 0.60 - 1.60 ng/dL    Comment: Specimens from patients who are undergoing biotin therapy and /or ingesting biotin supplements may contain high levels of biotin.  The higher biotin concentration in these specimens interferes with this Free T4 assay.  Specimens that contain high levels  of biotin may cause false high results for this Free T4 assay.  Please interpret results in light of the total clinical presentation of the patient.    TSH     Status: Abnormal   Collection Time: 08/02/20  3:13 PM  Result Value Ref Range   TSH 22.17 (H) 0.35 - 4.50 uIU/mL    Psychiatric Specialty Exam: Physical Exam  Review of Systems  Weight 147 lb (66.7 kg).There is no height or weight on file to calculate BMI.  General Appearance: NA  Eye Contact:  NA  Speech:  Clear and Coherent  Volume:  Normal  Mood:  Anxious  Affect:  NA  Thought  Process:  Goal Directed  Orientation:  Full (Time, Place, and Person)  Thought Content:  WDL  Suicidal Thoughts:  No  Homicidal Thoughts:  No  Memory:  Immediate;   Good Recent;   Good Remote;   Good  Judgement:  Intact  Insight:  Present  Psychomotor Activity:  NA  Concentration:  Concentration: Good and Attention Span: Good  Recall:  Good  Fund of Knowledge:  Good  Language:  Good  Akathisia:  No  Handed:  Right  AIMS (if indicated):     Assets:  Communication Skills Desire for Improvement Housing Resilience Social Support Talents/Skills Transportation  ADL's:  Intact  Cognition:  WNL  Sleep:         Assessment and Plan: Major depressive disorder, recurrent.  Anxiety.  Attention deficit disorder, inattentive type.  I reviewed her blood work results.  Her TSH is still high but liver enzymes is better.  She is using Ativan only when she is very nervous, anxious and sometime before the procedure.  Continue lorazepam 0.5 mg to take as needed for anxiety.  Continue Vyvanse 60 mg daily.  Patient is getting Prozac from her neurologist.  Recommended to call us back if is any question or any concern.  Follow-up in 3 months.  Follow Up Instructions:    I discussed the assessment and treatment plan with the patient. The patient was provided an opportunity to ask questions and all were answered. The patient agreed with the plan and demonstrated an understanding of the instructions.   The patient was advised to call back or seek an in-person evaluation if the symptoms worsen or if the condition fails to improve as anticipated.  I provided 16 minutes of non-face-to-face time during this encounter.   Kathlee Nations, MD

## 2020-09-14 ENCOUNTER — Other Ambulatory Visit (HOSPITAL_COMMUNITY): Payer: Self-pay

## 2020-09-14 MED FILL — Fluoxetine HCl Cap 20 MG: ORAL | 90 days supply | Qty: 270 | Fill #0 | Status: AC

## 2020-09-19 DIAGNOSIS — H16211 Exposure keratoconjunctivitis, right eye: Secondary | ICD-10-CM | POA: Diagnosis not present

## 2020-09-22 ENCOUNTER — Other Ambulatory Visit: Payer: 59

## 2020-09-23 ENCOUNTER — Other Ambulatory Visit: Payer: Self-pay

## 2020-09-23 ENCOUNTER — Other Ambulatory Visit (INDEPENDENT_AMBULATORY_CARE_PROVIDER_SITE_OTHER): Payer: 59

## 2020-09-23 DIAGNOSIS — E0590 Thyrotoxicosis, unspecified without thyrotoxic crisis or storm: Secondary | ICD-10-CM | POA: Diagnosis not present

## 2020-09-23 DIAGNOSIS — E0500 Thyrotoxicosis with diffuse goiter without thyrotoxic crisis or storm: Secondary | ICD-10-CM | POA: Diagnosis not present

## 2020-09-23 LAB — CBC WITH DIFFERENTIAL/PLATELET
Basophils Absolute: 0.1 10*3/uL (ref 0.0–0.1)
Basophils Relative: 0.8 % (ref 0.0–3.0)
Eosinophils Absolute: 0.2 10*3/uL (ref 0.0–0.7)
Eosinophils Relative: 1.8 % (ref 0.0–5.0)
HCT: 35.1 % — ABNORMAL LOW (ref 36.0–46.0)
Hemoglobin: 11.6 g/dL — ABNORMAL LOW (ref 12.0–15.0)
Lymphocytes Relative: 55.2 % — ABNORMAL HIGH (ref 12.0–46.0)
Lymphs Abs: 4.7 10*3/uL — ABNORMAL HIGH (ref 0.7–4.0)
MCHC: 32.9 g/dL (ref 30.0–36.0)
MCV: 88.7 fl (ref 78.0–100.0)
Monocytes Absolute: 0.4 10*3/uL (ref 0.1–1.0)
Monocytes Relative: 4.2 % (ref 3.0–12.0)
Neutro Abs: 3.2 10*3/uL (ref 1.4–7.7)
Neutrophils Relative %: 38 % — ABNORMAL LOW (ref 43.0–77.0)
Platelets: 213 10*3/uL (ref 150.0–400.0)
RBC: 3.96 Mil/uL (ref 3.87–5.11)
RDW: 15.6 % — ABNORMAL HIGH (ref 11.5–15.5)
WBC: 8.6 10*3/uL (ref 4.0–10.5)

## 2020-09-23 LAB — T3, FREE: T3, Free: 2.6 pg/mL (ref 2.3–4.2)

## 2020-09-23 LAB — TSH: TSH: 14.43 u[IU]/mL — ABNORMAL HIGH (ref 0.35–4.50)

## 2020-09-23 LAB — T4, FREE: Free T4: 0.44 ng/dL — ABNORMAL LOW (ref 0.60–1.60)

## 2020-09-23 LAB — ALT: ALT: 15 U/L (ref 0–35)

## 2020-09-24 LAB — THYROTROPIN RECEPTOR AUTOABS: Thyrotropin Receptor Ab: 17.8 IU/L — ABNORMAL HIGH (ref 0.00–1.75)

## 2020-09-26 ENCOUNTER — Ambulatory Visit: Payer: 59 | Admitting: Endocrinology

## 2020-09-26 ENCOUNTER — Other Ambulatory Visit: Payer: Self-pay

## 2020-09-26 ENCOUNTER — Encounter: Payer: Self-pay | Admitting: Endocrinology

## 2020-09-26 VITALS — BP 118/82 | HR 63 | Ht 67.0 in | Wt 150.4 lb

## 2020-09-26 DIAGNOSIS — E0590 Thyrotoxicosis, unspecified without thyrotoxic crisis or storm: Secondary | ICD-10-CM | POA: Diagnosis not present

## 2020-09-26 NOTE — Patient Instructions (Signed)
No Rx for 5 days  Then Take 5mg   Dose daily 1/2 of 10mg 

## 2020-09-26 NOTE — Progress Notes (Signed)
Patient ID: [name], female   DOB: Dec 16, 1960, 60 y.o.   MRN: [id number]                                                                                                               Reason for Appointment:  Hyperthyroidism, follow-up visit   Chief complaint: Follow-up   History of Present Illness:   Baseline history: 4 weeks prior to her initial visit she had symptoms of palpitations, dizziness, feeling excessively warm/sweaty, shakiness of her legs, and weight loss The patient had last 15 about  lbs since these symptoms started Apparently she had gastroenteritis-like symptoms for about 5 days right after her Covid booster shot on 01/20/2020 also associated with significant malaise and weakness She was also feeling like her heart was racing and her breathing was difficult Despite her normal appetite she has lost weight  Recent history:  Initially for treatment she was started on methimazole 10 mg twice daily With this she had resolution of her symptoms of palpitations, breathing difficulty, feeling warm and shakiness  On her visit in 11/21 even though she was not complaining of unusual fatigue she was hypothyroid with free T4 only 0.43 She was told to reduce her dose to 7.5 mg methimazole daily This was subsequently increased to 10 mg and then 15 mg  She is now taking 1-1/2 tablets of the 10 mg daily  Although on her last visit she was complaining of feeling more tired she thinks she is fairly good right now Her medication was reduced to 5 mg and by an error her instructions were written as 1-1/2 tablets daily instead of 1 alternating with half  Has gained 4 pounds   Labs: Showing free T4 not as low but still TSH is high at 14 compared to 22   Ophthalmopathy: She has had blurring of her vision improving with continuing her Tepezza infusions Double vision is much better She has had 5 infusions   Wt Readings from Last 3 Encounters:  09/26/20 150 lb 6.4 oz (68.2 kg)   08/04/20 146 lb 12.8 oz (66.6 kg)  08/03/20 146 lb (66.2 kg)    02/03/2020: Total T4 = 12.2  and total T3 321, high  Lab Results  Component Value Date   FREET4 0.44 (L) 09/23/2020   FREET4 0.29 (L) 08/02/2020   FREET4 0.89 07/05/2020   T3FREE 2.6 09/23/2020   T3FREE 3.0 08/02/2020   T3FREE 3.4 07/05/2020   TSH 14.43 (H) 09/23/2020   TSH 22.17 (H) 08/02/2020   TSH <0.01 (L) 07/05/2020    Lab Results  Component Value Date   THYROTRECAB 17.80 (H) 09/23/2020   THYROTRECAB 33.30 (H) 05/03/2020   THYROTRECAB 15.30 (H) 02/23/2020     Allergies as of 09/26/2020      Reactions   Methylprednisolone Hives   Solu-medrol [methylprednisolone Sodium Succ] Hives, Itching   Wheezing (was getting with Ocrevus as well)   Cephalexin Hives, Itching   Cephalexin Hives, Itching      Medication List  Accurate as of Sep 26, 2020  4:28 PM. If you have any questions, ask your nurse or doctor.        erythromycin ophthalmic ointment APPLY 1 CM RIBBON TO BOTH EYES AT NIGHT TIME   erythromycin ophthalmic ointment APPLY 1 CM RIBBON TO BOTH EYES AT NIGHT TIME.   FLUoxetine 20 MG capsule Commonly known as: PROZAC TAKE 3 CAPSULES (60 MG TOTAL) BY MOUTH DAILY   HYDROcodone-acetaminophen 5-325 MG tablet Commonly known as: NORCO/VICODIN TAKE 1 TABLET BY MOUTH EVERY 6 (SIX) HOURS AS NEEDED FOR MODERATE PAIN.   LORazepam 0.5 MG tablet Commonly known as: ATIVAN Take 1 tablet (0.5 mg total) by mouth daily as needed for anxiety.   methimazole 10 MG tablet Commonly known as: TAPAZOLE TAKE 1 TABLET BY MOUTH IN THE MORNING & 1/2 TABLET IN THE EVENING   omeprazole 20 MG capsule Commonly known as: PRILOSEC TAKE 1 CAPSULE BY MOUTH ONCE DAILY   pantoprazole 40 MG tablet Commonly known as: PROTONIX Take 40 mg by mouth daily.   pantoprazole 40 MG tablet Commonly known as: PROTONIX TAKE 1 TABLET BY MOUTH ONCE A DAY AS DIRECTED   predniSONE 10 MG tablet Commonly known as: DELTASONE TAKE  6 TABS BY MOUTH ON DAY 1; 5 TABS ON DAY 2; 4 TABS ON DAY 3; 3 TABS ON DAY 4; 2 TABS ON DAY 5; 1 TAB ON DAY 6 THEN STOP.   predniSONE 10 MG tablet Commonly known as: DELTASONE TAKE 6 TABS BY MOUTH FOR 3 DAYS, 5 TABS FOR 3 DAY, 4 TABS FOR 3 DAYS, 3 TABS FOR 3 DAYS, 2 TABS FOR 3 DAYS, 1 TAB FOR 5 DAYS, 1/2 TAB FOR 5 DAYS   predniSONE 10 MG tablet Commonly known as: DELTASONE TAKE 6 TABS FOR 7 DAYS, THEN 4 TABS FOR 7 DAYS, THEN 3 TABS FOR 7 DAYS, THEN 2 TABS FOR 7 DAYS, THEN 1 TAB FOR 7 DAYS, THEN 1/2 TAB FOR 7 DAYS   sulfamethoxazole-trimethoprim 800-160 MG tablet Commonly known as: BACTRIM DS TAKE 1 TABLET BY MOUTH THREE TIMES A WEEK FOR 4 WEEKS   TobraDex ophthalmic ointment Generic drug: tobramycin-dexamethasone Apply thin amount to right eyelid 4 times a day for 1 week then daily for 1 week   Vumerity 231 MG Cpdr Generic drug: Diroximel Fumarate Take 462 mg by mouth 2 (two) times daily.   Vyvanse 60 MG capsule Generic drug: lisdexamfetamine Take 1 capsule (60 mg total) by mouth in the morning.           Past Medical History:  Diagnosis Date  . ADD (attention deficit disorder with hyperactivity)   . Anxiety   . Arthritis   . Cancer ([place])    Skin cancer- basal cell, squamous cell  . Cervical dysplasia   . GERD (gastroesophageal reflux disease)   . Headache    migraine  . Hepatitis    Pt states Core and surface antibioties for Hepatitis whens he was 31  . Mitral valve prolapse   . Multiple sclerosis ([place])   . Multiple sclerosis ([place])   . Tendonitis in rigtht wrist     Past Surgical History:  Procedure Laterality Date  . ABDOMINAL HYSTERECTOMY  2004   ovaries retained  . BACK SURGERY    . heart ablation     15 yrs ago for a fib by Dr.Klein, everything ok  . KNEE ARTHROSCOPY WITH SUBCHONDROPLASTY Left 11/13/2017   Procedure: LEFT KNEE ARTHROSCOPY WITH SUBCHONDROPLASTY, PARTIAL MEDIAL MENISCECTOMY, SYNOVECTOMY;  Surgeon: Erlinda Hong,  Marylynn Pearson, MD;  Location: [facility];  Service: Orthopedics;  Laterality: Left;  . TUBAL LIGATION      Family History  Problem Relation Age of Onset  . Depression Mother   . Stroke Mother   . Heart disease Mother   . Dementia Mother   . Hypothyroidism Mother   . Cancer Neg Hx   . Diabetes Neg Hx     Social History:  reports that she has been smoking cigarettes. She has been smoking about 0.25 packs per day. She has never used smokeless tobacco. She reports current alcohol use. She reports that she does not use drugs.  Allergies:  Allergies  Allergen Reactions  . Methylprednisolone Hives  . Solu-Medrol [Methylprednisolone Sodium Succ] Hives and Itching    Wheezing (was getting with Ocrevus as well)  . Cephalexin Hives and Itching  . Cephalexin Hives and Itching     Review of Systems     Examination:   BP 118/82   Pulse 63   Ht 5\' 7"  (1.702 m)   Wt 150 lb 6.4 oz (68.2 kg)   SpO2 99%   BMI 23.56 kg/m   No significant proptosis, has mild stare on the left No swelling around the eyes and no facial puffiness   The thyroid is about 1-1/2 times normal enlarged bilaterally      Assessment/Plan:   Hyperthyroidism, secondary to Graves' disease   She is on methimazole and now taking 15 mg daily  She still has hypothyroidism although TSH is not as high  Her thyrotropin receptor antibody is lower but still significantly high  She will now reduce her methimazole to 5 mg daily after holding it for the rest of the week   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 Any transcriptional errors that result from this process are unintentional.

## 2020-09-28 DIAGNOSIS — E0500 Thyrotoxicosis with diffuse goiter without thyrotoxic crisis or storm: Secondary | ICD-10-CM | POA: Diagnosis not present

## 2020-09-28 DIAGNOSIS — E0590 Thyrotoxicosis, unspecified without thyrotoxic crisis or storm: Secondary | ICD-10-CM | POA: Diagnosis not present

## 2020-10-26 DIAGNOSIS — E0590 Thyrotoxicosis, unspecified without thyrotoxic crisis or storm: Secondary | ICD-10-CM | POA: Diagnosis not present

## 2020-10-26 DIAGNOSIS — E0500 Thyrotoxicosis with diffuse goiter without thyrotoxic crisis or storm: Secondary | ICD-10-CM | POA: Diagnosis not present

## 2020-10-28 ENCOUNTER — Other Ambulatory Visit: Payer: Self-pay

## 2020-10-28 ENCOUNTER — Other Ambulatory Visit (INDEPENDENT_AMBULATORY_CARE_PROVIDER_SITE_OTHER): Payer: 59

## 2020-10-28 DIAGNOSIS — E0590 Thyrotoxicosis, unspecified without thyrotoxic crisis or storm: Secondary | ICD-10-CM

## 2020-10-28 LAB — T4, FREE: Free T4: 1.14 ng/dL (ref 0.60–1.60)

## 2020-10-28 LAB — TSH: TSH: 0.02 u[IU]/mL — ABNORMAL LOW (ref 0.35–4.50)

## 2020-10-31 ENCOUNTER — Other Ambulatory Visit: Payer: Self-pay

## 2020-10-31 ENCOUNTER — Encounter: Payer: Self-pay | Admitting: Endocrinology

## 2020-10-31 ENCOUNTER — Ambulatory Visit: Payer: 59 | Admitting: Endocrinology

## 2020-10-31 ENCOUNTER — Other Ambulatory Visit (HOSPITAL_COMMUNITY): Payer: Self-pay

## 2020-10-31 VITALS — BP 116/72 | HR 82 | Ht 67.5 in | Wt 147.0 lb

## 2020-10-31 DIAGNOSIS — E0590 Thyrotoxicosis, unspecified without thyrotoxic crisis or storm: Secondary | ICD-10-CM | POA: Diagnosis not present

## 2020-10-31 MED ORDER — METHIMAZOLE 5 MG PO TABS
7.5000 mg | ORAL_TABLET | Freq: Every day | ORAL | 1 refills | Status: DC
  Filled 2020-10-31: qty 135, 90d supply, fill #0

## 2020-10-31 NOTE — Patient Instructions (Signed)
75 mg daily 

## 2020-10-31 NOTE — Progress Notes (Signed)
Patient ID: [name], female   DOB: 11-19-1960, 60 y.o.   MRN: [id number]                                                                                                               Reason for Appointment:  Hyperthyroidism, follow-up visit   Chief complaint: Follow-up   History of Present Illness:   Baseline history: 4 weeks prior to her initial visit she had symptoms of palpitations, dizziness, feeling excessively warm/sweaty, shakiness of her legs, and weight loss The patient had last 15 about  lbs since these symptoms started Apparently she had gastroenteritis-like symptoms for about 5 days right after her Covid booster shot on 01/20/2020 also associated with significant malaise and weakness She was also feeling like her heart was racing and her breathing was difficult Despite her normal appetite she has lost weight  Recent history:  Initially for treatment she was started on methimazole 10 mg twice daily With this she had resolution of her symptoms of palpitations, breathing difficulty, feeling warm and shakiness  On her visit in 11/21 even though she was not complaining of unusual fatigue she was hypothyroid with free T4 only 0.43 She was told to reduce her dose to 7.5 mg methimazole daily  Her methimazole dose subsequently has been quite variable with her thyroid levels fluctuating between hyperthyroidism and hypothyroidism  She is now taking only a half tablet of the 10 mg daily  Although on her last visit she was feeling fairly good despite her free T4 still being low with high TSH her weight gain has resolved She does not think she is having any palpitations, shakiness except rarely and no heat intolerance or fatigue  She is only concerned about some difficulty swallowing foods like bread without any discomfort   Labs: Thyroid function now indicate subclinical hyperthyroidism with low TSH and higher than average free T4   Ophthalmopathy: She has had blurring of  her vision initially improved with continuing her Tepezza infusions Double vision is much better but still present She has had 7 infusions, getting them every 3 weeks   Wt Readings from Last 3 Encounters:  10/31/20 147 lb (66.7 kg)  09/26/20 150 lb 6.4 oz (68.2 kg)  08/04/20 146 lb 12.8 oz (66.6 kg)    02/03/2020: Total T4 = 12.2  and total T3 321, high  Lab Results  Component Value Date   FREET4 1.14 10/28/2020   FREET4 0.44 (L) 09/23/2020   FREET4 0.29 (L) 08/02/2020   T3FREE 2.6 09/23/2020   T3FREE 3.0 08/02/2020   T3FREE 3.4 07/05/2020   TSH 0.02 (L) 10/28/2020   TSH 14.43 (H) 09/23/2020   TSH 22.17 (H) 08/02/2020    Lab Results  Component Value Date   THYROTRECAB 17.80 (H) 09/23/2020   THYROTRECAB 33.30 (H) 05/03/2020   THYROTRECAB 15.30 (H) 02/23/2020     Allergies as of 10/31/2020       Reactions   Methylprednisolone Hives   Solu-medrol [methylprednisolone Sodium Succ] Hives, Itching   Wheezing (was  getting with Ocrevus as well)   Cephalexin Hives, Itching   Cephalexin Hives, Itching        Medication List        Accurate as of October 31, 2020  3:36 PM. If you have any questions, ask your nurse or doctor.          STOP taking these medications    omeprazole 20 MG capsule Commonly known as: PRILOSEC Stopped by: Elayne Snare, MD   predniSONE 10 MG tablet Commonly known as: DELTASONE Stopped by: Elayne Snare, MD   sulfamethoxazole-trimethoprim 800-160 MG tablet Commonly known as: BACTRIM DS Stopped by: Elayne Snare, MD   TobraDex ophthalmic ointment Generic drug: tobramycin-dexamethasone Stopped by: Elayne Snare, MD       TAKE these medications    erythromycin ophthalmic ointment APPLY 1 CM RIBBON TO BOTH EYES AT NIGHT TIME. What changed: Another medication with the same name was removed. Continue taking this medication, and follow the directions you see here. Changed by: Elayne Snare, MD   FLUoxetine 20 MG capsule Commonly known as:  PROZAC TAKE 3 CAPSULES (60 MG TOTAL) BY MOUTH DAILY   HYDROcodone-acetaminophen 5-325 MG tablet Commonly known as: NORCO/VICODIN TAKE 1 TABLET BY MOUTH EVERY 6 (SIX) HOURS AS NEEDED FOR MODERATE PAIN.   LORazepam 0.5 MG tablet Commonly known as: ATIVAN Take 1 tablet (0.5 mg total) by mouth daily as needed for anxiety.   methimazole 10 MG tablet Commonly known as: TAPAZOLE TAKE 1 TABLET BY MOUTH IN THE MORNING & 1/2 TABLET IN THE EVENING   pantoprazole 40 MG tablet Commonly known as: PROTONIX TAKE 1 TABLET BY MOUTH ONCE A DAY AS DIRECTED What changed: Another medication with the same name was removed. Continue taking this medication, and follow the directions you see here. Changed by: Elayne Snare, MD   Vumerity 231 MG Cpdr Generic drug: Diroximel Fumarate Take 462 mg by mouth 2 (two) times daily.   Vyvanse 60 MG capsule Generic drug: lisdexamfetamine Take 1 capsule (60 mg total) by mouth in the morning.            Past Medical History:  Diagnosis Date   ADD (attention deficit disorder with hyperactivity)    Anxiety    Arthritis    Cancer ([place])    Skin cancer- basal cell, squamous cell   Cervical dysplasia    GERD (gastroesophageal reflux disease)    Headache    migraine   Hepatitis    Pt states Core and surface antibioties for Hepatitis whens he was 18   Mitral valve prolapse    Multiple sclerosis ([facility])    Multiple sclerosis ([facility])    Tendonitis in rigtht wrist     Past Surgical History:  Procedure Laterality Date   ABDOMINAL HYSTERECTOMY  2004   ovaries retained   BACK SURGERY     heart ablation     15 yrs ago for a fib by Dr.Klein, everything ok   KNEE ARTHROSCOPY WITH SUBCHONDROPLASTY Left 11/13/2017   Procedure: LEFT KNEE ARTHROSCOPY WITH SUBCHONDROPLASTY, PARTIAL MEDIAL MENISCECTOMY, SYNOVECTOMY;  Surgeon: Leandrew Koyanagi, MD;  Location: [facility];  Service: Orthopedics;  Laterality: Left;   TUBAL LIGATION      Family History  Problem  Relation Age of Onset   Depression Mother    Stroke Mother    Heart disease Mother    Dementia Mother    Hypothyroidism Mother    Cancer Neg Hx    Diabetes Neg Hx  Social History:  reports that she has been smoking cigarettes. She has been smoking an average of 0.25 packs per day. She has never used smokeless tobacco. She reports current alcohol use. She reports that she does not use drugs.  Allergies:  Allergies  Allergen Reactions   Methylprednisolone Hives   Solu-Medrol [Methylprednisolone Sodium Succ] Hives and Itching    Wheezing (was getting with Ocrevus as well)   Cephalexin Hives and Itching   Cephalexin Hives and Itching     Review of Systems     Examination:   BP 116/72   Pulse 82   Ht 5' 7.5" (1.715 m)   Wt 147 lb (66.7 kg)   SpO2 99%   BMI 22.68 kg/m   No significant proptosis, has mild stare on the left No swelling around the eyes   The thyroid is about 1-1/2 times normal enlarged mostly on the right side and relatively soft No tremor Biceps reflexes slightly brisk     Assessment/Plan:   Hyperthyroidism, secondary to Graves' disease   She is on methimazole and now taking 5 mg daily  Her thyroid levels have fluctuated significantly  Her thyrotropin receptor antibody previously was relatively lower but still significantly high Since she is trending to be a little hyperthyroid again subclinically she will go up to 7.5 mg on her methimazole  We will also recheck thyrotropin receptor antibody and discussed that if she does not have any remission in the near term with methimazole may consider thyroidectomy  Graves' ophthalmopathy: Still having some difficulties and follows up with her ophthalmologist soon    Elayne Snare 10/31/2020, 3:36 PM    Note: This office note was prepared with Dragon voice recognition system technology. Any transcriptional errors that result from this process are unintentional.

## 2020-11-01 ENCOUNTER — Other Ambulatory Visit (HOSPITAL_COMMUNITY): Payer: Self-pay

## 2020-11-01 MED FILL — Erythromycin Ophth Oint 5 MG/GM: OPHTHALMIC | 7 days supply | Qty: 1 | Fill #0 | Status: CN

## 2020-11-02 ENCOUNTER — Other Ambulatory Visit (HOSPITAL_COMMUNITY): Payer: Self-pay

## 2020-11-04 ENCOUNTER — Other Ambulatory Visit (HOSPITAL_COMMUNITY): Payer: Self-pay

## 2020-11-07 ENCOUNTER — Other Ambulatory Visit (HOSPITAL_COMMUNITY): Payer: Self-pay

## 2020-11-08 ENCOUNTER — Other Ambulatory Visit (HOSPITAL_COMMUNITY): Payer: Self-pay

## 2020-11-08 MED ORDER — ERYTHROMYCIN 5 MG/GM OP OINT
TOPICAL_OINTMENT | OPHTHALMIC | 8 refills | Status: DC
  Filled 2020-11-08: qty 7, 20d supply, fill #0

## 2020-11-08 MED ORDER — ERYTHROMYCIN 5 MG/GM OP OINT: TOPICAL_OINTMENT | OPHTHALMIC | 11 refills | Status: DC

## 2020-11-08 MED FILL — Pantoprazole Sodium EC Tab 40 MG (Base Equiv): ORAL | 90 days supply | Qty: 90 | Fill #0 | Status: AC

## 2020-11-16 DIAGNOSIS — E0500 Thyrotoxicosis with diffuse goiter without thyrotoxic crisis or storm: Secondary | ICD-10-CM | POA: Diagnosis not present

## 2020-11-16 DIAGNOSIS — E0590 Thyrotoxicosis, unspecified without thyrotoxic crisis or storm: Secondary | ICD-10-CM | POA: Diagnosis not present

## 2020-11-17 ENCOUNTER — Ambulatory Visit: Payer: 59 | Admitting: Family Medicine

## 2020-12-12 ENCOUNTER — Telehealth (INDEPENDENT_AMBULATORY_CARE_PROVIDER_SITE_OTHER): Payer: 59 | Admitting: Psychiatry

## 2020-12-12 ENCOUNTER — Other Ambulatory Visit (HOSPITAL_COMMUNITY): Payer: Self-pay

## 2020-12-12 ENCOUNTER — Other Ambulatory Visit: Payer: Self-pay

## 2020-12-12 ENCOUNTER — Encounter (HOSPITAL_COMMUNITY): Payer: Self-pay | Admitting: Psychiatry

## 2020-12-12 DIAGNOSIS — F419 Anxiety disorder, unspecified: Secondary | ICD-10-CM | POA: Diagnosis not present

## 2020-12-12 DIAGNOSIS — F900 Attention-deficit hyperactivity disorder, predominantly inattentive type: Secondary | ICD-10-CM | POA: Diagnosis not present

## 2020-12-12 MED ORDER — LISDEXAMFETAMINE DIMESYLATE 60 MG PO CAPS
60.0000 mg | ORAL_CAPSULE | Freq: Every morning | ORAL | 0 refills | Status: DC
  Filled 2020-12-12 – 2021-01-13 (×3): qty 90, 90d supply, fill #0

## 2020-12-12 NOTE — Progress Notes (Signed)
Virtual Visit via Telephone Note  I connected with [name] on 12/12/20 at  3:40 PM EDT by telephone and verified that I am speaking with the correct person using two identifiers.  Location: Patient: Home Provider: Home Office   I discussed the limitations, risks, security and privacy concerns of performing an evaluation and management service by telephone and the availability of in person appointments. I also discussed with the patient that there may be a patient responsible charge related to this service. The patient expressed understanding and agreed to proceed.   History of Present Illness: Patient is evaluated by phone session.  She is taking Vyvanse as prescribed but has not taken the Ativan since the last visit.  Patient told though she is still anxious and sometime dysphoric about not having response to the infusion to help her diplopia but denies any crying spells, feeling of hopelessness or worthlessness.  She has Graves' disease and she was hoping and patient will help with diplopia.  Now she has to change her schedule because she cannot drive in the dark and in winter she may not able to drive after 5 PM because of the darkness.  She had support.  She is working and so far no issues at work but job has been busy as they are short staffed.  She feels a Vyvanse helping her attention, concentration or multitasking.  She is able to finish her assignment on time.  She has no tremor or shakes or any EPS.  She denies any hallucination, paranoia or any suicidal thoughts.  She is scheduled to see her endocrinologist in 2 weeks and if needed may adjust thyroid medication.  Her appetite is okay.  Her weight is stable.   Past Psychiatric History:  H/O depression and ADD.  Tried Zoloft, Focalin, Ritalin and Strattera.  No history of inpatient or any suicidal attempt.  Psychiatric Specialty Exam: Physical Exam  Review of Systems  Weight 147 lb (66.7 kg).There is no height or weight on file to  calculate BMI.  General Appearance: NA  Eye Contact:  NA  Speech:  Clear and Coherent  Volume:  Normal  Mood:  Dysphoric  Affect:  NA  Thought Process:  Goal Directed  Orientation:  Full (Time, Place, and Person)  Thought Content:  WDL  Suicidal Thoughts:  No  Homicidal Thoughts:  No  Memory:  Immediate;   Good Recent;   Good Remote;   Good  Judgement:  Intact  Insight:  Good  Psychomotor Activity:  NA  Concentration:  Concentration: Good and Attention Span: Good  Recall:  Good  Fund of Knowledge:  Good  Language:  Good  Akathisia:  No  Handed:  Right  AIMS (if indicated):     Assets:  Communication Skills Desire for Improvement Housing Resilience Talents/Skills Transportation  ADL's:  Intact  Cognition:  WNL  Sleep:         Assessment and Plan: Major depressive disorder, recurrent.  Anxiety.  Attention deficit disorder, inattentive type.  Patient has not used Ativan since the last visit.  Emotionally she is better but is still dysphoric because her diplopia exist and did not respond to infusion  She may have to change her lifestyle and not to drive after darkness.  She wants to continue Vyvanse as it is helping her mood and attention and focus.  She is getting Prozac from the neurologist.  Does not need any refill of lorazepam.  Recommended to call us back if is any question  or any concern.  Follow-up in 3 months.  Follow Up Instructions:    I discussed the assessment and treatment plan with the patient. The patient was provided an opportunity to ask questions and all were answered. The patient agreed with the plan and demonstrated an understanding of the instructions.   The patient was advised to call back or seek an in-person evaluation if the symptoms worsen or if the condition fails to improve as anticipated.  I provided 15 minutes of non-face-to-face time during this encounter.   Kathlee Nations, MD

## 2020-12-15 ENCOUNTER — Encounter: Payer: Self-pay | Admitting: Nurse Practitioner

## 2020-12-15 ENCOUNTER — Other Ambulatory Visit (HOSPITAL_COMMUNITY)
Admission: RE | Admit: 2020-12-15 | Discharge: 2020-12-15 | Disposition: A | Discharge status: Home / Self Care | Payer: 59 | Source: Ambulatory Visit | Attending: Nurse Practitioner | Admitting: Nurse Practitioner

## 2020-12-15 ENCOUNTER — Ambulatory Visit: Payer: 59 | Admitting: Nurse Practitioner

## 2020-12-15 ENCOUNTER — Other Ambulatory Visit: Payer: Self-pay

## 2020-12-15 VITALS — BP 126/78 | HR 74 | Temp 98.4°F | Ht 64.6 in | Wt 148.2 lb

## 2020-12-15 DIAGNOSIS — E559 Vitamin D deficiency, unspecified: Secondary | ICD-10-CM | POA: Insufficient documentation

## 2020-12-15 DIAGNOSIS — F17210 Nicotine dependence, cigarettes, uncomplicated: Secondary | ICD-10-CM | POA: Insufficient documentation

## 2020-12-15 DIAGNOSIS — Z124 Encounter for screening for malignant neoplasm of cervix: Secondary | ICD-10-CM | POA: Diagnosis not present

## 2020-12-15 DIAGNOSIS — Z0000 Encounter for general adult medical examination without abnormal findings: Secondary | ICD-10-CM | POA: Insufficient documentation

## 2020-12-15 DIAGNOSIS — Z118 Encounter for screening for other infectious and parasitic diseases: Secondary | ICD-10-CM | POA: Insufficient documentation

## 2020-12-15 DIAGNOSIS — Z7689 Persons encountering health services in other specified circumstances: Secondary | ICD-10-CM | POA: Diagnosis not present

## 2020-12-15 DIAGNOSIS — Z720 Tobacco use: Secondary | ICD-10-CM | POA: Diagnosis not present

## 2020-12-15 DIAGNOSIS — E0590 Thyrotoxicosis, unspecified without thyrotoxic crisis or storm: Secondary | ICD-10-CM

## 2020-12-15 DIAGNOSIS — N939 Abnormal uterine and vaginal bleeding, unspecified: Secondary | ICD-10-CM | POA: Diagnosis not present

## 2020-12-15 DIAGNOSIS — Z1151 Encounter for screening for human papillomavirus (HPV): Secondary | ICD-10-CM | POA: Diagnosis not present

## 2020-12-15 DIAGNOSIS — G35 Multiple sclerosis: Secondary | ICD-10-CM

## 2020-12-15 DIAGNOSIS — Z23 Encounter for immunization: Secondary | ICD-10-CM

## 2020-12-15 NOTE — Progress Notes (Signed)
I,Tianna Badgett,acting as a Education administrator for [employer], NP.,have documented all relevant documentation on the behalf of [employer], NP,as directed by  Bary Castilla, NP while in the presence of Bary Castilla, NP.  This visit occurred during the SARS-CoV-2 public health emergency.  Safety protocols were in place, including screening questions prior to the visit, additional usage of staff PPE, and extensive cleaning of exam room while observing appropriate contact time as indicated for disinfecting solutions.  Subjective:     Patient ID: [name] , female    DOB: November 03, 1960 , 60 y.o.   MRN: [id number]   Chief Complaint  Patient presents with   Establish Care    HPI  Patient is here to establish care. She has no concerns. She states that she needs to establish care with a PCP to be in compliance with insurance company. She is seen by Dr Felecia Shelling is her neurologist [place]. Dr Adele Schilder takes care of her behavioral health needs ADD. Dr Watt Climes is here gastroenterologist. Dr Tomi Likens is for her thyroid. Dr Dwyane Dee is her endocrinologist.     Past Medical History:  Diagnosis Date   ADD (attention deficit disorder with hyperactivity)    Anxiety    Arthritis    Cancer ([place])    Skin cancer- basal cell, squamous cell   Cervical dysplasia    GERD (gastroesophageal reflux disease)    Headache    migraine   Hepatitis    Pt states Core and surface antibioties for Hepatitis whens he was 18   Mitral valve prolapse    Multiple sclerosis ([place])    Multiple sclerosis ([place])    Tendonitis in rigtht wrist      Family History  Problem Relation Age of Onset   Depression Mother    Stroke Mother    Heart disease Mother    Dementia Mother    Hypothyroidism Mother    Stroke Father    Heart disease Father    Healthy Brother    Parkinson's disease Maternal Grandmother    Parkinson's disease Maternal Grandfather    Heart disease Paternal Grandmother    COPD Paternal Grandfather     Cancer Neg Hx    Diabetes Neg Hx      Current Outpatient Medications:    erythromycin ophthalmic ointment, Apply a 1cm ribbon to both eyes at night time, Disp: 3.5 g, Rfl: 8   erythromycin ophthalmic ointment, Appl a 1 cm ribbon to both eyes at night time, Disp: 3.5 g, Rfl: 11   FLUoxetine (PROZAC) 20 MG capsule, TAKE 3 CAPSULES (60 MG TOTAL) BY MOUTH DAILY, Disp: 270 capsule, Rfl: 3   HYDROcodone-acetaminophen (NORCO/VICODIN) 5-325 MG tablet, TAKE 1 TABLET BY MOUTH EVERY 6 (SIX) HOURS AS NEEDED FOR MODERATE PAIN., Disp: 30 tablet, Rfl: 0   lisdexamfetamine (VYVANSE) 60 MG capsule, Take 1 capsule (60 mg total) by mouth in the morning., Disp: 90 capsule, Rfl: 0   LORazepam (ATIVAN) 0.5 MG tablet, Take 1 tablet (0.5 mg total) by mouth daily as needed for anxiety., Disp: 15 tablet, Rfl: 0   methimazole (TAPAZOLE) 5 MG tablet, Take 1.5 tablets (7.5 mg total) by mouth daily., Disp: 135 tablet, Rfl: 1   pantoprazole (PROTONIX) 40 MG tablet, TAKE 1 TABLET BY MOUTH ONCE A DAY AS DIRECTED, Disp: 90 tablet, Rfl: 3   VUMERITY 231 MG CPDR, Take 462 mg by mouth 2 (two) times daily., Disp: 120 capsule, Rfl: 11   Allergies  Allergen Reactions   Methylprednisolone Hives   Solu-Medrol [  Methylprednisolone Sodium Succ] Hives and Itching    Wheezing (was getting with Ocrevus as well)   Cephalexin Hives and Itching   Cephalexin Hives and Itching      The patient states she uses none for birth control. Last LMP was No LMP recorded. Patient has had a hysterectomy.. Negative for Dysmenorrhea. Negative for: breast discharge, breast lump(s), breast pain and breast self exam. Associated symptoms include abnormal vaginal bleeding. Pertinent negatives include abnormal bleeding (hematology), anxiety, decreased libido, depression, difficulty falling sleep, dyspareunia, history of infertility, nocturia, sexual dysfunction, sleep disturbances, urinary incontinence, urinary urgency, vaginal discharge and vaginal itching.  Diet regular.The patient states her exercise level is    . The patient's tobacco use is:  Social History   Tobacco Use  Smoking Status Some Days   Packs/day: 0.25   Types: Cigarettes  Smokeless Tobacco Never  . She has been exposed to passive smoke. The patient's alcohol use is:  Social History   Substance and Sexual Activity  Alcohol Use Yes   Alcohol/week: 0.0 standard drinks   Comment: Occasioanl glass of wine   . Additional information: Last pap 12/15/20, next one scheduled for 3 years.    Review of Systems  Constitutional: Negative.  Negative for chills and fatigue.  HENT: Negative.  Negative for congestion and rhinorrhea.   Eyes: Negative.   Respiratory: Negative.  Negative for cough, shortness of breath and wheezing.   Cardiovascular: Negative.  Negative for chest pain and palpitations.  Gastrointestinal:  Negative for constipation, diarrhea and nausea.  Endocrine: Negative.  Negative for polydipsia, polyphagia and polyuria.  Genitourinary: Negative.  Negative for vaginal bleeding and vaginal pain.  Musculoskeletal: Negative.   Skin: Negative.   Allergic/Immunologic: Negative.   Neurological: Negative.  Negative for dizziness, numbness and headaches.  Hematological: Negative.   Psychiatric/Behavioral: Negative.      Today's Vitals   12/15/20 1526  BP: 126/78  Pulse: 74  Temp: 98.4 F (36.9 C)  TempSrc: Oral  Weight: 148 lb 3.2 oz (67.2 kg)  Height: 5' 4.6" (1.641 m)   Body mass index is 24.97 kg/m.   Objective:  Physical Exam Vitals and nursing note reviewed.  Constitutional:      Appearance: Normal appearance.  HENT:     Head: Normocephalic and atraumatic.     Right Ear: Tympanic membrane, ear canal and external ear normal. There is no impacted cerumen.     Left Ear: Tympanic membrane, ear canal and external ear normal. There is no impacted cerumen.     Nose: Nose normal. No congestion or rhinorrhea.     Mouth/Throat:     Mouth: Mucous membranes are  moist.     Pharynx: Oropharynx is clear.  Eyes:     Extraocular Movements: Extraocular movements intact.     Conjunctiva/sclera: Conjunctivae normal.     Pupils: Pupils are equal, round, and reactive to light.  Cardiovascular:     Rate and Rhythm: Normal rate and regular rhythm.     Pulses: Normal pulses.     Heart sounds: Normal heart sounds. No murmur heard. Pulmonary:     Effort: Pulmonary effort is normal. No respiratory distress.     Breath sounds: Normal breath sounds. No wheezing.  Abdominal:     General: Abdomen is flat. Bowel sounds are normal.     Palpations: Abdomen is soft.  Musculoskeletal:        General: No swelling. Normal range of motion.     Cervical back: Normal range of motion and  neck supple.  Skin:    General: Skin is warm and dry.     Capillary Refill: Capillary refill takes less than 2 seconds.  Neurological:     General: No focal deficit present.     Mental Status: She is alert and oriented to person, place, and time.  Psychiatric:        Mood and Affect: Mood normal.        Behavior: Behavior normal.        Assessment And Plan:     1. Establishing care with new doctor, encounter for -Patient is here to establish care. Martin Majestic over patient medical, family, social and surgical history. -Reviewed with patient their medications and any allergies  -Reviewed with patient their sexual orientation, drug/tobacco and alcohol use -Dicussed any new concerns with patient  -recommended patient comes in for a physical exam and complete blood work.  -Educated patient about the importance of annual screenings and immunizations.  -Advised patient to eat a healthy diet along with exercise for atleast 30-45 min atleast 4-5 days of the week.   2. Encounter for annual physical exam --Patient is here for their annual physical exam and we discussed any changes to medication and medical history.  -Behavior modification was discussed as well as diet and exercise history   -Patient will continue to exercise regularly and modify their diet.  -Recommendation for yearly physical annuals, immunization and screenings including mammogram and colonoscopy were discussed with the patient.  -Recommended intake of multivitamin, vitamin D and calcium.  -Individualized advise was given to the patient pertaining to their own health history in regards to diet, exercise, medical condition and referrals.  - CBC - Hemoglobin A1c - CMP14+EGFR - Lipid panel - Hepatitis C antibody - HIV Antibody (routine testing w rflx) - MM Digital Screening; Future - Cytology -Pap Smear  3. Multiple sclerosis (HCC) -Chronic, stable -Followed by neurologist   4. Hyperthyroidism -Followed by Endocrinologist. They do labs there for her.   5. Vitamin D deficiency -Will check and supplement if needed. Advised patient to spend atleast 15 min. Daily in sunlight.  - Vitamin D (25 hydroxy)  6. Need for Tdap vaccination - Tdap vaccine greater than or equal to 7yo IM   Follow up: if symptoms persist or do not get better.   The patient was encouraged to call or send a message through [place] for any questions or concerns.   Side effects and appropriate use of all the medication(s) were discussed with the patient today. Patient advised to use the medication(s) as directed by their healthcare provider. The patient was encouraged to read, review, and understand all associated package inserts and contact our office with any questions or concerns. The patient accepts the risks of the treatment plan and had an opportunity to ask questions.   Staying healthy and adopting a healthy lifestyle for your overall health is important. You should eat 7 or more servings of fruits and vegetables per day. You should drink plenty of water to keep yourself hydrated and your kidneys healthy. This includes about 65-80+ fluid ounces of water. Limit your intake of animal fats especially for elevated cholesterol. Avoid  highly processed food and limit your salt intake if you have hypertension. Avoid foods high in saturated/Trans fats. Along with a healthy diet it is also very important to maintain time for yourself to maintain a healthy mental health with low stress levels. You should get atleast 150 min of moderate intensity exercise weekly for a healthy heart.  Along with eating right and exercising, aim for at least 7-9 hours of sleep daily.  Eat more whole grains which includes barley, wheat berries, oats, brown rice and whole wheat pasta. Use healthy plant oils which include olive, soy, corn, sunflower and peanut. Limit your caffeine and sugary drinks. Limit your intake of fast foods. Limit milk and dairy products to one or two daily servings.   Patient was given opportunity to ask questions. Patient verbalized understanding of the plan and was able to repeat key elements of the plan. All questions were answered to their satisfaction.  Raman Josiyah Tozzi, DNP   I, Raman Nefi Musich have reviewed all documentation for this visit. The documentation on 12/15/20 for the exam, diagnosis, procedures, and orders are all accurate and complete.    THE PATIENT IS ENCOURAGED TO PRACTICE SOCIAL DISTANCING DUE TO THE COVID-19 PANDEMIC.

## 2020-12-16 LAB — CMP14+EGFR
ALT: 18 IU/L (ref 0–32)
AST: 13 IU/L (ref 0–40)
Albumin/Globulin Ratio: 2 (ref 1.2–2.2)
Albumin: 4.3 g/dL (ref 3.8–4.9)
Alkaline Phosphatase: 92 IU/L (ref 44–121)
BUN/Creatinine Ratio: 32 — ABNORMAL HIGH (ref 12–28)
BUN: 24 mg/dL (ref 8–27)
Bilirubin Total: <0.2 mg/dL (ref 0.0–1.2)
CO2: 27 mmol/L (ref 20–29)
Calcium: 8.9 mg/dL (ref 8.7–10.3)
Chloride: 100 mmol/L (ref 96–106)
Creatinine, Ser: 0.74 mg/dL (ref 0.57–1.00)
Globulin, Total: 2.2 g/dL (ref 1.5–4.5)
Glucose: 87 mg/dL (ref 65–99)
Potassium: 3.8 mmol/L (ref 3.5–5.2)
Sodium: 141 mmol/L (ref 134–144)
Total Protein: 6.5 g/dL (ref 6.0–8.5)
eGFR: 93 mL/min/{1.73_m2} (ref >59)

## 2020-12-16 LAB — CBC
Hematocrit: 35.8 % (ref 34.0–46.6)
Hemoglobin: 11.6 g/dL (ref 11.1–15.9)
MCH: 28.6 pg (ref 26.6–33.0)
MCHC: 32.4 g/dL (ref 31.5–35.7)
MCV: 88 fL (ref 79–97)
Platelets: 264 10*3/uL (ref 150–450)
RBC: 4.05 x10E6/uL (ref 3.77–5.28)
RDW: 12.9 % (ref 11.7–15.4)
WBC: 8.9 10*3/uL (ref 3.4–10.8)

## 2020-12-16 LAB — HEMOGLOBIN A1C
Est. average glucose Bld gHb Est-mCnc: 117 mg/dL
Hgb A1c MFr Bld: 5.7 % — ABNORMAL HIGH (ref 4.8–5.6)

## 2020-12-16 LAB — LIPID PANEL
Chol/HDL Ratio: 2.8 ratio (ref 0.0–4.4)
Cholesterol, Total: 213 mg/dL — ABNORMAL HIGH (ref 100–199)
HDL: 75 mg/dL (ref >39)
LDL Chol Calc (NIH): 118 mg/dL — ABNORMAL HIGH (ref 0–99)
Triglycerides: 113 mg/dL (ref 0–149)
VLDL Cholesterol Cal: 20 mg/dL (ref 5–40)

## 2020-12-16 LAB — HIV ANTIBODY (ROUTINE TESTING W REFLEX): HIV Screen 4th Generation wRfx: NONREACTIVE

## 2020-12-16 LAB — VITAMIN D 25 HYDROXY (VIT D DEFICIENCY, FRACTURES): Vit D, 25-Hydroxy: 16.7 ng/mL — ABNORMAL LOW (ref 30.0–100.0)

## 2020-12-16 LAB — HEPATITIS C ANTIBODY: Hep C Virus Ab: <0.1 s/co ratio (ref 0.0–0.9)

## 2020-12-20 ENCOUNTER — Other Ambulatory Visit (HOSPITAL_COMMUNITY): Payer: Self-pay

## 2020-12-20 ENCOUNTER — Other Ambulatory Visit: Payer: Self-pay | Admitting: Nurse Practitioner

## 2020-12-20 DIAGNOSIS — E559 Vitamin D deficiency, unspecified: Secondary | ICD-10-CM

## 2020-12-20 LAB — CYTOLOGY - PAP
Chlamydia: NEGATIVE
Comment: NEGATIVE
Comment: NEGATIVE
Comment: NEGATIVE
Comment: NORMAL
Diagnosis: NEGATIVE
High risk HPV: NEGATIVE
Neisseria Gonorrhea: NEGATIVE
Trichomonas: NEGATIVE

## 2020-12-20 MED ORDER — VITAMIN D (ERGOCALCIFEROL) 1.25 MG (50000 UNIT) PO CAPS
50000.0000 [IU] | ORAL_CAPSULE | ORAL | 1 refills | Status: DC
Start: 2020-12-20 — End: 2021-06-29
  Filled 2020-12-20: qty 12, 84d supply, fill #0

## 2020-12-21 ENCOUNTER — Other Ambulatory Visit (INDEPENDENT_AMBULATORY_CARE_PROVIDER_SITE_OTHER): Payer: 59

## 2020-12-21 ENCOUNTER — Other Ambulatory Visit: Payer: Self-pay

## 2020-12-21 ENCOUNTER — Other Ambulatory Visit (HOSPITAL_COMMUNITY): Payer: Self-pay

## 2020-12-21 DIAGNOSIS — E0590 Thyrotoxicosis, unspecified without thyrotoxic crisis or storm: Secondary | ICD-10-CM | POA: Diagnosis not present

## 2020-12-21 LAB — T3, FREE: T3, Free: 3.7 pg/mL (ref 2.3–4.2)

## 2020-12-21 LAB — TSH: TSH: 9.82 u[IU]/mL — ABNORMAL HIGH (ref 0.35–5.50)

## 2020-12-21 LAB — T4, FREE: Free T4: 0.58 ng/dL — ABNORMAL LOW (ref 0.60–1.60)

## 2020-12-21 MED FILL — Fluoxetine HCl Cap 20 MG: ORAL | 90 days supply | Qty: 270 | Fill #1 | Status: AC

## 2020-12-22 ENCOUNTER — Other Ambulatory Visit (HOSPITAL_COMMUNITY): Payer: Self-pay

## 2020-12-22 LAB — THYROTROPIN RECEPTOR AUTOABS: Thyrotropin Receptor Ab: 10.4 IU/L — ABNORMAL HIGH (ref 0.00–1.75)

## 2020-12-23 ENCOUNTER — Other Ambulatory Visit: Payer: 59

## 2020-12-26 ENCOUNTER — Ambulatory Visit: Payer: 59 | Admitting: Endocrinology

## 2020-12-26 ENCOUNTER — Telehealth: Payer: Self-pay | Admitting: Endocrinology

## 2020-12-26 NOTE — Telephone Encounter (Signed)
Pt was supposed to have an app on 12/26/2020 pt had to change app due to being sick changed it to 01/20/2021 and would like to know if she needs her labs redone closer to her visit

## 2020-12-27 NOTE — Telephone Encounter (Signed)
Pt is experiencing, muscle cramps, dizzy, nauseated and vomiting. Pt would like her labs looked at and reviewed and a call back from nurse.  Just needing to know where to patient on schedule.

## 2020-12-27 NOTE — Telephone Encounter (Signed)
Okay she is scheduled for 12/29/2020 at 1pm

## 2020-12-28 ENCOUNTER — Telehealth: Payer: Self-pay | Admitting: Endocrinology

## 2020-12-28 NOTE — Telephone Encounter (Signed)
Renay is from  [facility]  and is pt clinical nurse team manager  She Is needing last office visit notes  Fax number [phone]

## 2020-12-28 NOTE — Progress Notes (Signed)
Patient ID: [name], female   DOB: 10/17/1960, 60 y.o.   MRN: [id number]                                                                                                               Reason for Appointment:  Hyperthyroidism, follow-up visit   Chief complaint: Follow-up   History of Present Illness:   Baseline history: 4 weeks prior to her initial visit she had symptoms of palpitations, dizziness, feeling excessively warm/sweaty, shakiness of her legs, and weight loss The patient had last 15 about  lbs since these symptoms started Apparently she had gastroenteritis-like symptoms for about 5 days right after her Covid booster shot on 01/20/2020 also associated with significant malaise and weakness She was also feeling like her heart was racing and her breathing was difficult Despite her normal appetite she has lost weight  Recent history:  Initially for treatment she was started on methimazole 10 mg twice daily With this she had resolution of her symptoms of palpitations, breathing difficulty, feeling warm and shakiness  On her visit in 11/21 even though she was not complaining of unusual fatigue she was hypothyroid with free T4 only 0.43 She was told to reduce her dose to 7.5 mg methimazole daily  Her methimazole dose subsequently has been quite variable with her thyroid levels fluctuating between hyperthyroidism and hypothyroidism  She is now taking 7.'5mg'$  daily Previously on her last visit since her free T4 was slightly higher than usual and TSH suppressed her methimazole was increased 2.5 mg daily   Subsequently she has not had any new symptoms of fatigue or lethargy, no cold intolerance and no palpitations  She is currently feeling weak from her episode of gastroenteritis   Labs: Thyroid function now indicate hypothyroidism with relatively high TSH and Free T4 below normal   Thyrotropin receptor antibody has improved further but not normal as yet   Ophthalmopathy: She  has had blurring of her vision initially improved with a course of Tepezza infusions Double vision is much better    Wt Readings from Last 3 Encounters:  12/29/20 142 lb 3.2 oz (64.5 kg)  12/15/20 148 lb 3.2 oz (67.2 kg)  10/31/20 147 lb (66.7 kg)    02/03/2020: Total T4 = 12.2  and total T3 321, high  Lab Results  Component Value Date   FREET4 0.58 (L) 12/21/2020   FREET4 1.14 10/28/2020   FREET4 0.44 (L) 09/23/2020   T3FREE 3.7 12/21/2020   T3FREE 2.6 09/23/2020   T3FREE 3.0 08/02/2020   TSH 9.82 (H) 12/21/2020   TSH 0.02 (L) 10/28/2020   TSH 14.43 (H) 09/23/2020    Lab Results  Component Value Date   THYROTRECAB 10.40 (H) 12/21/2020   THYROTRECAB 17.80 (H) 09/23/2020   THYROTRECAB 33.30 (H) 05/03/2020     Allergies as of 12/29/2020       Reactions   Methylprednisolone Hives   Solu-medrol [methylprednisolone Sodium Succ] Hives, Itching   Wheezing (was getting with Ocrevus as well)   Cephalexin Hives, Itching  Cephalexin Hives, Itching        Medication List        Accurate as of December 29, 2020  1:14 PM. If you have any questions, ask your nurse or doctor.          erythromycin ophthalmic ointment Apply a 1cm ribbon to both eyes at night time   erythromycin ophthalmic ointment Appl a 1 cm ribbon to both eyes at night time   FLUoxetine 20 MG capsule Commonly known as: PROZAC TAKE 3 CAPSULES (60 MG TOTAL) BY MOUTH DAILY   HYDROcodone-acetaminophen 5-325 MG tablet Commonly known as: NORCO/VICODIN TAKE 1 TABLET BY MOUTH EVERY 6 (SIX) HOURS AS NEEDED FOR MODERATE PAIN.   lisdexamfetamine 60 MG capsule Commonly known as: VYVANSE Take 1 capsule (60 mg total) by mouth in the morning.   LORazepam 0.5 MG tablet Commonly known as: ATIVAN Take 1 tablet (0.5 mg total) by mouth daily as needed for anxiety.   methimazole 5 MG tablet Commonly known as: TAPAZOLE Take 1.5 tablets (7.5 mg total) by mouth daily.   pantoprazole 40 MG tablet Commonly  known as: PROTONIX TAKE 1 TABLET BY MOUTH ONCE A DAY AS DIRECTED   Vitamin D (Ergocalciferol) 1.25 MG (50000 UNIT) Caps capsule Commonly known as: DRISDOL Take 1 capsule (50,000 Units total) by mouth every 7 (seven) days.   Vumerity 231 MG Cpdr Generic drug: Diroximel Fumarate Take 462 mg by mouth 2 (two) times daily.            Past Medical History:  Diagnosis Date   ADD (attention deficit disorder with hyperactivity)    Anxiety    Arthritis    Cancer ([place])    Skin cancer- basal cell, squamous cell   Cervical dysplasia    GERD (gastroesophageal reflux disease)    Headache    migraine   Hepatitis    Pt states Core and surface antibioties for Hepatitis whens he was 18   Mitral valve prolapse    Multiple sclerosis ([facility])    Multiple sclerosis ([facility])    Tendonitis in rigtht wrist     Past Surgical History:  Procedure Laterality Date   ABDOMINAL HYSTERECTOMY  2004   ovaries retained   BACK SURGERY     heart ablation     15 yrs ago for a fib by Dr.Klein, everything ok   KNEE ARTHROSCOPY WITH SUBCHONDROPLASTY Left 11/13/2017   Procedure: LEFT KNEE ARTHROSCOPY WITH SUBCHONDROPLASTY, PARTIAL MEDIAL MENISCECTOMY, SYNOVECTOMY;  Surgeon: Leandrew Koyanagi, MD;  Location: [facility];  Service: Orthopedics;  Laterality: Left;   TUBAL LIGATION      Family History  Problem Relation Age of Onset   Depression Mother    Stroke Mother    Heart disease Mother    Dementia Mother    Hypothyroidism Mother    Stroke Father    Heart disease Father    Healthy Brother    Parkinson's disease Maternal Grandmother    Parkinson's disease Maternal Grandfather    Heart disease Paternal Grandmother    COPD Paternal Grandfather    Cancer Neg Hx    Diabetes Neg Hx     Social History:  reports that she has been smoking cigarettes. She has been smoking an average of .25 packs per day. She has never used smokeless tobacco. She reports current alcohol use. She reports that she does  not use drugs.  Allergies:  Allergies  Allergen Reactions   Methylprednisolone Hives   Solu-Medrol [Methylprednisolone Sodium Succ] Hives  and Itching    Wheezing (was getting with Ocrevus as well)   Cephalexin Hives and Itching   Cephalexin Hives and Itching     Review of Systems  She is concerned about feeling weak and slightly dizzy with just getting over an episode of gastroenteritis with diarrhea and vomiting She was told to see her PCP but she did not She is consuming Pedialyte   Examination:   BP 110/82   Pulse (!) 102   Ht 5' 7.5" (1.715 m)   Wt 142 lb 3.2 oz (64.5 kg)   SpO2 99%   BMI 21.94 kg/m   Standing blood pressure unchanged  Oral mucous membranes look dry   Assessment/Plan:   Hyperthyroidism, secondary to Graves' disease   She is on methimazole and now taking 7.5 mg daily  Her thyroid levels have fluctuated and recently between high and low levels even with small changes in her methimazole  With increasing her dose by 2.5 mg on last visit she is now hypothyroid  Her thyrotropin receptor antibody is still relatively high and unlikely that we can stop her methimazole  Will try her on only 2.5 mg methimazole now   Weakness post gastroenteritis: Likely to be from dehydration  Will also check electrolytes today and encourage her to increase her fluid intake further, continue Pedialyte for now  Ophthalmopathy: Has stable symptoms now and she will continue follow-up with her oculoplastic surgeon    Elayne Snare 12/29/2020, 1:14 PM    Note: This office note was prepared with Dragon voice recognition system technology. Any transcriptional errors that result from this process are unintentional.

## 2020-12-29 ENCOUNTER — Other Ambulatory Visit: Payer: Self-pay

## 2020-12-29 ENCOUNTER — Ambulatory Visit: Payer: 59 | Admitting: Endocrinology

## 2020-12-29 ENCOUNTER — Encounter: Payer: Self-pay | Admitting: Endocrinology

## 2020-12-29 VITALS — BP 110/82 | HR 102 | Ht 67.5 in | Wt 142.2 lb

## 2020-12-29 DIAGNOSIS — R197 Diarrhea, unspecified: Secondary | ICD-10-CM

## 2020-12-29 DIAGNOSIS — E0590 Thyrotoxicosis, unspecified without thyrotoxic crisis or storm: Secondary | ICD-10-CM

## 2020-12-29 LAB — TSH: TSH: 0.43 u[IU]/mL (ref 0.35–5.50)

## 2020-12-29 LAB — BASIC METABOLIC PANEL
BUN: 32 mg/dL — ABNORMAL HIGH (ref 6–23)
CO2: 33 mEq/L — ABNORMAL HIGH (ref 19–32)
Calcium: 9.8 mg/dL (ref 8.4–10.5)
Chloride: 98 mEq/L (ref 96–112)
Creatinine, Ser: 0.94 mg/dL (ref 0.40–1.20)
GFR: 66.15 mL/min (ref >60.00)
Glucose, Bld: 129 mg/dL — ABNORMAL HIGH (ref 70–99)
Potassium: 4.8 mEq/L (ref 3.5–5.1)
Sodium: 139 mEq/L (ref 135–145)

## 2020-12-29 LAB — T3, FREE: T3, Free: 4.3 pg/mL — ABNORMAL HIGH (ref 2.3–4.2)

## 2020-12-29 LAB — T4, FREE: Free T4: 1.12 ng/dL (ref 0.60–1.60)

## 2020-12-29 NOTE — Patient Instructions (Addendum)
Take 1/2 pill daily of Tapazole, hold for 3 days

## 2021-01-02 ENCOUNTER — Other Ambulatory Visit (HOSPITAL_COMMUNITY): Payer: Self-pay

## 2021-01-03 NOTE — Progress Notes (Signed)
Call patient: Thyroid was not supposed to be rechecked but was done on her recent labs again.  Appears that her thyroid level is starting to go up again.  Recommend that she take 5 mg methimazole alternating with half a tablet until her next visit

## 2021-01-06 ENCOUNTER — Other Ambulatory Visit (HOSPITAL_COMMUNITY): Payer: Self-pay | Admitting: Nurse Practitioner

## 2021-01-06 DIAGNOSIS — Z0000 Encounter for general adult medical examination without abnormal findings: Secondary | ICD-10-CM

## 2021-01-11 ENCOUNTER — Other Ambulatory Visit: Payer: Self-pay

## 2021-01-11 ENCOUNTER — Inpatient Hospital Stay (HOSPITAL_COMMUNITY): Admission: RE | Admit: 2021-01-11 | Payer: 59 | Source: Ambulatory Visit

## 2021-01-11 ENCOUNTER — Ambulatory Visit
Admission: RE | Admit: 2021-01-11 | Discharge: 2021-01-11 | Disposition: A | Discharge status: Home / Self Care | Payer: 59 | Source: Ambulatory Visit | Attending: Nurse Practitioner | Admitting: Nurse Practitioner

## 2021-01-11 DIAGNOSIS — Z1231 Encounter for screening mammogram for malignant neoplasm of breast: Secondary | ICD-10-CM | POA: Diagnosis not present

## 2021-01-11 DIAGNOSIS — Z0000 Encounter for general adult medical examination without abnormal findings: Secondary | ICD-10-CM

## 2021-01-12 DIAGNOSIS — H02534 Eyelid retraction left upper eyelid: Secondary | ICD-10-CM | POA: Diagnosis not present

## 2021-01-12 DIAGNOSIS — H02531 Eyelid retraction right upper eyelid: Secondary | ICD-10-CM | POA: Diagnosis not present

## 2021-01-12 DIAGNOSIS — E0500 Thyrotoxicosis with diffuse goiter without thyrotoxic crisis or storm: Secondary | ICD-10-CM | POA: Diagnosis not present

## 2021-01-12 DIAGNOSIS — H02211 Cicatricial lagophthalmos right upper eyelid: Secondary | ICD-10-CM | POA: Diagnosis not present

## 2021-01-12 DIAGNOSIS — H05243 Constant exophthalmos, bilateral: Secondary | ICD-10-CM | POA: Diagnosis not present

## 2021-01-12 DIAGNOSIS — H16213 Exposure keratoconjunctivitis, bilateral: Secondary | ICD-10-CM | POA: Diagnosis not present

## 2021-01-12 DIAGNOSIS — H02052 Trichiasis without entropian right lower eyelid: Secondary | ICD-10-CM | POA: Diagnosis not present

## 2021-01-12 DIAGNOSIS — H532 Diplopia: Secondary | ICD-10-CM | POA: Diagnosis not present

## 2021-01-13 ENCOUNTER — Other Ambulatory Visit (HOSPITAL_COMMUNITY): Payer: Self-pay

## 2021-01-16 ENCOUNTER — Other Ambulatory Visit: Payer: Self-pay | Admitting: Nurse Practitioner

## 2021-01-16 DIAGNOSIS — R928 Other abnormal and inconclusive findings on diagnostic imaging of breast: Secondary | ICD-10-CM

## 2021-01-17 ENCOUNTER — Other Ambulatory Visit: Payer: Self-pay | Admitting: Nurse Practitioner

## 2021-01-17 DIAGNOSIS — R928 Other abnormal and inconclusive findings on diagnostic imaging of breast: Secondary | ICD-10-CM

## 2021-01-18 ENCOUNTER — Other Ambulatory Visit: Payer: Self-pay

## 2021-01-18 ENCOUNTER — Ambulatory Visit
Admission: RE | Admit: 2021-01-18 | Discharge: 2021-01-18 | Disposition: A | Discharge status: Home / Self Care | Payer: 59 | Source: Ambulatory Visit | Attending: Nurse Practitioner | Admitting: Nurse Practitioner

## 2021-01-18 ENCOUNTER — Other Ambulatory Visit: Payer: Self-pay | Admitting: Nurse Practitioner

## 2021-01-18 DIAGNOSIS — N6311 Unspecified lump in the right breast, upper outer quadrant: Secondary | ICD-10-CM | POA: Diagnosis not present

## 2021-01-18 DIAGNOSIS — R928 Other abnormal and inconclusive findings on diagnostic imaging of breast: Secondary | ICD-10-CM

## 2021-01-18 DIAGNOSIS — R922 Inconclusive mammogram: Secondary | ICD-10-CM | POA: Diagnosis not present

## 2021-01-20 ENCOUNTER — Ambulatory Visit: Payer: 59 | Admitting: Endocrinology

## 2021-01-31 ENCOUNTER — Other Ambulatory Visit (INDEPENDENT_AMBULATORY_CARE_PROVIDER_SITE_OTHER): Payer: 59

## 2021-01-31 ENCOUNTER — Other Ambulatory Visit: Payer: Self-pay

## 2021-01-31 ENCOUNTER — Other Ambulatory Visit: Payer: Self-pay | Admitting: Endocrinology

## 2021-01-31 DIAGNOSIS — E0590 Thyrotoxicosis, unspecified without thyrotoxic crisis or storm: Secondary | ICD-10-CM | POA: Diagnosis not present

## 2021-01-31 LAB — TSH: TSH: 1.58 u[IU]/mL (ref 0.35–5.50)

## 2021-01-31 LAB — T3, FREE: T3, Free: 3 pg/mL (ref 2.3–4.2)

## 2021-01-31 LAB — T4, FREE: Free T4: 0.71 ng/dL (ref 0.60–1.60)

## 2021-02-02 ENCOUNTER — Ambulatory Visit: Payer: 59 | Admitting: General Surgery

## 2021-02-03 ENCOUNTER — Other Ambulatory Visit: Payer: Self-pay

## 2021-02-03 ENCOUNTER — Ambulatory Visit: Payer: 59 | Admitting: Endocrinology

## 2021-02-03 ENCOUNTER — Encounter: Payer: Self-pay | Admitting: Endocrinology

## 2021-02-03 VITALS — BP 116/75 | HR 75 | Ht 67.5 in | Wt 147.8 lb

## 2021-02-03 DIAGNOSIS — E0590 Thyrotoxicosis, unspecified without thyrotoxic crisis or storm: Secondary | ICD-10-CM

## 2021-02-03 NOTE — Patient Instructions (Addendum)
Take 1 1/2 tabs alternating with one '5mg'$  dose

## 2021-02-03 NOTE — Progress Notes (Signed)
Patient ID: [name], female   DOB: [birth year], 60 y.o.   MRN: [id number]                                                                                                               Reason for Appointment:  Hyperthyroidism, follow-up visit   Chief complaint: Follow-up   History of Present Illness:   Baseline history: 4 weeks prior to her initial visit she had symptoms of palpitations, dizziness, feeling excessively warm/sweaty, shakiness of her legs, and weight loss The patient had last 15 about  lbs since these symptoms started Apparently she had gastroenteritis-like symptoms for about 5 days right after her Covid booster shot on 01/20/2020 also associated with significant malaise and weakness She was also feeling like her heart was racing and her breathing was difficult Despite her normal appetite she has lost weight  Recent history:  Initially for treatment she was started on methimazole 10 mg twice daily With this she had resolution of her symptoms of palpitations, breathing difficulty, feeling warm and shakiness  On her visit in 11/21 even though she was not complaining of unusual fatigue she was hypothyroid with free T4 only 0.43 She was told to reduce her dose to 7.5 mg methimazole daily  Her methimazole dose subsequently has been quite variable with her thyroid levels fluctuating between hyperthyroidism and hypothyroidism  She is now taking 7.'5mg'$  daily  Last month her thyroid levels were inconsistent with low thyroid levels on 8/3 but high level of free T3 on 8/11 with lower TSH   Previously was on 7.5 mg methimazole and was told to take 5 mg alternating with 2.5 instead of just half tablet daily  However she misunderstood instructions and is taking 1-1/2 tablets every day  She does not complain of fatigue or cold intolerance, no palpitations or shakiness  Her weight gain may be related to recovering from gastroenteritis last month  Surprisingly her thyroid levels  have only come down slightly now with free T4 low normal but TSH and T3 levels quite normal   Labs: Thyroid function now indicate hypothyroidism with relatively high TSH and Free T4 below normal   Thyrotropin receptor antibody has improved further as of 8/22 but not normal    Ophthalmopathy: She has had blurring of her vision initially improved with a course of Tepezza infusions Double vision is much better Her oculoplastic surgeon wants her to have thyroidectomy and has referred her   Wt Readings from Last 3 Encounters:  02/03/21 147 lb 12.8 oz (67 kg)  12/29/20 142 lb 3.2 oz (64.5 kg)  12/15/20 148 lb 3.2 oz (67.2 kg)    02/03/2020: Total T4 = 12.2  and total T3 321, high  Lab Results  Component Value Date   FREET4 0.71 01/31/2021   FREET4 1.12 12/29/2020   FREET4 0.58 (L) 12/21/2020   T3FREE 3.0 01/31/2021   T3FREE 4.3 (H) 12/29/2020   T3FREE 3.7 12/21/2020   TSH 1.58 01/31/2021   TSH 0.43 12/29/2020   TSH 9.82 (H)  12/21/2020    Lab Results  Component Value Date   THYROTRECAB 10.40 (H) 12/21/2020   THYROTRECAB 17.80 (H) 09/23/2020   THYROTRECAB 33.30 (H) 05/03/2020     Allergies as of 02/03/2021       Reactions   Methylprednisolone Hives   Solu-medrol [methylprednisolone Sodium Succ] Hives, Itching   Wheezing (was getting with Ocrevus as well)   Cephalexin Hives, Itching   Cephalexin Hives, Itching        Medication List        Accurate as of February 03, 2021 11:47 AM. If you have any questions, ask your nurse or doctor.          erythromycin ophthalmic ointment Apply a 1cm ribbon to both eyes at night time   erythromycin ophthalmic ointment Appl a 1 cm ribbon to both eyes at night time   FLUoxetine 20 MG capsule Commonly known as: PROZAC TAKE 3 CAPSULES (60 MG TOTAL) BY MOUTH DAILY   HYDROcodone-acetaminophen 5-325 MG tablet Commonly known as: NORCO/VICODIN TAKE 1 TABLET BY MOUTH EVERY 6 (SIX) HOURS AS NEEDED FOR MODERATE PAIN.    LORazepam 0.5 MG tablet Commonly known as: ATIVAN Take 1 tablet (0.5 mg total) by mouth daily as needed for anxiety.   methimazole 5 MG tablet Commonly known as: TAPAZOLE Take 1.5 tablets (7.5 mg total) by mouth daily.   pantoprazole 40 MG tablet Commonly known as: PROTONIX TAKE 1 TABLET BY MOUTH ONCE A DAY AS DIRECTED   Vitamin D (Ergocalciferol) 1.25 MG (50000 UNIT) Caps capsule Commonly known as: DRISDOL Take 1 capsule (50,000 Units total) by mouth every 7 (seven) days.   Vumerity 231 MG Cpdr Generic drug: Diroximel Fumarate Take 462 mg by mouth 2 (two) times daily.   Vyvanse 60 MG capsule Generic drug: lisdexamfetamine Take 1 capsule (60 mg total) by mouth in the morning.            Past Medical History:  Diagnosis Date   ADD (attention deficit disorder with hyperactivity)    Anxiety    Arthritis    Cancer ([place])    Skin cancer- basal cell, squamous cell   Cervical dysplasia    GERD (gastroesophageal reflux disease)    Headache    migraine   Hepatitis    Pt states Core and surface antibioties for Hepatitis whens he was 18   Mitral valve prolapse    Multiple sclerosis (HCC)    Multiple sclerosis (HCC)    Tendonitis in rigtht wrist     Past Surgical History:  Procedure Laterality Date   ABDOMINAL HYSTERECTOMY  2004   ovaries retained   BACK SURGERY     heart ablation     15 yrs ago for a fib by Dr.Klein, everything ok   KNEE ARTHROSCOPY WITH SUBCHONDROPLASTY Left 11/13/2017   Procedure: LEFT KNEE ARTHROSCOPY WITH SUBCHONDROPLASTY, PARTIAL MEDIAL MENISCECTOMY, SYNOVECTOMY;  Surgeon: Leandrew Koyanagi, MD;  Location: [place];  Service: Orthopedics;  Laterality: Left;   TUBAL LIGATION      Family History  Problem Relation Age of Onset   Depression Mother    Stroke Mother    Heart disease Mother    Dementia Mother    Hypothyroidism Mother    Stroke Father    Heart disease Father    Parkinson's disease Maternal Grandmother    Parkinson's  disease Maternal Grandfather    Heart disease Paternal Grandmother    COPD Paternal Grandfather    Healthy Brother    Cancer Neg  Hx    Diabetes Neg Hx    Breast cancer Neg Hx     Social History:  reports that she has been smoking cigarettes. She has been smoking an average of .25 packs per day. She has never used smokeless tobacco. She reports current alcohol use. She reports that she does not use drugs.  Allergies:  Allergies  Allergen Reactions   Methylprednisolone Hives   Solu-Medrol [Methylprednisolone Sodium Succ] Hives and Itching    Wheezing (was getting with Ocrevus as well)   Cephalexin Hives and Itching   Cephalexin Hives and Itching     Review of Systems     Examination:   BP 116/75   Pulse 75   Ht 5' 7.5" (1.715 m)   Wt 147 lb 12.8 oz (67 kg)   SpO2 96%   BMI 22.81 kg/m   Thyroid just palpable and soft on the right side No tremor And biceps reflexes show normal relaxation Skin is not unusually warm or moist No facial puffiness    Assessment/Plan:   Hyperthyroidism, secondary to Graves' disease   She is on methimazole and now taking 7.5 mg daily  Her thyroid levels have fluctuated again recently between high and low levels even with small changes in her methimazole  Also previously with 7.5 mg she became hypothyroid her levels are now quite normal with this dose except for low normal free T4 Subjectively she is doing well and has normal pulse and blood pressure  Since she may be trending towards mild hypothyroidism we will have her alternate 7.5 with 5 mg of methimazole now, written instructions given  Thyroid surgery: Although her oculoplastic surgeon wants her to have thyroidectomy, today discussed that we need to wait on this since she has required smaller doses of methimazole overall and her thyrotropin receptor antibody has been progressively decreasing this year and may normalize by end of the year    Elayne Snare 02/03/2021, 11:47 AM     Note: This office note was prepared with Dragon voice recognition system technology. Any transcriptional errors that result from this process are unintentional.

## 2021-02-08 ENCOUNTER — Ambulatory Visit: Payer: 59 | Admitting: Neurology

## 2021-02-08 ENCOUNTER — Encounter: Payer: Self-pay | Admitting: Neurology

## 2021-02-08 VITALS — BP 131/80 | HR 75 | Ht 67.5 in | Wt 148.0 lb

## 2021-02-08 DIAGNOSIS — Z79899 Other long term (current) drug therapy: Secondary | ICD-10-CM | POA: Diagnosis not present

## 2021-02-08 DIAGNOSIS — G35 Multiple sclerosis: Secondary | ICD-10-CM

## 2021-02-08 DIAGNOSIS — R32 Unspecified urinary incontinence: Secondary | ICD-10-CM

## 2021-02-08 DIAGNOSIS — E0500 Thyrotoxicosis with diffuse goiter without thyrotoxic crisis or storm: Secondary | ICD-10-CM

## 2021-02-08 DIAGNOSIS — H5789 Other specified disorders of eye and adnexa: Secondary | ICD-10-CM

## 2021-02-08 DIAGNOSIS — E079 Disorder of thyroid, unspecified: Secondary | ICD-10-CM

## 2021-02-08 NOTE — Progress Notes (Signed)
[facility]  PATIENT: [name] DOB: [birth year]     HISTORICAL  CHIEF COMPLAINT:  Chief Complaint  Patient presents with   Follow-up    RM 2, alone. Last seen 08/03/20. On Vumerity for MS. No new Ms concerns.     HISTORY OF PRESENT ILLNESS:  [name] is a 60 y.o.woman with relapsing remitting multiple sclerosis.   Update 02/08/2021: She has Grave's disease (has Thyrotropin receptor Abs).  She is on methimazole and more recently [place] with benefit - less diplopia.   She has needed eyelid surgery.   Recent TSH was in normal range.    Recent Thyrotropin Receptor Ab was 10.40 (was 33 at max).     She is now on Vumerity and tolerates it well   She was allergic to [place] and had multiple skin cancers 9basal and squamous) while on Gilenya (still has had some on other medications).   She has no recent skin lesions.       She feels she is doing well with her MS.   Gait is better since thyroid issues have improved.     She has urinary incontinence..   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    She has extreme urgency with both and can't always get to a facility in time.   This is more of a problem at work so she wears pads.      She has some fatigue.  She sleeps well most nights.  Cognition is fine.    Mood is doing ok on current fluoxetine  Vyvanse helps ADD.      MS history: She was diagnosed with relapsing remitting MS around 2000 after presenting with numbness in the hands and feet.  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 She was admitted to the hospital. She then was diagnosed with MS and is to see Dr. Jacqulynn Cadet at [facility]. He started her on Betaseron.   She did fairly well on Betaseron. She did have several small exacerbations and received some steroids a couple times. Often the exacerbations would be associated with  headache as well. About 4 years ago, she opted to switch to Gilenya to have oral therapy for the MS. She has done very well on that medication without any exacerbations.  Her June 2015 MRI did show one focus that was not present on her MRI from March 2010.  Due to several skin cancers, she switched to Copaxone in 2018 but had breakthrough activity.  She switched to [place] in 2019 but had difficulty tolerating the medication.  She switched to Vumerity in early 2020.  Recent imaging: MRI brain 03/02/2019 showed a "new lesion involving the right superior temporal gyrus consistent with progression of disease.    Increased prominence of lesions in the subcortical white matter of the cingulate gyrus bilaterally, right greater than left. This could impact symptoms in the feet or lower extremities and is also consistent with progression of disease.  Progressive signal change in the left sylvian fissure. No enhancement or restricted diffusion to suggest active demyelination."  MRI of the cervical spine 02/24/2004 showed a normal spinal cord and a disc protrusion to the right at C5-C6 causing right C6 nerve root encroachment.  MRI of the thoracic spine 08/16/2005 showed disc protrusion at T9-T10 and milder protrusions elsewhere but no evidence of demyelination  ALLERGIES: Allergies  Allergen Reactions   Methylprednisolone Hives   Solu-Medrol [Methylprednisolone Sodium Succ] Hives and Itching    Wheezing (was getting with Ocrevus as well)   Cephalexin Hives and Itching   Cephalexin Hives and Itching    HOME MEDICATIONS:  Current Outpatient Medications:    erythromycin ophthalmic ointment, Apply a 1cm ribbon to both eyes at night time, Disp: 3.5 g, Rfl: 8   erythromycin ophthalmic ointment, Appl a 1 cm ribbon to both eyes at night time, Disp: 3.5 g, Rfl: 11   FLUoxetine (PROZAC) 20 MG capsule, TAKE 3 CAPSULES (60 MG TOTAL) BY MOUTH DAILY, Disp: 270 capsule, Rfl: 3   HYDROcodone-acetaminophen  (NORCO/VICODIN) 5-325 MG tablet, TAKE 1 TABLET BY MOUTH EVERY 6 (SIX) HOURS AS NEEDED FOR MODERATE PAIN., Disp: 30 tablet, Rfl: 0   lisdexamfetamine (VYVANSE) 60 MG capsule, Take 1 capsule (60 mg total) by mouth in the morning., Disp: 90 capsule, Rfl: 0   LORazepam (ATIVAN) 0.5 MG tablet, Take 1 tablet (0.5 mg total) by mouth daily as needed for anxiety., Disp: 15 tablet, Rfl: 0   methimazole (TAPAZOLE) 5 MG tablet, Take 1.5 tablets (7.5 mg total) by mouth daily., Disp: 135 tablet, Rfl: 1   Vitamin D, Ergocalciferol, (DRISDOL) 1.25 MG (50000 UNIT) CAPS capsule, Take 1 capsule (50,000 Units total) by mouth every 7 (seven) days., Disp: 12 capsule, Rfl: 1   VUMERITY 231 MG CPDR, Take 462 mg by mouth 2 (two) times daily., Disp: 120 capsule, Rfl: 11   pantoprazole (PROTONIX) 40 MG tablet, TAKE 1 TABLET BY MOUTH ONCE A DAY AS DIRECTED, Disp: 90 tablet, Rfl: 3  PAST MEDICAL HISTORY: Past Medical History:  Diagnosis Date   ADD (attention deficit disorder with hyperactivity)    Anxiety    Arthritis    Cancer ([place])    Skin cancer- basal cell, squamous cell   Cervical dysplasia    GERD (gastroesophageal reflux disease)    Headache    migraine   Hepatitis    Pt states Core and surface antibioties for Hepatitis whens he was 18   Mitral valve prolapse    Multiple sclerosis (HCC)    Multiple sclerosis ([place])    Tendonitis in rigtht wrist     PAST SURGICAL HISTORY: Past Surgical History:  Procedure Laterality Date   ABDOMINAL HYSTERECTOMY  2004   ovaries retained   BACK SURGERY     heart ablation     15 yrs ago for a fib by Dr.Klein, everything ok   KNEE ARTHROSCOPY WITH SUBCHONDROPLASTY Left 11/13/2017   Procedure: LEFT KNEE ARTHROSCOPY WITH SUBCHONDROPLASTY, PARTIAL MEDIAL MENISCECTOMY, SYNOVECTOMY;  Surgeon: Leandrew Koyanagi, MD;  Location: [place];  Service: Orthopedics;  Laterality: Left;   TUBAL LIGATION      FAMILY HISTORY: Family History  Problem Relation Age of Onset    Depression Mother    Stroke Mother    Heart disease Mother    Dementia Mother    Hypothyroidism Mother    Stroke Father    Heart disease Father    Parkinson's disease Maternal Grandmother    Parkinson's disease Maternal Grandfather    Heart disease Paternal Grandmother    COPD Paternal Grandfather    Healthy Brother    Cancer Neg Hx    Diabetes Neg Hx    Breast cancer Neg Hx     SOCIAL HISTORY:  Social History   Socioeconomic History   Marital status: Divorced    Spouse name: Not on file   Number of  children: Not on file   Years of education: Not on file   Highest education level: Not on file  Occupational History   Not on file  Tobacco Use   Smoking status: Some Days    Packs/day: 0.25    Types: Cigarettes   Smokeless tobacco: Never  Vaping Use   Vaping Use: Never used  Substance and Sexual Activity   Alcohol use: Yes    Alcohol/week: 0.0 standard drinks    Comment: Occasioanl glass of wine    Drug use: No   Sexual activity: Not Currently    Birth control/protection: Surgical  Other Topics Concern   Not on file  Social History Narrative   Not on file   Social Determinants of Health   Financial Resource Strain: Not on file  Food Insecurity: Not on file  Transportation Needs: Not on file  Physical Activity: Not on file  Stress: Not on file  Social Connections: Not on file  Intimate Partner Violence: Not on file     PHYSICAL EXAM   BP 131/80 (BP Location: Right Arm, Patient Position: Sitting, Cuff Size: Normal)   Pulse 75   Ht 5' 7.5" (1.715 m)   Wt 148 lb (67.1 kg)   BMI 22.84 kg/m    General: The patient is well-developed and well-nourished and in no acute distress  INeurologic Exam  Mental status: The patient is alert and oriented x 3 at the time of the examination. The patient has apparent normal recent and remote memory, with an apparently normal attention span and concentration ability.   Speech is normal.  Cranial nerves: Extraocular  movements are full.  Color vision was symmetric.  Facial strength is normal.  Trapezius strength is normal.  Hearing is normal.  Motor:  Muscle bulk is normal.  Muscle tone is increased in the legs.  She has reduced strength in the right leg relative to the left  Sensory: Sensory testing is intact to touch and vibration .    Coordination: Cerebellar testing reveals good finger-nose-finger .  Heel-to-shin is reduced in the right leg.  Gait and station: Station is normal.  She is a slight right foot drop.  Does not need support.  Tandem gait is mildly wide.  Romberg is negative.  Reflexes: Deep tendon reflexes are symmetric and normal bilaterally.     DIAGNOSTIC DATA (LABS, IMAGING, TESTING) - I reviewed patient records, labs, notes, testing and imaging myself where available.  Lab Results  Component Value Date   WBC 8.9 12/15/2020   HGB 11.6 12/15/2020   HCT 35.8 12/15/2020   MCV 88 12/15/2020   PLT 264 12/15/2020      Component Value Date/Time   NA 139 12/29/2020 1347   NA 141 12/15/2020 1604   K 4.8 12/29/2020 1347   CL 98 12/29/2020 1347   CO2 33 (H) 12/29/2020 1347   GLUCOSE 129 (H) 12/29/2020 1347   BUN 32 (H) 12/29/2020 1347   BUN 24 12/15/2020 1604   CREATININE 0.94 12/29/2020 1347   CALCIUM 9.8 12/29/2020 1347   PROT 6.5 12/15/2020 1604   ALBUMIN 4.3 12/15/2020 1604   AST 13 12/15/2020 1604   ALT 18 12/15/2020 1604   ALKPHOS 92 12/15/2020 1604   BILITOT <0.2 12/15/2020 1604   GFRNONAA 118 02/03/2020 0921   GFRAA 136 02/03/2020 0921       ASSESSMENT AND PLAN  MS (multiple sclerosis) (HCC)  Thyroid eye disease  High risk medication use  Urinary incontinence, unspecified type  1.   She will continue Vumerity.  Lymphocyte counts were fine May 2022.   2.   Continue to f/u with urology for incontinence. 3.   She will continue to on medications for her thyroid.  She is contemplating surgery.   4.   She will return to see [place] in 6 months or sooner if any  new or worsening neurologic symptoms.   Cahterine Heinzel A. Felecia Shelling, MD, PhD, Charlynn Grimes 1/91/4782, 9:56 PM Certified in Neurology, Clinical Neurophysiology, Sleep Medicine, Pain Medicine and Neuroimaging  [facility] [address], [place], [place] [address] [phone]

## 2021-02-20 DIAGNOSIS — E0500 Thyrotoxicosis with diffuse goiter without thyrotoxic crisis or storm: Secondary | ICD-10-CM | POA: Diagnosis not present

## 2021-02-21 ENCOUNTER — Ambulatory Visit: Payer: Self-pay | Admitting: Surgery

## 2021-02-23 ENCOUNTER — Other Ambulatory Visit: Payer: Self-pay | Admitting: Surgery

## 2021-02-23 DIAGNOSIS — H02534 Eyelid retraction left upper eyelid: Secondary | ICD-10-CM | POA: Diagnosis not present

## 2021-02-23 DIAGNOSIS — H02055 Trichiasis without entropian left lower eyelid: Secondary | ICD-10-CM | POA: Diagnosis not present

## 2021-02-23 DIAGNOSIS — E0500 Thyrotoxicosis with diffuse goiter without thyrotoxic crisis or storm: Secondary | ICD-10-CM | POA: Diagnosis not present

## 2021-02-23 DIAGNOSIS — H02052 Trichiasis without entropian right lower eyelid: Secondary | ICD-10-CM | POA: Diagnosis not present

## 2021-02-23 DIAGNOSIS — E039 Hypothyroidism, unspecified: Secondary | ICD-10-CM

## 2021-02-23 DIAGNOSIS — H532 Diplopia: Secondary | ICD-10-CM | POA: Diagnosis not present

## 2021-02-23 DIAGNOSIS — H02211 Cicatricial lagophthalmos right upper eyelid: Secondary | ICD-10-CM | POA: Diagnosis not present

## 2021-02-23 DIAGNOSIS — H02531 Eyelid retraction right upper eyelid: Secondary | ICD-10-CM | POA: Diagnosis not present

## 2021-02-23 DIAGNOSIS — H05243 Constant exophthalmos, bilateral: Secondary | ICD-10-CM | POA: Diagnosis not present

## 2021-02-23 DIAGNOSIS — H16213 Exposure keratoconjunctivitis, bilateral: Secondary | ICD-10-CM | POA: Diagnosis not present

## 2021-02-24 ENCOUNTER — Other Ambulatory Visit (HOSPITAL_COMMUNITY): Payer: Self-pay | Admitting: Ophthalmology

## 2021-02-24 DIAGNOSIS — H05243 Constant exophthalmos, bilateral: Secondary | ICD-10-CM

## 2021-02-28 ENCOUNTER — Ambulatory Visit (HOSPITAL_COMMUNITY)
Admission: RE | Admit: 2021-02-28 | Discharge: 2021-02-28 | Disposition: A | Discharge status: Home / Self Care | Payer: 59 | Source: Ambulatory Visit | Attending: Ophthalmology | Admitting: Ophthalmology

## 2021-02-28 ENCOUNTER — Other Ambulatory Visit (HOSPITAL_COMMUNITY): Payer: Self-pay | Admitting: Surgery

## 2021-02-28 ENCOUNTER — Ambulatory Visit: Payer: 59

## 2021-02-28 ENCOUNTER — Other Ambulatory Visit: Payer: Self-pay

## 2021-02-28 ENCOUNTER — Other Ambulatory Visit: Payer: Self-pay | Admitting: Surgery

## 2021-02-28 DIAGNOSIS — E0500 Thyrotoxicosis with diffuse goiter without thyrotoxic crisis or storm: Secondary | ICD-10-CM

## 2021-02-28 DIAGNOSIS — M6289 Other specified disorders of muscle: Secondary | ICD-10-CM | POA: Diagnosis not present

## 2021-02-28 DIAGNOSIS — H05243 Constant exophthalmos, bilateral: Secondary | ICD-10-CM | POA: Diagnosis not present

## 2021-02-28 DIAGNOSIS — J3489 Other specified disorders of nose and nasal sinuses: Secondary | ICD-10-CM | POA: Diagnosis not present

## 2021-02-28 DIAGNOSIS — E039 Hypothyroidism, unspecified: Secondary | ICD-10-CM

## 2021-02-28 DIAGNOSIS — H0520 Unspecified exophthalmos: Secondary | ICD-10-CM | POA: Diagnosis not present

## 2021-03-01 ENCOUNTER — Ambulatory Visit (HOSPITAL_COMMUNITY)
Admission: RE | Admit: 2021-03-01 | Discharge: 2021-03-01 | Disposition: A | Discharge status: Home / Self Care | Payer: 59 | Source: Ambulatory Visit | Attending: Surgery | Admitting: Surgery

## 2021-03-01 DIAGNOSIS — E0500 Thyrotoxicosis with diffuse goiter without thyrotoxic crisis or storm: Secondary | ICD-10-CM | POA: Diagnosis not present

## 2021-03-01 DIAGNOSIS — E039 Hypothyroidism, unspecified: Secondary | ICD-10-CM

## 2021-03-01 NOTE — Progress Notes (Signed)
USN consistent with Graves' as expected. No worrisome findings.  [place], MD [facility] [place] practice Office: [phone]

## 2021-03-01 NOTE — Progress Notes (Signed)
Thank you for the results.

## 2021-03-09 ENCOUNTER — Other Ambulatory Visit (HOSPITAL_COMMUNITY): Payer: Self-pay

## 2021-03-09 MED ORDER — PANTOPRAZOLE SODIUM 40 MG PO TBEC
40.0000 mg | DELAYED_RELEASE_TABLET | Freq: Every day | ORAL | 0 refills | Status: DC
  Filled 2021-03-09: qty 90, 90d supply, fill #0

## 2021-03-14 ENCOUNTER — Other Ambulatory Visit (HOSPITAL_COMMUNITY): Payer: Self-pay

## 2021-03-14 ENCOUNTER — Other Ambulatory Visit: Payer: Self-pay

## 2021-03-14 ENCOUNTER — Telehealth (HOSPITAL_BASED_OUTPATIENT_CLINIC_OR_DEPARTMENT_OTHER): Payer: 59 | Admitting: Psychiatry

## 2021-03-14 ENCOUNTER — Encounter (HOSPITAL_COMMUNITY): Payer: Self-pay | Admitting: Psychiatry

## 2021-03-14 VITALS — Wt 148.0 lb

## 2021-03-14 DIAGNOSIS — F330 Major depressive disorder, recurrent, mild: Secondary | ICD-10-CM

## 2021-03-14 DIAGNOSIS — F419 Anxiety disorder, unspecified: Secondary | ICD-10-CM | POA: Diagnosis not present

## 2021-03-14 DIAGNOSIS — F900 Attention-deficit hyperactivity disorder, predominantly inattentive type: Secondary | ICD-10-CM

## 2021-03-14 MED ORDER — LISDEXAMFETAMINE DIMESYLATE 60 MG PO CAPS
60.0000 mg | ORAL_CAPSULE | Freq: Every morning | ORAL | 0 refills | Status: DC
  Filled 2021-03-14 – 2021-04-25 (×2): qty 90, 90d supply, fill #0

## 2021-03-14 MED ORDER — LORAZEPAM 0.5 MG PO TABS
0.5000 mg | ORAL_TABLET | Freq: Every day | ORAL | 0 refills | Status: DC | PRN
  Filled 2021-03-14: qty 15, 15d supply, fill #0

## 2021-03-14 NOTE — Progress Notes (Signed)
Virtual Visit via Telephone Note  I connected with [name] on 03/14/21 at  3:40 PM EDT by telephone and verified that I am speaking with the correct person using two identifiers.  Location: Patient: Home Provider: Home Office   I discussed the limitations, risks, security and privacy concerns of performing an evaluation and management service by telephone and the availability of in person appointments. I also discussed with the patient that there may be a patient responsible charge related to this service. The patient expressed understanding and agreed to proceed.   History of Present Illness: Patient is evaluated by phone session.  She admitted some anxiety because of upcoming thyroid surgery.  She is scheduled next month but her physician told even after the surgery her vision may not get better.  Patient has diplopia and she is very scared to drive after 5 PM because of the darkness.  Now the weather is changing she is more anxious and nervous.  She has not taken the Ativan in a while but like to have a refill in case she needed.  Overall she described her attention, focus is good.  She is able to do multitasking and there has no issue at work other than that work is busy.  She did some time thinking about her physical health and get restless night but able to sleep 7 to 8 hours.  Her appetite is okay.  Her weight is stable.  She has no tremors, shakes or any EPS.  Her son is getting married and she has to drive to [place] and after the wedding she may has to stay overnight because she does not want to drive back in dark.  She is getting Prozac from her neurologist.  She is happy her MS is stable.    Past Psychiatric History:  H/O depression and ADD.  Tried Zoloft, Focalin, Ritalin and Strattera.  No history of inpatient or any suicidal attempt.   Psychiatric Specialty Exam: Physical Exam  Review of Systems  Weight 148 lb (67.1 kg).There is no height or weight on file to calculate BMI.   General Appearance: NA  Eye Contact:  NA  Speech:  Clear and Coherent and Normal Rate  Volume:  Normal  Mood:  Anxious  Affect:  NA  Thought Process:  Coherent  Orientation:  Full (Time, Place, and Person)  Thought Content:  Logical  Suicidal Thoughts:  No  Homicidal Thoughts:  No  Memory:  Immediate;   Good Recent;   Good Remote;   Good  Judgement:  Intact  Insight:  Present  Psychomotor Activity:  NA  Concentration:  Concentration: Good and Attention Span: Good  Recall:  Good  Fund of Knowledge:  Good  Language:  Good  Akathisia:  No  Handed:  Right  AIMS (if indicated):     Assets:  Communication Skills Desire for Improvement Housing Resilience Social Support Talents/Skills Transportation  ADL's:  Intact  Cognition:  WNL  Sleep:         Assessment and Plan: Major depressive disorder, recurrent.  Anxiety.  Attention deficit disorder, inattentive type.  Patient has upcoming thyroid surgery and she is anxious even after the surgery her diplopia may still persist.  Discuss anxiety.  Patient has taken in the past Ativan with good response but does not had any new refills.  We will provide Ativan 0.5 mg to take as needed if needed for anxiety.  Continue Vyvanse 60 mg daily.  She is getting Prozac from her neurologist.  Recommended to call us back if she is any question or any concern.  Follow-up in 3 months.  Follow Up Instructions:    I discussed the assessment and treatment plan with the patient. The patient was provided an opportunity to ask questions and all were answered. The patient agreed with the plan and demonstrated an understanding of the instructions.   The patient was advised to call back or seek an in-person evaluation if the symptoms worsen or if the condition fails to improve as anticipated.  I provided 19 minutes of non-face-to-face time during this encounter.   Kathlee Nations, MD

## 2021-03-21 ENCOUNTER — Other Ambulatory Visit (INDEPENDENT_AMBULATORY_CARE_PROVIDER_SITE_OTHER): Payer: 59

## 2021-03-21 ENCOUNTER — Other Ambulatory Visit: Payer: Self-pay

## 2021-03-21 DIAGNOSIS — E0590 Thyrotoxicosis, unspecified without thyrotoxic crisis or storm: Secondary | ICD-10-CM | POA: Diagnosis not present

## 2021-03-21 LAB — T3, FREE: T3, Free: 3.5 pg/mL (ref 2.3–4.2)

## 2021-03-21 LAB — T4, FREE: Free T4: 1.22 ng/dL (ref 0.60–1.60)

## 2021-03-21 LAB — TSH: TSH: 0.02 u[IU]/mL — ABNORMAL LOW (ref 0.35–5.50)

## 2021-03-22 LAB — THYROTROPIN RECEPTOR AUTOABS: Thyrotropin Receptor Ab: 9.16 IU/L — ABNORMAL HIGH (ref 0.00–1.75)

## 2021-03-23 ENCOUNTER — Encounter: Payer: Self-pay | Admitting: Endocrinology

## 2021-03-23 ENCOUNTER — Other Ambulatory Visit: Payer: Self-pay

## 2021-03-23 ENCOUNTER — Ambulatory Visit: Payer: 59 | Admitting: Endocrinology

## 2021-03-23 VITALS — BP 118/78 | HR 78 | Ht 67.5 in | Wt 147.2 lb

## 2021-03-23 DIAGNOSIS — E0590 Thyrotoxicosis, unspecified without thyrotoxic crisis or storm: Secondary | ICD-10-CM | POA: Diagnosis not present

## 2021-03-23 NOTE — Progress Notes (Signed)
Patient ID: [name], female   DOB: January 19, 1961, 60 y.o.   MRN: [id number]                                                                                                               Reason for Appointment:  Hyperthyroidism, follow-up visit   Chief complaint: Follow-up   History of Present Illness:   Baseline history: 4 weeks prior to her initial visit she had symptoms of palpitations, dizziness, feeling excessively warm/sweaty, shakiness of her legs, and weight loss The patient had last 15 about  lbs since these symptoms started Apparently she had gastroenteritis-like symptoms for about 5 days right after her Covid booster shot on 01/20/2020 also associated with significant malaise and weakness She was also feeling like her heart was racing and her breathing was difficult Despite her normal appetite she has lost weight  Recent history:  Initially for treatment she was started on methimazole 10 mg twice daily With this she had resolution of her symptoms of palpitations, breathing difficulty, feeling warm and shakiness  On her visit in 11/21 even though she was not complaining of unusual fatigue she was hypothyroid with free T4 only 0.43 She was told to reduce her dose to 7.5 mg methimazole daily  Her methimazole dose subsequently has been quite variable with her thyroid levels fluctuating between hyperthyroidism and hypothyroidism  She is now taking 7.5 mg alternating with 5 mg  The dose was changed in 9/22 She feels fairly good overall, not unusually fatigued, no heat or cold intolerance, no palpitations or shakiness Weight is about the same She thinks she is taking her methimazole dose as above  She has seen the thyroid surgeon but since her thyroid levels are improving in September surgery has been deferred  Labs: Thyroid levels show her free T4 is not as low and free T3 still normal However TSH is suppressed now compared to 1.6   Thyrotropin receptor antibody has  only improved by about one-point and still significantly high at 9.2   Ophthalmopathy: She has had blurring of her vision initially improved with a course of Tepezza infusions Double vision is better   Wt Readings from Last 3 Encounters:  03/23/21 147 lb 3.2 oz (66.8 kg)  02/08/21 148 lb (67.1 kg)  02/03/21 147 lb 12.8 oz (67 kg)    02/03/2020: Total T4 = 12.2  and total T3 321, high  Lab Results  Component Value Date   FREET4 1.22 03/21/2021   FREET4 0.71 01/31/2021   FREET4 1.12 12/29/2020   T3FREE 3.5 03/21/2021   T3FREE 3.0 01/31/2021   T3FREE 4.3 (H) 12/29/2020   TSH 0.02 Repeated and verified X2. (L) 03/21/2021   TSH 1.58 01/31/2021   TSH 0.43 12/29/2020    Lab Results  Component Value Date   THYROTRECAB 9.16 (H) 03/21/2021   THYROTRECAB 10.40 (H) 12/21/2020   THYROTRECAB 17.80 (H) 09/23/2020     Allergies as of 03/23/2021       Reactions   Methylprednisolone Hives   Solu-medrol [  methylprednisolone Sodium Succ] Hives, Itching   Wheezing (was getting with Ocrevus as well)   Cephalexin Hives, Itching   Cephalexin Hives, Itching        Medication List        Accurate as of March 23, 2021  2:12 PM. If you have any questions, ask your nurse or doctor.          erythromycin ophthalmic ointment Apply a 1cm ribbon to both eyes at night time   erythromycin ophthalmic ointment Appl a 1 cm ribbon to both eyes at night time   FLUoxetine 20 MG capsule Commonly known as: PROZAC TAKE 3 CAPSULES (60 MG TOTAL) BY MOUTH DAILY   lisdexamfetamine 60 MG capsule Commonly known as: VYVANSE Take 1 capsule (60 mg total) by mouth in the morning.   LORazepam 0.5 MG tablet Commonly known as: ATIVAN Take 1 tablet (0.5 mg total) by mouth daily as needed for anxiety.   methimazole 5 MG tablet Commonly known as: TAPAZOLE Take 1.5 tablets (7.5 mg total) by mouth daily.   pantoprazole 40 MG tablet Commonly known as: PROTONIX TAKE 1 TABLET BY MOUTH ONCE A DAY AS  DIRECTED   pantoprazole 40 MG tablet Commonly known as: PROTONIX Take 1 tablet (40 mg total) by mouth daily. Must have yearly follow up for refills.   Vitamin D (Ergocalciferol) 1.25 MG (50000 UNIT) Caps capsule Commonly known as: DRISDOL Take 1 capsule (50,000 Units total) by mouth every 7 (seven) days.   Vumerity 231 MG Cpdr Generic drug: Diroximel Fumarate Take 462 mg by mouth 2 (two) times daily.            Past Medical History:  Diagnosis Date   ADD (attention deficit disorder with hyperactivity)    Anxiety    Arthritis    Cancer ([place])    Skin cancer- basal cell, squamous cell   Cervical dysplasia    GERD (gastroesophageal reflux disease)    Headache    migraine   Hepatitis    Pt states Core and surface antibioties for Hepatitis whens he was 18   Mitral valve prolapse    Multiple sclerosis ([facility])    Multiple sclerosis ([facility])    Tendonitis in rigtht wrist     Past Surgical History:  Procedure Laterality Date   ABDOMINAL HYSTERECTOMY  2004   ovaries retained   BACK SURGERY     heart ablation     15 yrs ago for a fib by Dr.Klein, everything ok   KNEE ARTHROSCOPY WITH SUBCHONDROPLASTY Left 11/13/2017   Procedure: LEFT KNEE ARTHROSCOPY WITH SUBCHONDROPLASTY, PARTIAL MEDIAL MENISCECTOMY, SYNOVECTOMY;  Surgeon: Leandrew Koyanagi, MD;  Location: [facility];  Service: Orthopedics;  Laterality: Left;   TUBAL LIGATION      Family History  Problem Relation Age of Onset   Depression Mother    Stroke Mother    Heart disease Mother    Dementia Mother    Hypothyroidism Mother    Stroke Father    Heart disease Father    Parkinson's disease Maternal Grandmother    Parkinson's disease Maternal Grandfather    Heart disease Paternal Grandmother    COPD Paternal Grandfather    Healthy Brother    Cancer Neg Hx    Diabetes Neg Hx    Breast cancer Neg Hx     Social History:  reports that she has been smoking cigarettes. She has been smoking an average of .25  packs per day. She has never used smokeless tobacco. She  reports current alcohol use. She reports that she does not use drugs.  Allergies:  Allergies  Allergen Reactions   Methylprednisolone Hives   Solu-Medrol [Methylprednisolone Sodium Succ] Hives and Itching    Wheezing (was getting with Ocrevus as well)   Cephalexin Hives and Itching   Cephalexin Hives and Itching     Review of Systems     Examination:   BP 118/78 (BP Location: Left Arm, Patient Position: Sitting, Cuff Size: Normal)   Pulse 78   Ht 5' 7.5" (1.715 m)   Wt 147 lb 3.2 oz (66.8 kg)   SpO2 99%   BMI 22.71 kg/m   Thyroid not palpable  No tremor Right biceps reflexes show slightly brisk relaxation Skin is not unusually warm or moist     Assessment/Plan:   Hyperthyroidism, secondary to Graves' disease   She is on methimazole and now taking 7.5 mg alternating with 5 mg She thinks she is taking the dose as prescribed although initially she was saying that she is taking only half a tablet alternating with 1-1/2 Even with minimal changes in her thyroid medication her free T4 as gone up to 1.2 which is higher than usual maintenance level  Her thyrotropin receptor antibody has not improved much and this likely indicates that she is not likely to be in remission soon We will continue her methimazole unchanged for now but refer her back for surgery for thyroidectomy  Explained to her that she will be on levothyroxine at the time of discharge and recommended dose for her would be 112 mcg daily   Elayne Snare 03/23/2021, 2:12 PM    Note: This office note was prepared with Dragon voice recognition system technology. Any transcriptional errors that result from this process are unintentional.

## 2021-03-29 NOTE — Progress Notes (Signed)
COVID swab appointment: 04/04/21  COVID Vaccine Completed: yes x3 Date COVID Vaccine completed: 05/23/19, 06/16/19 Has received booster: 01/21/20 COVID vaccine manufacturer: [place]   Date of COVID positive in last 90 days:  PCP - Bary Castilla, NP Cardiologist -   Chest x-ray -  EKG -  Stress Test -  ECHO -  Cardiac Cath -  Pacemaker/ICD device last checked: Spinal Cord Stimulator:  Sleep Study -  CPAP -   Fasting Blood Sugar -  Checks Blood Sugar _____ times a day  Blood Thinner Instructions: Aspirin Instructions: Last Dose:  Activity level:  Can go up a flight of stairs and perform activities of daily living without stopping and without symptoms of chest pain or shortness of breath.   Able to exercise without symptoms  Unable to go up a flight of stairs without symptoms of      Anesthesia review:   Patient denies shortness of breath, fever, cough and chest pain at PAT appointment   Patient verbalized understanding of instructions that were given to them at the PAT appointment. Patient was also instructed that they will need to review over the PAT instructions again at home before surgery.

## 2021-03-30 ENCOUNTER — Encounter (HOSPITAL_COMMUNITY)
Admission: RE | Admit: 2021-03-30 | Discharge: 2021-03-30 | Disposition: A | Discharge status: Home / Self Care | Payer: 59 | Source: Ambulatory Visit | Attending: Surgery | Admitting: Surgery

## 2021-03-30 ENCOUNTER — Encounter (HOSPITAL_COMMUNITY): Payer: Self-pay

## 2021-03-30 ENCOUNTER — Ambulatory Visit (HOSPITAL_COMMUNITY)
Admission: RE | Admit: 2021-03-30 | Discharge: 2021-03-30 | Disposition: A | Discharge status: Home / Self Care | Payer: 59 | Source: Ambulatory Visit | Attending: Anesthesiology | Admitting: Anesthesiology

## 2021-03-30 ENCOUNTER — Encounter (HOSPITAL_COMMUNITY): Payer: Self-pay | Admitting: Surgery

## 2021-03-30 VITALS — BP 121/72 | HR 88 | Temp 98.0°F | Resp 18 | Ht 67.5 in | Wt 144.6 lb

## 2021-03-30 DIAGNOSIS — G35 Multiple sclerosis: Secondary | ICD-10-CM | POA: Insufficient documentation

## 2021-03-30 DIAGNOSIS — Z01818 Encounter for other preprocedural examination: Secondary | ICD-10-CM | POA: Diagnosis not present

## 2021-03-30 DIAGNOSIS — I2510 Atherosclerotic heart disease of native coronary artery without angina pectoris: Secondary | ICD-10-CM | POA: Diagnosis not present

## 2021-03-30 HISTORY — DX: Pneumonia, unspecified organism: J18.9

## 2021-03-30 HISTORY — DX: Family history of other specified conditions: Z84.89

## 2021-03-30 HISTORY — DX: Thyrotoxicosis, unspecified without thyrotoxic crisis or storm: E05.90

## 2021-03-30 LAB — CBC
HCT: 36.6 % (ref 36.0–46.0)
Hemoglobin: 11.6 g/dL — ABNORMAL LOW (ref 12.0–15.0)
MCH: 29.5 pg (ref 26.0–34.0)
MCHC: 31.7 g/dL (ref 30.0–36.0)
MCV: 93.1 fL (ref 80.0–100.0)
Platelets: 287 10*3/uL (ref 150–400)
RBC: 3.93 MIL/uL (ref 3.87–5.11)
RDW: 13.1 % (ref 11.5–15.5)
WBC: 8.5 10*3/uL (ref 4.0–10.5)
nRBC: 0 % (ref 0.0–0.2)

## 2021-03-30 LAB — BASIC METABOLIC PANEL
Anion gap: 7 (ref 5–15)
BUN: 22 mg/dL — ABNORMAL HIGH (ref 6–20)
CO2: 30 mmol/L (ref 22–32)
Calcium: 9.1 mg/dL (ref 8.9–10.3)
Chloride: 103 mmol/L (ref 98–111)
Creatinine, Ser: 0.45 mg/dL (ref 0.44–1.00)
GFR, Estimated: >60 mL/min (ref >60)
Glucose, Bld: 113 mg/dL — ABNORMAL HIGH (ref 70–99)
Potassium: 4.2 mmol/L (ref 3.5–5.1)
Sodium: 140 mmol/L (ref 135–145)

## 2021-03-30 NOTE — Patient Instructions (Addendum)
DUE TO COVID-19 ONLY ONE VISITOR IS ALLOWED TO COME WITH YOU AND STAY IN THE WAITING ROOM ONLY DURING PRE OP AND PROCEDURE.   **NO VISITORS ARE ALLOWED IN THE SHORT STAY AREA OR RECOVERY ROOM!!**  IF YOU WILL BE ADMITTED INTO THE HOSPITAL YOU ARE ALLOWED ONLY TWO SUPPORT PEOPLE DURING VISITATION HOURS ONLY (7AM -8PM)   The support person(s) may change daily. The support person(s) must pass our screening, gel in and out, and wear a mask at all times, including in the patient's room. Patients must also wear a mask when staff or their support person are in the room.  No visitors under the age of 71. Any visitor under the age of 65 must be accompanied by an adult.    COVID SWAB TESTING MUST BE COMPLETED ON:  04/04/21 **MUST PRESENT COMPLETED FORM AT TESTING SITE**    [place] (backside of the building) No appointment needed. Open 8am-3pm.  You are not required to quarantine, however you are required to wear a well-fitted mask when you are out and around people not in your household.  Hand Hygiene often Do NOT share personal items Notify your provider if you are in close contact with someone who has COVID or you develop fever 100.4 or greater, new onset of sneezing, cough, sore throat, shortness of breath or body aches.       Your procedure is scheduled on: 04/06/21   Report to [facility] Main Entrance    Report to admitting at 6:15 AM   Call this number if you have problems the morning of surgery [phone]   Do not eat food :After Midnight.   May have liquids until 5:30 AM day of surgery  CLEAR LIQUID DIET  Foods Allowed                                                                     Foods Excluded  Water, Black Coffee and tea (no milk or creamer)           liquids that you cannot  Plain Jell-O in any flavor  (No red)                                    see through such as: Fruit ices (not with fruit pulp)                                             milk, soups, orange juice              Iced Popsicles (No red)                                                All solid food  Apple juices  Sports drinks like Gatorade (No red) Lightly seasoned clear broth or consume(fat free) Sugar   Oral Hygiene is also important to reduce your risk of infection.                                    Remember - BRUSH YOUR TEETH THE MORNING OF SURGERY WITH YOUR REGULAR TOOTHPASTE   Take these medicines the morning of surgery with A SIP OF WATER: Prozac, Norco, Ativan, Protonix                              You may not have any metal on your body including hair pins, jewelry, and body piercing             Do not wear make-up, lotions, powders, perfumes, or deodorant  Do not wear nail polish including gel and S&S, artificial/acrylic nails, or any other type of covering on natural nails including finger and toenails. If you have artificial nails, gel coating, etc. that needs to be removed by a nail salon please have this removed prior to surgery or surgery may need to be canceled/ delayed if the surgeon/ anesthesia feels like they are unable to be safely monitored.   Do not shave  48 hours prior to surgery.    Do not bring valuables to the hospital. [place].   Contacts, dentures or bridgework may not be worn into surgery.   Bring small overnight bag day of surgery.  Special Instructions: Bring a copy of your healthcare power of attorney and living will documents         the day of surgery if you haven't scanned them before.   Please read over the following fact sheets you were given: IF YOU HAVE QUESTIONS ABOUT YOUR PRE-OP INSTRUCTIONS PLEASE CALL [place] [facility] - Preparing for Surgery Before surgery, you can play an important role.  Because skin is not sterile, your skin needs to be as free of germs as possible.  You can reduce the number of germs  on your skin by washing with CHG (chlorahexidine gluconate) soap before surgery.  CHG is an antiseptic cleaner which kills germs and bonds with the skin to continue killing germs even after washing. Please DO NOT use if you have an allergy to CHG or antibacterial soaps.  If your skin becomes reddened/irritated stop using the CHG and inform your nurse when you arrive at Short Stay. Do not shave (including legs and underarms) for at least 48 hours prior to the first CHG shower.  You may shave your face/neck.  Please follow these instructions carefully:  1.  Shower with CHG Soap the night before surgery and the  morning of surgery.  2.  If you choose to wash your hair, wash your hair first as usual with your normal  shampoo.  3.  After you shampoo, rinse your hair and body thoroughly to remove the shampoo.                             4.  Use CHG as you would any other liquid soap.  You can apply chg directly to the skin and wash.  Gently with a  scrungie or clean washcloth.  5.  Apply the CHG Soap to your body ONLY FROM THE NECK DOWN.   Do   not use on face/ open                           Wound or open sores. Avoid contact with eyes, ears mouth and   genitals (private parts).                       Wash face,  Genitals (private parts) with your normal soap.             6.  Wash thoroughly, paying special attention to the area where your    surgery  will be performed.  7.  Thoroughly rinse your body with warm water from the neck down.  8.  DO NOT shower/wash with your normal soap after using and rinsing off the CHG Soap.                9.  Pat yourself dry with a clean towel.            10.  Wear clean pajamas.            11.  Place clean sheets on your bed the night of your first shower and do not  sleep with pets. Day of Surgery : Do not apply any lotions/deodorants the morning of surgery.  Please wear clean clothes to the hospital/surgery center.  FAILURE TO FOLLOW THESE INSTRUCTIONS MAY RESULT IN  THE CANCELLATION OF YOUR SURGERY  PATIENT SIGNATURE_________________________________  NURSE SIGNATURE__________________________________  ________________________________________________________________________

## 2021-03-30 NOTE — Progress Notes (Addendum)
COVID swab appointment: 04/04/21   COVID Vaccine Completed: yes x3 Date COVID Vaccine completed: 05/23/19, 06/16/19 Has received booster: 01/21/20 COVID vaccine manufacturer: [place]    Date of COVID positive in last 90 days: no   PCP - Bary Castilla, NP Cardiologist - Dr. Meda Coffee   Chest x-ray - 03/30/21 Epic EKG - 04/08/20 Epic Stress Test - n/a ECHO - 04/08/20 Epic Cardiac Cath - n/a Pacemaker/ICD device last checked: n/a Spinal Cord Stimulator: n/a   Sleep Study - n/a CPAP -    Fasting Blood Sugar - n/a Checks Blood Sugar _____ times a day   Blood Thinner Instructions: n/a Aspirin Instructions: Last Dose:   Activity level:   Can go up a flight of stairs and perform activities of daily living without stopping and without symptoms of chest pain or shortness of breath.                           Anesthesia review: MS, MVP, dyspnea post COVID 3/22, mother had Anesthesia complications due to severe mitral valve regurgiation   Patient denies shortness of breath, fever, cough and chest pain at PAT appointment     Patient verbalized understanding of instructions that were given to them at the PAT appointment. Patient was also instructed that they will need to review over the PAT instructions again at home before surgery.

## 2021-03-30 NOTE — H&P (Signed)
REFERRING PHYSICIAN: Elayne Snare, MD  PROVIDER: Pride Gonzales Charlotta Newton, MD  Chief Complaint: New Consultation (Hyperthyroidism / Berenice Primas' disease)  History of Present Illness:  Patient is referred by Dr. Elayne Snare for surgical evaluation and management of Graves' disease with ophthalmologic complications. Patient's ophthalmologist is Dr. Nadara Mustard. Her neurologist is Dr. Algie Coffer. Patient does have multiple sclerosis. However, after receiving her booster dosage of COVID vaccine in September 2021, the patient developed a variety of symptoms related to hyperthyroidism. She has been treated with methimazole. She developed ophthalmologic complications including double vision. She is now referred for consideration for total thyroidectomy for management of Graves' disease. Patient has had no prior history of thyroid disease. She has had no thyroid imaging. She has never been on thyroid medication. There is a family history of hypothyroidism in the patient's mother. There is no family history of thyroid malignancy or other endocrine neoplasms. Patient is well-known to me as she works in the radiology department at [employer]: A complete review of systems was obtained from the patient. I have reviewed this information and discussed as appropriate with the patient. See HPI as well for other ROS.  Review of Systems  Constitutional: Positive for weight loss.  HENT: Negative.  Eyes: Positive for blurred vision and double vision.  Respiratory: Negative.  Cardiovascular: Positive for palpitations.  Gastrointestinal: Negative.  Genitourinary: Negative.  Musculoskeletal: Positive for falls.  Skin: Negative.  Neurological: Positive for dizziness.  Endo/Heme/Allergies: Negative.  Psychiatric/Behavioral: Negative.    Medical History: Past Medical History:  Diagnosis Date   Arthritis   GERD (gastroesophageal reflux disease)   Heart valve disease   Thyroid disease    Patient Active Problem List  Diagnosis   Graves' disease   Past Surgical History:  Procedure Laterality Date   HYSTERECTOMY   lumbar diskectomy N/A   right knee torn meniscus N/A    Allergies  Allergen Reactions   Methylprednisolone Hives   Methylprednisolone Sodium Succ Hives and Itching  Wheezing (was getting with Ocrevus as well)   Cephalexin Hives and Itching   Current Outpatient Medications on File Prior to Visit  Medication Sig Dispense Refill   diroximel fumarate (VUMERITY) 231 mg CpDR Vumerity 231 mg capsule,delayed release   FLUoxetine (PROZAC) 20 MG capsule fluoxetine 20 mg capsule TAKE 3 CAPSULES BY MOUTH DAILY   lisdexamfetamine (VYVANSE) 60 MG capsule Vyvanse 60 mg capsule TAKE 1 CAPSULE (60 MG TOTAL) BY MOUTH EVERY MORNING.   pantoprazole (PROTONIX) 40 MG DR tablet pantoprazole 40 mg tablet,delayed release TAKE 1 TABLET BY MOUTH ONCE A DAY AS DIRECTED   methIMAzole (TAPAZOLE) 5 MG tablet   No current facility-administered medications on file prior to visit.   Family History  Problem Relation Age of Onset   Stroke Mother   Stroke Father    Social History   Tobacco Use  Smoking Status Current Every Day Smoker  Smokeless Tobacco Never Used    Social History   Socioeconomic History   Marital status: Divorced  Tobacco Use   Smoking status: Current Every Day Smoker   Smokeless tobacco: Never Used  Substance and Sexual Activity   Alcohol use: Yes   Drug use: Never   Objective:   Vitals:  BP: 102/68  Pulse: (!) 117  Temp: 36.9 C (98.4 F)  SpO2: 96%  Weight: 66.1 kg (145 lb 12.8 oz)  Height: 171.5 cm (5' 7.5")   Body mass index is 22.5 kg/m.  Physical Exam  GENERAL APPEARANCE Development: normal Nutritional status: normal Gross deformities: none  SKIN Rash, lesions, ulcers: none Induration, erythema: none Nodules: none palpable  EYES Conjunctiva and lids: Asymmetrical Pupils: equal and reactive Iris: normal  bilaterally  EARS, NOSE, MOUTH, THROAT External ears: no lesion or deformity External nose: no lesion or deformity Hearing: grossly normal Due to Covid-19 pandemic, patient is wearing a mask.  NECK Symmetric: yes Trachea: midline Thyroid: no palpable nodules in the thyroid bed, although the thyroid is moderately firm to palpation and somewhat nodular. No associated lymphadenopathy.  CHEST Respiratory effort: normal Retraction or accessory muscle use: no Breath sounds: normal bilaterally Rales, rhonchi, wheeze: none  CARDIOVASCULAR Auscultation: regular rhythm, normal rate Murmurs: none Pulses: radial pulse 2+ palpable Lower extremity edema: none  MUSCULOSKELETAL Station and gait: normal Digits and nails: no clubbing or cyanosis Muscle strength: grossly normal all extremities Range of motion: grossly normal all extremities Deformity: none  LYMPHATIC Cervical: none palpable Supraclavicular: none palpable  PSYCHIATRIC Oriented to person, place, and time: yes Mood and affect: normal for situation Judgment and insight: appropriate for situation  Assessment and Plan:   Graves' disease   Patient is referred by her endocrinologist, Dr. Elayne Snare, for surgical management of Graves' disease with ophthalmologic complications.  Today we discussed total thyroidectomy as the treatment of choice. We discussed the risk and benefits of the procedure including the risk of recurrent laryngeal nerve injury and injury to parathyroid glands. We discussed the size and location of the surgical incision. We discussed the hospital stay to be anticipated. We discussed the postoperative recovery and return to work and activities. We discussed the need for lifelong thyroid hormone replacement.  I would like to obtain an ultrasound examination of the thyroid prior to surgery. The patient understands and agrees with the above plan.  The risks and benefits of the procedure have been discussed at  length with the patient. The patient understands the proposed procedure, potential alternative treatments, and the course of recovery to be expected. All of the patient's questions have been answered at this time. The patient wishes to proceed with surgery.   Armandina Gemma, MD [facility] [place] Office: [phone]

## 2021-04-04 ENCOUNTER — Other Ambulatory Visit: Payer: Self-pay | Admitting: Surgery

## 2021-04-04 LAB — SARS CORONAVIRUS 2 (TAT 6-24 HRS): SARS Coronavirus 2: NEGATIVE

## 2021-04-06 ENCOUNTER — Ambulatory Visit (HOSPITAL_COMMUNITY): Payer: 59 | Admitting: Certified Registered Nurse Anesthetist

## 2021-04-06 ENCOUNTER — Encounter (HOSPITAL_COMMUNITY): Payer: Self-pay | Admitting: Surgery

## 2021-04-06 ENCOUNTER — Encounter (HOSPITAL_COMMUNITY)
Admission: RE | Disposition: A | Discharge status: Home / Self Care | Payer: Self-pay | Source: Home / Self Care | Attending: Surgery

## 2021-04-06 ENCOUNTER — Ambulatory Visit (HOSPITAL_COMMUNITY): Payer: 59 | Admitting: Physician Assistant

## 2021-04-06 ENCOUNTER — Ambulatory Visit (HOSPITAL_COMMUNITY)
Admission: RE | Admit: 2021-04-06 | Discharge: 2021-04-07 | Disposition: A | Discharge status: Home / Self Care | Payer: 59 | Attending: Surgery | Admitting: Surgery

## 2021-04-06 DIAGNOSIS — M1990 Unspecified osteoarthritis, unspecified site: Secondary | ICD-10-CM | POA: Insufficient documentation

## 2021-04-06 DIAGNOSIS — E079 Disorder of thyroid, unspecified: Secondary | ICD-10-CM | POA: Diagnosis present

## 2021-04-06 DIAGNOSIS — E042 Nontoxic multinodular goiter: Secondary | ICD-10-CM | POA: Diagnosis not present

## 2021-04-06 DIAGNOSIS — I4891 Unspecified atrial fibrillation: Secondary | ICD-10-CM | POA: Insufficient documentation

## 2021-04-06 DIAGNOSIS — H5789 Other specified disorders of eye and adnexa: Secondary | ICD-10-CM | POA: Diagnosis present

## 2021-04-06 DIAGNOSIS — I341 Nonrheumatic mitral (valve) prolapse: Secondary | ICD-10-CM | POA: Diagnosis not present

## 2021-04-06 DIAGNOSIS — E0500 Thyrotoxicosis with diffuse goiter without thyrotoxic crisis or storm: Secondary | ICD-10-CM | POA: Diagnosis present

## 2021-04-06 DIAGNOSIS — G35 Multiple sclerosis: Secondary | ICD-10-CM | POA: Insufficient documentation

## 2021-04-06 DIAGNOSIS — E049 Nontoxic goiter, unspecified: Secondary | ICD-10-CM | POA: Diagnosis not present

## 2021-04-06 DIAGNOSIS — J309 Allergic rhinitis, unspecified: Secondary | ICD-10-CM | POA: Diagnosis not present

## 2021-04-06 DIAGNOSIS — K219 Gastro-esophageal reflux disease without esophagitis: Secondary | ICD-10-CM | POA: Insufficient documentation

## 2021-04-06 DIAGNOSIS — E0590 Thyrotoxicosis, unspecified without thyrotoxic crisis or storm: Secondary | ICD-10-CM | POA: Diagnosis present

## 2021-04-06 DIAGNOSIS — F17210 Nicotine dependence, cigarettes, uncomplicated: Secondary | ICD-10-CM | POA: Insufficient documentation

## 2021-04-06 HISTORY — PX: THYROIDECTOMY: SHX17

## 2021-04-06 SURGERY — THYROIDECTOMY
Anesthesia: General

## 2021-04-06 MED ORDER — HYDROMORPHONE HCL 1 MG/ML IJ SOLN: 1.0000 mg | INTRAMUSCULAR | Status: DC | PRN

## 2021-04-06 MED ORDER — MEPERIDINE HCL 50 MG/ML IJ SOLN: 6.2500 mg | INTRAMUSCULAR | Status: DC | PRN

## 2021-04-06 MED ORDER — CHLORHEXIDINE GLUCONATE 0.12 % MT SOLN
15.0000 mL | Freq: Once | OROMUCOSAL | Status: AC
  Administered 2021-04-06: 07:00:00 15 mL via OROMUCOSAL

## 2021-04-06 MED ORDER — ONDANSETRON HCL 4 MG/2ML IJ SOLN
INTRAMUSCULAR | Status: DC | PRN
  Administered 2021-04-06: 4 mg via INTRAVENOUS

## 2021-04-06 MED ORDER — PROPOFOL 10 MG/ML IV BOLUS
INTRAVENOUS | Status: DC | PRN
  Administered 2021-04-06: 150 mg via INTRAVENOUS

## 2021-04-06 MED ORDER — TRAMADOL HCL 50 MG PO TABS
50.0000 mg | ORAL_TABLET | Freq: Four times a day (QID) | ORAL | Status: DC | PRN
  Administered 2021-04-06: 21:00:00 50 mg via ORAL
  Filled 2021-04-06: qty 1

## 2021-04-06 MED ORDER — OXYCODONE HCL 5 MG/5ML PO SOLN: 5.0000 mg | Freq: Once | ORAL | Status: DC | PRN

## 2021-04-06 MED ORDER — ROCURONIUM BROMIDE 10 MG/ML (PF) SYRINGE
PREFILLED_SYRINGE | INTRAVENOUS | Status: DC | PRN
  Administered 2021-04-06: 50 mg via INTRAVENOUS

## 2021-04-06 MED ORDER — HEMOSTATIC AGENTS (NO CHARGE) OPTIME
TOPICAL | Status: DC | PRN
  Administered 2021-04-06: 1 via TOPICAL

## 2021-04-06 MED ORDER — HYDROMORPHONE HCL 1 MG/ML IJ SOLN
0.2500 mg | INTRAMUSCULAR | Status: DC | PRN
  Administered 2021-04-06 (×2): 0.5 mg via INTRAVENOUS

## 2021-04-06 MED ORDER — PROMETHAZINE HCL 25 MG/ML IJ SOLN: 6.2500 mg | INTRAMUSCULAR | Status: DC | PRN

## 2021-04-06 MED ORDER — DEXAMETHASONE SODIUM PHOSPHATE 10 MG/ML IJ SOLN
INTRAMUSCULAR | Status: DC | PRN
  Administered 2021-04-06: 10 mg via INTRAVENOUS

## 2021-04-06 MED ORDER — LIDOCAINE 2% (20 MG/ML) 5 ML SYRINGE
INTRAMUSCULAR | Status: DC | PRN
  Administered 2021-04-06: 50 mg via INTRAVENOUS

## 2021-04-06 MED ORDER — FENTANYL CITRATE (PF) 100 MCG/2ML IJ SOLN
INTRAMUSCULAR | Status: AC
  Filled 2021-04-06: qty 2

## 2021-04-06 MED ORDER — MIDAZOLAM HCL 5 MG/5ML IJ SOLN
INTRAMUSCULAR | Status: DC | PRN
  Administered 2021-04-06: 2 mg via INTRAVENOUS

## 2021-04-06 MED ORDER — SUGAMMADEX SODIUM 200 MG/2ML IV SOLN
INTRAVENOUS | Status: DC | PRN
  Administered 2021-04-06: 200 mg via INTRAVENOUS

## 2021-04-06 MED ORDER — ACETAMINOPHEN 10 MG/ML IV SOLN
INTRAVENOUS | Status: AC
  Administered 2021-04-06: 12:00:00 1000 mg
  Filled 2021-04-06: qty 100

## 2021-04-06 MED ORDER — MIDAZOLAM HCL 2 MG/2ML IJ SOLN
INTRAMUSCULAR | Status: AC
  Filled 2021-04-06: qty 2

## 2021-04-06 MED ORDER — 0.9 % SODIUM CHLORIDE (POUR BTL) OPTIME
TOPICAL | Status: DC | PRN
  Administered 2021-04-06: 09:00:00 1000 mL

## 2021-04-06 MED ORDER — LISDEXAMFETAMINE DIMESYLATE 30 MG PO CAPS
60.0000 mg | ORAL_CAPSULE | Freq: Every day | ORAL | Status: DC
  Filled 2021-04-06: qty 2

## 2021-04-06 MED ORDER — MIDAZOLAM HCL 2 MG/2ML IJ SOLN: 0.5000 mg | Freq: Once | INTRAMUSCULAR | Status: DC | PRN

## 2021-04-06 MED ORDER — ACETAMINOPHEN 10 MG/ML IV SOLN: 1000.0000 mg | Freq: Once | INTRAVENOUS | Status: DC

## 2021-04-06 MED ORDER — ACETAMINOPHEN 650 MG RE SUPP: 650.0000 mg | Freq: Four times a day (QID) | RECTAL | Status: DC | PRN

## 2021-04-06 MED ORDER — ONDANSETRON HCL 4 MG/2ML IJ SOLN: 4.0000 mg | Freq: Four times a day (QID) | INTRAMUSCULAR | Status: DC | PRN

## 2021-04-06 MED ORDER — CALCIUM CARBONATE 1250 (500 CA) MG PO TABS
2.0000 | ORAL_TABLET | Freq: Three times a day (TID) | ORAL | Status: DC
  Administered 2021-04-06: 21:00:00 1000 mg via ORAL
  Filled 2021-04-06 (×3): qty 1

## 2021-04-06 MED ORDER — CIPROFLOXACIN IN D5W 400 MG/200ML IV SOLN
400.0000 mg | INTRAVENOUS | Status: AC
  Administered 2021-04-06: 09:00:00 400 mg via INTRAVENOUS
  Filled 2021-04-06: qty 200

## 2021-04-06 MED ORDER — CHLORHEXIDINE GLUCONATE CLOTH 2 % EX PADS: 6.0000 | MEDICATED_PAD | Freq: Once | CUTANEOUS | Status: DC

## 2021-04-06 MED ORDER — LACTATED RINGERS IV SOLN: INTRAVENOUS | Status: DC

## 2021-04-06 MED ORDER — PROPOFOL 10 MG/ML IV BOLUS
INTRAVENOUS | Status: AC
  Filled 2021-04-06: qty 20

## 2021-04-06 MED ORDER — PANTOPRAZOLE SODIUM 40 MG PO TBEC
40.0000 mg | DELAYED_RELEASE_TABLET | Freq: Every day | ORAL | Status: DC
  Administered 2021-04-07: 40 mg via ORAL
  Filled 2021-04-06: qty 1

## 2021-04-06 MED ORDER — OXYCODONE HCL 5 MG PO TABS: 5.0000 mg | ORAL_TABLET | Freq: Once | ORAL | Status: DC | PRN

## 2021-04-06 MED ORDER — FENTANYL CITRATE (PF) 100 MCG/2ML IJ SOLN
INTRAMUSCULAR | Status: DC | PRN
  Administered 2021-04-06 (×2): 50 ug via INTRAVENOUS
  Administered 2021-04-06: 100 ug via INTRAVENOUS

## 2021-04-06 MED ORDER — LISDEXAMFETAMINE DIMESYLATE 30 MG PO CAPS: 60.0000 mg | ORAL_CAPSULE | Freq: Every morning | ORAL | Status: DC

## 2021-04-06 MED ORDER — HYDROMORPHONE HCL 1 MG/ML IJ SOLN
INTRAMUSCULAR | Status: AC
  Filled 2021-04-06: qty 1

## 2021-04-06 MED ORDER — LABETALOL HCL 5 MG/ML IV SOLN
5.0000 mg | INTRAVENOUS | Status: DC | PRN
  Administered 2021-04-06 (×2): 5 mg via INTRAVENOUS

## 2021-04-06 MED ORDER — ONDANSETRON HCL 4 MG/2ML IJ SOLN
INTRAMUSCULAR | Status: AC
  Filled 2021-04-06: qty 2

## 2021-04-06 MED ORDER — LIDOCAINE HCL 2 % IJ SOLN
INTRAMUSCULAR | Status: AC
  Filled 2021-04-06: qty 20

## 2021-04-06 MED ORDER — ONDANSETRON 4 MG PO TBDP: 4.0000 mg | ORAL_TABLET | Freq: Four times a day (QID) | ORAL | Status: DC | PRN

## 2021-04-06 MED ORDER — FLUOXETINE HCL 20 MG PO CAPS
40.0000 mg | ORAL_CAPSULE | Freq: Every day | ORAL | Status: DC
  Administered 2021-04-07: 40 mg via ORAL
  Filled 2021-04-06: qty 2

## 2021-04-06 MED ORDER — ACETAMINOPHEN 325 MG PO TABS
650.0000 mg | ORAL_TABLET | Freq: Four times a day (QID) | ORAL | Status: DC | PRN
  Administered 2021-04-06: 20:00:00 650 mg via ORAL
  Filled 2021-04-06: qty 2

## 2021-04-06 MED ORDER — DEXAMETHASONE SODIUM PHOSPHATE 10 MG/ML IJ SOLN
INTRAMUSCULAR | Status: AC
  Filled 2021-04-06: qty 1

## 2021-04-06 MED ORDER — LIDOCAINE HCL (PF) 2 % IJ SOLN
INTRAMUSCULAR | Status: DC | PRN
  Administered 2021-04-06: 1 mg/kg/h via INTRADERMAL

## 2021-04-06 MED ORDER — OXYCODONE HCL 5 MG PO TABS: 5.0000 mg | ORAL_TABLET | ORAL | Status: DC | PRN

## 2021-04-06 MED ORDER — SODIUM CHLORIDE 0.45 % IV SOLN: INTRAVENOUS | Status: DC

## 2021-04-06 MED ORDER — ORAL CARE MOUTH RINSE: 15.0000 mL | Freq: Once | OROMUCOSAL | Status: AC

## 2021-04-06 MED ORDER — LABETALOL HCL 5 MG/ML IV SOLN
INTRAVENOUS | Status: AC
  Filled 2021-04-06: qty 4

## 2021-04-06 MED ORDER — ARTIFICIAL TEARS OPHTHALMIC OINT
TOPICAL_OINTMENT | OPHTHALMIC | Status: AC
  Filled 2021-04-06: qty 3.5

## 2021-04-06 MED ORDER — DIROXIMEL FUMARATE 231 MG PO CPDR: 462.0000 mg | DELAYED_RELEASE_CAPSULE | Freq: Two times a day (BID) | ORAL | Status: DC

## 2021-04-06 SURGICAL SUPPLY — 33 items
ADH SKN CLS APL DERMABOND .7 (GAUZE/BANDAGES/DRESSINGS) ×1
APL PRP STRL LF DISP 70% ISPRP (MISCELLANEOUS) ×1
ATTRACTOMAT 16X20 MAGNETIC DRP (DRAPES) ×2 IMPLANT
BAG COUNTER SPONGE SURGICOUNT (BAG) ×2 IMPLANT
BAG SPNG CNTER NS LX DISP (BAG) ×1
BLADE SURG 15 STRL LF DISP TIS (BLADE) ×1 IMPLANT
BLADE SURG 15 STRL SS (BLADE) ×2
CHLORAPREP W/TINT 26 (MISCELLANEOUS) ×2 IMPLANT
CLIP TI MEDIUM 6 (CLIP) ×6 IMPLANT
CLIP TI WIDE RED SMALL 6 (CLIP) ×10 IMPLANT
COVER SURGICAL LIGHT HANDLE (MISCELLANEOUS) ×2 IMPLANT
DERMABOND ADVANCED (GAUZE/BANDAGES/DRESSINGS) ×1
DERMABOND ADVANCED .7 DNX12 (GAUZE/BANDAGES/DRESSINGS) ×1 IMPLANT
DRAPE LAPAROTOMY T 98X78 PEDS (DRAPES) ×2 IMPLANT
DRAPE UTILITY XL STRL (DRAPES) ×2 IMPLANT
ELECT PENCIL ROCKER SW 15FT (MISCELLANEOUS) ×2 IMPLANT
ELECT REM PT RETURN 15FT ADLT (MISCELLANEOUS) ×2 IMPLANT
GAUZE 4X4 16PLY ~~LOC~~+RFID DBL (SPONGE) ×2 IMPLANT
GLOVE SURG SYN 7.5  E (GLOVE) ×4
GLOVE SURG SYN 7.5 E (GLOVE) ×2 IMPLANT
GOWN STRL REUS W/TWL XL LVL3 (GOWN DISPOSABLE) ×4 IMPLANT
HEMOSTAT SURGICEL 2X4 FIBR (HEMOSTASIS) ×2 IMPLANT
ILLUMINATOR WAVEGUIDE N/F (MISCELLANEOUS) ×2 IMPLANT
KIT BASIN OR (CUSTOM PROCEDURE TRAY) ×2 IMPLANT
KIT TURNOVER KIT A (KITS) IMPLANT
PACK BASIC VI WITH GOWN DISP (CUSTOM PROCEDURE TRAY) ×2 IMPLANT
SHEARS HARMONIC 9CM CVD (BLADE) ×2 IMPLANT
SUT MNCRL AB 4-0 PS2 18 (SUTURE) ×2 IMPLANT
SUT VIC AB 3-0 SH 18 (SUTURE) ×4 IMPLANT
SYR BULB IRRIG 60ML STRL (SYRINGE) ×2 IMPLANT
TOWEL OR 17X26 10 PK STRL BLUE (TOWEL DISPOSABLE) ×2 IMPLANT
TOWEL OR NON WOVEN STRL DISP B (DISPOSABLE) ×2 IMPLANT
TUBING CONNECTING 10 (TUBING) ×2 IMPLANT

## 2021-04-06 NOTE — Anesthesia Preprocedure Evaluation (Signed)
Anesthesia Evaluation  Patient identified by MRN, date of birth, ID band Patient awake    Reviewed: Allergy & Precautions, NPO status , Patient's Chart, lab work & pertinent test results  History of Anesthesia Complications Negative for: history of anesthetic complications  Airway Mallampati: I  TM Distance: >3 FB Neck ROM: Full    Dental  (+) Teeth Intact, Dental Advisory Given, Caps   Pulmonary Current SmokerPatient did not abstain from smoking.,  04/04/2021 SARS coronavirus NEG   breath sounds clear to auscultation       Cardiovascular + dysrhythmias (s/p successful ablation) Atrial Fibrillation + Valvular Problems/Murmurs MVP  Rhythm:Regular Rate:Normal  '21 ECHO: EF 60-65%, normal LVF, Grade 1 DD, no significant valvular abnormalities   Neuro/Psych  Headaches, PSYCHIATRIC DISORDERS (ADD) Anxiety MS    GI/Hepatic GERD  Controlled,(+) Hepatitis -  Endo/Other  Hyperthyroidism   Renal/GU negative Renal ROS     Musculoskeletal  (+) Arthritis , Osteoarthritis,    Abdominal   Peds  Hematology negative hematology ROS (+)   Anesthesia Other Findings   Reproductive/Obstetrics                             Anesthesia Physical Anesthesia Plan  ASA: 3  Anesthesia Plan: General   Post-op Pain Management:    Induction: Intravenous  PONV Risk Score and Plan: 2 and Ondansetron and Dexamethasone  Airway Management Planned: Oral ETT  Additional Equipment: None  Intra-op Plan:   Post-operative Plan: Extubation in OR  Informed Consent: I have reviewed the patients History and Physical, chart, labs and discussed the procedure including the risks, benefits and alternatives for the proposed anesthesia with the patient or authorized representative who has indicated his/her understanding and acceptance.     Dental advisory given  Plan Discussed with: Surgeon and CRNA  Anesthesia Plan  Comments:         Anesthesia Quick Evaluation

## 2021-04-06 NOTE — Interval H&P Note (Signed)
History and Physical Interval Note:  04/06/2021 8:19 AM  [name]  has presented today for surgery, with the diagnosis of GRAVES' DISEASE.  The various methods of treatment have been discussed with the patient and family. After consideration of risks, benefits and other options for treatment, the patient has consented to    Procedure(s): TOTAL THYROIDECTOMY (N/A) as a surgical intervention.    The patient's history has been reviewed, patient examined, no change in status, stable for surgery.  I have reviewed the patient's chart and labs.  Questions were answered to the patient's satisfaction.    Armandina Gemma, [facility] A [place] practice Office: [place]

## 2021-04-06 NOTE — Transfer of Care (Signed)
Immediate Anesthesia Transfer of Care Note  Patient: [name]  Procedure(s) Performed: Procedure(s): TOTAL THYROIDECTOMY (N/A)  Patient Location: PACU  Anesthesia Type:General  Level of Consciousness: Alert, Awake, Oriented  Airway & Oxygen Therapy: Patient Spontanous Breathing  Post-op Assessment: Report given to RN  Post vital signs: Reviewed and stable  Last Vitals:  Vitals:   04/06/21 0632  BP: (!) 147/88  Pulse: (!) 108  Resp: 15  Temp: 36.7 C  SpO2: 66%    Complications: No apparent anesthesia complications

## 2021-04-06 NOTE — Op Note (Signed)
Procedure Note  Pre-operative Diagnosis:  Graves' disease  Post-operative Diagnosis:  same  Surgeon:  Armandina Gemma, MD  Assistant:  none   Procedure:  Total thyroidectomy  Anesthesia:  General  Estimated Blood Loss:  minimal  Drains: none         Specimen: thyroid to pathology  Indications:  Patient is referred by Dr. Elayne Snare for surgical evaluation and management of Graves' disease with ophthalmologic complications. Patient's ophthalmologist is Dr. Nadara Mustard. Her neurologist is Dr. Algie Coffer. Patient does have multiple sclerosis. However, after receiving her booster dosage of COVID vaccine in September 2021, the patient developed a variety of symptoms related to hyperthyroidism. She has been treated with methimazole. She developed ophthalmologic complications including double vision. She is now referred for consideration for total thyroidectomy for management of Graves' disease.  Procedure Details: Procedure was done in OR #4 at the [facility]. The patient was brought to the operating room and placed in a supine position on the operating room table. Following administration of general anesthesia, the patient was positioned and then prepped and draped in the usual aseptic fashion. After ascertaining that an adequate level of anesthesia had been achieved, a small Kocher incision was made with #15 blade. Dissection was carried through subcutaneous tissues and platysma.Hemostasis was achieved with the electrocautery. Skin flaps were elevated cephalad and caudad from the thyroid notch to the sternal notch. A Mahorner self-retaining retractor was placed for exposure. Strap muscles were incised in the midline and dissection was begun on the left side.  Strap muscles were reflected laterally.  Left thyroid lobe was normal in size without nodules.  The left lobe was gently mobilized with blunt dissection. Superior pole vessels were dissected out and divided individually between small and  medium ligaclips with the harmonic scalpel. The thyroid lobe was rolled anteriorly. Branches of the inferior thyroid artery were divided between small ligaclips with the harmonic scalpel. Inferior venous tributaries were divided between ligaclips. Both the superior and inferior parathyroid glands were identified and preserved on their vascular pedicles. The recurrent laryngeal nerve was identified and preserved along its course. The ligament of Gwenlyn Found was released with the electrocautery and the gland was mobilized onto the anterior trachea. Isthmus was mobilized across the midline. There was no pyramidal lobe present. Dry pack was placed in the left neck.  The right thyroid lobe was gently mobilized with blunt dissection. Right thyroid lobe was normal in size without nodules. Superior pole vessels were dissected out and divided between small and medium ligaclips with the Harmonic scalpel. Superior parathyroid was identified and preserved. Inferior venous tributaries were divided between medium ligaclips with the harmonic scalpel. The right thyroid lobe was rolled anteriorly and the branches of the inferior thyroid artery divided between small ligaclips. The right recurrent laryngeal nerve was identified and preserved along its course. The ligament of Gwenlyn Found was released with the electrocautery. The right thyroid lobe was mobilized onto the anterior trachea and the remainder of the thyroid was dissected off the anterior trachea and the thyroid was completely excised. A suture was used to mark the left lobe. The entire thyroid gland was submitted to pathology for review.  The neck was irrigated with warm saline. Fibrillar was placed throughout the operative field. Strap muscles were approximated in the midline with interrupted 3-0 Vicryl sutures. Platysma was closed with interrupted 3-0 Vicryl sutures. Skin was closed with a running 4-0 Monocryl subcuticular suture. Wound was washed and Dermabond was applied. The  patient was awakened from  anesthesia and brought to the recovery room. The patient tolerated the procedure well.   Armandina Gemma, MD [facility] Office: [phone]

## 2021-04-06 NOTE — Anesthesia Procedure Notes (Signed)
Procedure Name: Intubation Date/Time: 04/06/2021 8:43 AM Performed by: Gerald Leitz, CRNA Pre-anesthesia Checklist: Patient identified, Patient being monitored, Timeout performed, Emergency Drugs available and Suction available Patient Re-evaluated:Patient Re-evaluated prior to induction Oxygen Delivery Method: Circle system utilized Preoxygenation: Pre-oxygenation with 100% oxygen Induction Type: IV induction Ventilation: Mask ventilation without difficulty Laryngoscope Size: Mac and 3 Grade View: Grade I Tube type: Oral Tube size: 7.0 mm Number of attempts: 1 Placement Confirmation: ETT inserted through vocal cords under direct vision, positive ETCO2 and breath sounds checked- equal and bilateral Secured at: 21 cm Tube secured with: Tape Dental Injury: Teeth and Oropharynx as per pre-operative assessment

## 2021-04-06 NOTE — Anesthesia Postprocedure Evaluation (Signed)
Anesthesia Post Note  Patient: [name]  Procedure(s) Performed: TOTAL THYROIDECTOMY     Patient location during evaluation: PACU Anesthesia Type: General Level of consciousness: awake and alert, patient cooperative and oriented Pain management: pain level controlled Vital Signs Assessment: post-procedure vital signs reviewed and stable Respiratory status: spontaneous breathing, nonlabored ventilation and respiratory function stable Cardiovascular status: blood pressure returned to baseline and stable Postop Assessment: no apparent nausea or vomiting and able to ambulate Anesthetic complications: no   No notable events documented.  Last Vitals:  Vitals:   04/06/21 1300 04/06/21 1421  BP: (!) 149/98 133/73  Pulse: 68 78  Resp: 15 18  Temp: (!) 36.4 C 36.7 C  SpO2: 96% 97%    Last Pain:  Vitals:   04/06/21 1421  TempSrc: Oral  PainSc:                  Alleyne Lac,E. Ellery Meroney

## 2021-04-07 ENCOUNTER — Other Ambulatory Visit: Payer: Self-pay

## 2021-04-07 ENCOUNTER — Other Ambulatory Visit (HOSPITAL_COMMUNITY): Payer: Self-pay

## 2021-04-07 ENCOUNTER — Other Ambulatory Visit: Payer: Self-pay | Admitting: Endocrinology

## 2021-04-07 ENCOUNTER — Encounter (HOSPITAL_COMMUNITY): Payer: Self-pay | Admitting: Surgery

## 2021-04-07 DIAGNOSIS — I4891 Unspecified atrial fibrillation: Secondary | ICD-10-CM | POA: Diagnosis not present

## 2021-04-07 DIAGNOSIS — K219 Gastro-esophageal reflux disease without esophagitis: Secondary | ICD-10-CM | POA: Diagnosis not present

## 2021-04-07 DIAGNOSIS — E0500 Thyrotoxicosis with diffuse goiter without thyrotoxic crisis or storm: Secondary | ICD-10-CM | POA: Diagnosis not present

## 2021-04-07 DIAGNOSIS — G35 Multiple sclerosis: Secondary | ICD-10-CM | POA: Diagnosis not present

## 2021-04-07 DIAGNOSIS — M1990 Unspecified osteoarthritis, unspecified site: Secondary | ICD-10-CM | POA: Diagnosis not present

## 2021-04-07 DIAGNOSIS — F17210 Nicotine dependence, cigarettes, uncomplicated: Secondary | ICD-10-CM | POA: Diagnosis not present

## 2021-04-07 LAB — BASIC METABOLIC PANEL
Anion gap: 7 (ref 5–15)
BUN: 13 mg/dL (ref 6–20)
CO2: 28 mmol/L (ref 22–32)
Calcium: 9.4 mg/dL (ref 8.9–10.3)
Chloride: 105 mmol/L (ref 98–111)
Creatinine, Ser: 0.45 mg/dL (ref 0.44–1.00)
GFR, Estimated: >60 mL/min (ref >60)
Glucose, Bld: 119 mg/dL — ABNORMAL HIGH (ref 70–99)
Potassium: 3.9 mmol/L (ref 3.5–5.1)
Sodium: 140 mmol/L (ref 135–145)

## 2021-04-07 LAB — SURGICAL PATHOLOGY

## 2021-04-07 MED ORDER — TRAMADOL HCL 50 MG PO TABS
50.0000 mg | ORAL_TABLET | Freq: Four times a day (QID) | ORAL | 0 refills | Status: DC | PRN
Start: 2021-04-07 — End: 2021-06-29
  Filled 2021-04-07: qty 15, 2d supply, fill #0

## 2021-04-07 MED ORDER — CALCIUM CARBONATE ANTACID 500 MG PO CHEW
2.0000 | CHEWABLE_TABLET | Freq: Two times a day (BID) | ORAL | 1 refills | Status: DC
  Filled 2021-04-07: qty 90, 23d supply, fill #0

## 2021-04-07 MED ORDER — LEVOTHYROXINE SODIUM 112 MCG PO TABS
112.0000 ug | ORAL_TABLET | Freq: Every day | ORAL | 3 refills | Status: DC
  Filled 2021-04-07 – 2021-04-25 (×2): qty 30, 30d supply, fill #0

## 2021-04-07 NOTE — Discharge Instructions (Signed)
[facility] - Dr. Nel Stoneking  THYROID & PARATHYROID SURGERY:  POST-OP INSTRUCTIONS  Always review the instruction sheet provided by the hospital nurse at discharge.  A prescription for pain medication may be sent to your pharmacy at the time of discharge.  Take your pain medication as prescribed.  If narcotic pain medicine is not needed, then you may take acetaminophen (Tylenol) or ibuprofen (Advil) as needed for pain or soreness.  Take your normal home medications as prescribed unless otherwise directed.  If you need a refill on your pain medication, please contact the office during regular business hours.  Prescriptions will not be processed by the office after 5:00PM or on weekends.  Start with a light diet upon arrival home, such as soup and crackers or toast.  Be sure to drink plenty of fluids.  Resume your normal diet the day after surgery.  Most patients will experience some swelling and bruising on the chest and neck area.  Ice packs will help for the first 48 hours after arriving home.  Swelling and bruising will take several days to resolve.   It is common to experience some constipation after surgery.  Increasing fluid intake and taking a stool softener (Colace) will usually help to prevent this problem.  A mild laxative (Milk of Magnesia or Miralax) should be taken according to package directions if there has been no bowel movement after 48 hours.  Dermabond glue covers your incision. This seals the wound and you may shower at any time. The Dermabond will remain in place for about a week.  You may gradually remove the glue when it loosens around the edges.  If you need to loosen the Dermabond for removal, apply a layer of Vaseline to the wound for 15 minutes and then remove with a Kleenex. Your sutures are under the skin and will not show - they will dissolve on their own.  You may resume light daily activities beginning the day after discharge (such as self-care,  walking, climbing stairs), gradually increasing activities as tolerated. You may have sexual intercourse when it is comfortable. Refrain from any heavy lifting or straining until approved by your doctor. You may drive when you no longer are taking prescription pain medication, you can comfortably wear a seatbelt, and you can safely maneuver your car and apply the brakes.  You will see your doctor in the office for a follow-up appointment approximately three weeks after your surgery.  Make sure that you call for this appointment within a day or two after you arrive home to insure a convenient appointment time. Please have any requested laboratory tests performed a few days prior to your office visit so that the results will be available at your follow up appointment.  WHEN TO CALL THE [facility] OFFICE: -- Fever greater than 101.5 -- Inability to urinate -- Nausea and/or vomiting - persistent -- Extreme swelling or bruising -- Continued bleeding from incision -- Increased pain, redness, or drainage from the incision -- Difficulty swallowing or breathing -- Muscle cramping or spasms -- Numbness or tingling in hands or around lips  The clinic staff is available to answer your questions during regular business hours.  Please don't hesitate to call and ask to speak to one of the nurses if you have concerns.  [facility] OFFICE: [phone] (24 hours)  Please sign up for MyChart accounts. This will allow you to communicate directly with my nurse or myself without having to call the office. It will also allow you   to view your test results. You will need to enroll in MyChart for my office ([facility]) and for the hospital ([place]).  Danial Sisley, MD [facility] A [facility] practice 

## 2021-04-07 NOTE — Discharge Summary (Signed)
Physician Discharge Summary   Patient ID: [name] MRN: [id number] DOB/AGE: August 30, 1960 60 y.o.  Admit date: 04/06/2021  Discharge date: 04/07/2021  Discharge Diagnoses:  Principal Problem:   Hyperthyroidism Active Problems:   Thyroid eye disease   Graves disease   Discharged Condition: good  Hospital Course: Patient was admitted for observation following thyroid surgery.  Post op course was uncomplicated.  Pain was well controlled.  Tolerated diet.  Post op calcium level on morning following surgery was 9.4 mg/dl.  Patient was prepared for discharge home on POD#1.   Consults: None  Treatments: surgery: total thyroidectomy  Discharge Exam: Blood pressure (!) 153/99, pulse 79, temperature 97.9 F (36.6 C), temperature source Oral, resp. rate 18, height 5' 7.5" (1.715 m), weight 65.6 kg, SpO2 97 %. HEENT - clear Neck - wound dry and intact; Dermabond in place; voice normal  Disposition: Home  Discharge Instructions     Diet - low sodium heart healthy   Complete by: As directed    Increase activity slowly   Complete by: As directed    No dressing needed   Complete by: As directed       Allergies as of 04/07/2021       Reactions   Methylprednisolone Hives   Solu-medrol [methylprednisolone Sodium Succ] Hives, Itching   Wheezing (was getting with Ocrevus as well)   Cephalexin Hives, Itching   Ocrelizumab Palpitations, Other (See Comments)   (Ocrevus) Wheezing.        Medication List     TAKE these medications    calcium carbonate 500 MG chewable tablet Commonly known as: Tums Chew 2 tablets (400 mg of elemental calcium total) by mouth 2 (two) times daily.   erythromycin ophthalmic ointment Apply a 1cm ribbon to both eyes at night time   erythromycin ophthalmic ointment Appl a 1 cm ribbon to both eyes at night time   FLUoxetine 20 MG capsule Commonly known as: PROZAC TAKE 3 CAPSULES (60 MG TOTAL) BY MOUTH DAILY   HYDROcodone-acetaminophen  5-325 MG tablet Commonly known as: NORCO/VICODIN Take 1 tablet by mouth daily as needed (migraine headaches.).   lisdexamfetamine 60 MG capsule Commonly known as: VYVANSE Take 1 capsule (60 mg total) by mouth in the morning.   LORazepam 0.5 MG tablet Commonly known as: ATIVAN Take 1 tablet (0.5 mg total) by mouth daily as needed for anxiety.   Lubricant Eye Drops (PF) 0.1-0.3 % Soln Generic drug: Dextran 70-Hypromellose (PF) Place 1-2 drops into both eyes 3 (three) times daily as needed (dry/irritated eyes.).   naproxen sodium 220 MG tablet Commonly known as: ALEVE Take 440 mg by mouth 2 (two) times daily as needed (pain.).   pantoprazole 40 MG tablet Commonly known as: PROTONIX TAKE 1 TABLET BY MOUTH ONCE A DAY AS DIRECTED   pantoprazole 40 MG tablet Commonly known as: PROTONIX Take 1 tablet (40 mg total) by mouth daily. Must have yearly follow up for refills.   traMADol 50 MG tablet Commonly known as: ULTRAM Take 1-2 tablets (50-100 mg total) by mouth every 6 (six) hours as needed.   Vitamin D (Ergocalciferol) 1.25 MG (50000 UNIT) Caps capsule Commonly known as: DRISDOL Take 1 capsule (50,000 Units total) by mouth every 7 (seven) days.   Vumerity 231 MG Cpdr Generic drug: Diroximel Fumarate Take 462 mg by mouth 2 (two) times daily.               Discharge Care Instructions  (From admission, onward)  Start     Ordered   04/07/21 0000  No dressing needed        04/07/21 6168            Follow-up Information     Armandina Gemma, MD. Schedule an appointment as soon as possible for a visit in 3 week(s).   Specialty: General Surgery Why: For wound re-check Contact information: [place] [address] [phone]         Elayne Snare, MD. Schedule an appointment as soon as possible for a visit in 3 week(s).   Specialty: Endocrinology Contact information: [place]  [phone]                 Sarvesh Meddaugh, [facility] Office: [phone]   Signed: Armandina Gemma 04/07/2021, 8:03 AM

## 2021-04-07 NOTE — Progress Notes (Signed)
Pathology consistent with Graves' disease.  All benign as expected.  [place], MD [facility] A [place] practice Office: [phone]

## 2021-04-07 NOTE — Plan of Care (Signed)
Patient discharged.

## 2021-04-11 NOTE — Progress Notes (Signed)
Thank you :)

## 2021-04-17 ENCOUNTER — Other Ambulatory Visit (HOSPITAL_COMMUNITY): Payer: Self-pay

## 2021-04-21 DIAGNOSIS — E890 Postprocedural hypothyroidism: Secondary | ICD-10-CM | POA: Diagnosis not present

## 2021-04-21 DIAGNOSIS — E0500 Thyrotoxicosis with diffuse goiter without thyrotoxic crisis or storm: Secondary | ICD-10-CM | POA: Diagnosis not present

## 2021-04-25 ENCOUNTER — Other Ambulatory Visit (HOSPITAL_COMMUNITY): Payer: Self-pay

## 2021-04-25 MED ORDER — PANTOPRAZOLE SODIUM 40 MG PO TBEC
40.0000 mg | DELAYED_RELEASE_TABLET | Freq: Every day | ORAL | 0 refills | Status: DC
  Filled 2021-04-25: qty 90, 90d supply, fill #0

## 2021-04-25 MED FILL — Fluoxetine HCl Cap 20 MG: ORAL | 90 days supply | Qty: 270 | Fill #2 | Status: AC

## 2021-05-10 ENCOUNTER — Other Ambulatory Visit (HOSPITAL_COMMUNITY): Payer: Self-pay

## 2021-05-10 DIAGNOSIS — H02211 Cicatricial lagophthalmos right upper eyelid: Secondary | ICD-10-CM | POA: Diagnosis not present

## 2021-05-10 DIAGNOSIS — H02534 Eyelid retraction left upper eyelid: Secondary | ICD-10-CM | POA: Diagnosis not present

## 2021-05-10 DIAGNOSIS — E0500 Thyrotoxicosis with diffuse goiter without thyrotoxic crisis or storm: Secondary | ICD-10-CM | POA: Diagnosis not present

## 2021-05-10 DIAGNOSIS — H05243 Constant exophthalmos, bilateral: Secondary | ICD-10-CM | POA: Diagnosis not present

## 2021-05-10 DIAGNOSIS — H04121 Dry eye syndrome of right lacrimal gland: Secondary | ICD-10-CM | POA: Diagnosis not present

## 2021-05-10 DIAGNOSIS — H02531 Eyelid retraction right upper eyelid: Secondary | ICD-10-CM | POA: Diagnosis not present

## 2021-05-10 DIAGNOSIS — H16213 Exposure keratoconjunctivitis, bilateral: Secondary | ICD-10-CM | POA: Diagnosis not present

## 2021-05-10 DIAGNOSIS — H02052 Trichiasis without entropian right lower eyelid: Secondary | ICD-10-CM | POA: Diagnosis not present

## 2021-05-10 MED ORDER — XIIDRA 5 % OP SOLN
1.0000 [drp] | Freq: Two times a day (BID) | OPHTHALMIC | 11 refills | Status: DC
Start: 2021-05-10 — End: 2022-03-15
  Filled 2021-05-10: qty 60, 30d supply, fill #0

## 2021-05-10 MED ORDER — TOBRAMYCIN-DEXAMETHASONE 0.3-0.1 % OP SUSP
1.0000 [drp] | Freq: Three times a day (TID) | OPHTHALMIC | 0 refills | Status: DC
  Filled 2021-05-10: qty 5, 25d supply, fill #0

## 2021-05-11 ENCOUNTER — Other Ambulatory Visit: Payer: 59

## 2021-05-11 ENCOUNTER — Other Ambulatory Visit (HOSPITAL_COMMUNITY): Payer: Self-pay

## 2021-05-16 ENCOUNTER — Other Ambulatory Visit (INDEPENDENT_AMBULATORY_CARE_PROVIDER_SITE_OTHER): Payer: 59

## 2021-05-16 ENCOUNTER — Other Ambulatory Visit (HOSPITAL_COMMUNITY): Payer: Self-pay

## 2021-05-16 DIAGNOSIS — E0590 Thyrotoxicosis, unspecified without thyrotoxic crisis or storm: Secondary | ICD-10-CM | POA: Diagnosis not present

## 2021-05-16 LAB — TSH: TSH: 45.22 u[IU]/mL — ABNORMAL HIGH (ref 0.35–5.50)

## 2021-05-16 LAB — T4, FREE: Free T4: 0.66 ng/dL (ref 0.60–1.60)

## 2021-05-16 LAB — T3, FREE: T3, Free: 2.9 pg/mL (ref 2.3–4.2)

## 2021-05-16 MED ORDER — XIIDRA 5 % OP SOLN
1.0000 [drp] | Freq: Two times a day (BID) | OPHTHALMIC | 11 refills | Status: DC
  Filled 2021-06-16 – 2021-07-05 (×2): qty 60, 30d supply, fill #0

## 2021-05-16 MED ORDER — TOBRAMYCIN-DEXAMETHASONE 0.3-0.1 % OP SUSP
1.0000 [drp] | Freq: Three times a day (TID) | OPHTHALMIC | 0 refills | Status: DC
  Filled 2021-05-16: qty 5, 7d supply, fill #0

## 2021-05-18 ENCOUNTER — Other Ambulatory Visit (HOSPITAL_COMMUNITY): Payer: Self-pay

## 2021-05-18 ENCOUNTER — Other Ambulatory Visit: Payer: Self-pay | Admitting: Neurology

## 2021-05-18 DIAGNOSIS — H02534 Eyelid retraction left upper eyelid: Secondary | ICD-10-CM | POA: Diagnosis not present

## 2021-05-18 DIAGNOSIS — E0500 Thyrotoxicosis with diffuse goiter without thyrotoxic crisis or storm: Secondary | ICD-10-CM | POA: Diagnosis not present

## 2021-05-18 DIAGNOSIS — H04121 Dry eye syndrome of right lacrimal gland: Secondary | ICD-10-CM | POA: Diagnosis not present

## 2021-05-18 DIAGNOSIS — H02531 Eyelid retraction right upper eyelid: Secondary | ICD-10-CM | POA: Diagnosis not present

## 2021-05-18 DIAGNOSIS — H02211 Cicatricial lagophthalmos right upper eyelid: Secondary | ICD-10-CM | POA: Diagnosis not present

## 2021-05-18 DIAGNOSIS — H05243 Constant exophthalmos, bilateral: Secondary | ICD-10-CM | POA: Diagnosis not present

## 2021-05-18 DIAGNOSIS — H16213 Exposure keratoconjunctivitis, bilateral: Secondary | ICD-10-CM | POA: Diagnosis not present

## 2021-05-18 MED ORDER — DIAZEPAM 5 MG PO TABS
5.0000 mg | ORAL_TABLET | ORAL | 0 refills | Status: DC
Start: 2021-05-18 — End: 2021-06-29
  Filled 2021-05-18: qty 2, 1d supply, fill #0

## 2021-05-18 MED ORDER — LORAZEPAM 1 MG PO TABS
1.0000 mg | ORAL_TABLET | ORAL | 0 refills | Status: DC
Start: 2021-05-18 — End: 2021-06-29
  Filled 2021-05-18: qty 2, 1d supply, fill #0

## 2021-05-18 MED ORDER — ERYTHROMYCIN 5 MG/GM OP OINT
TOPICAL_OINTMENT | OPHTHALMIC | 0 refills | Status: DC
  Filled 2021-05-18: qty 3.5, 3d supply, fill #0

## 2021-05-19 ENCOUNTER — Other Ambulatory Visit: Payer: Self-pay

## 2021-05-19 ENCOUNTER — Other Ambulatory Visit (HOSPITAL_COMMUNITY): Payer: Self-pay

## 2021-05-19 ENCOUNTER — Ambulatory Visit (INDEPENDENT_AMBULATORY_CARE_PROVIDER_SITE_OTHER): Payer: 59 | Admitting: Endocrinology

## 2021-05-19 ENCOUNTER — Encounter: Payer: Self-pay | Admitting: Endocrinology

## 2021-05-19 VITALS — BP 110/68 | HR 77 | Ht 68.0 in | Wt 157.2 lb

## 2021-05-19 DIAGNOSIS — E890 Postprocedural hypothyroidism: Secondary | ICD-10-CM

## 2021-05-19 MED ORDER — LEVOTHYROXINE SODIUM 150 MCG PO TABS
150.0000 ug | ORAL_TABLET | Freq: Every day | ORAL | 3 refills | Status: DC
Start: 2021-05-19 — End: 2021-06-29
  Filled 2021-05-19: qty 30, 30d supply, fill #0

## 2021-05-19 NOTE — Patient Instructions (Signed)
Take 1 1/2 daily of 112ug and next Rx is 150ug

## 2021-05-19 NOTE — Progress Notes (Signed)
Patient ID: [name], female   DOB: 16-Jun-1960, 60 y.o.   MRN: [id number]                                                                                                               Reason for Appointment:  Hyperthyroidism, follow-up visit   Chief complaint: Follow-up   History of Present Illness:   Baseline history: 4 weeks prior to her initial visit she had symptoms of palpitations, dizziness, feeling excessively warm/sweaty, shakiness of her legs, and weight loss The patient had last 15 about  lbs since these symptoms started Apparently she had gastroenteritis-like symptoms for about 5 days right after her Covid booster shot on 01/20/2020 also associated with significant malaise and weakness She was also feeling like her heart was racing and her breathing was difficult Despite her normal appetite she has lost weight  Initially for treatment she was started on methimazole 10 mg twice daily With this she had resolution of her symptoms of palpitations, breathing difficulty, feeling warm and shakiness Her methimazole dose subsequently had been quite variable with her thyroid levels fluctuating between hyperthyroidism and hypothyroidism  Recent history:  Because of persistent hyperthyroidism along with Graves' disease in the ER is as well as higher thyrotropin receptor antibody she was recommended thyroidectomy  She had total thyroidectomy on 04/06/2021 This was uneventful and she has recovered from this  Although she had felt fairly good a week or 2 after her surgery in the last 2 weeks or so she has been feeling much more fatigued She has gained about 10 pounds She is not clear if she has any cold intolerance, no dry skin  She had been started on LEVOTHYROXINE 112 mcg daily on 04/14/2021  She has taken this consistently before breakfast in the morning TSH however is significantly high at 45  Ophthalmopathy: She has had blurring of her vision, somewhat improved with a  course of Tepezza infusions Double vision is not resolved She also has difficulty with 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 (H) 05/16/2021   TSH 0.02 Repeated and verified X2. (L) 03/21/2021   TSH 1.58 01/31/2021    Lab Results  Component Value Date   THYROTRECAB 9.16 (H) 03/21/2021   THYROTRECAB 10.40 (H) 12/21/2020   THYROTRECAB 17.80 (H) 09/23/2020     Allergies as of 05/19/2021       Reactions   Methylprednisolone Hives   Solu-medrol [methylprednisolone Sodium Succ] Hives, Itching   Wheezing (was getting with Ocrevus  as well)   Cephalexin Hives, Itching   Ocrelizumab Palpitations, Other (See Comments)   (Ocrevus) Wheezing.        Medication List        Accurate as of May 19, 2021 11:34 AM. If you have any questions, ask your nurse or doctor.          calcium carbonate 500 MG chewable tablet Commonly known as: Tums Chew 2 tablets (400 mg of elemental calcium total) by mouth 2 (two) times daily.   diazepam 5 MG tablet Commonly known as: VALIUM Take 1 tablet (5 mg total) by mouth 90 minutes before procedure and bring bottle with you   erythromycin ophthalmic ointment Apply a 1cm ribbon to both eyes at night time   erythromycin ophthalmic ointment Appl a 1 cm ribbon to both eyes at night time   erythromycin ophthalmic ointment Apply to sutures or operative site twice a day for three days then stop   FLUoxetine 20 MG capsule Commonly known as: PROZAC TAKE 3 CAPSULES (60 MG TOTAL) BY MOUTH DAILY    HYDROcodone-acetaminophen 5-325 MG tablet Commonly known as: NORCO/VICODIN Take 1 tablet by mouth daily as needed (migraine headaches.).   levothyroxine 112 MCG tablet Commonly known as: SYNTHROID Take 1 tablet (112 mcg total) by mouth daily before breakfast. Start on 04/14/2021   LORazepam 0.5 MG tablet Commonly known as: ATIVAN Take 1 tablet (0.5 mg total) by mouth daily as needed for anxiety.   LORazepam 1 MG tablet Commonly known as: ATIVAN Take 1 tablet (1 mg total) by mouth 90 minutes before procedure and bring bottle with you   Lubricant Eye Drops (PF) 0.1-0.3 % Soln Generic drug: Dextran 70-Hypromellose (PF) Place 1-2 drops into both eyes 3 (three) times daily as needed (dry/irritated eyes.).   naproxen sodium 220 MG tablet Commonly known as: ALEVE Take 440 mg by mouth 2 (two) times daily as needed (pain.).   pantoprazole 40 MG tablet Commonly known as: PROTONIX TAKE 1 TABLET BY MOUTH ONCE A DAY AS DIRECTED   pantoprazole 40 MG tablet Commonly known as: PROTONIX Take 1 tablet (40 mg total) by mouth daily as directed   tobramycin-dexamethasone ophthalmic solution Commonly known as: TobraDex Place 1 drop into the right eye 3 (three) times daily for 1 week, then stop   traMADol 50 MG tablet Commonly known as: ULTRAM Take 1-2 tablets (50-100 mg total) by mouth every 6 (six) hours as needed.   Vitamin D (Ergocalciferol) 1.25 MG (50000 UNIT) Caps capsule Commonly known as: DRISDOL Take 1 capsule (50,000 Units total) by mouth every 7 (seven) days.   Vumerity 231 MG Cpdr Generic drug: Diroximel Fumarate TAKE 2 CAPSULES BY MOUTH TWICE A DAY   Vyvanse 60 MG capsule Generic drug: lisdexamfetamine Take 1 capsule (60 mg total) by mouth in the morning.   Xiidra 5 % Soln Generic drug: Lifitegrast Place 1 drop into both eyes 2 (two) times daily approximately 12 hours apart   Xiidra 5 % Soln Generic drug: Lifitegrast Place 1 drop into both eyes 2 (two) times daily  about 12 hours apart            Past Medical History:  Diagnosis Date   ADD (attention deficit disorder with hyperactivity)    Anxiety    Arthritis    Cancer ([place])    Skin cancer- basal cell, squamous cell   Cervical dysplasia    Family history of adverse reaction to anesthesia    mother due to mitral valve regergitation  GERD (gastroesophageal reflux disease)    Headache    migraine   Hepatitis    Pt states Core and surface antibioties for Hepatitis whens he was 18   Hyperthyroidism    Mitral valve prolapse    Multiple sclerosis ([facility])    Multiple sclerosis ([facility])    Pneumonia    Tendonitis in rigtht wrist     Past Surgical History:  Procedure Laterality Date   ABDOMINAL HYSTERECTOMY  2004   ovaries retained   BACK SURGERY     heart ablation     15 yrs ago for a fib by Dr.Klein, everything ok   KNEE ARTHROSCOPY WITH SUBCHONDROPLASTY Left 11/13/2017   Procedure: LEFT KNEE ARTHROSCOPY WITH SUBCHONDROPLASTY, PARTIAL MEDIAL MENISCECTOMY, SYNOVECTOMY;  Surgeon: Leandrew Koyanagi, MD;  Location: [facility];  Service: Orthopedics;  Laterality: Left;   THYROIDECTOMY N/A 04/06/2021   Procedure: TOTAL THYROIDECTOMY;  Surgeon: Armandina Gemma, MD;  Location: [facility];  Service: General;  Laterality: N/A;   TUBAL LIGATION      Family History  Problem Relation Age of Onset   Depression Mother    Stroke Mother    Heart disease Mother    Dementia Mother    Hypothyroidism Mother    Stroke Father    Heart disease Father    Parkinson's disease Maternal Grandmother    Parkinson's disease Maternal Grandfather    Heart disease Paternal Grandmother    COPD Paternal Grandfather    Healthy Brother    Cancer Neg Hx    Diabetes Neg Hx    Breast cancer Neg Hx     Social History:  reports that she has been smoking cigarettes. She has been smoking an average of .25 packs per day. She has never used smokeless tobacco. She reports current alcohol use. She reports that she does not  use drugs.  Allergies:  Allergies  Allergen Reactions   Methylprednisolone Hives   Solu-Medrol [Methylprednisolone Sodium Succ] Hives and Itching    Wheezing (was getting with Ocrevus as well)   Cephalexin Hives and Itching   Ocrelizumab Palpitations and Other (See Comments)    (Ocrevus) Wheezing.     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 Any transcriptional errors that result from this process are unintentional.

## 2021-05-25 ENCOUNTER — Other Ambulatory Visit (HOSPITAL_COMMUNITY): Payer: Self-pay

## 2021-05-25 DIAGNOSIS — H16211 Exposure keratoconjunctivitis, right eye: Secondary | ICD-10-CM | POA: Diagnosis not present

## 2021-05-25 DIAGNOSIS — H02531 Eyelid retraction right upper eyelid: Secondary | ICD-10-CM | POA: Diagnosis not present

## 2021-05-30 ENCOUNTER — Other Ambulatory Visit: Payer: 59

## 2021-05-30 ENCOUNTER — Telehealth: Payer: Self-pay | Admitting: *Deleted

## 2021-05-30 NOTE — Telephone Encounter (Signed)
Submitted PA Vumerity on CMM. Key: [id number]. Waiting on determination from [place].

## 2021-05-30 NOTE — Telephone Encounter (Signed)
The request has been approved. The authorization is effective for a maximum of 12 fills from 05/30/2021 to 05/29/2022, as long as the member is enrolled in their current health plan. The request was approved with a quantity restriction. This has been approved for a max daily dosage of 4. A written notification letter will follow with additional details.

## 2021-06-01 ENCOUNTER — Ambulatory Visit: Payer: 59 | Admitting: Endocrinology

## 2021-06-01 DIAGNOSIS — C44729 Squamous cell carcinoma of skin of left lower limb, including hip: Secondary | ICD-10-CM | POA: Diagnosis not present

## 2021-06-01 DIAGNOSIS — C44629 Squamous cell carcinoma of skin of left upper limb, including shoulder: Secondary | ICD-10-CM | POA: Diagnosis not present

## 2021-06-02 ENCOUNTER — Other Ambulatory Visit (HOSPITAL_COMMUNITY): Payer: Self-pay

## 2021-06-02 DIAGNOSIS — K219 Gastro-esophageal reflux disease without esophagitis: Secondary | ICD-10-CM | POA: Diagnosis not present

## 2021-06-02 DIAGNOSIS — R159 Full incontinence of feces: Secondary | ICD-10-CM | POA: Diagnosis not present

## 2021-06-02 MED ORDER — PANTOPRAZOLE SODIUM 40 MG PO TBEC
40.0000 mg | DELAYED_RELEASE_TABLET | Freq: Every day | ORAL | 3 refills | Status: DC
  Filled 2021-06-02: qty 90, 90d supply, fill #0

## 2021-06-12 ENCOUNTER — Telehealth (HOSPITAL_COMMUNITY): Payer: 59 | Admitting: Psychiatry

## 2021-06-12 ENCOUNTER — Other Ambulatory Visit: Payer: Self-pay

## 2021-06-12 ENCOUNTER — Other Ambulatory Visit (HOSPITAL_COMMUNITY): Payer: Self-pay

## 2021-06-16 ENCOUNTER — Other Ambulatory Visit (HOSPITAL_COMMUNITY): Payer: Self-pay

## 2021-06-21 ENCOUNTER — Encounter: Payer: Self-pay | Admitting: Endocrinology

## 2021-06-21 ENCOUNTER — Other Ambulatory Visit (HOSPITAL_COMMUNITY): Payer: Self-pay

## 2021-06-22 ENCOUNTER — Ambulatory Visit: Payer: 59 | Admitting: Nurse Practitioner

## 2021-06-26 ENCOUNTER — Other Ambulatory Visit: Payer: Self-pay

## 2021-06-26 ENCOUNTER — Other Ambulatory Visit (INDEPENDENT_AMBULATORY_CARE_PROVIDER_SITE_OTHER): Payer: 59

## 2021-06-26 ENCOUNTER — Other Ambulatory Visit (HOSPITAL_COMMUNITY): Payer: Self-pay

## 2021-06-26 DIAGNOSIS — E890 Postprocedural hypothyroidism: Secondary | ICD-10-CM | POA: Diagnosis not present

## 2021-06-26 LAB — TSH: TSH: 6.88 u[IU]/mL — ABNORMAL HIGH (ref 0.35–5.50)

## 2021-06-26 LAB — T4, FREE: Free T4: 1.14 ng/dL (ref 0.60–1.60)

## 2021-06-29 ENCOUNTER — Ambulatory Visit: Payer: 59 | Admitting: Endocrinology

## 2021-06-29 ENCOUNTER — Other Ambulatory Visit: Payer: Self-pay

## 2021-06-29 ENCOUNTER — Encounter: Payer: Self-pay | Admitting: Endocrinology

## 2021-06-29 ENCOUNTER — Other Ambulatory Visit (HOSPITAL_COMMUNITY): Payer: Self-pay

## 2021-06-29 VITALS — BP 122/84 | HR 77 | Ht 67.5 in | Wt 160.4 lb

## 2021-06-29 DIAGNOSIS — E890 Postprocedural hypothyroidism: Secondary | ICD-10-CM | POA: Diagnosis not present

## 2021-06-29 MED ORDER — LEVOTHYROXINE SODIUM 175 MCG PO TABS
175.0000 ug | ORAL_TABLET | Freq: Every day | ORAL | 3 refills | Status: DC
  Filled 2021-06-29: qty 84, 90d supply, fill #0
  Filled 2021-11-14: qty 84, 90d supply, fill #1

## 2021-06-29 NOTE — Progress Notes (Signed)
Patient ID: [name], female   DOB: Mar 13, 1961, 61 y.o.   MRN: [id number]                                                                                                               Reason for Appointment:  Hyperthyroidism, follow-up visit   Chief complaint: Follow-up   History of Present Illness:   Baseline history: 4 weeks prior to her initial visit she had symptoms of palpitations, dizziness, feeling excessively warm/sweaty, shakiness of her legs, and weight loss The patient had last 15 about  lbs since these symptoms started Apparently she had gastroenteritis-like symptoms for about 5 days right after her Covid booster shot on 01/20/2020 also associated with significant malaise and weakness She was also feeling like her heart was racing and her breathing was difficult Despite her normal appetite she has lost weight  Initially for treatment she was started on methimazole 10 mg twice daily With this she had resolution of her symptoms of palpitations, breathing difficulty, feeling warm and shakiness Her methimazole dose subsequently had been quite variable with her thyroid levels fluctuating between hyperthyroidism and hypothyroidism  Recent history:  Because of persistent hyperthyroidism along with Graves' ophthalmic disease as well as higher thyrotropin receptor antibody she was recommended thyroidectomy She had total thyroidectomy on 04/06/2021  Since about 2 weeks after her surgery she had been feeling much more fatigued She has gained weight progressively also  She had been started on LEVOTHYROXINE 112 mcg daily on 04/14/2021 However this was increased to 150 mcg as of 05/19/2021 because of continued hypothyroidism and high TSH of 45  Since her dosage increase she has felt generally much better but for the last 2 or 3 weeks is complaining of not being able to think clearly  She has taken her levothyroxine consistently before breakfast in the morning TSH is improved but  still high at 6.9   Ophthalmopathy: She has had blurring of her vision, somewhat improved with a course of Tepezza infusions Double vision is mostly present vertically in the evenings when she is driving but not during the day  She is being followed by her ophthalmologist regularly   Wt Readings from Last 3 Encounters:  06/29/21 160 lb 6.4 oz (72.8 kg)  05/19/21 157 lb 3.2 oz (71.3 kg)  04/06/21 144 lb 9.6 oz (65.6 kg)    02/03/2020: Total T4 = 12.2  and total T3 321, high  Lab Results  Component Value Date   FREET4 1.14 06/26/2021   FREET4 0.66 05/16/2021   FREET4 1.22 03/21/2021   T3FREE 2.9 05/16/2021   T3FREE 3.5 03/21/2021   T3FREE 3.0 01/31/2021   TSH 6.88 (H) 06/26/2021   TSH 45.22 Repeated and verified X2. (H) 05/16/2021   TSH 0.02 Repeated and verified X2. (L) 03/21/2021    Lab Results  Component Value Date   THYROTRECAB 9.16 (H) 03/21/2021   THYROTRECAB 10.40 (H) 12/21/2020   THYROTRECAB 17.80 (H) 09/23/2020     Allergies as of 06/29/2021  Reactions   Methylprednisolone Hives   Solu-medrol [methylprednisolone Sodium Succ] Hives, Itching   Wheezing (was getting with Ocrevus as well)   Cephalexin Hives, Itching   Ocrelizumab Palpitations, Other (See Comments)   (Ocrevus) Wheezing.        Medication List        Accurate as of June 29, 2021  2:17 PM. If you have any questions, ask your nurse or doctor.          STOP taking these medications    traMADol 50 MG tablet Commonly known as: ULTRAM Stopped by: Elayne Snare, MD   Vitamin D (Ergocalciferol) 1.25 MG (50000 UNIT) Caps capsule Commonly known as: DRISDOL Stopped by: Elayne Snare, MD       TAKE these medications    calcium carbonate 500 MG chewable tablet Commonly known as: Tums Chew 2 tablets (400 mg of elemental calcium total) by mouth 2 (two) times daily.   diazepam 5 MG tablet Commonly known as: VALIUM Take 1 tablet (5 mg total) by mouth 90 minutes before procedure and  bring bottle with you   erythromycin ophthalmic ointment Apply a 1cm ribbon to both eyes at night time   erythromycin ophthalmic ointment Appl a 1 cm ribbon to both eyes at night time   erythromycin ophthalmic ointment Apply to sutures or operative site twice a day for three days then stop   FLUoxetine 20 MG capsule Commonly known as: PROZAC TAKE 3 CAPSULES (60 MG TOTAL) BY MOUTH DAILY   HYDROcodone-acetaminophen 5-325 MG tablet Commonly known as: NORCO/VICODIN Take 1 tablet by mouth daily as needed (migraine headaches.).   levothyroxine 150 MCG tablet Commonly known as: SYNTHROID Take 1 tablet (150 mcg total) by mouth daily.   LORazepam 0.5 MG tablet Commonly known as: ATIVAN Take 1 tablet (0.5 mg total) by mouth daily as needed for anxiety. What changed: Another medication with the same name was removed. Continue taking this medication, and follow the directions you see here. Changed by: Elayne Snare, MD   Lubricant Eye Drops (PF) 0.1-0.3 % Soln Generic drug: Dextran 70-Hypromellose (PF) Place 1-2 drops into both eyes 3 (three) times daily as needed (dry/irritated eyes.).   naproxen sodium 220 MG tablet Commonly known as: ALEVE Take 440 mg by mouth 2 (two) times daily as needed (pain.).   pantoprazole 40 MG tablet Commonly known as: PROTONIX TAKE 1 TABLET BY MOUTH ONCE A DAY AS DIRECTED What changed: Another medication with the same name was removed. Continue taking this medication, and follow the directions you see here. Changed by: Elayne Snare, MD   tobramycin-dexamethasone ophthalmic solution Commonly known as: TobraDex Place 1 drop into the right eye 3 (three) times daily for 1 week, then stop   Vumerity 231 MG Cpdr Generic drug: Diroximel Fumarate TAKE 2 CAPSULES BY MOUTH TWICE A DAY   Vyvanse 60 MG capsule Generic drug: lisdexamfetamine Take 1 capsule (60 mg total) by mouth in the morning.   Xiidra 5 % Soln Generic drug: Lifitegrast Place 1 drop into both  eyes 2 (two) times daily approximately 12 hours apart   Xiidra 5 % Soln Generic drug: Lifitegrast Place 1 drop into both eyes 2 (two) times daily about 12 hours apart            Past Medical History:  Diagnosis Date   ADD (attention deficit disorder with hyperactivity)    Anxiety    Arthritis    Cancer ([place])    Skin cancer- basal cell, squamous cell  Cervical dysplasia    Family history of adverse reaction to anesthesia    mother due to mitral valve regergitation   GERD (gastroesophageal reflux disease)    Headache    migraine   Hepatitis    Pt states Core and surface antibioties for Hepatitis whens he was 18   Hyperthyroidism    Mitral valve prolapse    Multiple sclerosis ([place])    Multiple sclerosis ([place])    Pneumonia    Tendonitis in rigtht wrist     Past Surgical History:  Procedure Laterality Date   ABDOMINAL HYSTERECTOMY  2004   ovaries retained   BACK SURGERY     heart ablation     15 yrs ago for a fib by Dr.Klein, everything ok   KNEE ARTHROSCOPY WITH SUBCHONDROPLASTY Left 11/13/2017   Procedure: LEFT KNEE ARTHROSCOPY WITH SUBCHONDROPLASTY, PARTIAL MEDIAL MENISCECTOMY, SYNOVECTOMY;  Surgeon: Leandrew Koyanagi, MD;  Location: [facility];  Service: Orthopedics;  Laterality: Left;   THYROIDECTOMY N/A 04/06/2021   Procedure: TOTAL THYROIDECTOMY;  Surgeon: Armandina Gemma, MD;  Location: [facility];  Service: General;  Laterality: N/A;   TUBAL LIGATION      Family History  Problem Relation Age of Onset   Depression Mother    Stroke Mother    Heart disease Mother    Dementia Mother    Hypothyroidism Mother    Stroke Father    Heart disease Father    Parkinson's disease Maternal Grandmother    Parkinson's disease Maternal Grandfather    Heart disease Paternal Grandmother    COPD Paternal Grandfather    Healthy Brother    Cancer Neg Hx    Diabetes Neg Hx    Breast cancer Neg Hx     Social History:  reports that she has been smoking cigarettes. She  has been smoking an average of .25 packs per day. She has never used smokeless tobacco. She reports current alcohol use. She reports that she does not use drugs.  Allergies:  Allergies  Allergen Reactions   Methylprednisolone Hives   Solu-Medrol [Methylprednisolone Sodium Succ] Hives and Itching    Wheezing (was getting with Ocrevus as well)   Cephalexin Hives and Itching   Ocrelizumab Palpitations and Other (See Comments)    (Ocrevus) Wheezing.     Review of Systems     Examination:   BP 122/84    Pulse 77    Ht 5' 7.5" (1.715 m)    Wt 160 lb 6.4 oz (72.8 kg)    SpO2 99%    BMI 24.75 kg/m   She has mild proptosis, more on the left  Biceps reflexes show normal relaxation  No peripheral edema   Assessment/Plan:  Hypothyroidism postsurgical with history of Graves' disease  She is now on 150 mcg of levothyroxine for the last 6 weeks Her weight after her thyroidectomy was only 144 mcg and appears to be still needing relatively high doses of levothyroxine  She is having only mild fatigue now and looks euthyroid TSH is improved but still high at 6.9  She will now go up to the 175 mcg prescription of levothyroxine but take 6-1/2 tablets a week Explained to her how to do this with the half tablet on Sundays  Follow-up in 3 months unless having fatigue  She will discuss her brain fog symptoms with her neurologist  Elayne Snare 06/29/2021, 2:17 PM    Note: This office note was prepared with Dragon voice recognition system technology. Any transcriptional errors  that result from this process are unintentional.

## 2021-07-05 ENCOUNTER — Other Ambulatory Visit (HOSPITAL_COMMUNITY): Payer: Self-pay

## 2021-07-06 ENCOUNTER — Other Ambulatory Visit (HOSPITAL_COMMUNITY): Payer: Self-pay

## 2021-07-06 MED ORDER — PANTOPRAZOLE SODIUM 40 MG PO TBEC
40.0000 mg | DELAYED_RELEASE_TABLET | Freq: Every day | ORAL | 3 refills | Status: DC
  Filled 2021-07-06: qty 90, 90d supply, fill #0
  Filled 2021-11-16: qty 90, 90d supply, fill #1
  Filled 2022-02-14: qty 90, 90d supply, fill #2
  Filled 2022-05-19: qty 90, 90d supply, fill #3

## 2021-07-07 ENCOUNTER — Other Ambulatory Visit (HOSPITAL_COMMUNITY): Payer: Self-pay

## 2021-07-18 ENCOUNTER — Encounter: Payer: Self-pay | Admitting: Neurology

## 2021-07-24 ENCOUNTER — Other Ambulatory Visit: Payer: 59

## 2021-08-16 ENCOUNTER — Encounter: Payer: Self-pay | Admitting: Neurology

## 2021-08-16 ENCOUNTER — Ambulatory Visit (INDEPENDENT_AMBULATORY_CARE_PROVIDER_SITE_OTHER): Payer: Self-pay | Admitting: Neurology

## 2021-08-16 ENCOUNTER — Other Ambulatory Visit (HOSPITAL_COMMUNITY): Payer: Self-pay

## 2021-08-16 VITALS — BP 144/92 | HR 92 | Ht 67.5 in | Wt 166.0 lb

## 2021-08-16 DIAGNOSIS — G35 Multiple sclerosis: Secondary | ICD-10-CM

## 2021-08-16 DIAGNOSIS — R5383 Other fatigue: Secondary | ICD-10-CM

## 2021-08-16 DIAGNOSIS — E079 Disorder of thyroid, unspecified: Secondary | ICD-10-CM

## 2021-08-16 DIAGNOSIS — H5789 Other specified disorders of eye and adnexa: Secondary | ICD-10-CM

## 2021-08-16 DIAGNOSIS — R32 Unspecified urinary incontinence: Secondary | ICD-10-CM

## 2021-08-16 DIAGNOSIS — E0500 Thyrotoxicosis with diffuse goiter without thyrotoxic crisis or storm: Secondary | ICD-10-CM

## 2021-08-16 MED ORDER — TIMOLOL MALEATE 0.5 % OP SOLN
1.0000 [drp] | Freq: Two times a day (BID) | OPHTHALMIC | 3 refills | Status: DC
  Filled 2021-08-16: qty 5, 25d supply, fill #0

## 2021-08-16 MED ORDER — PREDNISONE 20 MG PO TABS
80.0000 mg | ORAL_TABLET | Freq: Every day | ORAL | 0 refills | Status: DC
Start: 2021-08-16 — End: 2021-08-24
  Filled 2021-08-16: qty 32, 8d supply, fill #0

## 2021-08-16 NOTE — Progress Notes (Signed)
? ?[facility] ? ?PATIENT: [name] ?DOB: 09-16-1960 ? ? ? ? ?HISTORICAL ? ?CHIEF COMPLAINT:  ?Chief Complaint  ?Patient presents with  ? Follow-up  ?  Pt alone, rm 2. Pt states that she was having some brain fog and her thyroid levels have been of concern causing issues with her eyes. She is in need of thyroid levels checked  ? Other  ?  [id number].   ? ? ?HISTORY OF PRESENT ILLNESS:  ?[name] is a 61 y.o.woman with relapsing remitting multiple sclerosis.  ? ?Update 08/16/2021 ?She has Grave's disease (has Thyrotropin receptor Abs).  She is on methimazole and more recently was on Tepeza (had 8 infusions) with benefit - less diplopia.   She has needed eyelid surgery.   Recent TSH was 6.88 (06/26/2021)and normal T4-free.   [name]  Recent Thyrotropin Receptor Ab was 10.40 (was 33 at max).     She had her thyroid removed and is on synthroid (now on 175 mcg 6 days a week and 12 that dose 1 day a week) ? ?She just saw ophthalmology today.    She was placed on prednisone now and drops for intraocular pressure ? ?She is now on Vumerity and tolerates it well   She was allergic to [place] and had multiple skin cancers (basal and squamous) while on Gilenya (still has had some on other medications).   She has no recent skin lesions.      ? ?She feels she is doing well with her MS.   Gait is mildly of balanced but she feels partially due to reduced depth perception.   She has urinary incontinence..   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    She has extreme urgency with both and can't always get to a facility in time.   This is more of a problem at work so she wears pads.     ? 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   She has some fatigue.  She sleeps poorly now due to eye pain.  Mood is doing ok on current  fluoxetine ? ?Vyvanse helps ADD.     ? ?MS history: ?She was diagnosed with relapsing remitting MS around 2000 after presenting with numbness in the hands and feet.  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 She was admitted to the hospital. She then was diagnosed with MS and is to see Dr. Jacqulynn Cadet at [facility]. He started her on Betaseron.   She did fairly well on Betaseron. She did have several small exacerbations and received some steroids a couple times. Often the exacerbations would be associated with headache as well. About 4 years ago, she opted to switch to Gilenya to have oral therapy for the MS. She has done very well on that medication without any exacerbations.  Her June 2015 MRI did show one focus that was not present on her MRI from March 2010.  Due to several skin cancers, she switched to Copaxone in 2018 but had breakthrough activity.  She switched to [place] in 2019 but had difficulty tolerating the medication.  She switched to Vumerity in early 2020. ? ?Recent imaging: ?MRI brain 03/02/2019 showed a "new lesion involving the right superior temporal gyrus consistent ?with progression of disease.  Increased prominence of lesions in the subcortical white matter ?of the cingulate gyrus bilaterally, right greater than left. This could impact symptoms in the feet or lower extremities and is also consistent with progression of disease.  Progressive signal change in the left sylvian fissure. No enhancement or restricted diffusion to suggest active demyelination." ? ?MRI of the cervical spine 02/24/2004 showed a normal spinal cord and a disc protrusion to the right at C5-C6 causing right C6 nerve root encroachment. ? ?MRI of the thoracic spine 08/16/2005 showed disc protrusion at T9-T10 and milder protrusions elsewhere but no evidence of demyelination ? ?ALLERGIES: ?Allergies  ?Allergen Reactions  ? Methylprednisolone Hives  ? Solu-Medrol [Methylprednisolone Sodium  Succ] Hives and Itching  ?  Wheezing (was getting with Ocrevus as well)  ? Cephalexin Hives and Itching  ? Ocrelizumab Palpitations and Other (See Comments)  ?  (Ocrevus) Wheezing.  ? ? ?HOME MEDICATIONS: ? ?Current Outpatient Medications:  ?  Dextran 70-Hypromellose, PF, (LUBRICANT EYE DROPS, PF,) 0.1-0.3 % SOLN, Place 1-2 drops into both eyes 3 (three) times daily as needed (dry/irritated eyes.)., Disp: , Rfl:  ?  erythromycin ophthalmic ointment, Apply a 1cm ribbon to both eyes at night time, Disp: 3.5 g, Rfl: 8 ?  erythromycin ophthalmic ointment, Apply to sutures or operative site twice a day for three days then stop, Disp: 3.5 g, Rfl: 0 ?  HYDROcodone-acetaminophen (NORCO/VICODIN) 5-325 MG tablet, Take 1 tablet by mouth daily as needed (migraine headaches.)., Disp: , Rfl:  ?  levothyroxine (SYNTHROID) 175 MCG tablet, Take 1 tablet (175 mcg total) by mouth daily except 1/2 pill on Sunday, Disp: 90 tablet, Rfl: 3 ?  Lifitegrast (XIIDRA) 5 % SOLN, Place 1 drop into both eyes 2 (two) times daily approximately 12 hours apart, Disp: 60 each, Rfl: 11 ?  Lifitegrast (XIIDRA) 5 % SOLN, Place 1 drop into both eyes 2 (two) times daily about 12 hours apart, Disp: 60 each, Rfl: 11 ?  lisdexamfetamine (VYVANSE) 60 MG capsule, Take 1 capsule (60 mg total) by mouth in the morning., Disp: 90 capsule, Rfl: 0 ?  naproxen sodium (ALEVE) 220 MG tablet, Take 440 mg by mouth 2 (two) times daily as needed (pain.)., Disp: , Rfl:  ?  pantoprazole (PROTONIX) 40 MG tablet, TAKE 1 TABLET BY MOUTH ONCE A DAY AS DIRECTED, Disp: 90 tablet, Rfl: 3 ?  pantoprazole (PROTONIX) 40 MG tablet, Take 1 tablet (40 mg total) by mouth daily., Disp: 90 tablet, Rfl: 3 ?  predniSONE (DELTASONE) 20 MG tablet, Take 4 tablets (80 mg total) by mouth daily for 8 days., Disp: 32 tablet, Rfl: 0 ?  predniSONE (DELTASONE) 20 MG tablet, prednisone 20 mg tablet  Take 4 tablets every day by oral route for 8 days., Disp: , Rfl:  ?  timolol (TIMOPTIC) 0.5 % ophthalmic  solution, Place 1 drop into affected eye(s) 2 (two) times daily., Disp: 5 mL, Rfl: 3 ?  tobramycin-dexamethasone (TOBRADEX) ophthalmic solution, Place 1 drop into the right eye 3 (three) times daily for 1 week, then stop, Disp: 5 mL, Rfl: 0 ?  VUMERITY 231 MG CPDR, TAKE 2 CAPSULES BY MOUTH TWICE A DAY, Disp: 120 capsule, Rfl: 11 ?  FLUoxetine (PROZAC) 20 MG capsule, TAKE 3 CAPSULES (60 MG TOTAL) BY MOUTH DAILY, Disp: 270 capsule, Rfl: 3 ? ?PAST MEDICAL HISTORY: ?Past Medical History:  ?Diagnosis Date  ? ADD (attention deficit disorder with hyperactivity)   ? Anxiety   ? Arthritis   ? Cancer [facility])   ?  Skin cancer- basal cell, squamous cell  ? Cervical dysplasia   ? Family history of adverse reaction to anesthesia   ? mother due to mitral valve regergitation  ? GERD (gastroesophageal reflux disease)   ? Headache   ? migraine  ? Hepatitis   ? Pt states Core and surface antibioties for Hepatitis whens he was 88  ? Hyperthyroidism   ? Mitral valve prolapse   ? Multiple sclerosis ([place])   ? Multiple sclerosis ([place])   ? Pneumonia   ? Tendonitis in rigtht wrist   ? ? ?PAST SURGICAL HISTORY: ?Past Surgical History:  ?Procedure Laterality Date  ? ABDOMINAL HYSTERECTOMY  2004  ? ovaries retained  ? BACK SURGERY    ? heart ablation    ? 15 yrs ago for a fib by Dr.Klein, everything ok  ? KNEE ARTHROSCOPY WITH SUBCHONDROPLASTY Left 11/13/2017  ? Procedure: LEFT KNEE ARTHROSCOPY WITH SUBCHONDROPLASTY, PARTIAL MEDIAL MENISCECTOMY, SYNOVECTOMY;  Surgeon: Leandrew Koyanagi, MD;  Location: [place];  Service: Orthopedics;  Laterality: Left;  ? THYROIDECTOMY N/A 04/06/2021  ? Procedure: TOTAL THYROIDECTOMY;  Surgeon: Armandina Gemma, MD;  Location: [facility];  Service: General;  Laterality: N/A;  ? TUBAL LIGATION    ? ? ?FAMILY HISTORY: ?Family History  ?Problem Relation Age of Onset  ? Depression Mother   ? Stroke Mother   ? Heart disease Mother   ? Dementia Mother   ? Hypothyroidism Mother   ? Stroke Father   ? Heart disease  Father   ? Parkinson's disease Maternal Grandmother   ? Parkinson's disease Maternal Grandfather   ? Heart disease Paternal Grandmother   ? COPD Paternal Grandfather   ? Healthy Brother   ? Cancer Neg Hx   ? Diabetes Neg Hx

## 2021-08-17 ENCOUNTER — Encounter: Payer: Self-pay | Admitting: Endocrinology

## 2021-08-17 LAB — CBC WITH DIFFERENTIAL/PLATELET
Basophils Absolute: 0.1 10*3/uL (ref 0.0–0.2)
Basos: 1 %
EOS (ABSOLUTE): 0 10*3/uL (ref 0.0–0.4)
Eos: 0 %
Hematocrit: 39.8 % (ref 34.0–46.6)
Hemoglobin: 12.7 g/dL (ref 11.1–15.9)
Immature Grans (Abs): 0 10*3/uL (ref 0.0–0.1)
Immature Granulocytes: 0 %
Lymphocytes Absolute: 1.1 10*3/uL (ref 0.7–3.1)
Lymphs: 10 %
MCH: 28.5 pg (ref 26.6–33.0)
MCHC: 31.9 g/dL (ref 31.5–35.7)
MCV: 89 fL (ref 79–97)
Monocytes Absolute: 0.1 10*3/uL (ref 0.1–0.9)
Monocytes: 1 %
Neutrophils Absolute: 10.1 10*3/uL — ABNORMAL HIGH (ref 1.4–7.0)
Neutrophils: 88 %
Platelets: 362 10*3/uL (ref 150–450)
RBC: 4.45 x10E6/uL (ref 3.77–5.28)
RDW: 12.7 % (ref 11.7–15.4)
WBC: 11.4 10*3/uL — ABNORMAL HIGH (ref 3.4–10.8)

## 2021-08-17 LAB — THYROID PANEL WITH TSH
Free Thyroxine Index: 2 (ref 1.2–4.9)
T3 Uptake Ratio: 25 % (ref 24–39)
T4, Total: 7.9 ug/dL (ref 4.5–12.0)
TSH: 4.02 u[IU]/mL (ref 0.450–4.500)

## 2021-08-21 ENCOUNTER — Other Ambulatory Visit (HOSPITAL_COMMUNITY): Payer: Self-pay

## 2021-08-21 MED ORDER — ERYTHROMYCIN 5 MG/GM OP OINT
TOPICAL_OINTMENT | Freq: Two times a day (BID) | OPHTHALMIC | 0 refills | Status: DC
  Filled 2021-08-21: qty 3.5, 14d supply, fill #0

## 2021-08-24 ENCOUNTER — Other Ambulatory Visit (HOSPITAL_COMMUNITY): Payer: Self-pay

## 2021-08-24 ENCOUNTER — Other Ambulatory Visit: Payer: 59

## 2021-08-24 MED ORDER — SULFAMETHOXAZOLE-TRIMETHOPRIM 800-160 MG PO TABS
1.0000 | ORAL_TABLET | ORAL | 0 refills | Status: DC
  Filled 2021-08-24: qty 12, 28d supply, fill #0

## 2021-08-24 MED ORDER — PREDNISONE 20 MG PO TABS
ORAL_TABLET | ORAL | 0 refills | Status: AC
  Filled 2021-08-24 (×2): qty 61, 30d supply, fill #0

## 2021-08-24 MED ORDER — DORZOLAMIDE HCL-TIMOLOL MAL 2-0.5 % OP SOLN
1.0000 [drp] | Freq: Two times a day (BID) | OPHTHALMIC | 11 refills | Status: DC
  Filled 2021-08-24: qty 10, 50d supply, fill #0

## 2021-08-24 MED ORDER — PREDNISONE 5 MG PO TABS
ORAL_TABLET | ORAL | 0 refills | Status: AC
  Filled 2021-08-24: qty 10.5, 14d supply, fill #0
  Filled 2021-09-25: qty 11, 14d supply, fill #0

## 2021-08-24 MED ORDER — PREDNISONE 20 MG PO TABS
ORAL_TABLET | ORAL | 0 refills | Status: DC
  Filled 2021-08-24: qty 43, 25d supply, fill #0

## 2021-09-25 ENCOUNTER — Other Ambulatory Visit: Payer: Self-pay | Admitting: *Deleted

## 2021-09-25 ENCOUNTER — Other Ambulatory Visit (HOSPITAL_COMMUNITY): Payer: Self-pay

## 2021-09-25 ENCOUNTER — Encounter: Payer: Self-pay | Admitting: Neurology

## 2021-09-25 ENCOUNTER — Other Ambulatory Visit: Payer: Self-pay | Admitting: Neurology

## 2021-09-25 MED ORDER — FLUOXETINE HCL 20 MG PO CAPS
60.0000 mg | ORAL_CAPSULE | Freq: Every day | ORAL | 3 refills | Status: DC
  Filled 2021-09-25: qty 270, 90d supply, fill #0
  Filled 2022-01-30: qty 270, 90d supply, fill #1
  Filled 2022-05-19: qty 270, 90d supply, fill #2

## 2021-09-26 ENCOUNTER — Other Ambulatory Visit (INDEPENDENT_AMBULATORY_CARE_PROVIDER_SITE_OTHER): Payer: Commercial Managed Care - HMO

## 2021-09-26 DIAGNOSIS — E890 Postprocedural hypothyroidism: Secondary | ICD-10-CM

## 2021-09-26 DIAGNOSIS — E0500 Thyrotoxicosis with diffuse goiter without thyrotoxic crisis or storm: Secondary | ICD-10-CM

## 2021-09-26 DIAGNOSIS — E039 Hypothyroidism, unspecified: Secondary | ICD-10-CM

## 2021-09-26 LAB — T4, FREE: Free T4: 0.45 ng/dL — ABNORMAL LOW (ref 0.60–1.60)

## 2021-09-26 LAB — TSH: TSH: 55.01 u[IU]/mL — ABNORMAL HIGH (ref 0.35–5.50)

## 2021-09-27 ENCOUNTER — Telehealth (HOSPITAL_BASED_OUTPATIENT_CLINIC_OR_DEPARTMENT_OTHER): Payer: Self-pay | Admitting: Psychiatry

## 2021-09-27 ENCOUNTER — Encounter (HOSPITAL_COMMUNITY): Payer: Self-pay | Admitting: Psychiatry

## 2021-09-27 ENCOUNTER — Other Ambulatory Visit (HOSPITAL_COMMUNITY): Payer: Self-pay

## 2021-09-27 VITALS — Wt 160.0 lb

## 2021-09-27 DIAGNOSIS — F330 Major depressive disorder, recurrent, mild: Secondary | ICD-10-CM

## 2021-09-27 DIAGNOSIS — F900 Attention-deficit hyperactivity disorder, predominantly inattentive type: Secondary | ICD-10-CM

## 2021-09-27 DIAGNOSIS — F419 Anxiety disorder, unspecified: Secondary | ICD-10-CM

## 2021-09-27 MED ORDER — LORAZEPAM 0.5 MG PO TABS
0.5000 mg | ORAL_TABLET | ORAL | 1 refills | Status: DC | PRN
  Filled 2021-09-27: qty 15, 15d supply, fill #0

## 2021-09-27 MED ORDER — LISDEXAMFETAMINE DIMESYLATE 40 MG PO CAPS
40.0000 mg | ORAL_CAPSULE | Freq: Every morning | ORAL | 0 refills | Status: DC
  Filled 2021-09-27: qty 90, 90d supply, fill #0

## 2021-09-27 NOTE — Progress Notes (Signed)
Virtual Visit via Telephone Note ? ?I connected with [name] on 09/27/21 at  3:00 PM EDT by telephone and verified that I am speaking with the correct person using two identifiers. ? ?Location: ?Patient: Home ?Provider: Home Office ?  ?I discussed the limitations, risks, security and privacy concerns of performing an evaluation and management service by telephone and the availability of in person appointments. I also discussed with the patient that there may be a patient responsible charge related to this service. The patient expressed understanding and agreed to proceed. ? ? ?History of Present Illness: ?Patient is evaluated by phone session.  She was last seen on October.  Patient told past few months was very difficult.  Her vision continued to get worse and few months ago she had severe brain fog that she had mistakes at work.  She was given option to take either early retirement or quit the job.  Patient had to decide to take early retirement after working for more than 40 years.  She is very sad but she has to make that decision because she could not function.  She had a good support because her neighbors and friends are available if she needs something.  She is able to drive only during the day but she has cut down significantly because her vision is not as good.  She admitted anxiety and nervousness about her general health but she is managing.  She took remaining Ativan which was given more than a year ago but she is now out.  She is seeing a retina specialist at [facility].  Both of her son lives in [place].  She did go to [place] for her son's wedding few months ago.  She is hoping to find the job because she is not getting any income.  She understand there is limitation because of vision and brain fog but hoping to find a suitable job.  She admitted to cut down her Vyvanse and she was not taking every day because she do not have enough pills.  She like to go back on Vyvanse because it did help her  attention and focus.  She is getting Prozac from her neurologist.  She admitted excessive weight gain because taking steroids and she still had to take 2 more weeks.  She is not sure what causes her brain fog and she is frustrated but managing the current situation.  Patient has MS.  She denies any suicidal thoughts, paranoia, hallucination.  She denies drinking or using any illegal substances.  She lives by herself but she has good support from friends and neighbors. ? ?Past Psychiatric History:  ?H/O depression and ADD.  Tried Zoloft, Focalin, Ritalin and Strattera.  No history of inpatient or any suicidal attempt. ?  ? ?Psychiatric Specialty Exam: ?Physical Exam  ?Review of Systems  ?Weight 160 lb (72.6 kg).There is no height or weight on file to calculate BMI.  ?General Appearance: NA  ?Eye Contact:  NA  ?Speech:  Slow  ?Volume:  Decreased  ?Mood:  Anxious and Dysphoric  ?Affect:  NA  ?Thought Process:  Descriptions of Associations: Intact  ?Orientation:  Full (Time, Place, and Person)  ?Thought Content:  Rumination  ?Suicidal Thoughts:  No  ?Homicidal Thoughts:  No  ?Memory:  Immediate;   Good ?Recent;   Good ?Remote;   Good  ?Judgement:  Intact  ?Insight:  Present  ?Psychomotor Activity:  NA  ?Concentration:  Concentration: Good and Attention Span: Fair  ?Recall:  Good  ?Fund  of Knowledge:  Good  ?Language:  Good  ?Akathisia:  No  ?Handed:  Right  ?AIMS (if indicated):     ?Assets:  Communication Skills ?Desire for Improvement ?Housing ?Social Support  ?ADL's:  Intact  ?Cognition:  WNL  ?Sleep:   fair  ? ? ? ? ?Assessment and Plan: ?Major depressive disorder, recurrent.  Anxiety.  Attention deficit disorder, inattentive type. ? ?I review her current medication, psychosocial stressors.  Patient has to take early retirement because of health condition.  She was having brain fog and decreased vision.  We talk about if not working may reduce the Vyvanse dose.  She agreed but like to go back to previous dose if she  able to find a job that suits her.  She also like to go back on Ativan to take as needed because her last prescription was more than a year ago.  I offer therapy but patient not interested.  We will reduce Vyvanse 40 mg daily and continue Ativan 0.5 mg to take as needed if needed for anxiety.  Recommended to call us back if she has any question or any concern.  Follow-up in 3 months.  Patient is following [facility] eye specialist for diplopia and vision issues. ? ?Follow Up Instructions: ? ?  ?I discussed the assessment and treatment plan with the patient. The patient was provided an opportunity to ask questions and all were answered. The patient agreed with the plan and demonstrated an understanding of the instructions. ?  ?The patient was advised to call back or seek an in-person evaluation if the symptoms worsen or if the condition fails to improve as anticipated. ? ?Collaboration of Care: Primary Care Provider AEB notes are available in epic to review. ? ?Patient/Guardian was advised Release of Information must be obtained prior to any record release in order to collaborate their care with an outside provider. Patient/Guardian was advised if they have not already done so to contact the registration department to sign all necessary forms in order for [place] to release information regarding their care.  ? ?Consent: Patient/Guardian gives verbal consent for treatment and assignment of benefits for services provided during this visit. Patient/Guardian expressed understanding and agreed to proceed.   ? ?I provided 24 minutes of non-face-to-face time during this encounter. ? ? ?Kathlee Nations, MD  ?

## 2021-09-28 ENCOUNTER — Encounter: Payer: Self-pay | Admitting: Endocrinology

## 2021-09-28 ENCOUNTER — Other Ambulatory Visit (HOSPITAL_COMMUNITY): Payer: Self-pay

## 2021-09-28 ENCOUNTER — Ambulatory Visit (INDEPENDENT_AMBULATORY_CARE_PROVIDER_SITE_OTHER): Payer: Commercial Managed Care - HMO | Admitting: Endocrinology

## 2021-09-28 VITALS — BP 118/72 | HR 68 | Ht 67.0 in | Wt 166.6 lb

## 2021-09-28 DIAGNOSIS — E890 Postprocedural hypothyroidism: Secondary | ICD-10-CM

## 2021-09-28 NOTE — Progress Notes (Signed)
Patient ID: [name], female   DOB: 11-05-1960, 61 y.o.   MRN: [id number] ? ?                                                                                              ? ?             ? ?Reason for Appointment:  Hyperthyroidism, follow-up visit ? ? ?Chief complaint: Follow-up ? ? History of Present Illness:  ? ?Baseline history: ?4 weeks prior to her initial visit she had symptoms of palpitations, dizziness, feeling excessively warm/sweaty, shakiness of her legs, and weight loss ?The patient had last 15 about  lbs since these symptoms started ?Apparently she had gastroenteritis-like symptoms for about 5 days right after her Covid booster shot on 01/20/2020 also associated with significant malaise and weakness ?She was also feeling like her heart was racing and her breathing was difficult ?Despite her normal appetite she has lost weight ? ?Initially for treatment she was started on methimazole 10 mg twice daily ?With this she had resolution of her symptoms of palpitations, breathing difficulty, feeling warm and shakiness ?Her methimazole dose subsequently had been quite variable with her thyroid levels fluctuating between hyperthyroidism and hypothyroidism ? ?Recent history: ? ?Because of persistent hyperthyroidism along with Graves' ophthalmic disease as well as higher thyrotropin receptor antibody she was recommended thyroidectomy ?She had total thyroidectomy on 04/06/2021 ? ?Since about 2 weeks after her surgery she had been feeling much more fatigued ?She has gained weight progressively also ?At that time her levothyroxine was increased to 150 mcg as of 05/19/2021 because of continued hypothyroidism and high TSH of 45 ? ?The dose was further adjusted in 2/23 ? ?She is now on levothyroxine 175 mcg, 6-1/2 tablets a week ?She completely denies symptoms of fatigue, hair loss, trouble concentration or unusual cold intolerance, no weight gain similar to when she had TSH of 45 ? ?Although she appears to be  asymptomatic her TSH is markedly increased ?Does not appear to have missed her Synthroid doses or taking any interacting calcium or iron supplements at the same time and also taking her levothyroxine with water before breakfast ?Verified with the pharmacy that she has the correct dose ? ? ?Ophthalmopathy: She has had blurring of her vision, improved with recent steroid course and starting [place] again ? ? ?She is being followed by her ophthalmologist regularly ? ?Wt Readings from Last 3 Encounters:  ?09/28/21 166 lb 9.6 oz (75.6 kg)  ?08/16/21 166 lb (75.3 kg)  ?06/29/21 160 lb 6.4 oz (72.8 kg)  ? ? ?02/03/2020: Total T4 = 12.2  and total T3 321, high ? ?Lab Results  ?Component Value Date  ? FREET4 0.45 (L) 09/26/2021  ? FREET4 1.14 06/26/2021  ? FREET4 0.66 05/16/2021  ? T3FREE 2.9 05/16/2021  ? T3FREE 3.5 03/21/2021  ? T3FREE 3.0 01/31/2021  ? TSH 55.01 (H) 09/26/2021  ? TSH 4.020 08/16/2021  ? TSH 6.88 (H) 06/26/2021  ? ? ?Lab Results  ?Component Value Date  ? THYROTRECAB 9.16 (H) 03/21/2021  ? THYROTRECAB 10.40 (H) 12/21/2020  ? THYROTRECAB 17.80 (H) 09/23/2020  ? ? ? ?  Allergies as of 09/28/2021   ? ?   Reactions  ? Methylprednisolone Hives  ? Solu-medrol [methylprednisolone Sodium Succ] Hives, Itching  ? Wheezing (was getting with Ocrevus as well)  ? Cephalexin Hives, Itching  ? Ocrelizumab Palpitations, Other (See Comments)  ? (Ocrevus) Wheezing.  ? ?  ? ?  ?Medication List  ?  ? ?  ? Accurate as of Sep 28, 2021  3:30 PM. If you have any questions, ask your nurse or doctor.  ?  ?  ? ?  ? ?dorzolamide-timolol 22.3-6.8 MG/ML ophthalmic solution ?Commonly known as: Cosopt ?Instill 1 drop into affected eyes 2 (two) times daily. ?  ?ergocalciferol 1.25 MG (50000 UT) capsule ?Commonly known as: VITAMIN D2 ?Vitamin D2 1,250 mcg (50,000 unit) capsule ?  ?erythromycin ophthalmic ointment ?Apply a 1cm ribbon to both eyes at night time ?  ?erythromycin ophthalmic ointment ?Apply to both eyes 2 (two) times daily during the  day and at night ?  ?FLUoxetine 20 MG capsule ?Commonly known as: PROZAC ?Take 3 capsules (60 mg total) by mouth daily. ?  ?levothyroxine 175 MCG tablet ?Commonly known as: SYNTHROID ?Take 1 tablet (175 mcg total) by mouth daily except 1/2 pill on Sunday ?  ?lisdexamfetamine 40 MG capsule ?Commonly known as: VYVANSE ?Take 1 capsule (40 mg total) by mouth in the morning. ?  ?LORazepam 0.5 MG tablet ?Commonly known as: Ativan ?Take 1 tablet (0.5 mg total) by mouth as needed for anxiety. ?  ?Lubricant Eye Drops (PF) 0.1-0.3 % Soln ?Generic drug: Dextran 70-Hypromellose (PF) ?Place 1-2 drops into both eyes 3 (three) times daily as needed (dry/irritated eyes.). ?  ?naproxen sodium 220 MG tablet ?Commonly known as: ALEVE ?Take 440 mg by mouth 2 (two) times daily as needed (pain.). ?  ?ondansetron 8 MG disintegrating tablet ?Commonly known as: ZOFRAN-ODT ?ondansetron 8 mg disintegrating tablet ? PLACE 1 TABLET EVERY 8 HOURS UNDER THE TONGUE AS DIRECTED FOR 10 DAYS. ?  ?pantoprazole 40 MG tablet ?Commonly known as: PROTONIX ?TAKE 1 TABLET BY MOUTH ONCE A DAY AS DIRECTED ?  ?pantoprazole 40 MG tablet ?Commonly known as: PROTONIX ?Take 1 tablet (40 mg total) by mouth daily. ?  ?predniSONE 20 MG tablet ?Commonly known as: DELTASONE ?prednisone 20 mg tablet ? Take 4 tablets every day by oral route for 8 days. ?  ?predniSONE 5 MG tablet ?Commonly known as: DELTASONE ?Take1 tablet (5 mg total) daily for 7 days, THEN 0.5 tablets (2.5 mg total) daily for 7 days. (start after done with '20mg'$  tablets) ?Start taking on: August 24, 2021 ?  ?sulfamethoxazole-trimethoprim 800-160 MG tablet ?Commonly known as: BACTRIM DS ?Take 1 tablet by mouth 3 (three) times a week for 4 weeks. ?  ?timolol 0.5 % ophthalmic solution ?Commonly known as: TIMOPTIC ?Place 1 drop into affected eye(s) 2 (two) times daily. ?  ?tobramycin-dexamethasone ophthalmic solution ?Commonly known as: TobraDex ?Place 1 drop into the right eye 3 (three) times daily for 1  week, then stop ?  ?Vumerity 231 MG Cpdr ?Generic drug: Diroximel Fumarate ?TAKE 2 CAPSULES BY MOUTH TWICE A DAY ?  ?Xiidra 5 % Soln ?Generic drug: Lifitegrast ?Place 1 drop into both eyes 2 (two) times daily approximately 12 hours apart ?  ?Xiidra 5 % Soln ?Generic drug: Lifitegrast ?Place 1 drop into both eyes 2 (two) times daily about 12 hours apart ?  ? ?  ?     ? ?Past Medical History:  ?Diagnosis Date  ? ADD (attention deficit disorder with hyperactivity)   ?  Anxiety   ? Arthritis   ? Cancer [facility])   ? Skin cancer- basal cell, squamous cell  ? Cervical dysplasia   ? Family history of adverse reaction to anesthesia   ? mother due to mitral valve regergitation  ? GERD (gastroesophageal reflux disease)   ? Headache   ? migraine  ? Hepatitis   ? Pt states Core and surface antibioties for Hepatitis whens he was 21  ? Hyperthyroidism   ? Mitral valve prolapse   ? Multiple sclerosis ([place])   ? Multiple sclerosis ([place])   ? Pneumonia   ? Tendonitis in rigtht wrist   ? ? ?Past Surgical History:  ?Procedure Laterality Date  ? ABDOMINAL HYSTERECTOMY  2004  ? ovaries retained  ? BACK SURGERY    ? heart ablation    ? 15 yrs ago for a fib by Dr.Klein, everything ok  ? KNEE ARTHROSCOPY WITH SUBCHONDROPLASTY Left 11/13/2017  ? Procedure: LEFT KNEE ARTHROSCOPY WITH SUBCHONDROPLASTY, PARTIAL MEDIAL MENISCECTOMY, SYNOVECTOMY;  Surgeon: Leandrew Koyanagi, MD;  Location: [place];  Service: Orthopedics;  Laterality: Left;  ? THYROIDECTOMY N/A 04/06/2021  ? Procedure: TOTAL THYROIDECTOMY;  Surgeon: Armandina Gemma, MD;  [facility];  Service: General;  Laterality: N/A;  ? TUBAL LIGATION    ? ? ?Family History  ?Problem Relation Age of Onset  ? Depression Mother   ? Stroke Mother   ? Heart disease Mother   ? Dementia Mother   ? Hypothyroidism Mother   ? Stroke Father   ? Heart disease Father   ? Parkinson's disease Maternal Grandmother   ? Parkinson's disease Maternal Grandfather   ? Heart disease Paternal Grandmother   ?  COPD Paternal Grandfather   ? Healthy Brother   ? Cancer Neg Hx   ? Diabetes Neg Hx   ? Breast cancer Neg Hx   ? ? ?Social History:  reports that she has been smoking cigarettes. She has been smoking an average

## 2021-09-29 ENCOUNTER — Telehealth (HOSPITAL_COMMUNITY): Payer: Self-pay | Admitting: *Deleted

## 2021-09-29 LAB — TSH: TSH: 66.28 u[IU]/mL — ABNORMAL HIGH (ref 0.35–5.50)

## 2021-09-29 NOTE — Telephone Encounter (Signed)
PA FOR VYVANSE 40 MG CAPSULES SUBMITTED TO [facility] VIA [facility] MY MED[facility].  ? ?PA CASE ID # [id number] ? ?AWAITING DETERMINATION. ?

## 2021-10-02 ENCOUNTER — Telehealth (HOSPITAL_COMMUNITY): Payer: Self-pay | Admitting: *Deleted

## 2021-10-02 NOTE — Telephone Encounter (Signed)
PA FOR VYVANSE 40 MG APPROVED. ? ?PA CASE ID # [id number] ? ?APPROVED FROM 5/13 TO 09/29/22. ?

## 2021-10-03 ENCOUNTER — Encounter: Payer: Self-pay | Admitting: Endocrinology

## 2021-10-03 NOTE — Progress Notes (Signed)
TSH is still high, increase her levothyroxine to 1-1/2 tablets of the 175 mcg daily

## 2021-11-03 ENCOUNTER — Telehealth (HOSPITAL_COMMUNITY): Payer: Self-pay | Admitting: *Deleted

## 2021-11-03 ENCOUNTER — Other Ambulatory Visit (HOSPITAL_COMMUNITY): Payer: Self-pay

## 2021-11-03 DIAGNOSIS — F900 Attention-deficit hyperactivity disorder, predominantly inattentive type: Secondary | ICD-10-CM

## 2021-11-03 DIAGNOSIS — F330 Major depressive disorder, recurrent, mild: Secondary | ICD-10-CM

## 2021-11-03 DIAGNOSIS — F419 Anxiety disorder, unspecified: Secondary | ICD-10-CM

## 2021-11-03 MED ORDER — LISDEXAMFETAMINE DIMESYLATE 50 MG PO CAPS
50.0000 mg | ORAL_CAPSULE | Freq: Every day | ORAL | 0 refills | Status: DC
  Filled 2021-11-03 – 2021-11-20 (×3): qty 30, 30d supply, fill #0

## 2021-11-03 NOTE — Telephone Encounter (Signed)
Pt presented to office to drop off [place] for the Vyvanse. Forms completed and faxed to [facility]. Pt also asked now that she's working again, part time, she requests to increase  Vyvanse back to 50 mg. FYI. Next appointment scheduled for 12/27/21.

## 2021-11-03 NOTE — Telephone Encounter (Signed)
I can try for 30 days. Sent to [employer].

## 2021-11-06 ENCOUNTER — Telehealth (HOSPITAL_COMMUNITY): Payer: Self-pay | Admitting: *Deleted

## 2021-11-06 NOTE — Telephone Encounter (Signed)
PA FOR VYVANSE 50 MG CAPS SUBMITTED TO [facility]IA [facility]OVER MY MED[facility].   PA CASE ID # [id number].  AWAITING APPROVAL.

## 2021-11-09 ENCOUNTER — Other Ambulatory Visit (HOSPITAL_COMMUNITY): Payer: Self-pay | Admitting: *Deleted

## 2021-11-10 ENCOUNTER — Telehealth (HOSPITAL_COMMUNITY): Payer: Self-pay | Admitting: *Deleted

## 2021-11-10 NOTE — Telephone Encounter (Signed)
PA FOR VYVANSE 50 MG CAPS HAS BEEN DENIED BY [employer] DUE TO PT NOT HAVING TRIED AND FAILED ALL 5 MEDS. PT HAS TRIED AND FAILED 3.   WRITER WILL APPEA.

## 2021-11-14 ENCOUNTER — Other Ambulatory Visit (HOSPITAL_COMMUNITY): Payer: Self-pay

## 2021-11-16 ENCOUNTER — Other Ambulatory Visit (HOSPITAL_COMMUNITY): Payer: Self-pay

## 2021-11-17 ENCOUNTER — Other Ambulatory Visit (HOSPITAL_COMMUNITY): Payer: Self-pay

## 2021-11-20 ENCOUNTER — Other Ambulatory Visit (HOSPITAL_COMMUNITY): Payer: Self-pay

## 2021-11-27 ENCOUNTER — Other Ambulatory Visit: Payer: Self-pay | Admitting: Endocrinology

## 2021-11-27 ENCOUNTER — Other Ambulatory Visit: Payer: Commercial Managed Care - HMO

## 2021-11-27 DIAGNOSIS — E890 Postprocedural hypothyroidism: Secondary | ICD-10-CM

## 2021-11-28 ENCOUNTER — Ambulatory Visit: Payer: Commercial Managed Care - HMO | Admitting: Endocrinology

## 2021-12-27 ENCOUNTER — Encounter (HOSPITAL_COMMUNITY): Payer: Self-pay | Admitting: Psychiatry

## 2021-12-27 ENCOUNTER — Other Ambulatory Visit (HOSPITAL_COMMUNITY): Payer: Self-pay

## 2021-12-27 ENCOUNTER — Telehealth (HOSPITAL_BASED_OUTPATIENT_CLINIC_OR_DEPARTMENT_OTHER): Payer: Commercial Managed Care - HMO | Admitting: Psychiatry

## 2021-12-27 VITALS — Wt 166.0 lb

## 2021-12-27 DIAGNOSIS — F330 Major depressive disorder, recurrent, mild: Secondary | ICD-10-CM | POA: Diagnosis not present

## 2021-12-27 DIAGNOSIS — F900 Attention-deficit hyperactivity disorder, predominantly inattentive type: Secondary | ICD-10-CM | POA: Diagnosis not present

## 2021-12-27 DIAGNOSIS — F419 Anxiety disorder, unspecified: Secondary | ICD-10-CM

## 2021-12-27 MED ORDER — LISDEXAMFETAMINE DIMESYLATE 50 MG PO CAPS
50.0000 mg | ORAL_CAPSULE | Freq: Every day | ORAL | 0 refills | Status: DC
  Filled 2021-12-27: qty 30, 30d supply, fill #0
  Filled 2022-01-30: qty 30, 30d supply, fill #1

## 2021-12-27 NOTE — Progress Notes (Signed)
Virtual Visit via Telephone Note  I connected with [name] on 12/27/21 at  2:40 PM EDT by telephone and verified that I am speaking with the correct person using two identifiers.  Location: Patient: Work  Provider: Biomedical scientist   I discussed the limitations, risks, security and privacy concerns of performing an evaluation and management service by telephone and the availability of in person appointments. I also discussed with the patient that there may be a patient responsible charge related to this service. The patient expressed understanding and agreed to proceed.   History of Present Illness: Patient is evaluated by phone session.  She is working part-time at [employer] office and so far no major issues.  She has vision issues but she usually drives during the daytime.  She is taking Vyvanse 50 mg every day that helps her focus, attention, concentration.  She also getting Prozac from her neurologist.  We have provided Ativan 15 tablets but she has required only twice when she was very nervous or anxious.  She still have moments of brain fog and fatigue but they does not last long.  Patient told recently her grandson was born who is now 64 weeks.  She visited him in [place].  Patient has 2 son who lives in [place].  Patient reported her appetite is okay.  Energy level is good.  She has no tremor or shakes or any EPS.  She denies any crying spells or any feeling of hopelessness or worthlessness.  She wants to keep the current medication.    Past Psychiatric History:  H/O depression and ADD.  Tried Zoloft, Focalin, Ritalin and Strattera.  No history of inpatient or any suicidal attempt.  Psychiatric Specialty Exam: Physical Exam  Review of Systems  Weight 166 lb (75.3 kg).There is no height or weight on file to calculate BMI.  General Appearance: NA  Eye Contact:  NA  Speech:  Clear and Coherent and Normal Rate  Volume:  Normal  Mood:  Euthymic  Affect:  NA  Thought  Process:  Goal Directed  Orientation:  Full (Time, Place, and Person)  Thought Content:  WDL  Suicidal Thoughts:  No  Homicidal Thoughts:  No  Memory:  Immediate;   Good Recent;   Good Remote;   Good  Judgement:  Good  Insight:  Present  Psychomotor Activity:  NA  Concentration:  Concentration: Good and Attention Span: Good  Recall:  Good  Fund of Knowledge:  Good  Language:  Good  Akathisia:  No  Handed:  Right  AIMS (if indicated):     Assets:  Communication Skills Desire for Improvement Housing Resilience Social Support  ADL's:  Intact  Cognition:  WNL  Sleep:   ok      Assessment and Plan: Major depressive disorder, recurrent.  Anxiety.  Attention deficit disorder, recurrent.  Patient doing better on her current medication.  Continue Vyvanse 50 mg which is helping her now.  We have reduced the dose 40 mg but it did not work.  She does not need a new prescription of Ativan as patient has left over.  She is getting Prozac from her neurologist.  Discussed medication side effects and benefits.  Recommended to call us back if she has any question or any concern.  Follow-up in 3 months.  Follow Up Instructions:    I discussed the assessment and treatment plan with the patient. The patient was provided an opportunity to ask questions and all were answered. The patient agreed  with the plan and demonstrated an understanding of the instructions.   The patient was advised to call back or seek an in-person evaluation if the symptoms worsen or if the condition fails to improve as anticipated.  Collaboration of Care: Primary Care Provider AEB notes are available in epic to review.  Patient/Guardian was advised Release of Information must be obtained prior to any record release in order to collaborate their care with an outside provider. Patient/Guardian was advised if they have not already done so to contact the registration department to sign all necessary forms in order for [place] to  release information regarding their care.   Consent: Patient/Guardian gives verbal consent for treatment and assignment of benefits for services provided during this visit. Patient/Guardian expressed understanding and agreed to proceed.    I provided 18 minutes of non-face-to-face time during this encounter.   Kathlee Nations, MD

## 2022-01-05 ENCOUNTER — Other Ambulatory Visit (INDEPENDENT_AMBULATORY_CARE_PROVIDER_SITE_OTHER): Payer: Commercial Managed Care - HMO

## 2022-01-05 DIAGNOSIS — E890 Postprocedural hypothyroidism: Secondary | ICD-10-CM

## 2022-01-05 LAB — TSH: TSH: 0.26 u[IU]/mL — ABNORMAL LOW (ref 0.35–5.50)

## 2022-01-05 LAB — T4, FREE: Free T4: 1.82 ng/dL — ABNORMAL HIGH (ref 0.60–1.60)

## 2022-01-09 ENCOUNTER — Encounter: Payer: Self-pay | Admitting: Endocrinology

## 2022-01-09 ENCOUNTER — Other Ambulatory Visit (HOSPITAL_COMMUNITY): Payer: Self-pay

## 2022-01-09 ENCOUNTER — Ambulatory Visit: Payer: Commercial Managed Care - HMO | Admitting: Endocrinology

## 2022-01-09 ENCOUNTER — Other Ambulatory Visit: Payer: Self-pay

## 2022-01-09 VITALS — BP 114/76 | HR 86 | Ht 67.0 in | Wt 162.2 lb

## 2022-01-09 DIAGNOSIS — E890 Postprocedural hypothyroidism: Secondary | ICD-10-CM

## 2022-01-09 MED ORDER — LEVOTHYROXINE SODIUM 125 MCG PO TABS
250.0000 ug | ORAL_TABLET | Freq: Every day | ORAL | 3 refills | Status: DC
  Filled 2022-01-09: qty 60, 30d supply, fill #0

## 2022-01-09 MED ORDER — LEVOTHYROXINE SODIUM 125 MCG PO TABS
250.0000 ug | ORAL_TABLET | Freq: Every day | ORAL | 3 refills | Status: DC
  Filled 2022-01-09: qty 60, 30d supply, fill #0
  Filled 2022-02-23: qty 60, 30d supply, fill #1
  Filled 2022-04-20: qty 60, 30d supply, fill #2

## 2022-01-09 NOTE — Progress Notes (Signed)
Patient ID: [name], female   DOB: [birth year], 61 y.o.   MRN: [id number]                                                                                                               Reason for Appointment:  Hyperthyroidism, follow-up visit   Chief complaint: Follow-up   History of Present Illness:   Baseline history: 4 weeks prior to her initial visit she had symptoms of palpitations, dizziness, feeling excessively warm/sweaty, shakiness of her legs, and weight loss The patient had last 15 about  lbs since these symptoms started Apparently she had gastroenteritis-like symptoms for about 5 days right after her Covid booster shot on 01/20/2020 also associated with significant malaise and weakness She was also feeling like her heart was racing and her breathing was difficult Despite her normal appetite she has lost weight  Initially for treatment she was started on methimazole 10 mg twice daily With this she had resolution of her symptoms of palpitations, breathing difficulty, feeling warm and shakiness Her methimazole dose subsequently had been quite variable with her thyroid levels fluctuating between hyperthyroidism and hypothyroidism  Recent history:  Because of persistent hyperthyroidism along with Graves' ophthalmic disease as well as higher thyrotropin receptor antibody she was recommended thyroidectomy She had total thyroidectomy on 04/06/2021  Since surgery her levothyroxine dosage has been fluctuating markedly and needing frequent adjustment  Previous hypothyroid symptoms had been fatigue, hair loss, trouble concentration or unusual cold intolerance, weight gain   In 5/23 she seemed to be asymptomatic but her TSH was markedly increased This is without change in her compliance or getting an incorrect dose She was told to take 1 and half tablets of levothyroxine 175 mcg daily and follow-up in 6 weeks but she is now coming back 3 months later She did not feel any different  after adjusting the dose last time  Currently she feels fairly good without any unusual fatigue or palpitations She consistently takes her levothyroxine on empty stomach and frequently will take it during the night when she is going to the bathroom TSH is now close to normal although free T4 is unusually high She does not think she has taken any vitamins containing biotin  Ophthalmopathy: She has had blurring of her vision, improved with steroid course and taking Netty Starring again  She is being followed by her ophthalmologist regularly but is still having difficulty with diplopia  Wt Readings from Last 3 Encounters:  01/09/22 162 lb 3.2 oz (73.6 kg)  09/28/21 166 lb 9.6 oz (75.6 kg)  08/16/21 166 lb (75.3 kg)     Lab Results  Component Value Date   FREET4 1.82 (H) 01/05/2022   FREET4 0.45 (L) 09/26/2021   FREET4 1.14 06/26/2021   T3FREE 2.9 05/16/2021   T3FREE 3.5 03/21/2021   T3FREE 3.0 01/31/2021   TSH 0.26 (L) 01/05/2022   TSH 66.28 (H) 09/28/2021   TSH 55.01 (H) 09/26/2021    Lab Results  Component Value Date   THYROTRECAB 9.16 (H) 03/21/2021  THYROTRECAB 10.40 (H) 12/21/2020   THYROTRECAB 17.80 (H) 09/23/2020     Allergies as of 01/09/2022       Reactions   Methylprednisolone Hives   Solu-medrol [methylprednisolone Sodium Succ] Hives, Itching   Wheezing (was getting with Ocrevus as well)   Cephalexin Hives, Itching   Ocrelizumab Palpitations, Other (See Comments)   (Ocrevus) Wheezing.        Medication List        Accurate as of January 09, 2022  1:34 PM. If you have any questions, ask your nurse or doctor.          dorzolamide-timolol 22.3-6.8 MG/ML ophthalmic solution Commonly known as: Cosopt Instill 1 drop into affected eyes 2 (two) times daily.   ergocalciferol 1.25 MG (50000 UT) capsule Commonly known as: VITAMIN D2 Vitamin D2 1,250 mcg (50,000 unit) capsule   erythromycin ophthalmic ointment Apply a 1cm ribbon to both eyes at night time    erythromycin ophthalmic ointment Apply to both eyes 2 (two) times daily during the day and at night   FLUoxetine 20 MG capsule Commonly known as: PROZAC Take 3 capsules (60 mg total) by mouth daily.   levothyroxine 175 MCG tablet Commonly known as: SYNTHROID Take 1 tablet (175 mcg total) by mouth daily except 1/2 pill on Sunday   LORazepam 0.5 MG tablet Commonly known as: Ativan Take 1 tablet (0.5 mg total) by mouth as needed for anxiety.   Lubricant Eye Drops (PF) 0.1-0.3 % Soln Generic drug: Dextran 70-Hypromellose (PF) Place 1-2 drops into both eyes 3 (three) times daily as needed (dry/irritated eyes.).   naproxen sodium 220 MG tablet Commonly known as: ALEVE Take 440 mg by mouth 2 (two) times daily as needed (pain.).   ondansetron 8 MG disintegrating tablet Commonly known as: ZOFRAN-ODT ondansetron 8 mg disintegrating tablet  PLACE 1 TABLET EVERY 8 HOURS UNDER THE TONGUE AS DIRECTED FOR 10 DAYS.   pantoprazole 40 MG tablet Commonly known as: PROTONIX TAKE 1 TABLET BY MOUTH ONCE A DAY AS DIRECTED   pantoprazole 40 MG tablet Commonly known as: PROTONIX Take 1 tablet (40 mg total) by mouth daily.   predniSONE 20 MG tablet Commonly known as: DELTASONE prednisone 20 mg tablet  Take 4 tablets every day by oral route for 8 days.   sulfamethoxazole-trimethoprim 800-160 MG tablet Commonly known as: BACTRIM DS Take 1 tablet by mouth 3 (three) times a week for 4 weeks.   timolol 0.5 % ophthalmic solution Commonly known as: TIMOPTIC Place 1 drop into affected eye(s) 2 (two) times daily.   Vumerity 231 MG Cpdr Generic drug: Diroximel Fumarate TAKE 2 CAPSULES BY MOUTH TWICE A DAY   Vyvanse 50 MG capsule Generic drug: lisdexamfetamine Take 1 capsule (50 mg total) by mouth daily.   Xiidra 5 % Soln Generic drug: Lifitegrast Place 1 drop into both eyes 2 (two) times daily approximately 12 hours apart   Xiidra 5 % Soln Generic drug: Lifitegrast Place 1 drop into both  eyes 2 (two) times daily about 12 hours apart            Past Medical History:  Diagnosis Date   ADD (attention deficit disorder with hyperactivity)    Anxiety    Arthritis    Cancer ([facility])    Skin cancer- basal cell, squamous cell   Cervical dysplasia    Family history of adverse reaction to anesthesia    mother due to mitral valve regergitation   GERD (gastroesophageal reflux disease)    Headache  migraine   Hepatitis    Pt states [facility] and [facility] antibioties for Hepatitis whens he was 18   Hyperthyroidism    Mitral valve prolapse    Multiple sclerosis ([facility])    Multiple sclerosis ([facility])    Pneumonia    Tendonitis in rigtht wrist     Past Surgical History:  Procedure Laterality Date   ABDOMINAL HYSTERECTOMY  2004   ovaries retained   BACK SURGERY     heart ablation     15 yrs ago for a fib by Dr.Klein, everything ok   KNEE ARTHROSCOPY WITH SUBCHONDROPLASTY Left 11/13/2017   Procedure: LEFT KNEE ARTHROSCOPY WITH SUBCHONDROPLASTY, PARTIAL MEDIAL MENISCECTOMY, SYNOVECTOMY;  Surgeon: Leandrew Koyanagi, MD;  Location: [facility];  Service: Orthopedics;  Laterality: Left;   THYROIDECTOMY N/A 04/06/2021   Procedure: TOTAL THYROIDECTOMY;  Surgeon: Armandina Gemma, MD;  Location: [facility];  Service: General;  Laterality: N/A;   TUBAL LIGATION      Family History  Problem Relation Age of Onset   Depression Mother    Stroke Mother    Heart disease Mother    Dementia Mother    Hypothyroidism Mother    Stroke Father    Heart disease Father    Parkinson's disease Maternal Grandmother    Parkinson's disease Maternal Grandfather    Heart disease Paternal Grandmother    COPD Paternal Grandfather    Healthy Brother    Cancer Neg Hx    Diabetes Neg Hx    Breast cancer Neg Hx     Social History:  reports that she has been smoking cigarettes. She has been smoking an average of .25 packs per day. She has never used smokeless tobacco. She reports current alcohol use. She  reports that she does not use drugs.  Allergies:  Allergies  Allergen Reactions   Methylprednisolone Hives   Solu-Medrol [Methylprednisolone Sodium Succ] Hives and Itching    Wheezing (was getting with Ocrevus as well)   Cephalexin Hives and Itching   Ocrelizumab Palpitations and Other (See Comments)    (Ocrevus) Wheezing.     Review of Systems     Examination:   BP 114/76   Pulse 86   Ht '5\' 7"'$  (1.702 m)   Wt 162 lb 3.2 oz (73.6 kg)   SpO2 92%   BMI 25.40 kg/m     Assessment/Plan:  Hypothyroidism postsurgical with history of Graves' disease  She is now on levothyroxine 175 mcg, 1-1/2 tablets a day Unclear why she is requiring such a high dose of levothyroxine replacement given her weight of only 74 kg Also has had minimal symptomatology when she was significantly hypothyroid  TSH is now only slightly below normal and indicating that her dose is fairly close to maintenance levels Since her average dose daily is 262 mcg she will switch to 2 tablets of the 125 mcg levothyroxine daily and prescription was called in Follow-up TSH in 6 weeks  Office visit in 4 months  [name] 01/09/2022, 1:34 PM    Note: This office note was prepared with Dragon voice recognition system technology. Any transcriptional errors that result from this process are unintentional.

## 2022-01-30 ENCOUNTER — Telehealth (HOSPITAL_COMMUNITY): Payer: Self-pay | Admitting: *Deleted

## 2022-01-30 ENCOUNTER — Other Ambulatory Visit (HOSPITAL_COMMUNITY): Payer: Self-pay | Admitting: Psychiatry

## 2022-01-30 ENCOUNTER — Other Ambulatory Visit (HOSPITAL_COMMUNITY): Payer: Self-pay

## 2022-01-30 DIAGNOSIS — F900 Attention-deficit hyperactivity disorder, predominantly inattentive type: Secondary | ICD-10-CM

## 2022-01-30 DIAGNOSIS — F330 Major depressive disorder, recurrent, mild: Secondary | ICD-10-CM

## 2022-01-30 MED ORDER — LISDEXAMFETAMINE DIMESYLATE 50 MG PO CAPS
50.0000 mg | ORAL_CAPSULE | Freq: Every day | ORAL | 0 refills | Status: DC
  Filled 2022-01-30: qty 30, 30d supply, fill #0

## 2022-01-30 NOTE — Telephone Encounter (Signed)
Pt called requesting a refill of the Vyvanse since [facility] has been giving her #30 at a time, she will need a new script. Writer spoke with [facility] and they verified Rx expired on 12/27/21. Pt has an appointment scheduled for 03/28/22. Please review.

## 2022-01-30 NOTE — Telephone Encounter (Signed)
Will do 30 days this time. May be her insurance do not cover 90 days. Dent to pharmacy.

## 2022-02-14 ENCOUNTER — Other Ambulatory Visit (HOSPITAL_COMMUNITY): Payer: Self-pay

## 2022-02-23 ENCOUNTER — Other Ambulatory Visit: Payer: Self-pay

## 2022-02-23 ENCOUNTER — Other Ambulatory Visit (INDEPENDENT_AMBULATORY_CARE_PROVIDER_SITE_OTHER): Payer: Commercial Managed Care - HMO

## 2022-02-23 DIAGNOSIS — E890 Postprocedural hypothyroidism: Secondary | ICD-10-CM | POA: Diagnosis not present

## 2022-02-23 LAB — TSH: TSH: 0.19 u[IU]/mL — ABNORMAL LOW (ref 0.35–5.50)

## 2022-03-07 NOTE — Progress Notes (Signed)
Thyroid level is still high, instead of taking 2 pills of the 125 mcg daily she will take the 2 pills daily on 5 days a week and only 1 tablet the other 2 days of the week.  For example she can do the single tablets on Mondays and Thursdays

## 2022-03-14 ENCOUNTER — Other Ambulatory Visit (HOSPITAL_COMMUNITY): Payer: Self-pay

## 2022-03-14 MED ORDER — XIIDRA 5 % OP SOLN
1.0000 [drp] | Freq: Two times a day (BID) | OPHTHALMIC | 11 refills | Status: DC
  Filled 2022-03-14 – 2022-03-19 (×3): qty 60, 30d supply, fill #0

## 2022-03-14 MED ORDER — ERYTHROMYCIN 5 MG/GM OP OINT
1.0000 | TOPICAL_OINTMENT | Freq: Every evening | OPHTHALMIC | 11 refills | Status: DC
  Filled 2022-03-14: qty 3.5, 30d supply, fill #0
  Filled 2022-04-20: qty 3.5, 30d supply, fill #1
  Filled 2022-05-19: qty 3.5, 30d supply, fill #2

## 2022-03-15 ENCOUNTER — Encounter: Payer: Self-pay | Admitting: Neurology

## 2022-03-15 ENCOUNTER — Other Ambulatory Visit (HOSPITAL_COMMUNITY): Payer: Self-pay

## 2022-03-15 ENCOUNTER — Ambulatory Visit: Payer: Commercial Managed Care - HMO | Admitting: Neurology

## 2022-03-15 VITALS — BP 134/92 | HR 95 | Ht 67.5 in | Wt 164.0 lb

## 2022-03-15 DIAGNOSIS — R32 Unspecified urinary incontinence: Secondary | ICD-10-CM | POA: Diagnosis not present

## 2022-03-15 DIAGNOSIS — Z79899 Other long term (current) drug therapy: Secondary | ICD-10-CM

## 2022-03-15 DIAGNOSIS — H5789 Other specified disorders of eye and adnexa: Secondary | ICD-10-CM | POA: Diagnosis not present

## 2022-03-15 DIAGNOSIS — G35 Multiple sclerosis: Secondary | ICD-10-CM | POA: Diagnosis not present

## 2022-03-15 DIAGNOSIS — R350 Frequency of micturition: Secondary | ICD-10-CM

## 2022-03-15 DIAGNOSIS — G35D Multiple sclerosis, unspecified: Secondary | ICD-10-CM

## 2022-03-15 DIAGNOSIS — R5383 Other fatigue: Secondary | ICD-10-CM

## 2022-03-15 DIAGNOSIS — E0500 Thyrotoxicosis with diffuse goiter without thyrotoxic crisis or storm: Secondary | ICD-10-CM | POA: Diagnosis not present

## 2022-03-15 DIAGNOSIS — E079 Disorder of thyroid, unspecified: Secondary | ICD-10-CM

## 2022-03-15 MED ORDER — DIMETHYL FUMARATE 240 MG PO CPDR
240.0000 mg | DELAYED_RELEASE_CAPSULE | Freq: Two times a day (BID) | ORAL | 11 refills | Status: DC
  Filled 2022-03-15: qty 60, 30d supply, fill #0

## 2022-03-15 MED ORDER — DIMETHYL FUMARATE 120 MG PO CPDR
120.0000 mg | DELAYED_RELEASE_CAPSULE | Freq: Two times a day (BID) | ORAL | 0 refills | Status: DC
  Filled 2022-03-15: qty 14, 7d supply, fill #0

## 2022-03-15 NOTE — Progress Notes (Signed)
[facility]  PATIENT: [name] DOB: 07-28-1960     HISTORICAL  CHIEF COMPLAINT:  Chief Complaint  Patient presents with   Follow-up    Pt alone, rm 1. Presents today for follow up no DMT    HISTORY OF PRESENT ILLNESS:  [name] is a 61 y.o.woman with relapsing remitting multiple sclerosis.   Update 03/15/2022 She has Grave's disease (has Thyrotropin receptor Abs).  She is on methimazole and more recently was on Tepeza (had 8 infusion series) with benefit - less diplopia.   There is not a lot of data about additional courses if needed.   She has needed eyelid surgery.   Recent TSH was 6.88 (06/26/2021)and normal T4-free.   [name]  Recent Thyrotropin Receptor Ab was 10.40 (was 33 at max).     She had her thyroid removed and is on synthroid (now on 175 mcg 6 days a week and 12 that dose 1 day a week).  Vision is still impaired.     She sees ophthalmology (Dr. Sabino Dick)  and endocrinology regularly (Dr. Dwyane Dee)  She stopped Vumerity due to insurance issues.   No exacerbations > 10 year.  However, MRI 03/02/2019 showed one new lesion.  MRI 2021 showed no new lesion  She was allergic to Ocrevus and had multiple skin cancers (basal and squamous) while on Gilenya (still has had some on other medications).   She has no recent skin lesions.     She was never on [place].   Symptoms started after he Covid booster.  She feels she is doing well with her MS.   Gait is mildly of balanced she feels due to reduced depth perception more than MS.  No falls.    She has urinary incontinence..   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    She has extreme urgency with both and can't always get to a facility in time.   She wears pads.      She ihas some mental fog.   Vyvanse helps ADD.   She is working part-time 16-20 hours only.  She has some fatigue.  She sleeps poorly now due to eye pain.   Mood is doing ok on current fluoxetine   MS history: She was diagnosed with relapsing remitting MS around 2000 after presenting with numbness in the hands and feet.  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 She was admitted to the hospital. She then was diagnosed with MS and is to see Dr. Jacqulynn Cadet at [facility]. He started her on Betaseron.   She did fairly well on Betaseron. She did have several small exacerbations and received some steroids a couple times. Often the exacerbations would be associated with headache as well. About 4 years ago, she opted to switch to Gilenya to have oral therapy for the MS. She has done very well on that medication without any exacerbations.  Her June 2015 MRI did show one focus that was not present on her MRI from March 2010.  Due to several skin cancers, she switched to Copaxone in 2018 but had breakthrough activity.  She switched to [place] in 2019 but had difficulty tolerating the medication.  She switched to Vumerity in early 2020.  Recent imaging: MRI brain 03/02/2019 showed a "new lesion involving the right superior temporal gyrus consistent with progression of disease.    Increased  prominence of lesions in the subcortical white matter of the cingulate gyrus bilaterally, right greater than left. This could impact symptoms in the feet or lower extremities and is also consistent with progression of disease.  Progressive signal change in the left sylvian fissure. No enhancement or restricted diffusion to suggest active demyelination."  MRI of the cervical spine 02/24/2004 showed a normal spinal cord and a disc protrusion to the right at C5-C6 causing right C6 nerve root encroachment.  MRI of the thoracic spine 08/16/2005 showed disc protrusion at T9-T10 and milder protrusions elsewhere but no evidence of demyelination  ALLERGIES: Allergies  Allergen Reactions   Methylprednisolone Hives   Solu-Medrol [Methylprednisolone  Sodium Succ] Hives and Itching    Wheezing (was getting with Ocrevus as well)   Cephalexin Hives and Itching   Ocrelizumab Palpitations and Other (See Comments)    (Ocrevus) Wheezing.    HOME MEDICATIONS:  Current Outpatient Medications:    Dextran 70-Hypromellose, PF, (LUBRICANT EYE DROPS, PF,) 0.1-0.3 % SOLN, Place 1-2 drops into both eyes 3 (three) times daily as needed (dry/irritated eyes.)., Disp: , Rfl:    Dimethyl Fumarate 120 MG CPDR, One po bid, Disp: 14 capsule, Rfl: 0   Dimethyl Fumarate 240 MG CPDR, One po bid, Disp: 60 capsule, Rfl: 11   erythromycin ophthalmic ointment, Place 1 inch strip to both eyes every evening., Disp: 3.5 g, Rfl: 11   FLUoxetine (PROZAC) 20 MG capsule, Take 3 capsules (60 mg total) by mouth daily., Disp: 270 capsule, Rfl: 3   levothyroxine (SYNTHROID) 125 MCG tablet, Take 2 tablets (250 mcg total) by mouth daily., Disp: 60 tablet, Rfl: 3   lisdexamfetamine (VYVANSE) 50 MG capsule, Take 1 capsule (50 mg total) by mouth daily., Disp: 30 capsule, Rfl: 0   LORazepam (ATIVAN) 0.5 MG tablet, Take 1 tablet (0.5 mg total) by mouth as needed for anxiety., Disp: 15 tablet, Rfl: 1   naproxen sodium (ALEVE) 220 MG tablet, Take 440 mg by mouth 2 (two) times daily as needed (pain.)., Disp: , Rfl:    ondansetron (ZOFRAN-ODT) 8 MG disintegrating tablet, ondansetron 8 mg disintegrating tablet  PLACE 1 TABLET EVERY 8 HOURS UNDER THE TONGUE AS DIRECTED FOR 10 DAYS., Disp: , Rfl:    pantoprazole (PROTONIX) 40 MG tablet, Take 1 tablet (40 mg total) by mouth daily., Disp: 90 tablet, Rfl: 3   Lifitegrast (XIIDRA) 5 % SOLN, Place 1 drop into both eyes 2 (two) times daily approximately 12 hours apart (Patient not taking: Reported on 03/15/2022), Disp: 60 each, Rfl: 11  PAST MEDICAL HISTORY: Past Medical History:  Diagnosis Date   ADD (attention deficit disorder with hyperactivity)    Anxiety    Arthritis    Cancer ([place])    Skin cancer- basal cell, squamous cell   Cervical  dysplasia    Family history of adverse reaction to anesthesia    mother due to mitral valve regergitation   GERD (gastroesophageal reflux disease)    Headache    migraine   Hepatitis    Pt states Core and surface antibioties for Hepatitis whens he was 18   Hyperthyroidism    Mitral valve prolapse    Multiple sclerosis ([place])    Multiple sclerosis ([place])    Pneumonia    Tendonitis in rigtht wrist     PAST SURGICAL HISTORY: Past Surgical History:  Procedure Laterality Date   ABDOMINAL HYSTERECTOMY  2004   ovaries retained   BACK SURGERY     heart ablation  15 yrs ago for a fib by Dr.Klein, everything ok   KNEE ARTHROSCOPY WITH SUBCHONDROPLASTY Left 11/13/2017   Procedure: LEFT KNEE ARTHROSCOPY WITH SUBCHONDROPLASTY, PARTIAL MEDIAL MENISCECTOMY, SYNOVECTOMY;  Surgeon: Leandrew Koyanagi, MD;  Location: [place];  Service: Orthopedics;  Laterality: Left;   THYROIDECTOMY N/A 04/06/2021   Procedure: TOTAL THYROIDECTOMY;  Surgeon: Armandina Gemma, MD;  Location: [facility];  Service: General;  Laterality: N/A;   TUBAL LIGATION      FAMILY HISTORY: Family History  Problem Relation Age of Onset   Depression Mother    Stroke Mother    Heart disease Mother    Dementia Mother    Hypothyroidism Mother    Stroke Father    Heart disease Father    Parkinson's disease Maternal Grandmother    Parkinson's disease Maternal Grandfather    Heart disease Paternal Grandmother    COPD Paternal Grandfather    Healthy Brother    Cancer Neg Hx    Diabetes Neg Hx    Breast cancer Neg Hx     SOCIAL HISTORY:  Social History   Socioeconomic History   Marital status: Divorced    Spouse name: Not on file   Number of children: Not on file   Years of education: Not on file   Highest education level: Not on file  Occupational History   Not on file  Tobacco Use   Smoking status: Some Days    Packs/day: 0.25    Types: Cigarettes   Smokeless tobacco: Never  Vaping Use   Vaping Use:  Never used  Substance and Sexual Activity   Alcohol use: Yes    Alcohol/week: 0.0 standard drinks of alcohol    Comment: Occasioanl glass of wine    Drug use: No   Sexual activity: Not Currently    Birth control/protection: Surgical  Other Topics Concern   Not on file  Social History Narrative   Not on file   Social Determinants of Health   Financial Resource Strain: Not on file  Food Insecurity: Not on file  Transportation Needs: Not on file  Physical Activity: Not on file  Stress: Not on file  Social Connections: Not on file  Intimate Partner Violence: Not on file     PHYSICAL EXAM   BP (!) 134/92   Pulse 95   Ht 5' 7.5" (1.715 m)   Wt 164 lb (74.4 kg)   BMI 25.31 kg/m    General: The patient is well-developed and well-nourished and in no acute distress   Neurologic Exam  Mental status: The patient is alert and oriented x 3 at the time of the examination. The patient has apparent normal recent and remote memory, with an apparently normal attention span and concentration ability.   Speech is normal.  Cranial nerves: She has exophthalmos.  Extraocular movements show mild dysconjugate gaze associated with diplopia on right gaze, diplopia without obvious dysconjugate gaze to the left.  Color vision was symmetric.  Facial strength is normal.  Trapezius strength is normal.  Hearing is normal.  Motor:  Muscle bulk is normal.  Muscle tone is increased in the legs.  She has reduced strength in the right leg relative to the left  Sensory: Sensory testing is intact to touch and vibration .    Coordination: Cerebellar testing reveals good finger-nose-finger and Heel-to-shin   Gait and station: Station is normal.  She has a slight right foot drop.  Tandem gait is wide.  Romberg is negative.  Reflexes:  Deep tendon reflexes are symmetric and normal bilaterally.     DIAGNOSTIC DATA (LABS, IMAGING, TESTING) - I reviewed patient records, labs, notes, testing and imaging myself  where available.  Lab Results  Component Value Date   WBC 11.4 (H) 08/16/2021   HGB 12.7 08/16/2021   HCT 39.8 08/16/2021   MCV 89 08/16/2021   PLT 362 08/16/2021      Component Value Date/Time   NA 140 04/07/2021 0401   NA 141 12/15/2020 1604   K 3.9 04/07/2021 0401   CL 105 04/07/2021 0401   CO2 28 04/07/2021 0401   GLUCOSE 119 (H) 04/07/2021 0401   BUN 13 04/07/2021 0401   BUN 24 12/15/2020 1604   CREATININE 0.45 04/07/2021 0401   CALCIUM 9.4 04/07/2021 0401   PROT 6.5 12/15/2020 1604   ALBUMIN 4.3 12/15/2020 1604   AST 13 12/15/2020 1604   ALT 18 12/15/2020 1604   ALKPHOS 92 12/15/2020 1604   BILITOT <0.2 12/15/2020 1604   GFRNONAA >60 04/07/2021 0401   GFRAA 136 02/03/2020 0921       ASSESSMENT AND PLAN  MS (multiple sclerosis) (HCC)  Graves disease  Thyroid eye disease  Urinary incontinence, unspecified type  Other fatigue  Urinary frequency   1.   She will change Vumerity to Tecfidera.  Prescription was sent 10..  We will recheck the CBC and CMP.   2.   Continue to f/u with urology for incontinence. 3.   She sees endo and ophtho for her thyroid and eye disease.     4.   She will return to see [place] in 6 months or sooner if any new or worsening neurologic symptoms.   Amare Bail A. Felecia Shelling, MD, PhD, Charlynn Grimes 34/28/7681, 1:57 PM Certified in Neurology, Clinical Neurophysiology, Sleep Medicine, Pain Medicine and Neuroimaging  [facility] [address], [place], [place] [address] [phone]

## 2022-03-16 LAB — CBC WITH DIFFERENTIAL/PLATELET
Basophils Absolute: 0.1 10*3/uL (ref 0.0–0.2)
Basos: 1 %
EOS (ABSOLUTE): 0.3 10*3/uL (ref 0.0–0.4)
Eos: 3 %
Hematocrit: 37.7 % (ref 34.0–46.6)
Hemoglobin: 12.2 g/dL (ref 11.1–15.9)
Immature Grans (Abs): 0 10*3/uL (ref 0.0–0.1)
Immature Granulocytes: 0 %
Lymphocytes Absolute: 4.4 10*3/uL — ABNORMAL HIGH (ref 0.7–3.1)
Lymphs: 45 %
MCH: 29.3 pg (ref 26.6–33.0)
MCHC: 32.4 g/dL (ref 31.5–35.7)
MCV: 90 fL (ref 79–97)
Monocytes Absolute: 0.5 10*3/uL (ref 0.1–0.9)
Monocytes: 5 %
Neutrophils Absolute: 4.6 10*3/uL (ref 1.4–7.0)
Neutrophils: 46 %
Platelets: 242 10*3/uL (ref 150–450)
RBC: 4.17 x10E6/uL (ref 3.77–5.28)
RDW: 13.5 % (ref 11.7–15.4)
WBC: 9.8 10*3/uL (ref 3.4–10.8)

## 2022-03-16 LAB — HEPATIC FUNCTION PANEL
ALT: 11 IU/L (ref 0–32)
AST: 14 IU/L (ref 0–40)
Albumin: 4 g/dL (ref 3.9–4.9)
Alkaline Phosphatase: 91 IU/L (ref 44–121)
Bilirubin Total: 0.3 mg/dL (ref 0.0–1.2)
Bilirubin, Direct: <0.1 mg/dL (ref 0.00–0.40)
Total Protein: 6.5 g/dL (ref 6.0–8.5)

## 2022-03-19 ENCOUNTER — Telehealth: Payer: Self-pay | Admitting: *Deleted

## 2022-03-19 ENCOUNTER — Other Ambulatory Visit (HOSPITAL_COMMUNITY): Payer: Self-pay

## 2022-03-19 NOTE — Telephone Encounter (Signed)
Submitted PA on CMM. Key: [id number]. Received instant approval: "CaseId:[id number];Status:Approved;Review Type:Prior Auth;Coverage Start Date:03/19/2022;Coverage End Date:03/19/2023;"

## 2022-03-20 ENCOUNTER — Telehealth (HOSPITAL_COMMUNITY): Payer: Self-pay | Admitting: *Deleted

## 2022-03-20 ENCOUNTER — Other Ambulatory Visit (HOSPITAL_COMMUNITY): Payer: Self-pay

## 2022-03-20 DIAGNOSIS — F900 Attention-deficit hyperactivity disorder, predominantly inattentive type: Secondary | ICD-10-CM

## 2022-03-20 DIAGNOSIS — F330 Major depressive disorder, recurrent, mild: Secondary | ICD-10-CM

## 2022-03-20 MED ORDER — LISDEXAMFETAMINE DIMESYLATE 50 MG PO CAPS
50.0000 mg | ORAL_CAPSULE | Freq: Every day | ORAL | 0 refills | Status: DC
  Filled 2022-03-20: qty 30, 30d supply, fill #0

## 2022-03-20 NOTE — Telephone Encounter (Signed)
Pt called requesting a refill of the Vyvanse 50 mg qd. Pt was last seen on 12/27/21 and last Rx sent in on 01/30/22. Pt has an upcoming appointment on 03/28/22. Please review.

## 2022-03-20 NOTE — Telephone Encounter (Signed)
Done

## 2022-03-28 ENCOUNTER — Encounter (HOSPITAL_COMMUNITY): Payer: Self-pay | Admitting: Psychiatry

## 2022-03-28 ENCOUNTER — Telehealth (HOSPITAL_BASED_OUTPATIENT_CLINIC_OR_DEPARTMENT_OTHER): Payer: Commercial Managed Care - HMO | Admitting: Psychiatry

## 2022-03-28 ENCOUNTER — Other Ambulatory Visit (HOSPITAL_COMMUNITY): Payer: Self-pay

## 2022-03-28 DIAGNOSIS — F900 Attention-deficit hyperactivity disorder, predominantly inattentive type: Secondary | ICD-10-CM | POA: Diagnosis not present

## 2022-03-28 DIAGNOSIS — F330 Major depressive disorder, recurrent, mild: Secondary | ICD-10-CM

## 2022-03-28 MED ORDER — LISDEXAMFETAMINE DIMESYLATE 50 MG PO CAPS
50.0000 mg | ORAL_CAPSULE | Freq: Every day | ORAL | 0 refills | Status: DC
  Filled 2022-03-28 – 2022-04-20 (×2): qty 30, 30d supply, fill #0

## 2022-03-28 NOTE — Progress Notes (Signed)
Virtual Visit via Telephone Note  I connected with [name] on 03/28/22 at  2:40 PM EST by telephone and verified that I am speaking with the correct person using two identifiers.  Location: Patient: Work Provider: Biomedical scientist   I discussed the limitations, risks, security and privacy concerns of performing an evaluation and management service by telephone and the availability of in person appointments. I also discussed with the patient that there may be a patient responsible charge related to this service. The patient expressed understanding and agreed to proceed.   History of Present Illness: Patient is evaluated by phone session.  She is at work.  She is working at [employer] office part-time.  She reported things are going very well.  Her focus attention is good.  However she is nervous about her insurance which does not cover any thyroid specialist.  So far she is paying out of pocket which is expensive.  She reported Vyvanse helping her multitasking.  She feels good and is able to function.  She is sleeping good.  She denies any panic attack, crying spells or any feeling of hopelessness or worthlessness.  She is very excited as her newborn grandson coming from NICU soon to home after there for at least 3 months.  He was premature born but doing very well.  She is excited about it and like to visit him soon.  Patient told they live in [place].  Patient still struggle sometimes with brain fog but her neurologist recommended to get her thyroid function stabilized first since it could be contributing to brain fog.  Patient like to continue Vyvanse 50 mg which she is getting from [place] but does require 30-day prescription every month.     Past Psychiatric History:  H/O depression and ADD.  Tried Zoloft, Focalin, Ritalin and Strattera.  No history of inpatient or any suicidal attempt.  Psychiatric Specialty Exam: Physical Exam  Review of Systems  Weight 164 lb  (74.4 kg).There is no height or weight on file to calculate BMI.  General Appearance: NA  Eye Contact:  NA  Speech:  Clear and Coherent  Volume:  Normal  Mood:  Anxious  Affect:  NA  Thought Process:  Goal Directed  Orientation:  Full (Time, Place, and Person)  Thought Content:  WDL  Suicidal Thoughts:  No  Homicidal Thoughts:  No  Memory:  Immediate;   Good Recent;   Good Remote;   Good  Judgement:  Intact  Insight:  Present  Psychomotor Activity:  NA  Concentration:  Concentration: Good and Attention Span: Good  Recall:  Good  Fund of Knowledge:  Good  Language:  Good  Akathisia:  No  Handed:  Right  AIMS (if indicated):     Assets:  Communication Skills Desire for Improvement Housing Resilience Social Support  ADL's:  Intact  Cognition:  WNL  Sleep:   ok      Assessment and Plan: Major depressive disorder, recurrent.  Attention deficit disorder, inattentive type.  Patient's anxiety depression and focuses stable.  Continue Vyvanse 50 mg daily.  She is getting Prozac from her neurologist.  Recommend to call us back if she has any question or any concern.  Follow-up in 3 months.  Follow Up Instructions:    I discussed the assessment and treatment plan with the patient. The patient was provided an opportunity to ask questions and all were answered. The patient agreed with the plan and demonstrated an understanding of the instructions.  The patient was advised to call back or seek an in-person evaluation if the symptoms worsen or if the condition fails to improve as anticipated.  Collaboration of Care: Other provider involved in patient's care AEB notes are available in epic to review.  Patient/Guardian was advised Release of Information must be obtained prior to any record release in order to collaborate their care with an outside provider. Patient/Guardian was advised if they have not already done so to contact the registration department to sign all necessary  forms in order for [place] to release information regarding their care.   Consent: Patient/Guardian gives verbal consent for treatment and assignment of benefits for services provided during this visit. Patient/Guardian expressed understanding and agreed to proceed.    I provided 21 minutes of non-face-to-face time during this encounter.   Kathlee Nations, MD

## 2022-04-02 ENCOUNTER — Encounter: Payer: Self-pay | Admitting: Neurology

## 2022-04-02 ENCOUNTER — Other Ambulatory Visit (HOSPITAL_COMMUNITY): Payer: Self-pay

## 2022-04-03 ENCOUNTER — Other Ambulatory Visit: Payer: Self-pay | Admitting: *Deleted

## 2022-04-03 ENCOUNTER — Other Ambulatory Visit (HOSPITAL_COMMUNITY): Payer: Self-pay

## 2022-04-03 DIAGNOSIS — G35 Multiple sclerosis: Secondary | ICD-10-CM

## 2022-04-03 MED ORDER — DIMETHYL FUMARATE 120 MG PO CPDR
120.0000 mg | DELAYED_RELEASE_CAPSULE | Freq: Two times a day (BID) | ORAL | 0 refills | Status: DC
  Filled 2022-04-03 (×2): qty 14, 7d supply, fill #0

## 2022-04-03 MED ORDER — DIMETHYL FUMARATE 240 MG PO CPDR
240.0000 mg | DELAYED_RELEASE_CAPSULE | Freq: Two times a day (BID) | ORAL | 11 refills | Status: DC
  Filled 2022-04-03 (×2): qty 60, 30d supply, fill #0
  Filled 2022-04-27 – 2022-05-02 (×2): qty 60, 30d supply, fill #1

## 2022-04-03 NOTE — Telephone Encounter (Signed)
E-scribed starter dose/maintenance to [employer]. Submitted PA on CMM for starter dose. Key: [id number]. Received instant approval: [id number]. Approved effective 04/03/2022-04/03/2023.

## 2022-04-04 ENCOUNTER — Other Ambulatory Visit (HOSPITAL_COMMUNITY): Payer: Self-pay

## 2022-04-20 ENCOUNTER — Other Ambulatory Visit (HOSPITAL_COMMUNITY): Payer: Self-pay

## 2022-04-23 ENCOUNTER — Other Ambulatory Visit (HOSPITAL_COMMUNITY): Payer: Self-pay

## 2022-04-25 ENCOUNTER — Other Ambulatory Visit (HOSPITAL_COMMUNITY): Payer: Self-pay

## 2022-04-27 ENCOUNTER — Other Ambulatory Visit (HOSPITAL_COMMUNITY): Payer: Self-pay

## 2022-05-02 ENCOUNTER — Other Ambulatory Visit (HOSPITAL_COMMUNITY): Payer: Self-pay

## 2022-05-02 ENCOUNTER — Other Ambulatory Visit: Payer: Self-pay

## 2022-05-17 ENCOUNTER — Other Ambulatory Visit: Payer: Self-pay | Admitting: Endocrinology

## 2022-05-17 DIAGNOSIS — E890 Postprocedural hypothyroidism: Secondary | ICD-10-CM

## 2022-05-18 ENCOUNTER — Other Ambulatory Visit (INDEPENDENT_AMBULATORY_CARE_PROVIDER_SITE_OTHER): Payer: Commercial Managed Care - HMO

## 2022-05-18 DIAGNOSIS — E890 Postprocedural hypothyroidism: Secondary | ICD-10-CM

## 2022-05-18 LAB — T4, FREE: Free T4: 0.86 ng/dL (ref 0.60–1.60)

## 2022-05-18 LAB — TSH: TSH: 9.12 u[IU]/mL — ABNORMAL HIGH (ref 0.35–5.50)

## 2022-05-22 ENCOUNTER — Other Ambulatory Visit (HOSPITAL_COMMUNITY): Payer: Self-pay

## 2022-05-22 ENCOUNTER — Telehealth (INDEPENDENT_AMBULATORY_CARE_PROVIDER_SITE_OTHER): Payer: Commercial Managed Care - HMO | Admitting: Endocrinology

## 2022-05-22 ENCOUNTER — Encounter: Payer: Self-pay | Admitting: Endocrinology

## 2022-05-22 DIAGNOSIS — E890 Postprocedural hypothyroidism: Secondary | ICD-10-CM | POA: Diagnosis not present

## 2022-05-22 NOTE — Progress Notes (Signed)
Patient ID: [name], female   DOB: 10-Feb-1961, 62 y.o.   MRN: [id number]                                                                                                               Reason for Appointment:  Hyperthyroidism, follow-up visit  I connected with the above-named patient by video enabled telemedicine application and verified that I am speaking with the correct person. The patient was explained the limitations of evaluation and management by telemedicine and the availability of in person appointments.  Patient also understood that there may be a patient responsible charge related to this service  Location of the patient: Patient's home  Location of the provider: Physician office Only the patient and myself were participating in the encounter The patient understood the above statements and agreed to proceed.   Chief complaint: Follow-up   History of Present Illness:   Baseline history: 4 weeks prior to her initial visit she had symptoms of palpitations, dizziness, feeling excessively warm/sweaty, shakiness of her legs, and weight loss The patient had last 15 about  lbs since these symptoms started Apparently she had gastroenteritis-like symptoms for about 5 days right after her Covid booster shot on 01/20/2020 also associated with significant malaise and weakness She was also feeling like her heart was racing and her breathing was difficult Despite her normal appetite she has lost weight  Initially for treatment she was started on methimazole 10 mg twice daily With this she had resolution of her symptoms of palpitations, breathing difficulty, feeling warm and shakiness Her methimazole dose subsequently had been quite variable with her thyroid levels fluctuating between hyperthyroidism and hypothyroidism  Recent history:  Because of persistent hyperthyroidism along with Graves' ophthalmic disease as well as higher thyrotropin receptor antibody she was recommended  thyroidectomy She had total thyroidectomy on 04/06/2021  Since surgery her levothyroxine dosage has been fluctuating markedly and needing frequent adjustment  Previous hypothyroid symptoms had been fatigue, hair loss, trouble concentration or unusual cold intolerance, weight gain   On her last visit in 8/23 she was switched to 125 mcg, 2 tablets daily However TSH was still low in 10/23 and since then has been taking 2 pills on 5 days and 1 tablet on the other 2 days of the week This would give her an average dose of 214 mcg  She has been quite regular with her supplement before breakfast as before  Currently she feels fairly good without any unusual fatigue  However TSH is unusually high at 9.1  Ophthalmopathy: She has had blurring of her vision, improved with steroid course and was given Netty Starring again  She is being followed by her ophthalmologist regularly  Wt Readings from Last 3 Encounters:  03/15/22 164 lb (74.4 kg)  01/09/22 162 lb 3.2 oz (73.6 kg)  09/28/21 166 lb 9.6 oz (75.6 kg)     Lab Results  Component Value Date   FREET4 0.86 05/18/2022   FREET4 1.82 (H) 01/05/2022   FREET4 0.45 (L) 09/26/2021  T3FREE 2.9 05/16/2021   T3FREE 3.5 03/21/2021   T3FREE 3.0 01/31/2021   TSH 9.12 (H) 05/18/2022   TSH 0.19 (L) 02/23/2022   TSH 0.26 (L) 01/05/2022    Lab Results  Component Value Date   THYROTRECAB 9.16 (H) 03/21/2021   THYROTRECAB 10.40 (H) 12/21/2020   THYROTRECAB 17.80 (H) 09/23/2020     Allergies as of 05/22/2022       Reactions   Methylprednisolone Hives   Solu-medrol [methylprednisolone Sodium Succ] Hives, Itching   Wheezing (was getting with Ocrevus as well)   Cephalexin Hives, Itching   Ocrelizumab Palpitations, Other (See Comments)   (Ocrevus) Wheezing.        Medication List        Accurate as of May 22, 2022 10:56 AM. If you have any questions, ask your nurse or doctor.          Dimethyl Fumarate 120 MG Cpdr Take 1 capsule (120  mg) by mouth 2 (two) times daily.   Dimethyl Fumarate 240 MG Cpdr Take 1 capsule (240 mg total) by mouth 2 (two) times daily. Start after '120mg'$  capsules   erythromycin ophthalmic ointment Place 1 inch strip to both eyes every evening.   FLUoxetine 20 MG capsule Commonly known as: PROZAC Take 3 capsules (60 mg total) by mouth daily.   levothyroxine 125 MCG tablet Commonly known as: SYNTHROID Take 2 tablets (250 mcg total) by mouth daily.   LORazepam 0.5 MG tablet Commonly known as: Ativan Take 1 tablet (0.5 mg total) by mouth as needed for anxiety.   Lubricant Eye Drops (PF) 0.1-0.3 % Soln Generic drug: Dextran 70-Hypromellose (PF) Place 1-2 drops into both eyes 3 (three) times daily as needed (dry/irritated eyes.).   naproxen sodium 220 MG tablet Commonly known as: ALEVE Take 440 mg by mouth 2 (two) times daily as needed (pain.).   ondansetron 8 MG disintegrating tablet Commonly known as: ZOFRAN-ODT ondansetron 8 mg disintegrating tablet  PLACE 1 TABLET EVERY 8 HOURS UNDER THE TONGUE AS DIRECTED FOR 10 DAYS.   pantoprazole 40 MG tablet Commonly known as: PROTONIX Take 1 tablet (40 mg total) by mouth daily.   Vyvanse 50 MG capsule Generic drug: lisdexamfetamine Take 1 capsule (50 mg total) by mouth daily.   Xiidra 5 % Soln Generic drug: Lifitegrast Place 1 drop into both eyes 2 (two) times daily approximately 12 hours apart            Past Medical History:  Diagnosis Date   ADD (attention deficit disorder with hyperactivity)    Anxiety    Arthritis    Cancer ([place])    Skin cancer- basal cell, squamous cell   Cervical dysplasia    Family history of adverse reaction to anesthesia    mother due to mitral valve regergitation   GERD (gastroesophageal reflux disease)    Headache    migraine   Hepatitis    Pt states [facility] and [facility] antibioties for Hepatitis whens he was 18   Hyperthyroidism    Mitral valve prolapse    Multiple sclerosis ([place])    Multiple  sclerosis ([place])    Pneumonia    Tendonitis in rigtht wrist     Past Surgical History:  Procedure Laterality Date   ABDOMINAL HYSTERECTOMY  2004   ovaries retained   BACK SURGERY     heart ablation     15 yrs ago for a fib by Dr.Klein, everything ok   KNEE ARTHROSCOPY WITH SUBCHONDROPLASTY Left 11/13/2017   Procedure:  LEFT KNEE ARTHROSCOPY WITH SUBCHONDROPLASTY, PARTIAL MEDIAL MENISCECTOMY, SYNOVECTOMY;  Surgeon: Leandrew Koyanagi, MD;  Location: [facility];  Service: Orthopedics;  Laterality: Left;   THYROIDECTOMY N/A 04/06/2021   Procedure: TOTAL THYROIDECTOMY;  Surgeon: Armandina Gemma, MD;  Location: [facility];  Service: General;  Laterality: N/A;   TUBAL LIGATION      Family History  Problem Relation Age of Onset   Depression Mother    Stroke Mother    Heart disease Mother    Dementia Mother    Hypothyroidism Mother    Stroke Father    Heart disease Father    Parkinson's disease Maternal Grandmother    Parkinson's disease Maternal Grandfather    Heart disease Paternal Grandmother    COPD Paternal Grandfather    Healthy Brother    Cancer Neg Hx    Diabetes Neg Hx    Breast cancer Neg Hx     Social History:  reports that she has been smoking cigarettes. She has been smoking an average of .25 packs per day. She has never used smokeless tobacco. She reports current alcohol use. She reports that she does not use drugs.  Allergies:  Allergies  Allergen Reactions   Methylprednisolone Hives   Solu-Medrol [Methylprednisolone Sodium Succ] Hives and Itching    Wheezing (was getting with Ocrevus as well)   Cephalexin Hives and Itching   Ocrelizumab Palpitations and Other (See Comments)    (Ocrevus) Wheezing.     Review of Systems     Examination:   There were no vitals taken for this visit.    Assessment/Plan:  Hypothyroidism postsurgical with history of Graves' disease  She is now on levothyroxine equivalent of 214 mcg daily As before she appears to be  requiring relatively high doses of supplement Even without any change in her routine or interacting supplements her TSH is high at 9 Still requiring relatively lower doses than earlier   Since her TSH is significantly higher than before she will go back to taking 2 tablets of 125 mcg daily  Follow-up TSH in 6 weeks  Office visit in 4 months  Daekwon Beswick 05/22/2022, 10:56 AM    Note: This office note was prepared with Dragon voice recognition system technology. Any transcriptional errors that result from this process are unintentional.

## 2022-05-24 ENCOUNTER — Other Ambulatory Visit (HOSPITAL_COMMUNITY): Payer: Self-pay

## 2022-05-25 ENCOUNTER — Other Ambulatory Visit (HOSPITAL_COMMUNITY): Payer: Self-pay

## 2022-05-28 ENCOUNTER — Telehealth: Payer: Self-pay | Admitting: Pharmacy Technician

## 2022-05-28 ENCOUNTER — Other Ambulatory Visit (HOSPITAL_COMMUNITY): Payer: Self-pay

## 2022-05-28 NOTE — Telephone Encounter (Signed)
Patient Advocate Encounter   Received notification that prior authorization for Dimethyl Fumarate '240MG'$  dr capsules is required.   PA submitted on 05/28/2022 Key [id number] Status is pending       Lyndel Safe, [facility] Patient Advocate Specialist [facility] Patient Advocate Team Direct Number: [phone]  Fax: [phone]

## 2022-05-31 ENCOUNTER — Other Ambulatory Visit (HOSPITAL_COMMUNITY): Payer: Self-pay

## 2022-05-31 NOTE — Telephone Encounter (Signed)
Pharmacy Patient Advocate Encounter  Prior Authorization for Dimethyl Fumarate '240MG'$  dr capsules has been approved.    PA# PA Case ID: [id number] Effective dates: 05/31/2022 through 1/11/

## 2022-06-01 ENCOUNTER — Other Ambulatory Visit (HOSPITAL_COMMUNITY): Payer: Self-pay

## 2022-06-04 ENCOUNTER — Encounter: Payer: Self-pay | Admitting: Neurology

## 2022-06-05 ENCOUNTER — Other Ambulatory Visit: Payer: Self-pay | Admitting: *Deleted

## 2022-06-05 DIAGNOSIS — G35 Multiple sclerosis: Secondary | ICD-10-CM

## 2022-06-05 MED ORDER — DIMETHYL FUMARATE 240 MG PO CPDR: 240.0000 mg | DELAYED_RELEASE_CAPSULE | Freq: Two times a day (BID) | ORAL | 3 refills | Status: DC

## 2022-06-13 ENCOUNTER — Other Ambulatory Visit: Payer: Self-pay | Admitting: *Deleted

## 2022-06-13 ENCOUNTER — Encounter: Payer: Self-pay | Admitting: Neurology

## 2022-06-13 DIAGNOSIS — G35D Multiple sclerosis, unspecified: Secondary | ICD-10-CM

## 2022-06-13 DIAGNOSIS — G35 Multiple sclerosis: Secondary | ICD-10-CM

## 2022-06-13 MED ORDER — DIMETHYL FUMARATE 240 MG PO CPDR: 240.0000 mg | DELAYED_RELEASE_CAPSULE | Freq: Two times a day (BID) | ORAL | 3 refills | Status: DC

## 2022-06-14 ENCOUNTER — Telehealth (HOSPITAL_COMMUNITY): Payer: Self-pay | Admitting: *Deleted

## 2022-06-14 ENCOUNTER — Other Ambulatory Visit (HOSPITAL_COMMUNITY): Payer: Self-pay

## 2022-06-14 DIAGNOSIS — F900 Attention-deficit hyperactivity disorder, predominantly inattentive type: Secondary | ICD-10-CM

## 2022-06-14 DIAGNOSIS — F330 Major depressive disorder, recurrent, mild: Secondary | ICD-10-CM

## 2022-06-14 MED ORDER — LISDEXAMFETAMINE DIMESYLATE 50 MG PO CAPS: 50.0000 mg | ORAL_CAPSULE | Freq: Every day | ORAL | 0 refills | Status: DC

## 2022-06-14 MED ORDER — LISDEXAMFETAMINE DIMESYLATE 50 MG PO CAPS
50.0000 mg | ORAL_CAPSULE | Freq: Every day | ORAL | 0 refills | Status: DC
  Filled 2022-06-14: qty 30, 30d supply, fill #0

## 2022-06-14 NOTE — Telephone Encounter (Signed)
Pt called back to request Vyvanse 50 mg caps be sent to [facility] on [address] as [facility] is too expensive since medication went generic. Pt says she can use [facility] card at [facility] and will pay $100 per month. Pharmacy added. Please review.

## 2022-06-14 NOTE — Telephone Encounter (Signed)
Done

## 2022-06-14 NOTE — Telephone Encounter (Signed)
Pt called requesting refill of the Vyvanse 50 mg #30. Last script sent on 04/19/22. Pt has an upcoming appointment on 06/27/22. Please review.

## 2022-06-27 ENCOUNTER — Encounter (HOSPITAL_COMMUNITY): Payer: Self-pay | Admitting: Psychiatry

## 2022-06-27 ENCOUNTER — Telehealth (HOSPITAL_BASED_OUTPATIENT_CLINIC_OR_DEPARTMENT_OTHER): Payer: 59 | Admitting: Psychiatry

## 2022-06-27 VITALS — Wt 170.0 lb

## 2022-06-27 DIAGNOSIS — F419 Anxiety disorder, unspecified: Secondary | ICD-10-CM | POA: Diagnosis not present

## 2022-06-27 DIAGNOSIS — F900 Attention-deficit hyperactivity disorder, predominantly inattentive type: Secondary | ICD-10-CM | POA: Diagnosis not present

## 2022-06-27 DIAGNOSIS — F330 Major depressive disorder, recurrent, mild: Secondary | ICD-10-CM

## 2022-06-27 NOTE — Progress Notes (Signed)
Virtual Visit via Telephone Note  I connected with [name] on 06/27/22 at  2:40 PM EST by telephone and verified that I am speaking with the correct person using two identifiers.  Location: Patient: Home Provider: Home Office   I discussed the limitations, risks, security and privacy concerns of performing an evaluation and management service by telephone and the availability of in person appointments. I also discussed with the patient that there may be a patient responsible charge related to this service. The patient expressed understanding and agreed to proceed.   History of Present Illness: Patient is evaluated by phone session.  She is working part-time at [employer]. She likes her job because it is less stressful.  Patient told she has a hard time getting the Vyvanse.  She is without the medicine for 2 weeks and despite trying multiple pharmacy she has not able to find the prescription filled.  She does not want to change the medication because Vyvanse helps her attention, focus and multitasking.  She is taking Prozac that is helping her depression and anxiety.  She reported her appetite is okay but she has gained a few pounds since not taking the Vyvanse.  She continues to pay out of pocket for her thyroid specialist as her current insurance does not cover her endocrinologist.  Patient otherwise doing well.  She sleeps good.  She denies any hallucination.  Her grand baby is doing well who is now 62 months old.  Patient sometimes has brain fog which could be due to her underlying medical condition but so far she reported symptoms are manageable.   Past Psychiatric History:  H/O depression and ADD.  Tried Zoloft, Focalin, Ritalin and Strattera.  No history of inpatient or any suicidal attempt.  Psychiatric Specialty Exam: Physical Exam  Review of Systems  Weight 170 lb (77.1 kg).There is no height or weight on file to calculate BMI.  General Appearance: NA  Eye Contact:   NA  Speech:  Clear and Coherent  Volume:  Normal  Mood:  Anxious  Affect:  NA  Thought Process:  Goal Directed  Orientation:  Full (Time, Place, and Person)  Thought Content:  Logical  Suicidal Thoughts:  No  Homicidal Thoughts:  No  Memory:  Immediate;   Good Recent;   Good Remote;   Good  Judgement:  Intact  Insight:  Present  Psychomotor Activity:  NA  Concentration:  Concentration: Fair and Attention Span: Fair  Recall:  Good  Fund of Knowledge:  Good  Language:  Good  Akathisia:  No  Handed:  Right  AIMS (if indicated):     Assets:  Communication Skills Desire for Improvement Housing Resilience  ADL's:  Intact  Cognition:  WNL  Sleep:         Assessment and Plan: Major depressive disorder, recurrent.  Attention deficit disorder, inattentive type.  Anxiety.  Patient has a hard time getting her Vyvanse filled due to backorder.  She does not want to change the medication because it does help very well.  We discussed about switching to nonstimulant medication because most of the stimulant is having issues getting on time due to backorder.  Patient like to wait and she will call us if she find the pharmacy where she can get the Vyvanse.  No new prescription given on this follow-up.  Patient will call us if she has any question or any concern.  Follow-up in 3 months.  Follow Up Instructions:    I  discussed the assessment and treatment plan with the patient. The patient was provided an opportunity to ask questions and all were answered. The patient agreed with the plan and demonstrated an understanding of the instructions.   The patient was advised to call back or seek an in-person evaluation if the symptoms worsen or if the condition fails to improve as anticipated.  Collaboration of Care: Other provider involved in patient's care AEB notes are available in epic to review.  Patient/Guardian was advised Release of Information must be obtained prior to any record release  in order to collaborate their care with an outside provider. Patient/Guardian was advised if they have not already done so to contact the registration department to sign all necessary forms in order for [place] to release information regarding their care.   Consent: Patient/Guardian gives verbal consent for treatment and assignment of benefits for services provided during this visit. Patient/Guardian expressed understanding and agreed to proceed.    I provided 15 minutes of non-face-to-face time during this encounter.   Kathlee Nations, MD

## 2022-07-10 ENCOUNTER — Other Ambulatory Visit (INDEPENDENT_AMBULATORY_CARE_PROVIDER_SITE_OTHER): Payer: 59

## 2022-07-10 DIAGNOSIS — E890 Postprocedural hypothyroidism: Secondary | ICD-10-CM

## 2022-07-10 LAB — TSH: TSH: 32.28 u[IU]/mL — ABNORMAL HIGH (ref 0.35–5.50)

## 2022-07-10 NOTE — Progress Notes (Signed)
Thyroid level has become lower than before, confirm that she is taking 2 tablets of the 125 mcg levothyroxine.  Will need to increase the dose to 2-1/2 tablets daily, new prescription to be sent but will need follow-up scheduled in 6 weeks with labs

## 2022-07-11 ENCOUNTER — Encounter: Payer: Self-pay | Admitting: Endocrinology

## 2022-07-12 ENCOUNTER — Other Ambulatory Visit: Payer: Self-pay

## 2022-07-12 DIAGNOSIS — E890 Postprocedural hypothyroidism: Secondary | ICD-10-CM

## 2022-07-12 MED ORDER — LEVOTHYROXINE SODIUM 125 MCG PO TABS
312.5000 ug | ORAL_TABLET | Freq: Every day | ORAL | 3 refills | Status: DC
  Filled 2022-07-12: qty 60, 24d supply, fill #0
  Filled 2022-08-26: qty 60, 24d supply, fill #1

## 2022-07-12 NOTE — Telephone Encounter (Signed)
Spoke with patient in previous telephone encounter

## 2022-07-15 ENCOUNTER — Other Ambulatory Visit (HOSPITAL_COMMUNITY): Payer: Self-pay | Admitting: Psychiatry

## 2022-07-15 DIAGNOSIS — F330 Major depressive disorder, recurrent, mild: Secondary | ICD-10-CM

## 2022-07-15 DIAGNOSIS — F419 Anxiety disorder, unspecified: Secondary | ICD-10-CM

## 2022-07-17 ENCOUNTER — Other Ambulatory Visit (HOSPITAL_COMMUNITY): Payer: Self-pay

## 2022-07-17 ENCOUNTER — Telehealth: Payer: Self-pay | Admitting: Neurology

## 2022-07-17 ENCOUNTER — Other Ambulatory Visit (HOSPITAL_COMMUNITY): Payer: Self-pay | Admitting: *Deleted

## 2022-07-17 ENCOUNTER — Other Ambulatory Visit: Payer: Self-pay

## 2022-07-17 DIAGNOSIS — F330 Major depressive disorder, recurrent, mild: Secondary | ICD-10-CM

## 2022-07-17 DIAGNOSIS — F419 Anxiety disorder, unspecified: Secondary | ICD-10-CM

## 2022-07-17 MED ORDER — LORAZEPAM 0.5 MG PO TABS
0.5000 mg | ORAL_TABLET | Freq: Every day | ORAL | 1 refills | Status: DC
  Filled 2022-07-17: qty 15, 15d supply, fill #0

## 2022-07-17 MED ORDER — LORAZEPAM 0.5 MG PO TABS: 0.5000 mg | ORAL_TABLET | ORAL | 1 refills | Status: DC | PRN

## 2022-07-17 NOTE — Telephone Encounter (Signed)
LVM and sent mychart msg informing pt of need to reschedule 09/13/22 appointment - MD out

## 2022-07-18 ENCOUNTER — Other Ambulatory Visit: Payer: Self-pay

## 2022-07-20 ENCOUNTER — Other Ambulatory Visit (HOSPITAL_COMMUNITY): Payer: Self-pay

## 2022-07-25 ENCOUNTER — Telehealth (HOSPITAL_COMMUNITY): Payer: Self-pay | Admitting: *Deleted

## 2022-07-25 NOTE — Telephone Encounter (Signed)
Not sure what other stimulants are not backorder.  She need to call the pharmacy if they have Ritalin before I send the prescription.

## 2022-07-25 NOTE — Telephone Encounter (Signed)
Pt called requesting alternative medication to the Vyvanse 50 mg as she cannot afford it. Pt next scheduled appointment scheduled for 09/25/22. Please review and advise.

## 2022-07-25 NOTE — Telephone Encounter (Signed)
How many mg's? I'll call.

## 2022-07-26 ENCOUNTER — Other Ambulatory Visit (HOSPITAL_COMMUNITY): Payer: Self-pay

## 2022-07-26 ENCOUNTER — Other Ambulatory Visit: Payer: Self-pay

## 2022-07-26 MED ORDER — METHYLPHENIDATE HCL ER (LA) 10 MG PO CP24
10.0000 mg | ORAL_CAPSULE | Freq: Every day | ORAL | 0 refills | Status: DC
  Filled 2022-07-26 – 2022-08-23 (×2): qty 30, 30d supply, fill #0

## 2022-07-26 NOTE — Telephone Encounter (Signed)
I called Ritalin LA 10 mg daily to [facility]. Please inform patient.

## 2022-07-26 NOTE — Telephone Encounter (Signed)
Ok. Thank you.

## 2022-07-27 ENCOUNTER — Telehealth (HOSPITAL_COMMUNITY): Payer: Self-pay | Admitting: *Deleted

## 2022-07-27 NOTE — Telephone Encounter (Signed)
Pt called stating the Ritalin LA 10 mg QD is same price as the Vyvanse and this means unaffordable. Pt said she called her insurance company and they recommend generic Adderall which would be $50 co-pay.  However we would have to do PA, and get med approved, or we can do a medication exception she says. Pt next scheduled appointment is on 09/25/22. Please review and advise.

## 2022-07-27 NOTE — Telephone Encounter (Signed)
She will need extended release.  Can try Adderall XR 15 mg and she can check with the insurance if they approve.  I hope we do not have to go through prior authorization process.

## 2022-07-30 ENCOUNTER — Other Ambulatory Visit: Payer: Self-pay | Admitting: *Deleted

## 2022-07-30 ENCOUNTER — Other Ambulatory Visit (HOSPITAL_COMMUNITY): Payer: Self-pay

## 2022-07-30 ENCOUNTER — Encounter: Payer: Self-pay | Admitting: Neurology

## 2022-07-30 DIAGNOSIS — G35 Multiple sclerosis: Secondary | ICD-10-CM

## 2022-07-30 MED ORDER — DIMETHYL FUMARATE 240 MG PO CPDR
240.0000 mg | DELAYED_RELEASE_CAPSULE | Freq: Two times a day (BID) | ORAL | 3 refills | Status: DC
  Filled 2022-07-30 (×2): qty 180, 90d supply, fill #0

## 2022-07-31 ENCOUNTER — Other Ambulatory Visit (HOSPITAL_COMMUNITY): Payer: Self-pay

## 2022-08-07 ENCOUNTER — Telehealth (HOSPITAL_COMMUNITY): Payer: Self-pay | Admitting: Psychiatry

## 2022-08-07 NOTE — Telephone Encounter (Signed)
Patient is wanting to know if you had heard back regarding the prior auth to [facility] for the vyvanse. Please advise.

## 2022-08-07 NOTE — Telephone Encounter (Signed)
Have not. Will f/u and call pt. Thank you!

## 2022-08-08 IMAGING — CR DG CHEST 2V
2 series · 2 of 2 positions shown · non-contrast
Comparison: Chest x-ray 03/13/2016.

CLINICAL DATA: 60-year-old female under preoperative evaluation
prior to total thyroidectomy.

EXAM:
CHEST - 2 VIEW

[w chest pa]
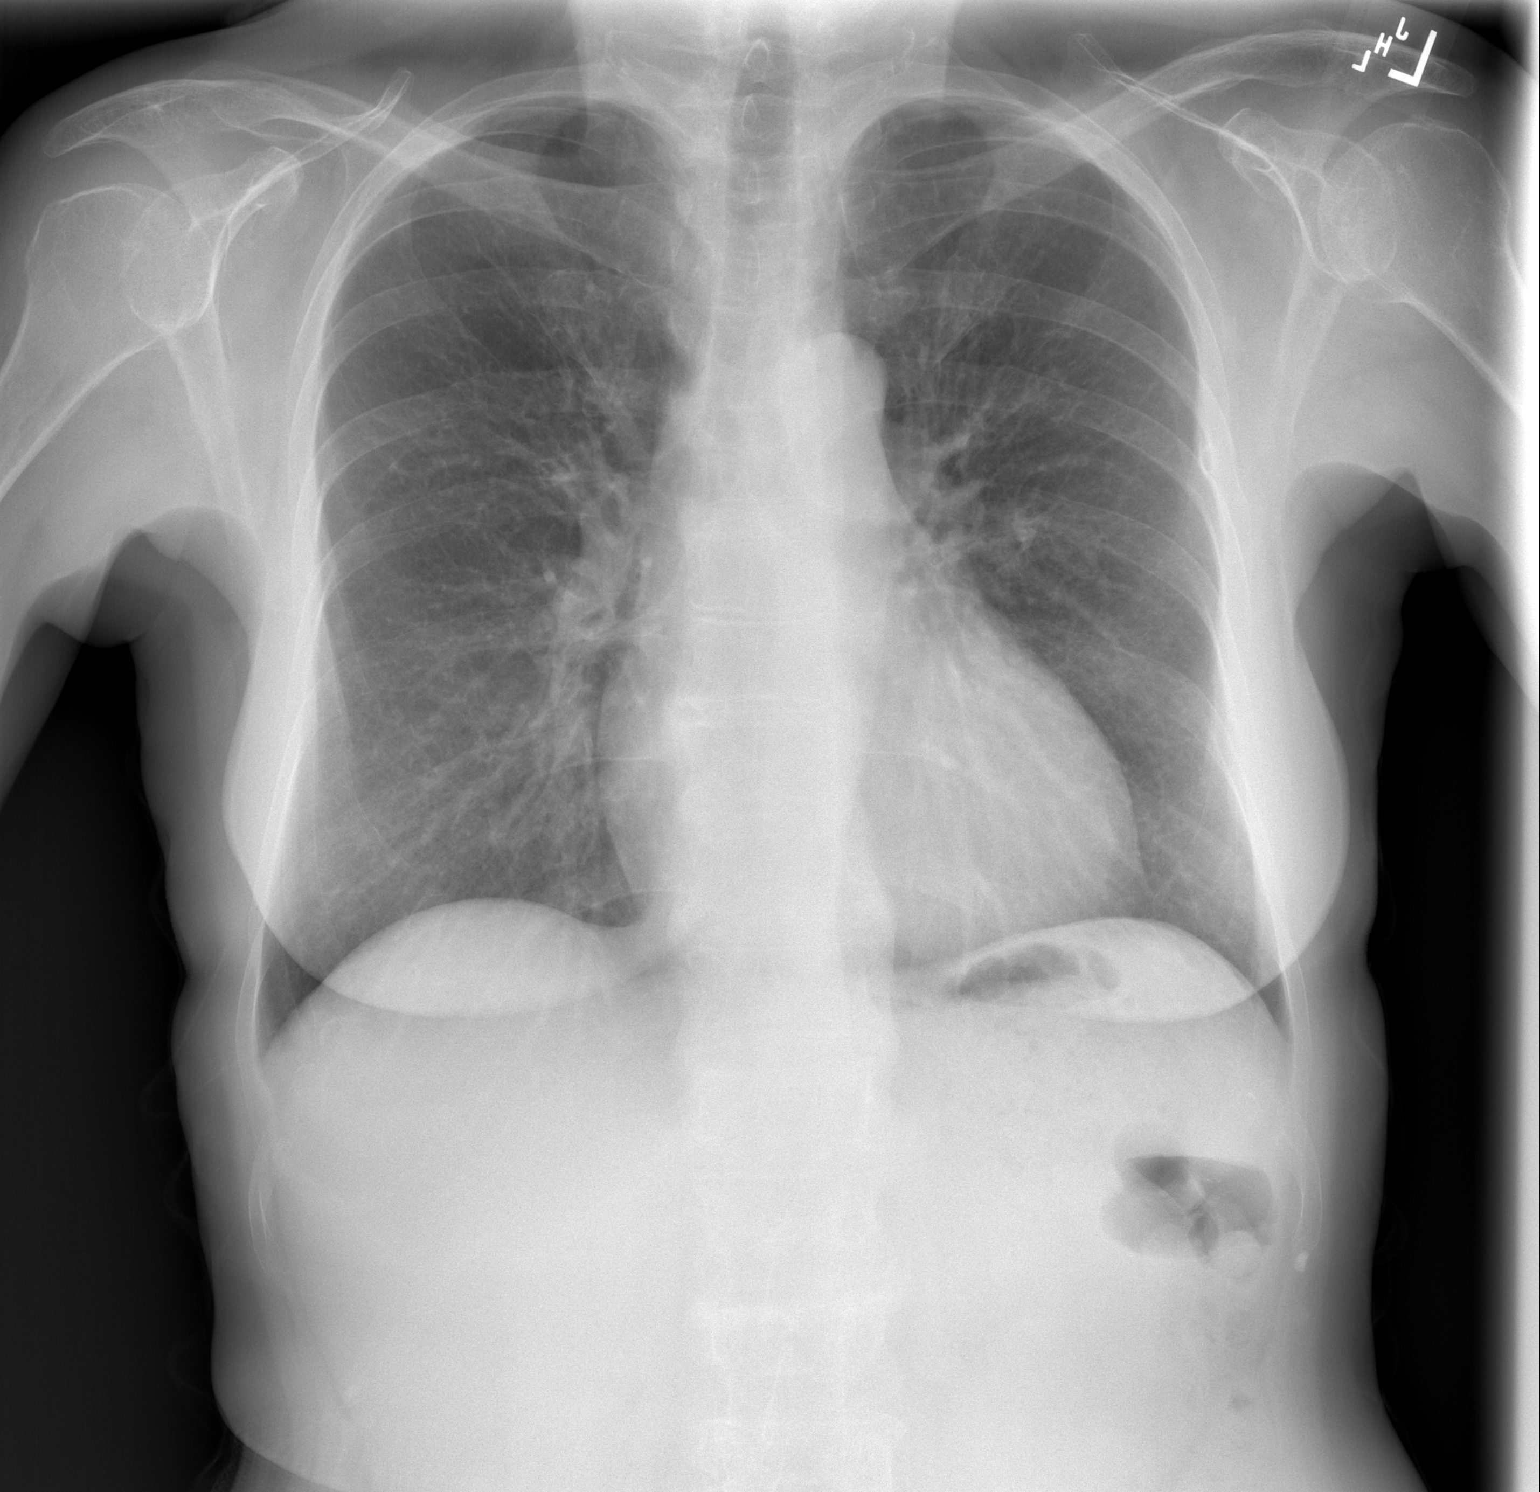

[w chest lat]
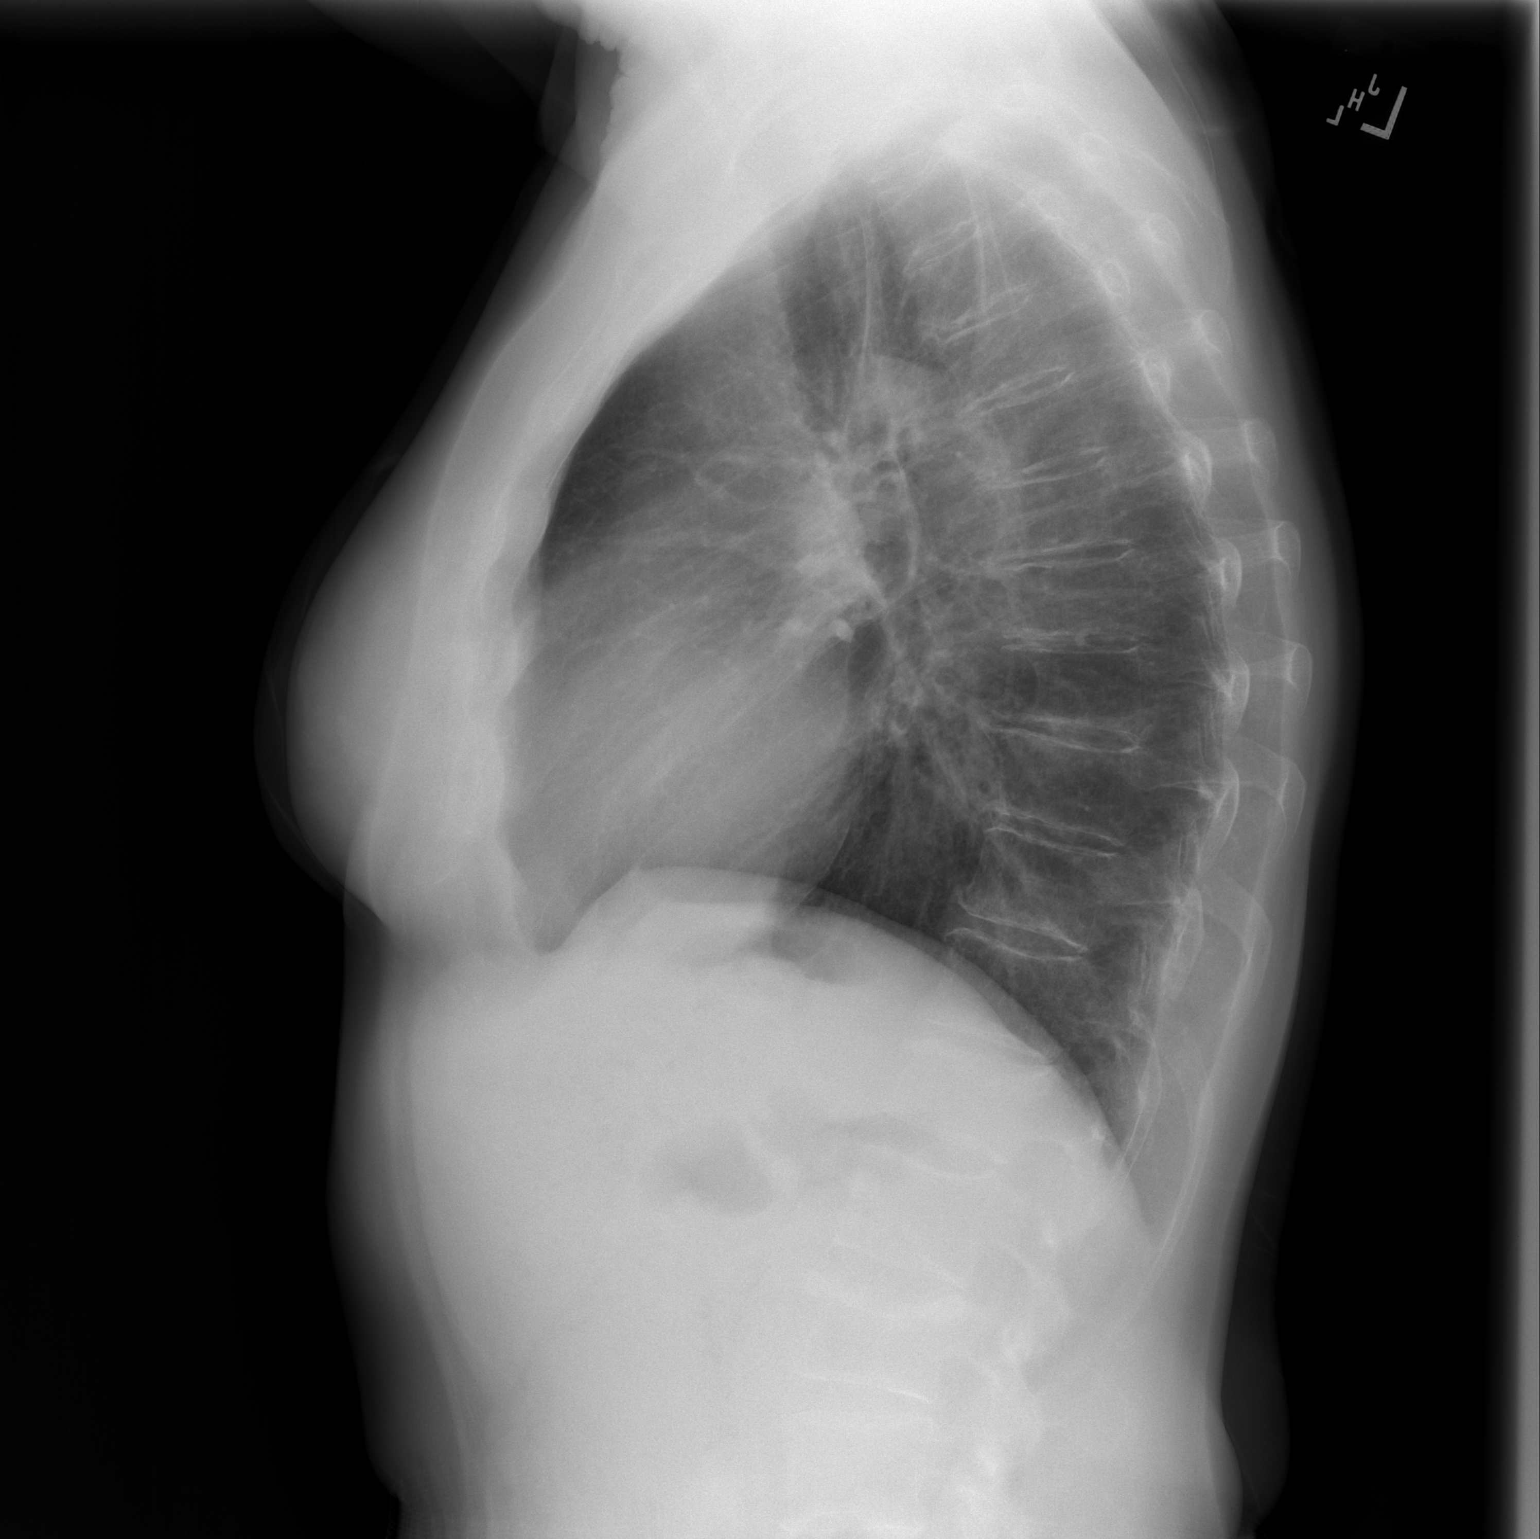

[2 of 2 positions shown; findings below may reference images not displayed]

FINDINGS: Lung volumes are normal. No consolidative airspace disease. No
pleural effusions. No pneumothorax. No pulmonary nodule or mass
noted. Pulmonary vasculature and the cardiomediastinal silhouette
are within normal limits.
IMPRESSION: No radiographic evidence of acute cardiopulmonary disease.

## 2022-08-22 ENCOUNTER — Ambulatory Visit: Payer: 59 | Admitting: Neurology

## 2022-08-22 ENCOUNTER — Encounter: Payer: Self-pay | Admitting: Neurology

## 2022-08-22 ENCOUNTER — Other Ambulatory Visit (HOSPITAL_COMMUNITY): Payer: Self-pay

## 2022-08-22 VITALS — BP 122/70 | HR 76 | Ht 67.0 in | Wt 181.0 lb

## 2022-08-22 DIAGNOSIS — G35 Multiple sclerosis: Secondary | ICD-10-CM

## 2022-08-22 DIAGNOSIS — E0500 Thyrotoxicosis with diffuse goiter without thyrotoxic crisis or storm: Secondary | ICD-10-CM

## 2022-08-22 DIAGNOSIS — R5383 Other fatigue: Secondary | ICD-10-CM

## 2022-08-22 DIAGNOSIS — R32 Unspecified urinary incontinence: Secondary | ICD-10-CM | POA: Diagnosis not present

## 2022-08-22 DIAGNOSIS — Z79899 Other long term (current) drug therapy: Secondary | ICD-10-CM

## 2022-08-22 MED ORDER — DESMOPRESSIN ACETATE 0.1 MG PO TABS
0.1000 mg | ORAL_TABLET | Freq: Every day | ORAL | 3 refills | Status: DC
  Filled 2022-08-22 (×2): qty 90, 90d supply, fill #0

## 2022-08-22 NOTE — Progress Notes (Signed)
[facility]  PATIENT: [name] DOB: February 28, 1961     HISTORICAL  CHIEF COMPLAINT:  Chief Complaint  Patient presents with   Room 10    Pt is here Alone. Pt states that things have been about the same since last appointment. Pt states that she doesn't have any muscle weakness in her legs. Pt states that she has tingling in her hands and the bottom of her feet.     HISTORY OF PRESENT ILLNESS:  [name] is a 62 y.o.woman with relapsing remitting multiple sclerosis.   Update 08/22/2022 She has Grave's disease (has Thyrotropin receptor Abs).  Her right eye vision is progressively worsenig.   She has diploia.  She is on methimazole and more recently was on Tepeza (had 8 infusion ) with benefit - less diplopia   There is not a lot of data about additional courses if needed so not sure if she will do again.   She has needed eyelid surgery.   Recent TSH was 6.88 (06/26/2021)and normal T4-free.    She had her thyroid removed and is on synthroid (now on 175 mcg 6 days a week and 12 that dose 1 day a week).  Vision is still impaired.     She sees ophthalmology (Dr. Sabino Dick)  and endocrinology regularly (Dr. Dwyane Dee)  She stopped Vumerity and switched to DMF due to insurance issues.   No exacerbations > 10 year.  However, MRI 03/02/2019 showed one new lesion.  MRI 2021 showed no new lesion  She was allergic to Ocrevus and had multiple skin cancers (basal and squamous) while on Gilenya (still has had some on other medications).   She has no recent skin lesions.     She was never on [place].   Symptoms started after he Covid booster.  Gait is mildly of balanced she feels due to reduced depth perception more than MS.  No falls.  No weakness.  She has s sensation like socks rolled up under her toes/distal foot.  She has urinary incontinence..   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     Not on an medication for bladder or bowel now.   She has extreme urgency with both and can't always get to a facility in time.   She has nocturia 3-4 times most nights.  She wears pads.      She ihas some mental fog.   Vyvanse helps ADD.   Due to naitonal shortage she has not been on any.   She is working part-time 16-20 hours only.  She has some fatigue.  She is sleeping better but has nocturia 2-3 rimes nightly..  Mood is doing ok on current fluoxetine   MS history: She was diagnosed with relapsing remitting MS around 2000 after presenting with numbness in the hands and feet.  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 She was admitted to the hospital. She then was diagnosed with MS and is to see Dr. Jacqulynn Cadet at [facility]. He started her on Betaseron.   She did fairly well on Betaseron. She did have several small exacerbations and received some steroids a couple times. Often the exacerbations would be associated with headache as well. About 4 years ago, she opted to switch to Gilenya to have oral therapy for the MS. She has done very well on that medication without any exacerbations.  Her June 2015 MRI did show one focus that was not present on her MRI from March 2010.  Due to several skin cancers, she switched to Copaxone in 2018 but had breakthrough activity.  She switched to [place] in 2019 but had difficulty tolerating the medication.  She switched to Vumerity in early 2020.  Recent imaging: MRI brain 03/02/2019 showed a "new lesion involving the right superior temporal gyrus consistent with progression of disease.    Increased prominence of lesions in the subcortical white matter of the cingulate gyrus bilaterally, right greater than left. This could impact symptoms in the feet or lower extremities and is also consistent with progression of disease.  Progressive signal change in the left sylvian fissure. No enhancement or restricted diffusion to suggest  active demyelination."  MRI of the cervical spine 02/24/2004 showed a normal spinal cord and a disc protrusion to the right at C5-C6 causing right C6 nerve root encroachment.  MRI of the thoracic spine 08/16/2005 showed disc protrusion at T9-T10 and milder protrusions elsewhere but no evidence of demyelination  ALLERGIES: Allergies  Allergen Reactions   Methylprednisolone Hives   Solu-Medrol [Methylprednisolone Sodium Succ] Hives and Itching    Wheezing (was getting with Ocrevus as well)   Cephalexin Hives and Itching   Ocrelizumab Palpitations and Other (See Comments)    (Ocrevus) Wheezing.    HOME MEDICATIONS:  Current Outpatient Medications:    Dextran 70-Hypromellose, PF, (LUBRICANT EYE DROPS, PF,) 0.1-0.3 % SOLN, Place 1-2 drops into both eyes 3 (three) times daily as needed (dry/irritated eyes.)., Disp: , Rfl:    Dimethyl Fumarate 240 MG CPDR, Take 1 capsule (240 mg total) by mouth 2 (two) times daily., Disp: 180 capsule, Rfl: 3   erythromycin ophthalmic ointment, Place 1 inch strip to both eyes every evening., Disp: 3.5 g, Rfl: 11   FLUoxetine (PROZAC) 20 MG capsule, Take 3 capsules (60 mg total) by mouth daily., Disp: 270 capsule, Rfl: 3   levothyroxine (SYNTHROID) 125 MCG tablet, Take 2.5 tablets (312.5 mcg total) by mouth daily., Disp: 60 tablet, Rfl: 3   LORazepam (ATIVAN) 0.5 MG tablet, Take 1 tablet (0.5 mg total) by mouth as needed for anxiety., Disp: 15 tablet, Rfl: 1   pantoprazole (PROTONIX) 40 MG tablet, Take 1 tablet (40 mg total) by mouth daily., Disp: 90 tablet, Rfl: 3   Lifitegrast (XIIDRA) 5 % SOLN, Place 1 drop into both eyes 2 (two) times daily approximately 12 hours apart (Patient not taking: Reported on 08/22/2022), Disp: 60 each, Rfl: 11   lisdexamfetamine (VYVANSE) 50 MG capsule, Take 1 capsule (50 mg total) by mouth daily. (Patient not taking: Reported on 08/22/2022), Disp: 30 capsule, Rfl: 0   methylphenidate (RITALIN LA) 10 MG 24 hr capsule, Take 1 capsule (10  mg total) by mouth daily. (Patient not taking: Reported on 08/22/2022), Disp: 30 capsule, Rfl: 0  PAST MEDICAL HISTORY: Past Medical History:  Diagnosis Date   ADD (attention deficit disorder with hyperactivity)    Anxiety    Arthritis    Cancer    Skin cancer- basal cell, squamous cell   Cervical dysplasia    Family history of adverse reaction to anesthesia    mother due to mitral valve regergitation   GERD (gastroesophageal reflux disease)    Headache    migraine   Hepatitis    Pt states Core and surface antibioties for Hepatitis whens he was 18   Hyperthyroidism    Mitral valve prolapse    Multiple sclerosis  Multiple sclerosis    Pneumonia    Tendonitis in rigtht wrist     PAST SURGICAL HISTORY: Past Surgical History:  Procedure Laterality Date   ABDOMINAL HYSTERECTOMY  2004   ovaries retained   BACK SURGERY     heart ablation     15 yrs ago for a fib by Dr.Klein, everything ok   KNEE ARTHROSCOPY WITH SUBCHONDROPLASTY Left 11/13/2017   Procedure: LEFT KNEE ARTHROSCOPY WITH SUBCHONDROPLASTY, PARTIAL MEDIAL MENISCECTOMY, SYNOVECTOMY;  Surgeon: Leandrew Koyanagi, MD;  Location: [facility];  Service: Orthopedics;  Laterality: Left;   THYROIDECTOMY N/A 04/06/2021   Procedure: TOTAL THYROIDECTOMY;  Surgeon: Armandina Gemma, MD;  Location: [facility];  Service: General;  Laterality: N/A;   TUBAL LIGATION      FAMILY HISTORY: Family History  Problem Relation Age of Onset   Depression Mother    Stroke Mother    Heart disease Mother    Dementia Mother    Hypothyroidism Mother    Stroke Father    Heart disease Father    Parkinson's disease Maternal Grandmother    Parkinson's disease Maternal Grandfather    Heart disease Paternal Grandmother    COPD Paternal Grandfather    Healthy Brother    Cancer Neg Hx    Diabetes Neg Hx    Breast cancer Neg Hx     SOCIAL HISTORY:  Social History   Socioeconomic History   Marital status: Divorced    Spouse name: Not on  file   Number of children: Not on file   Years of education: Not on file   Highest education level: Not on file  Occupational History   Not on file  Tobacco Use   Smoking status: Some Days    Packs/day: .25    Types: Cigarettes   Smokeless tobacco: Never  Vaping Use   Vaping Use: Never used  Substance and Sexual Activity   Alcohol use: Yes    Alcohol/week: 0.0 standard drinks of alcohol    Comment: Occasioanl glass of wine    Drug use: No   Sexual activity: Not Currently    Birth control/protection: Surgical  Other Topics Concern   Not on file  Social History Narrative   Left Handed   2-3 Cups of Coffee per Day   Social Determinants of Health   Financial Resource Strain: Not on file  Food Insecurity: Not on file  Transportation Needs: Not on file  Physical Activity: Not on file  Stress: Not on file  Social Connections: Not on file  Intimate Partner Violence: Not on file     PHYSICAL EXAM   BP 122/70   Pulse 76   Ht 5\' 7"  (1.702 m)   Wt 181 lb (82.1 kg)   BMI 28.35 kg/m    General: The patient is well-developed and well-nourished and in no acute distress   Neurologic Exam  Mental status: The patient is alert and oriented x 3 at the time of the examination. The patient has apparent normal recent and remote memory, with an apparently normal attention span and concentration ability.   Speech is normal.  Cranial nerves: She has mild exophthalmos.  Very mild disconjugate gaze looking to the right.  Color vision was symmetric.  Facial strength is normal.  Trapezius strength is normal.  Hearing is normal.  Motor:  Muscle bulk is normal.  Muscle tone is increased in the legs.  She has reduced strength in the right leg relative to the left  Sensory: Sensory  testing is intact to touch and vibration .    Coordination: Cerebellar testing reveals good finger-nose-finger and Heel-to-shin   Gait and station: Station is normal.  She has a mildly foot drop and the tandem  gait is wide.  Romberg is negative.  Reflexes: Deep tendon reflexes are symmetric and normal bilaterally.     DIAGNOSTIC DATA (LABS, IMAGING, TESTING) - I reviewed patient records, labs, notes, testing and imaging myself where available.  Lab Results  Component Value Date   WBC 9.8 03/15/2022   HGB 12.2 03/15/2022   HCT 37.7 03/15/2022   MCV 90 03/15/2022   PLT 242 03/15/2022      Component Value Date/Time   NA 140 04/07/2021 0401   NA 141 12/15/2020 1604   K 3.9 04/07/2021 0401   CL 105 04/07/2021 0401   CO2 28 04/07/2021 0401   GLUCOSE 119 (H) 04/07/2021 0401   BUN 13 04/07/2021 0401   BUN 24 12/15/2020 1604   CREATININE 0.45 04/07/2021 0401   CALCIUM 9.4 04/07/2021 0401   PROT 6.5 03/15/2022 1355   ALBUMIN 4.0 03/15/2022 1355   AST 14 03/15/2022 1355   ALT 11 03/15/2022 1355   ALKPHOS 91 03/15/2022 1355   BILITOT 0.3 03/15/2022 1355   GFRNONAA >60 04/07/2021 0401   GFRAA 136 02/03/2020 0921       ASSESSMENT AND PLAN  MS (multiple sclerosis)  Graves disease  Urinary incontinence, unspecified type  Other fatigue  High risk medication use   1.   COntinue Tecfidera.  We will recheck the CBC and CMP.   2.   Continue to f/u with urology for incontinence.   I'll have her try desmopressin at night for nocturia 3.   She sees endo and ophtho for her thyroid and eye disease.     4.   She will return to see [place] in 6 months or sooner if any new or worsening neurologic symptoms.   Zaynab Chipman A. Felecia Shelling, MD, PhD, Charlynn Grimes [id number], [id number] AM Certified in Neurology, Clinical Neurophysiology, Sleep Medicine, Pain Medicine and Neuroimaging  [facility] [address], [place], [place] [address] [phone]

## 2022-08-23 ENCOUNTER — Other Ambulatory Visit (HOSPITAL_COMMUNITY): Payer: Self-pay

## 2022-08-23 ENCOUNTER — Telehealth (HOSPITAL_COMMUNITY): Payer: Self-pay | Admitting: *Deleted

## 2022-08-23 LAB — COMPREHENSIVE METABOLIC PANEL
ALT: 17 IU/L (ref 0–32)
AST: 16 IU/L (ref 0–40)
Albumin/Globulin Ratio: 2.3 — ABNORMAL HIGH (ref 1.2–2.2)
Albumin: 4.3 g/dL (ref 3.9–4.9)
Alkaline Phosphatase: 111 IU/L (ref 44–121)
BUN/Creatinine Ratio: 37 — ABNORMAL HIGH (ref 12–28)
BUN: 21 mg/dL (ref 8–27)
Bilirubin Total: 0.2 mg/dL (ref 0.0–1.2)
CO2: 25 mmol/L (ref 20–29)
Calcium: 9.1 mg/dL (ref 8.7–10.3)
Chloride: 100 mmol/L (ref 96–106)
Creatinine, Ser: 0.57 mg/dL (ref 0.57–1.00)
Globulin, Total: 1.9 g/dL (ref 1.5–4.5)
Glucose: 92 mg/dL (ref 70–99)
Potassium: 3.6 mmol/L (ref 3.5–5.2)
Sodium: 140 mmol/L (ref 134–144)
Total Protein: 6.2 g/dL (ref 6.0–8.5)
eGFR: 103 mL/min/{1.73_m2} (ref >59)

## 2022-08-23 LAB — CBC WITH DIFFERENTIAL/PLATELET
Basophils Absolute: 0.1 10*3/uL (ref 0.0–0.2)
Basos: 1 %
EOS (ABSOLUTE): 0.3 10*3/uL (ref 0.0–0.4)
Eos: 3 %
Hematocrit: 36.3 % (ref 34.0–46.6)
Hemoglobin: 11.8 g/dL (ref 11.1–15.9)
Immature Grans (Abs): 0 10*3/uL (ref 0.0–0.1)
Immature Granulocytes: 0 %
Lymphocytes Absolute: 3.9 10*3/uL — ABNORMAL HIGH (ref 0.7–3.1)
Lymphs: 38 %
MCH: 29.9 pg (ref 26.6–33.0)
MCHC: 32.5 g/dL (ref 31.5–35.7)
MCV: 92 fL (ref 79–97)
Monocytes Absolute: 0.6 10*3/uL (ref 0.1–0.9)
Monocytes: 6 %
Neutrophils Absolute: 5.2 10*3/uL (ref 1.4–7.0)
Neutrophils: 52 %
Platelets: 323 10*3/uL (ref 150–450)
RBC: 3.95 x10E6/uL (ref 3.77–5.28)
RDW: 13.5 % (ref 11.7–15.4)
WBC: 10.2 10*3/uL (ref 3.4–10.8)

## 2022-08-23 NOTE — Telephone Encounter (Signed)
Pt called requesting a script for Adderall be sent to [facility] on [address]. Pharmacy added to profile. Pt couldn't afford the Lisdexamfetamine or Methylphenidate LA even at [place]. Pt says she can use GoodRx card for Adderall and that is affordable. Pt has f/u scheduled for 09/25/22. Please review and advise,

## 2022-08-24 NOTE — Telephone Encounter (Signed)
Patient was taking Vyvanse which is a long acting.  She need to check if she cannot afford her Adderall XR.  Adderall IR will helpful.

## 2022-08-28 ENCOUNTER — Encounter: Payer: Self-pay | Admitting: Neurology

## 2022-08-28 ENCOUNTER — Other Ambulatory Visit: Payer: Self-pay | Admitting: *Deleted

## 2022-08-28 MED ORDER — METHYLPHENIDATE HCL ER (LA) 10 MG PO CP24: 10.0000 mg | ORAL_CAPSULE | Freq: Every day | ORAL | 0 refills | Status: DC

## 2022-08-29 ENCOUNTER — Other Ambulatory Visit (INDEPENDENT_AMBULATORY_CARE_PROVIDER_SITE_OTHER): Payer: 59

## 2022-08-29 ENCOUNTER — Other Ambulatory Visit: Payer: Self-pay | Admitting: Endocrinology

## 2022-08-29 DIAGNOSIS — E890 Postprocedural hypothyroidism: Secondary | ICD-10-CM | POA: Diagnosis not present

## 2022-08-29 LAB — T4, FREE: Free T4: 1.51 ng/dL (ref 0.60–1.60)

## 2022-08-29 LAB — TSH: TSH: 0.19 u[IU]/mL — ABNORMAL LOW (ref 0.35–5.50)

## 2022-08-31 ENCOUNTER — Other Ambulatory Visit: Payer: Commercial Managed Care - HMO

## 2022-09-03 ENCOUNTER — Other Ambulatory Visit (HOSPITAL_COMMUNITY): Payer: Self-pay

## 2022-09-03 ENCOUNTER — Telehealth: Payer: Self-pay

## 2022-09-03 NOTE — Telephone Encounter (Signed)
I received a request from the Pharmacy to do a PA for Ritalin 10MG  ER capsules-can you verify the diagnosis code and is this for the Brand Ritalin or the generic Methylphenidate? Thanks!

## 2022-09-04 ENCOUNTER — Ambulatory Visit: Payer: Commercial Managed Care - HMO | Admitting: Endocrinology

## 2022-09-13 ENCOUNTER — Ambulatory Visit: Payer: Commercial Managed Care - HMO | Admitting: Neurology

## 2022-09-13 DIAGNOSIS — Z0271 Encounter for disability determination: Secondary | ICD-10-CM

## 2022-09-25 ENCOUNTER — Encounter (HOSPITAL_COMMUNITY): Payer: Self-pay | Admitting: Psychiatry

## 2022-09-25 ENCOUNTER — Telehealth (HOSPITAL_BASED_OUTPATIENT_CLINIC_OR_DEPARTMENT_OTHER): Payer: 59 | Admitting: Psychiatry

## 2022-09-25 VITALS — Wt 181.0 lb

## 2022-09-25 DIAGNOSIS — F900 Attention-deficit hyperactivity disorder, predominantly inattentive type: Secondary | ICD-10-CM

## 2022-09-25 DIAGNOSIS — F330 Major depressive disorder, recurrent, mild: Secondary | ICD-10-CM | POA: Diagnosis not present

## 2022-09-25 MED ORDER — METHYLPHENIDATE HCL ER (LA) 10 MG PO CP24: 10.0000 mg | ORAL_CAPSULE | Freq: Every day | ORAL | 0 refills | Status: DC

## 2022-09-25 NOTE — Progress Notes (Signed)
[facility] MD Virtual Progress Note   Patient Location: Home Provider Location: Home Office  I connect with patient by telephone and verified that I am speaking with correct person by using two identifiers. I discussed the limitations of evaluation and management by telemedicine and the availability of in person appointments. I also discussed with the patient that there may be a patient responsible charge related to this service. The patient expressed understanding and agreed to proceed.  [name] [id number] 62 y.o.  09/25/2022 3:38 PM  History of Present Illness:  Patient is evaluated by phone session.  She struggled with attention, focus and lately more sad depressed and no motivation to do things.  Patient has difficulty getting her Vyvanse as it is backorder.  There is a multiple telephone messages and we ask if she is okay to Adderall XR but in the meantime his neurologist sent Ritalin LA 10 mg to the pharmacy.  Patient is not working at [facility] and cannot afford pharmacy there.  She like to send the prescription to the local pharmacy where she can use her GoodRx coupon.  She was able to get only 10 pills of Ritalin LA 10 mg because again it was backorder.  She admitted those 10 days she was feeling better.  She is able to do multitasking.  She was not as depressed.  She is working as needed at [employer].  Patient like to keep the Ritalin LA 10 mg and hoping she can get the full prescription.  She reported no side effects, concern or issues with the Ritalin.  Her sleep is okay.  She denies any mania, psychosis, hallucination.  Past Psychiatric History: H/O depression and ADD.  Tried Zoloft, Focalin, Ritalin and Strattera.  No history of inpatient or any suicidal attempt.    Outpatient Encounter Medications as of 09/25/2022  Medication Sig   desmopressin (DDAVP) 0.1 MG tablet Take 1 tablet (0.1 mg total) by mouth daily.   Dextran  70-Hypromellose, PF, (LUBRICANT EYE DROPS, PF,) 0.1-0.3 % SOLN Place 1-2 drops into both eyes 3 (three) times daily as needed (dry/irritated eyes.).   Dimethyl Fumarate 240 MG CPDR Take 1 capsule (240 mg total) by mouth 2 (two) times daily.   erythromycin ophthalmic ointment Place 1 inch strip to both eyes every evening.   FLUoxetine (PROZAC) 20 MG capsule Take 3 capsules (60 mg total) by mouth daily.   levothyroxine (SYNTHROID) 125 MCG tablet Take 2.5 tablets (312.5 mcg total) by mouth daily.   Lifitegrast (XIIDRA) 5 % SOLN Place 1 drop into both eyes 2 (two) times daily approximately 12 hours apart (Patient not taking: Reported on 08/22/2022)   lisdexamfetamine (VYVANSE) 50 MG capsule Take 1 capsule (50 mg total) by mouth daily. (Patient not taking: Reported on 08/22/2022)   LORazepam (ATIVAN) 0.5 MG tablet Take 1 tablet (0.5 mg total) by mouth as needed for anxiety.   methylphenidate (RITALIN LA) 10 MG 24 hr capsule Take 1 capsule (10 mg total) by mouth daily.   pantoprazole (PROTONIX) 40 MG tablet Take 1 tablet (40 mg total) by mouth daily.   [DISCONTINUED] atenolol (TENORMIN) 50 MG tablet Take 1 tablet (50 mg total) by mouth daily. (Patient not taking: No sig reported)   No facility-administered encounter medications on file as of 09/25/2022.    Recent Results (from the past 2160 hour(s))  TSH     Status: Abnormal   Collection Time: 07/10/22 11:31 AM  Result Value Ref Range   TSH  32.28 (H) 0.35 - 5.50 uIU/mL  CBC with Differential/Platelet     Status: Abnormal   Collection Time: 08/22/22 12:05 PM  Result Value Ref Range   WBC 10.2 3.4 - 10.8 x10E3/uL   RBC 3.95 3.77 - 5.28 x10E6/uL   Hemoglobin 11.8 11.1 - 15.9 g/dL   Hematocrit 40.9 81.1 - 46.6 %   MCV 92 79 - 97 fL   MCH 29.9 26.6 - 33.0 pg   MCHC 32.5 31.5 - 35.7 g/dL   RDW 91.4 78.2 - 95.6 %   Platelets 323 150 - 450 x10E3/uL   Neutrophils 52 Not Estab. %   Lymphs 38 Not Estab. %   Monocytes 6 Not Estab. %   Eos 3 Not Estab. %    Basos 1 Not Estab. %   Neutrophils Absolute 5.2 1.4 - 7.0 x10E3/uL   Lymphocytes Absolute 3.9 (H) 0.7 - 3.1 x10E3/uL   Monocytes Absolute 0.6 0.1 - 0.9 x10E3/uL   EOS (ABSOLUTE) 0.3 0.0 - 0.4 x10E3/uL   Basophils Absolute 0.1 0.0 - 0.2 x10E3/uL   Immature Granulocytes 0 Not Estab. %   Immature Grans (Abs) 0.0 0.0 - 0.1 x10E3/uL  Comprehensive metabolic panel     Status: Abnormal   Collection Time: 08/22/22 12:05 PM  Result Value Ref Range   Glucose 92 70 - 99 mg/dL   BUN 21 8 - 27 mg/dL   Creatinine, Ser 2.13 0.57 - 1.00 mg/dL   eGFR 086 >57 QI/ONG/2.95   BUN/Creatinine Ratio 37 (H) 12 - 28   Sodium 140 134 - 144 mmol/L   Potassium 3.6 3.5 - 5.2 mmol/L   Chloride 100 96 - 106 mmol/L   CO2 25 20 - 29 mmol/L   Calcium 9.1 8.7 - 10.3 mg/dL   Total Protein 6.2 6.0 - 8.5 g/dL   Albumin 4.3 3.9 - 4.9 g/dL   Globulin, Total 1.9 1.5 - 4.5 g/dL   Albumin/Globulin Ratio 2.3 (H) 1.2 - 2.2   Bilirubin Total 0.2 0.0 - 1.2 mg/dL   Alkaline Phosphatase 111 44 - 121 IU/L   AST 16 0 - 40 IU/L   ALT 17 0 - 32 IU/L  TSH     Status: Abnormal   Collection Time: 08/29/22 12:22 PM  Result Value Ref Range   TSH 0.19 (L) 0.35 - 5.50 uIU/mL  T4, free     Status: None   Collection Time: 08/29/22 12:22 PM  Result Value Ref Range   Free T4 1.51 0.60 - 1.60 ng/dL    Comment: Specimens from patients who are undergoing biotin therapy and /or ingesting biotin supplements may contain high levels of biotin.  The higher biotin concentration in these specimens interferes with this Free T4 assay.  Specimens that contain high levels  of biotin may cause false high results for this Free T4 assay.  Please interpret results in light of the total clinical presentation of the patient.       Psychiatric Specialty Exam: Physical Exam  Review of Systems  Weight 181 lb (82.1 kg).There is no height or weight on file to calculate BMI.  General Appearance: NA  Eye Contact:  NA  Speech:  Clear and Coherent and  Normal Rate  Volume:  Normal  Mood:  Anxious  Affect:  NA  Thought Process:  Goal Directed  Orientation:  Full (Time, Place, and Person)  Thought Content:  Rumination  Suicidal Thoughts:  No  Homicidal Thoughts:  No  Memory:  Immediate;   Good Recent;  Good Remote;   Good  Judgement:  Intact  Insight:  Present  Psychomotor Activity:  NA  Concentration:  Concentration: Fair and Attention Span: Fair  Recall:  Good  Fund of Knowledge:  Good  Language:  Good  Akathisia:  No  Handed:  Right  AIMS (if indicated):     Assets:  Communication Skills Desire for Improvement Housing Resilience  ADL's:  Intact  Cognition:  WNL  Sleep:  fair     Assessment/Plan: MDD (major depressive disorder), recurrent episode, mild (HCC) - Plan: methylphenidate (RITALIN LA) 10 MG 24 hr capsule  Attention deficit hyperactivity disorder (ADHD), predominantly inattentive type - Plan: methylphenidate (RITALIN LA) 10 MG 24 hr capsule  Patient having issues getting her medication on time.  She is taking Prozac prescribed by neurology.  Lately she could not afford Vyvanse brand name and generic was not available as it was backorder.  Her neurologist prescribed 1 time Ritalin LA but she only get 10 pills.  Patient told she has to use the GoodRx coupon because she could not afford brand name medication.  She wants to keep the Ritalin LA as it is generic.  I will send the prescription to the pharmacy she requested.  Recommend to call us back if she has any question or any concern.  Follow-up in 3 months.   Follow Up Instructions:     I discussed the assessment and treatment plan with the patient. The patient was provided an opportunity to ask questions and all were answered. The patient agreed with the plan and demonstrated an understanding of the instructions.   The patient was advised to call back or seek an in-person evaluation if the symptoms worsen or if the condition fails to improve as  anticipated.    Collaboration of Care: Other provider involved in patient's care AEB notes are in epic to review  Patient/Guardian was advised Release of Information must be obtained prior to any record release in order to collaborate their care with an outside provider. Patient/Guardian was advised if they have not already done so to contact the registration department to sign all necessary forms in order for [place] to release information regarding their care.   Consent: Patient/Guardian gives verbal consent for treatment and assignment of benefits for services provided during this visit. Patient/Guardian expressed understanding and agreed to proceed.     I provided 18 minutes of non face to face time during this encounter.  Note: This document was prepared by [employer] voice dictation technology and any errors that results from this process are unintentional.    Cleotis Nipper, MD 09/25/2022

## 2022-10-08 ENCOUNTER — Telehealth: Payer: Self-pay | Admitting: Gastroenterology

## 2022-10-08 NOTE — Telephone Encounter (Signed)
Patient called to schedule a new patient appointment with Dr. Barron Alvine. Has prior history with Dr. Ewing Schlein. She was referred to Dr. Barron Alvine due to her insurance no longer covering Dr. Ewing Schlein. She is having her previous records sent over for review.

## 2022-10-17 ENCOUNTER — Encounter: Payer: Self-pay | Admitting: Neurology

## 2022-10-18 ENCOUNTER — Other Ambulatory Visit: Payer: Self-pay

## 2022-10-18 MED ORDER — FLUOXETINE HCL 20 MG PO CAPS: 60.0000 mg | ORAL_CAPSULE | Freq: Every day | ORAL | 3 refills | Status: DC

## 2022-10-23 ENCOUNTER — Other Ambulatory Visit (HOSPITAL_COMMUNITY): Payer: Self-pay

## 2022-11-16 ENCOUNTER — Other Ambulatory Visit: Payer: Self-pay | Admitting: Endocrinology

## 2022-11-16 ENCOUNTER — Other Ambulatory Visit (INDEPENDENT_AMBULATORY_CARE_PROVIDER_SITE_OTHER): Payer: 59

## 2022-11-16 DIAGNOSIS — E890 Postprocedural hypothyroidism: Secondary | ICD-10-CM

## 2022-11-16 LAB — T4, FREE: Free T4: 1.1 ng/dL (ref 0.60–1.60)

## 2022-11-16 LAB — TSH: TSH: 19.45 u[IU]/mL — ABNORMAL HIGH (ref 0.35–5.50)

## 2022-11-20 ENCOUNTER — Encounter: Payer: Self-pay | Admitting: Endocrinology

## 2022-11-20 ENCOUNTER — Ambulatory Visit: Payer: 59 | Admitting: Endocrinology

## 2022-11-20 VITALS — BP 123/98 | HR 76 | Ht 67.5 in | Wt 185.2 lb

## 2022-11-20 DIAGNOSIS — E0500 Thyrotoxicosis with diffuse goiter without thyrotoxic crisis or storm: Secondary | ICD-10-CM | POA: Diagnosis not present

## 2022-11-20 DIAGNOSIS — E890 Postprocedural hypothyroidism: Secondary | ICD-10-CM | POA: Diagnosis not present

## 2022-11-20 MED ORDER — UNITHROID 175 MCG PO TABS: 350.0000 ug | ORAL_TABLET | Freq: Every day | ORAL | 2 refills | Status: DC

## 2022-11-20 NOTE — Progress Notes (Signed)
Patient ID: [name], female   DOB: [birth year], 62 y.o.   MRN: [id number]                                                                                                               Reason for Appointment:  Hyperthyroidism, follow-up visit   Chief complaint: Follow-up   History of Present Illness:   Baseline history: 4 weeks prior to her initial visit she had symptoms of palpitations, dizziness, feeling excessively warm/sweaty, shakiness of her legs, and weight loss The patient had last 15 about  lbs since these symptoms started Apparently she had gastroenteritis-like symptoms for about 5 days right after her Covid booster shot on 01/20/2020 also associated with significant malaise and weakness She was also feeling like her heart was racing and her breathing was difficult Despite her normal appetite she has lost weight  Initially for treatment she was started on methimazole 10 mg twice daily With this she had resolution of her symptoms of palpitations, breathing difficulty, feeling warm and shakiness Her methimazole dose subsequently had been quite variable with her thyroid levels fluctuating between hyperthyroidism and hypothyroidism  Recent history:  Because of persistent hyperthyroidism along with Graves' ophthalmic disease as well as higher thyrotropin receptor antibody she was recommended thyroidectomy She had total thyroidectomy on 04/06/2021  Since surgery her levothyroxine dosage has been fluctuating markedly and needing frequent adjustment  Previous hypothyroid symptoms had been fatigue, hair loss, trouble concentration or unusual cold intolerance, weight gain   She is currently taking 2-1/2 tablets of the 125 mcg levothyroxine since 06/2022 since her TSH was still high with taking 250 mcg daily Although she had a low TSH in 08/2022 she did not come for her office visit however she had labs Without changing her routine she has now gained a high TSH with the same  dose  She has been quite regular with her supplement before breakfast, usually around 5 AM with no other medications or supplements at the same time; she takes her Protonix about 4 hours later Her dosage History was reviewed in detail  She feels somewhat tired but is also stressed No unusual cold intolerance No TSH is unusually high at 19.5  Ophthalmopathy: She has had blurring of her vision, previously improved with steroid course and was given Alveda Reasons twice but she is feeling worse the last 2 weeks  She is being followed by a new ophthalmologist who thinks that she also has a lot of dry eyes  Wt Readings from Last 3 Encounters:  11/20/22 185 lb 3.2 oz (84 kg)  08/22/22 181 lb (82.1 kg)  03/15/22 164 lb (74.4 kg)    Lab Results  Component Value Date   TSH 19.45 (H) 11/16/2022   TSH 0.19 (L) 08/29/2022   TSH 32.28 (H) 07/10/2022   FREET4 1.10 11/16/2022   FREET4 1.51 08/29/2022   FREET4 0.86 05/18/2022     Lab Results  Component Value Date   THYROTRECAB 9.16 (H) 03/21/2021   THYROTRECAB 10.40 (H) 12/21/2020   THYROTRECAB  17.80 (H) 09/23/2020     Allergies as of 11/20/2022       Reactions   Methylprednisolone Hives   Solu-medrol [methylprednisolone Sodium Succ] Hives, Itching   Wheezing (was getting with Ocrevus as well)   Cephalexin Hives, Itching   Ocrelizumab Palpitations, Other (See Comments)   (Ocrevus) Wheezing.        Medication List        Accurate as of November 20, 2022 10:00 AM. If you have any questions, ask your nurse or doctor.          cyclobenzaprine 5 MG tablet Commonly known as: FLEXERIL Take by mouth as needed.   desmopressin 0.1 MG tablet Commonly known as: DDAVP Take 1 tablet (0.1 mg total) by mouth daily.   diclofenac 75 MG EC tablet Commonly known as: VOLTAREN Take by mouth as needed.   Dimethyl Fumarate 240 MG Cpdr Take 1 capsule (240 mg total) by mouth 2 (two) times daily.   dorzolamide-timolol 2-0.5 % ophthalmic  solution Commonly known as: COSOPT Administer 1 drop to both eyes two (2) times a day.   erythromycin ophthalmic ointment Place 1 inch strip to both eyes every evening.   FLUoxetine 20 MG capsule Commonly known as: PROZAC Take 3 capsules (60 mg total) by mouth daily.   levothyroxine 125 MCG tablet Commonly known as: SYNTHROID Take 2.5 tablets (312.5 mcg total) by mouth daily.   lisdexamfetamine 50 MG capsule Commonly known as: Vyvanse Take 1 capsule (50 mg total) by mouth daily.   LORazepam 0.5 MG tablet Commonly known as: Ativan Take 1 tablet (0.5 mg total) by mouth as needed for anxiety.   Lubricant Eye Drops (PF) 0.1-0.3 % Soln Generic drug: Dextran 70-Hypromellose (PF) Place 1-2 drops into both eyes 3 (three) times daily as needed (dry/irritated eyes.).   methylphenidate 10 MG 24 hr capsule Commonly known as: Ritalin LA Take 1 capsule (10 mg total) by mouth daily.   pantoprazole 40 MG tablet Commonly known as: PROTONIX Take 1 tablet (40 mg total) by mouth daily.   timolol 0.5 % ophthalmic solution Commonly known as: TIMOPTIC 1 drop 2 (two) times daily.   Xiidra 5 % Soln Generic drug: Lifitegrast Place 1 drop into both eyes 2 (two) times daily approximately 12 hours apart            Past Medical History:  Diagnosis Date   ADD (attention deficit disorder with hyperactivity)    Anxiety    Arthritis    Cancer (HCC)    Skin cancer- basal cell, squamous cell   Cervical dysplasia    Family history of adverse reaction to anesthesia    mother due to mitral valve regergitation   GERD (gastroesophageal reflux disease)    Headache    migraine   Hepatitis    Pt states Core and surface antibioties for Hepatitis whens he was 18   Hyperthyroidism    Mitral valve prolapse    Multiple sclerosis (HCC)    Multiple sclerosis (HCC)    Pneumonia    Tendonitis in rigtht wrist     Past Surgical History:  Procedure Laterality Date   ABDOMINAL HYSTERECTOMY  2004    ovaries retained   BACK SURGERY     heart ablation     15 yrs ago for a fib by Dr.Klein, everything ok   KNEE ARTHROSCOPY WITH SUBCHONDROPLASTY Left 11/13/2017   Procedure: LEFT KNEE ARTHROSCOPY WITH SUBCHONDROPLASTY, PARTIAL MEDIAL MENISCECTOMY, SYNOVECTOMY;  Surgeon: Tarry Kos, MD;  Location: [facility];  Service: Orthopedics;  Laterality: Left;   THYROIDECTOMY N/A 04/06/2021   Procedure: TOTAL THYROIDECTOMY;  Surgeon: Darnell Level, MD;  Location: [facility];  Service: General;  Laterality: N/A;   TUBAL LIGATION      Family History  Problem Relation Age of Onset   Depression Mother    Stroke Mother    Heart disease Mother    Dementia Mother    Hypothyroidism Mother    Stroke Father    Heart disease Father    Parkinson's disease Maternal Grandmother    Parkinson's disease Maternal Grandfather    Heart disease Paternal Grandmother    COPD Paternal Grandfather    Healthy Brother    Cancer Neg Hx    Diabetes Neg Hx    Breast cancer Neg Hx     Social History:  reports that she has been smoking cigarettes. She has been smoking an average of .25 packs per day. She has never used smokeless tobacco. She reports current alcohol use. She reports that she does not use drugs.  Allergies:  Allergies  Allergen Reactions   Methylprednisolone Hives   Solu-Medrol [Methylprednisolone Sodium Succ] Hives and Itching    Wheezing (was getting with Ocrevus as well)   Cephalexin Hives and Itching   Ocrelizumab Palpitations and Other (See Comments)    (Ocrevus) Wheezing.     Review of Systems     Examination:   BP (!) 123/98 (BP Location: Right Arm, Patient Position: Sitting, Cuff Size: Large)   Pulse 76   Ht 5' 7.5" (1.715 m)   Wt 185 lb 3.2 oz (84 kg)   SpO2 95%   BMI 28.58 kg/m   She has some swelling around her eyes, significant lid retraction and proptosis and redness of her conjunctiva  Biceps reflexes show mild slow relaxation    Assessment/Plan:  Hypothyroidism postsurgical with history of Graves' disease  She is now on levothyroxine equivalent of 312 mcg daily Again she appears to be requiring relatively high doses of supplement and this is the highest dose she has taken As before without any change in her routine for taking her medication by itself early morning her TSH has gone up and has fluctuated this year with the same dose She has not missed any doses   Since her TSH is again higher than before she will release for now go up on her dose to 350 mcg daily She will try the Unithroid brand if covered Currently Tirosint and liquid thyroxine are not covered by insurance   Follow-up TSH in 6 weeks  Office visit in 4 months and needs to make regular follow-up appointments  Total visit time for evaluation and management and counseling = 30 minutes  Jennica Tagliaferri 11/20/2022, 10:00 AM    Note: This office note was prepared with Dragon voice recognition system technology. Any transcriptional errors that result from this process are unintentional.

## 2022-11-28 ENCOUNTER — Other Ambulatory Visit (HOSPITAL_COMMUNITY): Payer: Self-pay

## 2022-11-30 ENCOUNTER — Other Ambulatory Visit: Payer: Commercial Managed Care - HMO

## 2022-12-04 ENCOUNTER — Ambulatory Visit: Payer: Commercial Managed Care - HMO | Admitting: Endocrinology

## 2022-12-11 ENCOUNTER — Encounter (HOSPITAL_BASED_OUTPATIENT_CLINIC_OR_DEPARTMENT_OTHER): Payer: Self-pay | Admitting: Ophthalmology

## 2022-12-11 ENCOUNTER — Encounter (HOSPITAL_COMMUNITY): Payer: Self-pay | Admitting: Ophthalmology

## 2022-12-11 ENCOUNTER — Other Ambulatory Visit (HOSPITAL_COMMUNITY): Payer: Self-pay | Admitting: Ophthalmology

## 2022-12-11 DIAGNOSIS — E079 Disorder of thyroid, unspecified: Secondary | ICD-10-CM

## 2022-12-11 DIAGNOSIS — H5789 Other specified disorders of eye and adnexa: Secondary | ICD-10-CM

## 2022-12-11 DIAGNOSIS — H468 Other optic neuritis: Secondary | ICD-10-CM

## 2022-12-11 DIAGNOSIS — H543 Unqualified visual loss, both eyes: Secondary | ICD-10-CM

## 2022-12-14 ENCOUNTER — Ambulatory Visit (HOSPITAL_COMMUNITY): Payer: 59

## 2022-12-14 ENCOUNTER — Encounter (HOSPITAL_COMMUNITY): Payer: Self-pay

## 2022-12-22 ENCOUNTER — Ambulatory Visit (HOSPITAL_COMMUNITY)
Admission: RE | Admit: 2022-12-22 | Discharge: 2022-12-22 | Disposition: A | Discharge status: Home / Self Care | Payer: 59 | Source: Ambulatory Visit | Attending: Ophthalmology | Admitting: Ophthalmology

## 2022-12-22 DIAGNOSIS — H468 Other optic neuritis: Secondary | ICD-10-CM | POA: Diagnosis present

## 2022-12-22 DIAGNOSIS — E079 Disorder of thyroid, unspecified: Secondary | ICD-10-CM | POA: Diagnosis present

## 2022-12-22 DIAGNOSIS — H5789 Other specified disorders of eye and adnexa: Secondary | ICD-10-CM

## 2022-12-22 DIAGNOSIS — H543 Unqualified visual loss, both eyes: Secondary | ICD-10-CM | POA: Diagnosis present

## 2022-12-22 MED ORDER — GADOBUTROL 1 MMOL/ML IV SOLN
8.0000 mL | Freq: Once | INTRAVENOUS | Status: AC | PRN
  Administered 2022-12-22: 8 mL via INTRAVENOUS

## 2022-12-24 ENCOUNTER — Encounter (HOSPITAL_COMMUNITY): Payer: Self-pay | Admitting: Psychiatry

## 2022-12-24 ENCOUNTER — Telehealth (HOSPITAL_BASED_OUTPATIENT_CLINIC_OR_DEPARTMENT_OTHER): Payer: 59 | Admitting: Psychiatry

## 2022-12-24 VITALS — Wt 185.0 lb

## 2022-12-24 DIAGNOSIS — F330 Major depressive disorder, recurrent, mild: Secondary | ICD-10-CM

## 2022-12-24 DIAGNOSIS — F410 Panic disorder [episodic paroxysmal anxiety] without agoraphobia: Secondary | ICD-10-CM | POA: Diagnosis not present

## 2022-12-24 DIAGNOSIS — F900 Attention-deficit hyperactivity disorder, predominantly inattentive type: Secondary | ICD-10-CM

## 2022-12-24 DIAGNOSIS — F419 Anxiety disorder, unspecified: Secondary | ICD-10-CM | POA: Diagnosis not present

## 2022-12-24 MED ORDER — LORAZEPAM 0.5 MG PO TABS
0.5000 mg | ORAL_TABLET | ORAL | 1 refills | Status: DC | PRN
Start: 2022-12-24 — End: 2023-03-26

## 2022-12-24 MED ORDER — METHYLPHENIDATE HCL ER (LA) 10 MG PO CP24
10.0000 mg | ORAL_CAPSULE | Freq: Every day | ORAL | 0 refills | Status: DC
Start: 2022-12-24 — End: 2023-06-26

## 2022-12-24 NOTE — Progress Notes (Signed)
[facility] MD Virtual Progress Note   Patient Location: Home Provider Location: Home Office  I connect with patient by telephone and verified that I am speaking with correct person by using two identifiers. I discussed the limitations of evaluation and management by telemedicine and the availability of in person appointments. I also discussed with the patient that there may be a patient responsible charge related to this service. The patient expressed understanding and agreed to proceed.  [name] [id number] 62 y.o.  12/24/2022 3:38 PM  History of Present Illness:  Patient is evaluated by phone session.  She has been not doing very well because of right vision loss.  She quit working because she could not read and drive.  She is frustrated because insurance has been a big issue.  She is waiting long time to get a MRI.  Patient told she is not sure why she is losing eye vision.  She has a good support from her friends from the church.  One of her sons family recently moved to the [place] and sometimes they helps she is taking Ritalin most of the days but not every day.  She started taking Ativan to help her anxiety and usually we gave prescription for 15 tablets in 3 months with 1 more refill.  She is almost out of Ativan.  She denies any crying spells or any feeling of hopelessness or worthlessness.  She denies any suicidal thoughts.  Her appetite is fair.  She using frozen food because she cannot turn the stove on.  She has been out of work since week 9.  She does not want to change the medication since she understand nothing can be done due to her chronic health condition.  I also offered therapy but patient had a good support from church friend.  Past Psychiatric History: H/O depression and ADD.  Tried Zoloft, Focalin, Ritalin and Strattera.  No history of inpatient or any suicidal attempt.    Outpatient Encounter Medications as of 12/24/2022  Medication Sig    cyclobenzaprine (FLEXERIL) 5 MG tablet Take by mouth as needed. (Patient not taking: Reported on 11/20/2022)   Dextran 70-Hypromellose, PF, (LUBRICANT EYE DROPS, PF,) 0.1-0.3 % SOLN Place 1-2 drops into both eyes 3 (three) times daily as needed (dry/irritated eyes.). (Patient not taking: Reported on 11/20/2022)   diclofenac (VOLTAREN) 75 MG EC tablet Take by mouth as needed.   Dimethyl Fumarate 240 MG CPDR Take 1 capsule (240 mg total) by mouth 2 (two) times daily.   dorzolamide-timolol (COSOPT) 2-0.5 % ophthalmic solution Administer 1 drop to both eyes two (2) times a day.   erythromycin ophthalmic ointment Place 1 inch strip to both eyes every evening. (Patient not taking: Reported on 11/20/2022)   FLUoxetine (PROZAC) 20 MG capsule Take 3 capsules (60 mg total) by mouth daily.   Lifitegrast (XIIDRA) 5 % SOLN Place 1 drop into both eyes 2 (two) times daily approximately 12 hours apart (Patient not taking: Reported on 08/22/2022)   lisdexamfetamine (VYVANSE) 50 MG capsule Take 1 capsule (50 mg total) by mouth daily. (Patient not taking: Reported on 08/22/2022)   LORazepam (ATIVAN) 0.5 MG tablet Take 1 tablet (0.5 mg total) by mouth as needed for anxiety.   methylphenidate (RITALIN LA) 10 MG 24 hr capsule Take 1 capsule (10 mg total) by mouth daily.   pantoprazole (PROTONIX) 40 MG tablet Take 1 tablet (40 mg total) by mouth daily.   timolol (TIMOPTIC) 0.5 % ophthalmic solution 1 drop 2 (  two) times daily.   UNITHROID 175 MCG tablet Take 2 tablets (350 mcg total) by mouth daily before breakfast.   [DISCONTINUED] atenolol (TENORMIN) 50 MG tablet Take 1 tablet (50 mg total) by mouth daily. (Patient not taking: No sig reported)   No facility-administered encounter medications on file as of 12/24/2022.    Recent Results (from the past 2160 hour(s))  T4, free     Status: None   Collection Time: 11/16/22 10:16 AM  Result Value Ref Range   Free T4 1.10 0.60 - 1.60 ng/dL    Comment: Specimens from patients who are  undergoing biotin therapy and /or ingesting biotin supplements may contain high levels of biotin.  The higher biotin concentration in these specimens interferes with this Free T4 assay.  Specimens that contain high levels  of biotin may cause false high results for this Free T4 assay.  Please interpret results in light of the total clinical presentation of the patient.    TSH     Status: Abnormal   Collection Time: 11/16/22 10:16 AM  Result Value Ref Range   TSH 19.45 (H) 0.35 - 5.50 uIU/mL     Psychiatric Specialty Exam: Physical Exam  Review of Systems  Weight 185 lb (83.9 kg).There is no height or weight on file to calculate BMI.  General Appearance: NA  Eye Contact:  NA  Speech:  Normal Rate  Volume:  Normal  Mood:  Anxious and Dysphoric  Affect:  NA  Thought Process:  Goal Directed  Orientation:  Full (Time, Place, and Person)  Thought Content:  Rumination  Suicidal Thoughts:  No  Homicidal Thoughts:  No  Memory:  Immediate;   Good Recent;   Good Remote;   Good  Judgement:  Intact  Insight:  Present  Psychomotor Activity:  Decreased  Concentration:  Concentration: Fair and Attention Span: Fair  Recall:  Good  Fund of Knowledge:  Good  Language:  Good  Akathisia:  No  Handed:  Right  AIMS (if indicated):     Assets:  Communication Skills Desire for Improvement Housing Social Support  ADL's:  Intact  Cognition:  WNL  Sleep:  ok     Assessment/Plan: MDD (major depressive disorder), recurrent episode, mild (HCC) - Plan: methylphenidate (RITALIN LA) 10 MG 24 hr capsule, LORazepam (ATIVAN) 0.5 MG tablet  Attention deficit hyperactivity disorder (ADHD), predominantly inattentive type - Plan: methylphenidate (RITALIN LA) 10 MG 24 hr capsule  Anxiety - Plan: LORazepam (ATIVAN) 0.5 MG tablet  Discussed worsening of vision and are not able to work because of could not see.  She feels dependent on other people.  She admitted frustration but she had a good support from  her church.  She does not want to change the medication.  Continue Ritalin LA 0 mg which she takes most of the days and Ativan 0.5 mg as needed for severe anxiety and panic attack.  Recommend to call us back if any questions or any concerns.  Follow-up in 3 months   Follow Up Instructions:     I discussed the assessment and treatment plan with the patient. The patient was provided an opportunity to ask questions and all were answered. The patient agreed with the plan and demonstrated an understanding of the instructions.   The patient was advised to call back or seek an in-person evaluation if the symptoms worsen or if the condition fails to improve as anticipated.    Collaboration of Care: Other provider involved in patient's care AEB  notes are available in epic to review.  Patient/Guardian was advised Release of Information must be obtained prior to any record release in order to collaborate their care with an outside provider. Patient/Guardian was advised if they have not already done so to contact the registration department to sign all necessary forms in order for [place] to release information regarding their care.   Consent: Patient/Guardian gives verbal consent for treatment and assignment of benefits for services provided during this visit. Patient/Guardian expressed understanding and agreed to proceed.     I provided 22 minutes of non face to face time during this encounter.  Note: This document was prepared by [employer] voice dictation technology and any errors that results from this process are unintentional.    Cleotis Nipper, MD 12/24/2022

## 2023-01-09 ENCOUNTER — Other Ambulatory Visit: Payer: 59

## 2023-03-05 ENCOUNTER — Ambulatory Visit (INDEPENDENT_AMBULATORY_CARE_PROVIDER_SITE_OTHER): Payer: 59 | Admitting: Neurology

## 2023-03-05 ENCOUNTER — Encounter: Payer: Self-pay | Admitting: Neurology

## 2023-03-05 VITALS — BP 129/79 | HR 106 | Ht 67.5 in | Wt 194.0 lb

## 2023-03-05 DIAGNOSIS — Z79899 Other long term (current) drug therapy: Secondary | ICD-10-CM

## 2023-03-05 DIAGNOSIS — E0500 Thyrotoxicosis with diffuse goiter without thyrotoxic crisis or storm: Secondary | ICD-10-CM | POA: Diagnosis not present

## 2023-03-05 DIAGNOSIS — H5789 Other specified disorders of eye and adnexa: Secondary | ICD-10-CM

## 2023-03-05 DIAGNOSIS — G35 Multiple sclerosis: Secondary | ICD-10-CM | POA: Diagnosis not present

## 2023-03-05 DIAGNOSIS — E559 Vitamin D deficiency, unspecified: Secondary | ICD-10-CM

## 2023-03-05 DIAGNOSIS — R32 Unspecified urinary incontinence: Secondary | ICD-10-CM

## 2023-03-05 DIAGNOSIS — E079 Disorder of thyroid, unspecified: Secondary | ICD-10-CM

## 2023-03-05 DIAGNOSIS — R5383 Other fatigue: Secondary | ICD-10-CM

## 2023-03-05 NOTE — Progress Notes (Signed)
[facility]  PATIENT: [name] DOB: September 13, 1960     HISTORICAL  CHIEF COMPLAINT:  Chief Complaint  Patient presents with   Follow-up    Pt in room 11. Here for MS follow up on Tecfidera. Pt report really well. No MS concerns. Pt recently diagnosed with thyroid eye disease and Graves disease.     HISTORY OF PRESENT ILLNESS:  [name] is a 62 y.o.woman with relapsing remitting multiple sclerosis.   Update 03/05/2023 She has Grave's disease (has Thyrotropin receptor Abs).  Her right eye vision is progressively worsenig.   She has diplopia.  She is on methimazole.  She also did Tepeza (had 8 infusion over 2 years) with benefit - less diplopia.   She ended up needing orbital decompression bilaterally due to concern about her optic nerves.    There is not a lot of data about additional courses if needed so not sure if she will do the drug again.   She has had eyelid surgery.   Recent TSH was 19 (11/16/2022) She is now on Unithroid instead of Synthroid.  She has had thyroidectomy. Vision is still impaired.     She sees ophthalmology (Dr. Delynn Flavin and also Hwong at [facility])  and endocrinology regularly (Now Dr. Jake Samples; was Dr. Lucianne Muss)  She stopped Vumerity and switched to DMF due to insurance issues.   No exacerbations > 10 year.  However, MRI 03/02/2019 showed one new lesion.  MRI 2021 showed no new lesion  She was allergic to Ocrevus and had multiple skin cancers (basal and squamous) while on Gilenya (still has had some on other medications).   She has no recent skin lesions.     She was never on [place].   Symptoms started after he Covid booster.  Gait is mildly of balanced she feels due to reduced depth perception more than MS.  No falls.  No weakness.  She has s sensation like socks rolled up under her toes/distal foot.  She has urinary incontinence that is stable but not controlled..   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    Not on an medication for bladder or bowel now.   She has extreme urgency with both and can't always get to a facility in time.   She has nocturia 3-4 times most nights.  She wears pads.      She ihas some mental fog.   Vyvanse helps ADD.   Due to naitonal shortage she has not been on any.   She is working part-time 16-20 hours only.  She has some fatigue.  She is sleeping better but has nocturia 2-3 rimes nightly..  She has depression but feels fluoxetine has been helpful.  She is frustrated as can't drive, read or do many tasks due to poor vision.  MS history: She was diagnosed with relapsing remitting MS around 2000 after presenting with numbness in the hands and feet.  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 She was admitted to the hospital. She then was diagnosed with MS and is to see Dr. Leotis Shames at [facility]. He started her on Betaseron.   She did fairly well on Betaseron. She did have several small exacerbations and received some steroids a couple times. Often the exacerbations would be associated with headache as well. About 4 years ago, she opted to switch to Gilenya to  have oral therapy for the MS. She has done very well on that medication without any exacerbations.  Her June 2015 MRI did show one focus that was not present on her MRI from March 2010.  Due to several skin cancers, she switched to Copaxone in 2018 but had breakthrough activity.  She switched to Ocrevus in 2019 but had difficulty tolerating the medication.  She switched to Vumerity in early 2020.  Recent imaging: MRI brain 03/02/2019 showed a "new lesion involving the right superior temporal gyrus consistent with progression of disease.    Increased prominence of lesions in the subcortical white matter of the cingulate gyrus bilaterally, right greater than left. This could impact symptoms in the feet or lower extremities and is  also consistent with progression of disease.  Progressive signal change in the left sylvian fissure. No enhancement or restricted diffusion to suggest active demyelination."  MRI of the cervical spine 02/24/2004 showed a normal spinal cord and a disc protrusion to the right at C5-C6 causing right C6 nerve root encroachment.  MRI of the thoracic spine 08/16/2005 showed disc protrusion at T9-T10 and milder protrusions elsewhere but no evidence of demyelination  ALLERGIES: Allergies  Allergen Reactions   Methylprednisolone Hives   Solu-Medrol [Methylprednisolone Sodium Succ] Hives and Itching    Wheezing (was getting with Ocrevus as well)   Cephalexin Hives and Itching   Ocrelizumab Palpitations and Other (See Comments)    (Ocrevus) Wheezing.    HOME MEDICATIONS:  Current Outpatient Medications:    Dimethyl Fumarate 240 MG CPDR, Take 1 capsule (240 mg total) by mouth 2 (two) times daily., Disp: 180 capsule, Rfl: 3   erythromycin ophthalmic ointment, Place 1 inch strip to both eyes every evening., Disp: 3.5 g, Rfl: 11   FLUoxetine (PROZAC) 20 MG capsule, Take 3 capsules (60 mg total) by mouth daily., Disp: 270 capsule, Rfl: 3   LORazepam (ATIVAN) 0.5 MG tablet, Take 1 tablet (0.5 mg total) by mouth as needed for anxiety., Disp: 15 tablet, Rfl: 1   methylphenidate (RITALIN LA) 10 MG 24 hr capsule, Take 1 capsule (10 mg total) by mouth daily., Disp: 30 capsule, Rfl: 0   pantoprazole (PROTONIX) 40 MG tablet, Take 1 tablet (40 mg total) by mouth daily., Disp: 90 tablet, Rfl: 3   timolol (TIMOPTIC) 0.5 % ophthalmic solution, 1 drop 2 (two) times daily., Disp: , Rfl:    UNITHROID 175 MCG tablet, Take 2 tablets (350 mcg total) by mouth daily before breakfast., Disp: 60 tablet, Rfl: 2   cyclobenzaprine (FLEXERIL) 5 MG tablet, Take by mouth as needed. (Patient not taking: Reported on 11/20/2022), Disp: , Rfl:    Dextran 70-Hypromellose, PF, (LUBRICANT EYE DROPS, PF,) 0.1-0.3 % SOLN, Place 1-2 drops into  both eyes 3 (three) times daily as needed (dry/irritated eyes.). (Patient not taking: Reported on 11/20/2022), Disp: , Rfl:    diclofenac (VOLTAREN) 75 MG EC tablet, Take by mouth as needed. (Patient not taking: Reported on 03/05/2023), Disp: , Rfl:    dorzolamide-timolol (COSOPT) 2-0.5 % ophthalmic solution, Administer 1 drop to both eyes two (2) times a day., Disp: , Rfl:    Lifitegrast (XIIDRA) 5 % SOLN, Place 1 drop into both eyes 2 (two) times daily approximately 12 hours apart (Patient not taking: Reported on 08/22/2022), Disp: 60 each, Rfl: 11   lisdexamfetamine (VYVANSE) 50 MG capsule, Take 1 capsule (50 mg total) by mouth daily. (Patient not taking: Reported on 08/22/2022), Disp: 30 capsule, Rfl: 0  PAST MEDICAL HISTORY: Past  Medical History:  Diagnosis Date   ADD (attention deficit disorder with hyperactivity)    Anxiety    Arthritis    Cancer (HCC)    Skin cancer- basal cell, squamous cell   Cervical dysplasia    Family history of adverse reaction to anesthesia    mother due to mitral valve regergitation   GERD (gastroesophageal reflux disease)    Graves disease    Headache    migraine   Hepatitis    Pt states Core and surface antibioties for Hepatitis whens he was 18   Hyperthyroidism    Mitral valve prolapse    Multiple sclerosis (HCC)    Multiple sclerosis (HCC)    Pneumonia    Tendonitis in rigtht wrist    Thyroid eye disease     PAST SURGICAL HISTORY: Past Surgical History:  Procedure Laterality Date   ABDOMINAL HYSTERECTOMY  2004   ovaries retained   BACK SURGERY     heart ablation     15 yrs ago for a fib by Dr.Klein, everything ok   KNEE ARTHROSCOPY WITH SUBCHONDROPLASTY Left 11/13/2017   Procedure: LEFT KNEE ARTHROSCOPY WITH SUBCHONDROPLASTY, PARTIAL MEDIAL MENISCECTOMY, SYNOVECTOMY;  Surgeon: Tarry Kos, MD;  Location: [facility];  Service: Orthopedics;  Laterality: Left;   THYROIDECTOMY N/A 04/06/2021   Procedure: TOTAL THYROIDECTOMY;   Surgeon: Darnell Level, MD;  Location: [facility];  Service: General;  Laterality: N/A;   TUBAL LIGATION      FAMILY HISTORY: Family History  Problem Relation Age of Onset   Depression Mother    Stroke Mother    Heart disease Mother    Dementia Mother    Hypothyroidism Mother    Stroke Father    Heart disease Father    Parkinson's disease Maternal Grandmother    Parkinson's disease Maternal Grandfather    Heart disease Paternal Grandmother    COPD Paternal Grandfather    Healthy Brother    Cancer Neg Hx    Diabetes Neg Hx    Breast cancer Neg Hx     SOCIAL HISTORY:  Social History   Socioeconomic History   Marital status: Divorced    Spouse name: Not on file   Number of children: Not on file   Years of education: Not on file   Highest education level: Not on file  Occupational History   Not on file  Tobacco Use   Smoking status: Some Days    Current packs/day: 0.25    Types: Cigarettes   Smokeless tobacco: Never  Vaping Use   Vaping status: Never Used  Substance and Sexual Activity   Alcohol use: Yes    Alcohol/week: 0.0 standard drinks of alcohol    Comment: Occasioanl glass of wine    Drug use: No   Sexual activity: Not Currently    Birth control/protection: Surgical  Other Topics Concern   Not on file  Social History Narrative   Left Handed   2-3 Cups of Coffee per Day   Social Determinants of Health   Financial Resource Strain: Not on file  Food Insecurity: Not on file  Transportation Needs: Not on file  Physical Activity: Not on file  Stress: Not on file  Social Connections: Not on file  Intimate Partner Violence: Not on file     PHYSICAL EXAM   BP 129/79 (BP Location: Right Arm, Patient Position: Sitting, Cuff Size: Normal)   Pulse (!) 106   Ht 5' 7.5" (1.715 m)   Wt 194 lb (  88 kg)   BMI 29.94 kg/m    General: The patient is well-developed and well-nourished and in no acute distress   Neurologic Exam  Mental status: The patient is  alert and oriented x 3 at the time of the examination. The patient has apparent normal recent and remote memory, with an apparently normal attention span and concentration ability.   Speech is normal.  Cranial nerves: She has mild exophthalmos.  Very mild disconjugate gaze looking to the right.  Color vision was symmetric.  Facial strength is normal.  Trapezius strength is normal.  Hearing is normal.  Motor:  Muscle bulk is normal.  Muscle tone is increased in the legs.  She has reduced strength in the right leg relative to the left  Sensory: Sensory testing is intact to touch and vibration .    Coordination: Cerebellar testing reveals good finger-nose-finger and Heel-to-shin   Gait and station: Station is normal.  She has a mildly foot drop and the tandem gait is wide.  Romberg is negative.  Reflexes: Deep tendon reflexes are symmetric and normal bilaterally.     DIAGNOSTIC DATA (LABS, IMAGING, TESTING) - I reviewed patient records, labs, notes, testing and imaging myself where available.  Lab Results  Component Value Date   WBC 10.2 08/22/2022   HGB 11.8 08/22/2022   HCT 36.3 08/22/2022   MCV 92 08/22/2022   PLT 323 08/22/2022      Component Value Date/Time   NA 140 08/22/2022 1205   K 3.6 08/22/2022 1205   CL 100 08/22/2022 1205   CO2 25 08/22/2022 1205   GLUCOSE 92 08/22/2022 1205   GLUCOSE 119 (H) 04/07/2021 0401   BUN 21 08/22/2022 1205   CREATININE 0.57 08/22/2022 1205   CALCIUM 9.1 08/22/2022 1205   PROT 6.2 08/22/2022 1205   ALBUMIN 4.3 08/22/2022 1205   AST 16 08/22/2022 1205   ALT 17 08/22/2022 1205   ALKPHOS 111 08/22/2022 1205   BILITOT 0.2 08/22/2022 1205   GFRNONAA >60 04/07/2021 0401   GFRAA 136 02/03/2020 0921       ASSESSMENT AND PLAN  MS (multiple sclerosis) (HCC) - Plan: CBC with Differential/Platelet, Comprehensive metabolic panel  High risk medication use - Plan: CBC with Differential/Platelet, Comprehensive metabolic panel  Vitamin D  deficiency - Plan: VITAMIN D 25 Hydroxy (Vit-D Deficiency, Fractures)  Graves disease  Thyroid eye disease  Other fatigue  Urinary incontinence, unspecified type   1.   COntinue Tecfidera.  We will recheck the CBC and CMP.   2.   Continue to f/u with urology for incontinence.   I'll have her try desmopressin at night for nocturia 3.   She sees endo and ophtho for her thyroid and eye disease.     4.   She will return to see [place] in 6 months or sooner if any new or worsening neurologic symptoms.   Terianne Thaker A. Epimenio Foot, MD, PhD, [place] 03/05/2023, 1:32 PM Certified in Neurology, Clinical Neurophysiology, Sleep Medicine, Pain Medicine and Neuroimaging  [facility] [address] [place], [place] [address] [phone]

## 2023-03-06 ENCOUNTER — Other Ambulatory Visit: Payer: Self-pay | Admitting: Neurology

## 2023-03-06 LAB — CBC WITH DIFFERENTIAL/PLATELET
Basophils Absolute: 0.1 10*3/uL (ref 0.0–0.2)
Basos: 1 %
EOS (ABSOLUTE): 0.3 10*3/uL (ref 0.0–0.4)
Eos: 2 %
Hematocrit: 39.7 % (ref 34.0–46.6)
Hemoglobin: 12.5 g/dL (ref 11.1–15.9)
Immature Grans (Abs): 0.1 10*3/uL (ref 0.0–0.1)
Immature Granulocytes: 1 %
Lymphocytes Absolute: 1.9 10*3/uL (ref 0.7–3.1)
Lymphs: 12 %
MCH: 28.5 pg (ref 26.6–33.0)
MCHC: 31.5 g/dL (ref 31.5–35.7)
MCV: 91 fL (ref 79–97)
Monocytes Absolute: 0.6 10*3/uL (ref 0.1–0.9)
Monocytes: 4 %
Neutrophils Absolute: 12.8 10*3/uL — ABNORMAL HIGH (ref 1.4–7.0)
Neutrophils: 80 %
Platelets: 345 10*3/uL (ref 150–450)
RBC: 4.38 x10E6/uL (ref 3.77–5.28)
RDW: 12.7 % (ref 11.7–15.4)
WBC: 15.7 10*3/uL — ABNORMAL HIGH (ref 3.4–10.8)

## 2023-03-06 LAB — COMPREHENSIVE METABOLIC PANEL
ALT: 28 [IU]/L (ref 0–32)
AST: 23 [IU]/L (ref 0–40)
Albumin: 4.3 g/dL (ref 3.9–4.9)
Alkaline Phosphatase: 105 [IU]/L (ref 44–121)
BUN/Creatinine Ratio: 26 (ref 12–28)
BUN: 17 mg/dL (ref 8–27)
Bilirubin Total: 0.3 mg/dL (ref 0.0–1.2)
CO2: 21 mmol/L (ref 20–29)
Calcium: 9.6 mg/dL (ref 8.7–10.3)
Chloride: 102 mmol/L (ref 96–106)
Creatinine, Ser: 0.66 mg/dL (ref 0.57–1.00)
Globulin, Total: 2.5 g/dL (ref 1.5–4.5)
Glucose: 106 mg/dL — ABNORMAL HIGH (ref 70–99)
Potassium: 4.2 mmol/L (ref 3.5–5.2)
Sodium: 140 mmol/L (ref 134–144)
Total Protein: 6.8 g/dL (ref 6.0–8.5)
eGFR: 99 mL/min/{1.73_m2} (ref >59)

## 2023-03-06 LAB — VITAMIN D 25 HYDROXY (VIT D DEFICIENCY, FRACTURES): Vit D, 25-Hydroxy: 8 ng/mL — ABNORMAL LOW (ref 30.0–100.0)

## 2023-03-06 MED ORDER — VITAMIN D (ERGOCALCIFEROL) 1.25 MG (50000 UNIT) PO CAPS: 50000.0000 [IU] | ORAL_CAPSULE | ORAL | 3 refills | Status: DC

## 2023-03-14 ENCOUNTER — Other Ambulatory Visit: Payer: 59

## 2023-03-14 DIAGNOSIS — E890 Postprocedural hypothyroidism: Secondary | ICD-10-CM | POA: Diagnosis not present

## 2023-03-14 LAB — TSH: TSH: 0.02 u[IU]/mL — ABNORMAL LOW (ref 0.35–5.50)

## 2023-03-14 LAB — T4, FREE: Free T4: 2.71 ng/dL — ABNORMAL HIGH (ref 0.60–1.60)

## 2023-03-20 ENCOUNTER — Ambulatory Visit: Payer: 59 | Admitting: Endocrinology

## 2023-03-20 ENCOUNTER — Encounter: Payer: Self-pay | Admitting: Endocrinology

## 2023-03-20 VITALS — BP 120/80 | HR 95 | Ht 67.5 in | Wt 196.0 lb

## 2023-03-20 DIAGNOSIS — E0500 Thyrotoxicosis with diffuse goiter without thyrotoxic crisis or storm: Secondary | ICD-10-CM | POA: Diagnosis not present

## 2023-03-20 DIAGNOSIS — Z8639 Personal history of other endocrine, nutritional and metabolic disease: Secondary | ICD-10-CM | POA: Diagnosis not present

## 2023-03-20 DIAGNOSIS — E890 Postprocedural hypothyroidism: Secondary | ICD-10-CM

## 2023-03-20 MED ORDER — LEVOTHYROXINE SODIUM 300 MCG PO TABS: 300.0000 ug | ORAL_TABLET | Freq: Every day | ORAL | 3 refills | Status: DC

## 2023-03-20 NOTE — Patient Instructions (Signed)
Decrease unithyroid to 300 mcg daily.  Thyroid lab in 2 months.

## 2023-03-20 NOTE — Progress Notes (Signed)
Outpatient Endocrinology Note [place] Oronde Hallenbeck, MD  03/20/23  Patient's Name: [name]    DOB: 1960/09/15    MRN: [id number]  REASON OF VISIT: Follow-up for postsurgical hypothyroidism  PCP: Pcp, No  HISTORY OF PRESENT ILLNESS:   [name] is a 62 y.o. old female with past medical history as listed below is presented for a follow up of postsurgical hypothyroidism.  Patient was last seen by Dr. Lucianne Muss in July 2024.  Pertinent Thyroid History: Patient was diagnosed with Graves' disease in October 2021, initially had typical symptoms of palpitation, dizziness, heat intolerance, and tremors and weight loss.  She had the symptoms after she had COVID booster shot in September 2021.  She was initially treated with methimazole 10 mg 2 times a day.  She had variable thyroid hormone levels fluctuating between hyper and hypothyroidism on methimazole.  Because of persistent hyperthyroidism along with Graves' ophthalmopathy, was recommended for thyroidectomy and underwent total thyroidectomy in April 06, 2021.  Pathology was benign consistent with multinodular hyperplasia, negative for malignancy.  Patient was started on levothyroxine after the surgery and require frequent adjustment of the dose.  Patient has required relatively high dose of levothyroxine.  She was on levothyroxine up to 250 mcg and then to 312 mcg daily.  In June 2024 she had TSH of 19, levothyroxine/Synthroid was sent to unithyroid 350 mcg daily in July 2024.  Tirosint and liquid thyroxine were not covered by insurance.  Graves' eye disease [name]' ophthalmopathy : Patient has been following at [facility].  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  Interval history 03/20/23 Patient has been taking unithyroid 175 mcg 2 tablets daily in the morning before breakfast and reports compliance.  She denies taking any vitamins/calcium or supplements in  the morning.  She denies palpitation or heat intolerance.  Overall feeling good.  Recent thyroid lab as follows with low TSH and elevated free T4 consistent with over replacement of thyroid hormone.   Latest Reference Range & Units 11/16/22 10:16 03/14/23 09:57  TSH 0.35 - 5.50 uIU/mL 19.45 (H) 0.02 Repeated and verified X2. (L)  T4,Free(Direct) 0.60 - 1.60 ng/dL 6.44 0.34 (H)  (H): Data is abnormally high (L): Data is abnormally low  REVIEW OF SYSTEMS:  As per history of present illness.   PAST MEDICAL HISTORY: Past Medical History:  Diagnosis Date   ADD (attention deficit disorder with hyperactivity)    Anxiety    Arthritis    Cancer (HCC)    Skin cancer- basal cell, squamous cell   Cervical dysplasia    Family history of adverse reaction to anesthesia    mother due to mitral valve regergitation   GERD (gastroesophageal reflux disease)    Graves disease    Headache    migraine   Hepatitis    Pt states Core and surface antibioties for Hepatitis whens he was 18   Hyperthyroidism    Mitral valve prolapse    Multiple sclerosis (HCC)    Multiple sclerosis (HCC)    Pneumonia    Tendonitis in rigtht wrist    Thyroid eye disease     PAST SURGICAL HISTORY: Past Surgical History:  Procedure Laterality Date   ABDOMINAL HYSTERECTOMY  2004   ovaries retained   BACK SURGERY     heart ablation     15 yrs ago for a fib by Dr.Klein, everything ok   KNEE ARTHROSCOPY WITH SUBCHONDROPLASTY Left 11/13/2017  Procedure: LEFT KNEE ARTHROSCOPY WITH SUBCHONDROPLASTY, PARTIAL MEDIAL MENISCECTOMY, SYNOVECTOMY;  Surgeon: Tarry Kos, MD;  Location: [facility];  Service: Orthopedics;  Laterality: Left;   THYROIDECTOMY N/A 04/06/2021   Procedure: TOTAL THYROIDECTOMY;  Surgeon: Darnell Level, MD;  Location: [facility];  Service: General;  Laterality: N/A;   TUBAL LIGATION      ALLERGIES: Allergies  Allergen Reactions   Methylprednisolone Hives   Solu-Medrol [Methylprednisolone  Sodium Succ] Hives and Itching    Wheezing (was getting with Ocrevus as well)   Cephalexin Hives and Itching   Ocrelizumab Palpitations and Other (See Comments)    (Ocrevus) Wheezing.    FAMILY HISTORY:  Family History  Problem Relation Age of Onset   Depression Mother    Stroke Mother    Heart disease Mother    Dementia Mother    Hypothyroidism Mother    Stroke Father    Heart disease Father    Parkinson's disease Maternal Grandmother    Parkinson's disease Maternal Grandfather    Heart disease Paternal Grandmother    COPD Paternal Grandfather    Healthy Brother    Cancer Neg Hx    Diabetes Neg Hx    Breast cancer Neg Hx     SOCIAL HISTORY: Social History   Socioeconomic History   Marital status: Divorced    Spouse name: Not on file   Number of children: Not on file   Years of education: Not on file   Highest education level: Not on file  Occupational History   Not on file  Tobacco Use   Smoking status: Some Days    Current packs/day: 0.25    Types: Cigarettes   Smokeless tobacco: Never  Vaping Use   Vaping status: Never Used  Substance and Sexual Activity   Alcohol use: Yes    Alcohol/week: 0.0 standard drinks of alcohol    Comment: Occasioanl glass of wine    Drug use: No   Sexual activity: Not Currently    Birth control/protection: Surgical  Other Topics Concern   Not on file  Social History Narrative   Left Handed   2-3 Cups of Coffee per Day   Social Determinants of Health   Financial Resource Strain: Not on file  Food Insecurity: Not on file  Transportation Needs: Not on file  Physical Activity: Not on file  Stress: Not on file  Social Connections: Not on file    MEDICATIONS:  Current Outpatient Medications  Medication Sig Dispense Refill   Dimethyl Fumarate 240 MG CPDR Take 1 capsule (240 mg total) by mouth 2 (two) times daily. 180 capsule 3   dorzolamide-timolol (COSOPT) 2-0.5 % ophthalmic solution Administer 1 drop to both eyes two (2)  times a day.     erythromycin ophthalmic ointment Place 1 inch strip to both eyes every evening. 3.5 g 11   FLUoxetine (PROZAC) 20 MG capsule Take 3 capsules (60 mg total) by mouth daily. 270 capsule 3   levothyroxine (UNITHROID) 300 MCG tablet Take 1 tablet (300 mcg total) by mouth daily before breakfast. 90 tablet 3   LORazepam (ATIVAN) 0.5 MG tablet Take 1 tablet (0.5 mg total) by mouth as needed for anxiety. 15 tablet 1   methylphenidate (RITALIN LA) 10 MG 24 hr capsule Take 1 capsule (10 mg total) by mouth daily. 30 capsule 0   neomycin-polymyxin b-dexamethasone (MAXITROL) 3.5-10000-0.1 SUSP Administer 1 drop to both eyes four (4) times a day.     oxyCODONE (OXY IR/ROXICODONE) 5 MG  immediate release tablet Take 5 mg by mouth 4 (four) times daily as needed.     pantoprazole (PROTONIX) 40 MG tablet Take 1 tablet (40 mg total) by mouth daily. 90 tablet 3   timolol (TIMOPTIC) 0.5 % ophthalmic solution 1 drop 2 (two) times daily.     Vitamin D, Ergocalciferol, (DRISDOL) 1.25 MG (50000 UNIT) CAPS capsule Take 1 capsule (50,000 Units total) by mouth every 7 (seven) days. 13 capsule 3   cyclobenzaprine (FLEXERIL) 5 MG tablet Take by mouth as needed. (Patient not taking: Reported on 11/20/2022)     Dextran 70-Hypromellose, PF, (LUBRICANT EYE DROPS, PF,) 0.1-0.3 % SOLN Place 1-2 drops into both eyes 3 (three) times daily as needed (dry/irritated eyes.). (Patient not taking: Reported on 11/20/2022)     diclofenac (VOLTAREN) 75 MG EC tablet Take by mouth as needed. (Patient not taking: Reported on 03/05/2023)     Lifitegrast (XIIDRA) 5 % SOLN Place 1 drop into both eyes 2 (two) times daily approximately 12 hours apart (Patient not taking: Reported on 08/22/2022) 60 each 11   lisdexamfetamine (VYVANSE) 50 MG capsule Take 1 capsule (50 mg total) by mouth daily. (Patient not taking: Reported on 08/22/2022) 30 capsule 0   predniSONE (DELTASONE) 20 MG tablet Take 60 mg by mouth daily. (Patient not taking: Reported on  03/20/2023)     No current facility-administered medications for this visit.    PHYSICAL EXAM: Vitals:   03/20/23 1253  BP: 120/80  Pulse: 95  SpO2: 95%  Weight: 196 lb (88.9 kg)  Height: 5' 7.5" (1.715 m)   Body mass index is 30.24 kg/m.  Wt Readings from Last 3 Encounters:  03/20/23 196 lb (88.9 kg)  03/05/23 194 lb (88 kg)  11/20/22 185 lb 3.2 oz (84 kg)     General: Well developed, well nourished female in no apparent distress.  HEENT: AT/Pine, no external lesions. Hearing intact to the spoken word Eyes: Right eye postoperative changes, proptosis +, no redness or wartering.  Neck: Trachea midline, neck supple, anterior neck scar + s/p thyroidectomy.  Lungs: Clear to auscultation, no wheeze. Respirations not labored Heart: S1S2, Regular in rate and rhythm.  Abdomen: Soft, non tender, non distended Neurologic: Alert, oriented, normal speech, deep tendon biceps reflexes normal,  no gross focal neurological deficit Extremities: No pedal pitting edema, no tremors of outstretched hands Skin: Warm, color good.  Psychiatric: Does not appear depressed or anxious  PERTINENT HISTORIC LABORATORY AND IMAGING STUDIES:  All pertinent laboratory results were reviewed. Please see HPI also for further details.   TSH  Date Value Ref Range Status  03/14/2023 0.02 Repeated and verified X2. (L) 0.35 - 5.50 uIU/mL Final  11/16/2022 19.45 (H) 0.35 - 5.50 uIU/mL Final  08/29/2022 0.19 (L) 0.35 - 5.50 uIU/mL Final   T3, Total  Date Value Ref Range Status  02/03/2020 321 (H) 71 - 180 ng/dL Final     ASSESSMENT / PLAN  1. Postoperative hypothyroidism   2. H/O Graves' disease   3. Graves' ophthalmopathy    -Patient has history of Graves' disease status post total thyroidectomy in November 2022.  Patient has postoperative hypothyroidism.  She has been requiring relatively high dose of thyroid hormone replacement.  She used to be on levothyroxine and changed to unithyroid from July  2024. -Currently taking unithyroid 350 mcg daily.  Recent thyroid lab consistent with over replacement of thyroid hormone.  Plan: -Decreased unithyroid to 300 mcg daily. -Check thyroid function test TSH, free T4 in 2  months.  # [name]' eye disease follow-up with ophthalmology at [facility].  We discussed the medical need for compliance with levothyroxine therapy, that it is a hormone necessary for life, and that serious consequences may result from noncompliance. Discussed the proper method of levothyroxine administration: take on an empty stomach in the morning, with water, waiting thirty to sixty minutes before taking any other beverages or food. Also reviewed the need to take calcium or iron supplements or multivitamin (that may contain iron or calcium) at least 4 hours after levothyroxine administration.   [name] was seen today for follow-up.  Diagnoses and all orders for this visit:  Postoperative hypothyroidism -     T4, free; Future -     TSH; Future  H/O Graves' disease  Graves' ophthalmopathy  Other orders -     levothyroxine (UNITHROID) 300 MCG tablet; Take 1 tablet (300 mcg total) by mouth daily before breakfast.    DISPOSITION Follow up in clinic in 4 months suggested.  All questions answered and patient verbalized understanding of the plan.  [place] Vianey Caniglia, MD [facility] [address] [place], [address] [place], [place] [address] Phone # [phone]  At least part of this note was generated using voice recognition software. Inadvertent word errors may have occurred, which were not recognized during the proofreading process.

## 2023-03-26 ENCOUNTER — Telehealth (HOSPITAL_BASED_OUTPATIENT_CLINIC_OR_DEPARTMENT_OTHER): Payer: 59 | Admitting: Psychiatry

## 2023-03-26 ENCOUNTER — Encounter (HOSPITAL_COMMUNITY): Payer: Self-pay | Admitting: Psychiatry

## 2023-03-26 VITALS — Wt 196.0 lb

## 2023-03-26 DIAGNOSIS — F330 Major depressive disorder, recurrent, mild: Secondary | ICD-10-CM | POA: Diagnosis not present

## 2023-03-26 DIAGNOSIS — F900 Attention-deficit hyperactivity disorder, predominantly inattentive type: Secondary | ICD-10-CM

## 2023-03-26 DIAGNOSIS — F419 Anxiety disorder, unspecified: Secondary | ICD-10-CM

## 2023-03-26 MED ORDER — LORAZEPAM 0.5 MG PO TABS: 0.5000 mg | ORAL_TABLET | ORAL | 1 refills | Status: DC | PRN

## 2023-03-26 NOTE — Progress Notes (Signed)
[facility] MD Virtual Progress Note   Patient Location: Home Provider Location: Home Office  I connect with patient by telephone and verified that I am speaking with correct person by using two identifiers. I discussed the limitations of evaluation and management by telemedicine and the availability of in person appointments. I also discussed with the patient that there may be a patient responsible charge related to this service. The patient expressed understanding and agreed to proceed.  [name] [id number] 62 y.o.  03/26/2023 9:59 AM  History of Present Illness:  Patient is evaluated by phone session.  She admitted moderate dysphoria, anxiety and frustration.  She had an MRI after waiting for long time.  She had a procedure at [facility] and human it was preapproved from insurance now she is having issues for reimbursement.  She has to pay out of pocket.  She admitted a lot of frustration with insurance company.  She also reported her vision continued to decline and after the procedure not sure if she able to drive again in the future.  She is very dependent on other people.  She does not like asking help from people.  She reported it is a big adjustment in her daily routine.  She uses  [employer] and doctor's appointment.  She usually asked grocery store to deliver the groceries.  She reported sometimes difficulty falling asleep, anxiety.  She is taking Ritalin once or twice a week when she has too many things to do.  She admitted taking lorazepam which is keeping her anxiety somewhat better.  She is also consistent with Prozac prescribed by neurology.  She recently had a visit with neurology and endocrinology.  Her thyroid level are normal and she is seeing a new endocrinologist for Graves' disease.  Her vitamin D level low.  She reported really level is fair but struggle with daily routine due to vision problem.  She uses cooked meal.  Her son lives close by and  that is very helpful.  She has a good support from her church and sometime her brother helps for doctor's appointment.  She did not reported any tremors, shakes or any EPS.  She denied any hallucination, paranoia or any suicidal thoughts.  She is happy as her Asa Lente is due next month any time.  She is not sure what will be planned for the vacation at this time.  She reported sometimes struggle with attention and focus but had cut down the Ritalin as does not feel that she needed every day.  Past Psychiatric History: H/O depression and ADD.  Tried Zoloft, Focalin, Ritalin and Strattera.  No history of inpatient or any suicidal attempt.    Outpatient Encounter Medications as of 03/26/2023  Medication Sig   cyclobenzaprine (FLEXERIL) 5 MG tablet Take by mouth as needed. (Patient not taking: Reported on 11/20/2022)   Dextran 70-Hypromellose, PF, (LUBRICANT EYE DROPS, PF,) 0.1-0.3 % SOLN Place 1-2 drops into both eyes 3 (three) times daily as needed (dry/irritated eyes.). (Patient not taking: Reported on 11/20/2022)   diclofenac (VOLTAREN) 75 MG EC tablet Take by mouth as needed. (Patient not taking: Reported on 03/05/2023)   Dimethyl Fumarate 240 MG CPDR Take 1 capsule (240 mg total) by mouth 2 (two) times daily.   dorzolamide-timolol (COSOPT) 2-0.5 % ophthalmic solution Administer 1 drop to both eyes two (2) times a day.   erythromycin ophthalmic ointment Place 1 inch strip to both eyes every evening.   FLUoxetine (PROZAC) 20 MG capsule  Take 3 capsules (60 mg total) by mouth daily.   levothyroxine (UNITHROID) 300 MCG tablet Take 1 tablet (300 mcg total) by mouth daily before breakfast.   Lifitegrast (XIIDRA) 5 % SOLN Place 1 drop into both eyes 2 (two) times daily approximately 12 hours apart (Patient not taking: Reported on 08/22/2022)   lisdexamfetamine (VYVANSE) 50 MG capsule Take 1 capsule (50 mg total) by mouth daily. (Patient not taking: Reported on 08/22/2022)   LORazepam (ATIVAN) 0.5 MG tablet Take  1 tablet (0.5 mg total) by mouth as needed for anxiety.   methylphenidate (RITALIN LA) 10 MG 24 hr capsule Take 1 capsule (10 mg total) by mouth daily.   neomycin-polymyxin b-dexamethasone (MAXITROL) 3.5-10000-0.1 SUSP Administer 1 drop to both eyes four (4) times a day.   oxyCODONE (OXY IR/ROXICODONE) 5 MG immediate release tablet Take 5 mg by mouth 4 (four) times daily as needed.   pantoprazole (PROTONIX) 40 MG tablet Take 1 tablet (40 mg total) by mouth daily.   predniSONE (DELTASONE) 20 MG tablet Take 60 mg by mouth daily. (Patient not taking: Reported on 03/20/2023)   timolol (TIMOPTIC) 0.5 % ophthalmic solution 1 drop 2 (two) times daily.   Vitamin D, Ergocalciferol, (DRISDOL) 1.25 MG (50000 UNIT) CAPS capsule Take 1 capsule (50,000 Units total) by mouth every 7 (seven) days.   [DISCONTINUED] atenolol (TENORMIN) 50 MG tablet Take 1 tablet (50 mg total) by mouth daily. (Patient not taking: No sig reported)   No facility-administered encounter medications on file as of 03/26/2023.    Recent Results (from the past 2160 hour(s))  CBC with Differential/Platelet     Status: Abnormal   Collection Time: 03/05/23 11:52 AM  Result Value Ref Range   WBC 15.7 (H) 3.4 - 10.8 x10E3/uL   RBC 4.38 3.77 - 5.28 x10E6/uL   Hemoglobin 12.5 11.1 - 15.9 g/dL   Hematocrit 78.2 95.6 - 46.6 %   MCV 91 79 - 97 fL   MCH 28.5 26.6 - 33.0 pg   MCHC 31.5 31.5 - 35.7 g/dL   RDW 21.3 08.6 - 57.8 %   Platelets 345 150 - 450 x10E3/uL   Neutrophils 80 Not Estab. %   Lymphs 12 Not Estab. %   Monocytes 4 Not Estab. %   Eos 2 Not Estab. %   Basos 1 Not Estab. %   Neutrophils Absolute 12.8 (H) 1.4 - 7.0 x10E3/uL   Lymphocytes Absolute 1.9 0.7 - 3.1 x10E3/uL   Monocytes Absolute 0.6 0.1 - 0.9 x10E3/uL   EOS (ABSOLUTE) 0.3 0.0 - 0.4 x10E3/uL   Basophils Absolute 0.1 0.0 - 0.2 x10E3/uL   Immature Granulocytes 1 Not Estab. %   Immature Grans (Abs) 0.1 0.0 - 0.1 x10E3/uL  Comprehensive metabolic panel     Status:  Abnormal   Collection Time: 03/05/23 11:52 AM  Result Value Ref Range   Glucose 106 (H) 70 - 99 mg/dL   BUN 17 8 - 27 mg/dL   Creatinine, Ser 4.69 0.57 - 1.00 mg/dL   eGFR 99 >62 XB/MWU/1.32   BUN/Creatinine Ratio 26 12 - 28   Sodium 140 134 - 144 mmol/L   Potassium 4.2 3.5 - 5.2 mmol/L   Chloride 102 96 - 106 mmol/L   CO2 21 20 - 29 mmol/L   Calcium 9.6 8.7 - 10.3 mg/dL   Total Protein 6.8 6.0 - 8.5 g/dL   Albumin 4.3 3.9 - 4.9 g/dL   Globulin, Total 2.5 1.5 - 4.5 g/dL   Bilirubin Total 0.3 0.0 -  1.2 mg/dL   Alkaline Phosphatase 105 44 - 121 IU/L   AST 23 0 - 40 IU/L   ALT 28 0 - 32 IU/L  VITAMIN D 25 Hydroxy (Vit-D Deficiency, Fractures)     Status: Abnormal   Collection Time: 03/05/23 11:52 AM  Result Value Ref Range   Vit D, 25-Hydroxy 8.0 (L) 30.0 - 100.0 ng/mL    Comment: Vitamin D deficiency has been defined by the Institute of Medicine and an Endocrine Society practice guideline as a level of serum 25-OH vitamin D less than 20 ng/mL (1,2). The Endocrine Society went on to further define vitamin D insufficiency as a level between 21 and 29 ng/mL (2). 1. IOM (Institute of Medicine). 2010. Dietary reference    intakes for calcium and D. [place]: The    [employer]. 2. Holick MF, Binkley Leighton, Bischoff-Ferrari HA, et al.    Evaluation, treatment, and prevention of vitamin D    deficiency: an Endocrine Society clinical practice    guideline. JCEM. 2011 Jul; 96(7):1911-30.   T4, free     Status: Abnormal   Collection Time: 03/14/23  9:57 AM  Result Value Ref Range   Free T4 2.71 (H) 0.60 - 1.60 ng/dL    Comment: Specimens from patients who are undergoing biotin therapy and /or ingesting biotin supplements may contain high levels of biotin.  The higher biotin concentration in these specimens interferes with this Free T4 assay.  Specimens that contain high levels  of biotin may cause false high results for this Free T4 assay.  Please interpret results in  light of the total clinical presentation of the patient.    TSH     Status: Abnormal   Collection Time: 03/14/23  9:57 AM  Result Value Ref Range   TSH 0.02 Repeated and verified X2. (L) 0.35 - 5.50 uIU/mL     Psychiatric Specialty Exam: Physical Exam  Review of Systems  Eyes:  Positive for visual disturbance.    Weight 196 lb (88.9 kg).There is no height or weight on file to calculate BMI.  General Appearance: NA  Eye Contact:  NA  Speech:  Normal Rate  Volume:  Normal  Mood:  Anxious  Affect:  NA  Thought Process:  Goal Directed  Orientation:  Full (Time, Place, and Person)  Thought Content:  Logical  Suicidal Thoughts:  No  Homicidal Thoughts:  No  Memory:  Immediate;   Good Recent;   Good Remote;   Good  Judgement:  Intact  Insight:  Present  Psychomotor Activity:  NA  Concentration:  Concentration: Good and Attention Span: Good  Recall:  Good  Fund of Knowledge:  Good  Language:  Good  Akathisia:  No  Handed:  Right  AIMS (if indicated):     Assets:  Communication Skills Desire for Improvement Housing Resilience Social Support  ADL's:  Intact  Cognition:  WNL  Sleep:  fair     Assessment/Plan: MDD (major depressive disorder), recurrent episode, mild (HCC) - Plan: LORazepam (ATIVAN) 0.5 MG tablet  Anxiety - Plan: LORazepam (ATIVAN) 0.5 MG tablet  Attention deficit hyperactivity disorder (ADHD), predominantly inattentive type  I reviewed blood work results.  She has low vitamin D.  Recommend to discontinue methylphenidate as patient does not need every day.  I also explained sometimes the stimulant can cause insomnia and anxiety and if she is not working then may need to stop taking Ritalin.  Patient agreed with the plan.  I encouraged to continue  Ativan to help her anxiety.  She is taking Prozac 60 mg prescribed from neurologist.  We have offered therapy in the past but patient refused.  Patient was given.  Provided brief psychotherapy related to adjustment  in her daily routine due to vision problem.  Recommend to call us back if she is any question or any concern.  Follow-up in 3 months.   Follow Up Instructions:     I discussed the assessment and treatment plan with the patient. The patient was provided an opportunity to ask questions and all were answered. The patient agreed with the plan and demonstrated an understanding of the instructions.   The patient was advised to call back or seek an in-person evaluation if the symptoms worsen or if the condition fails to improve as anticipated.    Collaboration of Care: Other provider involved in patient's care AEB notes are available in epic to review  Patient/Guardian was advised Release of Information must be obtained prior to any record release in order to collaborate their care with an outside provider. Patient/Guardian was advised if they have not already done so to contact the registration department to sign all necessary forms in order for [place] to release information regarding their care.   Consent: Patient/Guardian gives verbal consent for treatment and assignment of benefits for services provided during this visit. Patient/Guardian expressed understanding and agreed to proceed.     I provided 25 minutes of non face to face time during this encounter.  Note: This document was prepared by [employer] voice dictation technology and any errors that results from this process are unintentional.    Cleotis Nipper, MD 03/26/2023

## 2023-05-20 ENCOUNTER — Other Ambulatory Visit: Payer: Self-pay

## 2023-05-20 DIAGNOSIS — E890 Postprocedural hypothyroidism: Secondary | ICD-10-CM

## 2023-05-21 ENCOUNTER — Encounter: Payer: Self-pay | Admitting: Endocrinology

## 2023-05-21 ENCOUNTER — Other Ambulatory Visit: Payer: Self-pay | Admitting: Endocrinology

## 2023-05-21 ENCOUNTER — Telehealth: Payer: Self-pay

## 2023-05-21 DIAGNOSIS — E890 Postprocedural hypothyroidism: Secondary | ICD-10-CM

## 2023-05-21 LAB — T4, FREE: Free T4: 2.4 ng/dL — ABNORMAL HIGH (ref 0.8–1.8)

## 2023-05-21 LAB — TSH: TSH: 0.01 m[IU]/L — ABNORMAL LOW (ref 0.40–4.50)

## 2023-05-21 MED ORDER — LEVOTHYROXINE SODIUM 125 MCG PO TABS: 250.0000 ug | ORAL_TABLET | Freq: Every day | ORAL | 3 refills | Status: DC

## 2023-05-21 NOTE — Telephone Encounter (Signed)
-----   Message from [name] sent at 05/21/2023 12:26 PM EST ----- Please notify patient and arrange for lab visit, labs reviewed thyroid  hormone levels improving however still high.  She is currently taking levothyroxine  300 mcg daily.  Decrease levothyroxine  to 250 mcg daily.  Check thyroid  function test 2 to 3 days prior to follow-up visit with me in February.  I have placed order.

## 2023-05-21 NOTE — Telephone Encounter (Signed)
 Patient given results and medication changes as directed by MD.

## 2023-05-23 ENCOUNTER — Other Ambulatory Visit: Payer: Self-pay

## 2023-06-03 ENCOUNTER — Telehealth: Payer: Self-pay | Admitting: Gastroenterology

## 2023-06-03 NOTE — Telephone Encounter (Signed)
 Good afternoon Dr. San,   We received a call from this patient wishing to schedule an appointment with you to establish GI care. Patient was last seen by Dr. Rosalie in 2023 but has new insurance that they are not in network with. Patient states Dr. Rosalie recommended you. The records from the office visits are scanned into Media dated 6/05/202. Would you please review and advise on scheduling?  Thank you.

## 2023-06-05 ENCOUNTER — Encounter: Payer: Self-pay | Admitting: Gastroenterology

## 2023-06-05 NOTE — Telephone Encounter (Signed)
Left message for patient to call back and schedule office visit.

## 2023-06-05 NOTE — Telephone Encounter (Signed)
 Patient has been scheduled with [name] on 2/4 at 11:30

## 2023-06-24 NOTE — Progress Notes (Addendum)
 Chief Complaint: establish care Primary GI Doctor: Dr. San  HPI: Patient is a 63 year old female patient with past medical history of anxiety, basal cell skin cancer, GERD, Graves' disease, migraines, multiple sclerosis, hyper thyroidism, mitral valve prolapse,who was referred to me by Dr. Rosalie her former gastroenterologist, to establish care with new gastroenterologist.  Patient last seen by Dr. Rosalie in 06/02/2021.  At that time patient had a history of incontinence from her neurologic issues but no longer was having diarrhea. Used Levbid  in past. Patient also had a history of esophageal reflux that was controlled on pantoprazole  40 mg p.o. daily  Interval History     Patient has a history of GERD and currently taking pantoprazole  40 mg daily. Patient denies GERD or dysphagia. Patient denies nausea, vomiting, or weight loss. Patient also has history of stool/urine incontinence due to history of MS. She reports it is managed by her neurologist. She has trialed antispasmodics in the past without improvement. Patient denies abdominal pain or rectal bleeding. Reports she manages her symptoms on own. No blood thinners. Patient's family history includes brother with precancerous polyp.  Wt Readings from Last 3 Encounters:  06/25/23 187 lb 4 oz (84.9 kg)  03/20/23 196 lb (88.9 kg)  03/05/23 194 lb (88 kg)    Past Medical History:  Diagnosis Date   ADD (attention deficit disorder with hyperactivity)    Anxiety    Arthritis    Cancer (HCC)    Skin cancer- basal cell, squamous cell   Cervical dysplasia    Family history of adverse reaction to anesthesia    mother due to mitral valve regergitation   GERD (gastroesophageal reflux disease)    Graves disease    Headache    migraine   Hepatitis    Pt states Core and surface antibioties for Hepatitis whens he was 18   Hyperthyroidism    Mitral valve prolapse    Multiple sclerosis (HCC)    Multiple sclerosis (HCC)    Pneumonia     Tendonitis in rigtht wrist    Thyroid  eye disease     Past Surgical History:  Procedure Laterality Date   ABDOMINAL HYSTERECTOMY  2004   ovaries retained   BACK SURGERY     heart ablation     15 yrs ago for a fib by Dr.Klein, everything ok   KNEE ARTHROSCOPY WITH SUBCHONDROPLASTY Left 11/13/2017   Procedure: LEFT KNEE ARTHROSCOPY WITH SUBCHONDROPLASTY, PARTIAL MEDIAL MENISCECTOMY, SYNOVECTOMY;  Surgeon: Jerri Kay HERO, MD;  Location: [facility];  Service: Orthopedics;  Laterality: Left;   THYROIDECTOMY N/A 04/06/2021   Procedure: TOTAL THYROIDECTOMY;  Surgeon: Eletha Boas, MD;  Location: [facility];  Service: General;  Laterality: N/A;   TUBAL LIGATION      Current Outpatient Medications  Medication Sig Dispense Refill   diclofenac  (VOLTAREN ) 75 MG EC tablet Take by mouth as needed.     erythromycin  ophthalmic ointment Place 1 inch strip to both eyes every evening. 3.5 g 11   FLUoxetine  (PROZAC ) 20 MG capsule Take 3 capsules (60 mg total) by mouth daily. 270 capsule 3   levothyroxine  (UNITHROID ) 125 MCG tablet Take 2 tablets (250 mcg total) by mouth daily before breakfast. 180 tablet 3   LORazepam  (ATIVAN ) 0.5 MG tablet Take 1 tablet (0.5 mg total) by mouth as needed for anxiety. 15 tablet 1   pantoprazole  (PROTONIX ) 40 MG tablet Take 1 tablet (40 mg total) by mouth daily. 90 tablet 3   Dextran 70-Hypromellose,  PF, (LUBRICANT EYE DROPS, PF,) 0.1-0.3 % SOLN Place 1-2 drops into both eyes 3 (three) times daily as needed (dry/irritated eyes.). (Patient not taking: Reported on 11/20/2022)     Dimethyl Fumarate  240 MG CPDR Take 1 capsule (240 mg total) by mouth 2 (two) times daily. (Patient not taking: Reported on 06/25/2023) 180 capsule 3   dorzolamide -timolol  (COSOPT ) 2-0.5 % ophthalmic solution Administer 1 drop to both eyes two (2) times a day. (Patient not taking: Reported on 06/25/2023)     methylphenidate  (RITALIN  LA) 10 MG 24 hr capsule Take 1 capsule (10 mg total) by mouth daily.  (Patient not taking: Reported on 06/25/2023) 30 capsule 0   neomycin-polymyxin b-dexamethasone  (MAXITROL) 3.5-10000-0.1 SUSP Administer 1 drop to both eyes four (4) times a day. (Patient not taking: Reported on 06/25/2023)     predniSONE  (DELTASONE ) 20 MG tablet Take 60 mg by mouth daily. (Patient not taking: Reported on 06/25/2023)     timolol  (TIMOPTIC ) 0.5 % ophthalmic solution 1 drop 2 (two) times daily. (Patient not taking: Reported on 06/25/2023)     Vitamin D , Ergocalciferol , (DRISDOL ) 1.25 MG (50000 UNIT) CAPS capsule Take 1 capsule (50,000 Units total) by mouth every 7 (seven) days. 13 capsule 3   No current facility-administered medications for this visit.    Allergies as of 06/25/2023 - Review Complete 06/25/2023  Allergen Reaction Noted   Methylprednisolone  Hives 12/19/2016   Solu-medrol  [methylprednisolone  sodium succ] Hives and Itching 01/31/2017   Cephalexin Hives and Itching 09/10/2011   Ocrelizumab  Palpitations and Other (See Comments) 03/28/2021    Family History  Problem Relation Age of Onset   Depression Mother    Stroke Mother    Heart disease Mother    Dementia Mother    Hypothyroidism Mother    Stroke Father    Heart disease Father    Parkinson's disease Maternal Grandmother    Parkinson's disease Maternal Grandfather    Heart disease Paternal Grandmother    COPD Paternal Grandfather    Healthy Brother    Cancer Neg Hx    Diabetes Neg Hx    Breast cancer Neg Hx     Review of Systems:    Constitutional: No weight loss, fever, chills, weakness or fatigue HEENT: Eyes: No change in vision               Ears, Nose, Throat:  No change in hearing or congestion Skin: No rash or itching Cardiovascular: No chest pain, chest pressure or palpitations   Respiratory: No SOB or cough Gastrointestinal: See HPI and otherwise negative Genitourinary: No dysuria or change in urinary frequency Neurological: No headache, dizziness or syncope Musculoskeletal: No new muscle or  joint pain Hematologic: No bleeding or bruising Psychiatric: No history of depression or anxiety    Physical Exam:  Vital signs: BP 116/78 (BP Location: Right Arm, Patient Position: Sitting, Cuff Size: Normal)   Pulse 98   Ht 5' 7.5 (1.715 m)   Wt 187 lb 4 oz (84.9 kg)   SpO2 96%   BMI 28.89 kg/m   Constitutional:   Pleasant Caucasian female appears to be in NAD, Well developed, Well nourished, alert and cooperative Neck:  Supple Throat: Oral cavity and pharynx without inflammation, swelling or lesion.  Respiratory: Respirations even and unlabored. Lungs clear to auscultation bilaterally.   No wheezes, crackles, or rhonchi.  Cardiovascular: Normal S1, S2. Regular rate and rhythm. No peripheral edema, cyanosis or pallor.  Gastrointestinal:  Soft, nondistended, nontender. No rebound or guarding. Normal bowel sounds.  No appreciable masses or hepatomegaly. Rectal:  Not performed.  Msk:  Symmetrical without gross deformities. Without edema, no deformity or joint abnormality.  Neurologic:  Alert and  oriented x4;  grossly normal neurologically.  Skin:   Dry and intact without significant lesions or rashes. Psychiatric: Oriented to person, place and time. Demonstrates good judgement and reason without abnormal affect or behaviors.  RELEVANT LABS AND IMAGING: CBC    Latest Ref Rng & Units 03/05/2023   11:52 AM 08/22/2022   12:05 PM 03/15/2022    1:55 PM  CBC  WBC 3.4 - 10.8 x10E3/uL 15.7  10.2  9.8   Hemoglobin 11.1 - 15.9 g/dL 87.4  88.1  87.7   Hematocrit 34.0 - 46.6 % 39.7  36.3  37.7   Platelets 150 - 450 x10E3/uL 345  323  242      CMP     Latest Ref Rng & Units 03/05/2023   11:52 AM 08/22/2022   12:05 PM 03/15/2022    1:55 PM  CMP  Glucose 70 - 99 mg/dL 893  92    BUN 8 - 27 mg/dL 17  21    Creatinine 9.42 - 1.00 mg/dL 9.33  9.42    Sodium 865 - 144 mmol/L 140  140    Potassium 3.5 - 5.2 mmol/L 4.2  3.6    Chloride 96 - 106 mmol/L 102  100    CO2 20 - 29 mmol/L 21  25     Calcium  8.7 - 10.3 mg/dL 9.6  9.1    Total Protein 6.0 - 8.5 g/dL 6.8  6.2  6.5   Total Bilirubin 0.0 - 1.2 mg/dL 0.3  0.2  0.3   Alkaline Phos 44 - 121 IU/L 105  111  91   AST 0 - 40 IU/L 23  16  14    ALT 0 - 32 IU/L 28  17  11       Lab Results  Component Value Date   TSH 0.01 (L) 05/20/2023  04/08/20 echo-Left ventricular ejection fraction, by estimation, is 60 to 65% 06/28/2008 EGD-  ENDOSCOPIC DIAGNOSES:  1. Tiny hiatal hernia without signs of esophagitis.  2. Minimal antritis, status post biopsy.  3. Tiny proximal stomach polyp, status post biopsy and proximal      stomach biopsy to rule out Helicobacter.  4. Otherwise within normal limits to the third or fourth part of the      duodenum. 05/07/2012 colonoscopy with Dr.Magod Impression External and internal hemorrhoids 3 small polyps in the mid sigmoid colon, in the distal descending colon and in the distal transverse colon.  Resected and retrieved. The examination was otherwise normal. The examined portion of the ileum was normal Path: Diagnosis Surgical splenic flexure, descending polyp Tubular adenoma Hyperplastic polyp No high-grade dysplasia or malignancy identified  Assessment: Encounter Diagnoses  Name Primary?   Gastroesophageal reflux disease, unspecified whether esophagitis present Yes   Full incontinence of feces    Urinary incontinence, unspecified type    History of colonic polyps    63 year old female patient who presents here to establish care with gastroenterologist. She has history of GERD, controlled with Pantoprazole  40 mg po daily. No changes, refill medication. She has fecal/urine incontinence with her MS that is managed by her neurologist. She has trailed antispasmodics in past without improvement. She has learned to mange her symptoms. She also has history of colonic polyps, last colonoscopy in records was 2013, not sure if she had another one after that.  We will request records. We will go ahead  and scheduled colonoscopy in [facility].  Plan: -Continue pantoprazole  40 mg po daily, refill -Request records from Dr. Rosalie, colonoscopy in 2013, patient believes she Joesiah Lonon have had another? Per patient every 5 years so she would be due. -  Schedule a colonoscopy in [facility] with Dr. San. The risks and benefits of colonoscopy with possible polypectomy / biopsies were discussed and the patient agrees to proceed.    Thank you for the courtesy of this consult. Please call me with any questions or concerns.   Michiah Mudry, FNP-C [facility] 06/25/2023, 11:50 AM  Cc: No ref. provider found   Addendum: Received records from [facility] which are notable for the following: - 05/07/2012: Colonoscopy: Internal/external hemorrhoids, 3 small polyps resected from the sigmoid, descending, transverse colon with hot snare (path: Adenoma x 1, HP x 1), otherwise normal.  Normal TI.  Repeat 5 years - 05/07/2018: EGD: Tiny hiatal hernia, patchy minimal inflammation in the gastric antrum, normal duodenum - 05/07/2018: Colonoscopy: 3 small polyps in the rectum, sigmoid, hepatic flexure removed with hot snare, small transverse colon polyp, small descending colon polyp (path: Tubular adenomas x 2, hyperplastic polyps x 2).  Recommended repeat in 5 years - 06/02/2021: Follow-up appoint with Dr. Rosalie.  Continued pantoprazole  40 mg daily for reflux   Sandor San, DO, [facility]

## 2023-06-25 ENCOUNTER — Ambulatory Visit (INDEPENDENT_AMBULATORY_CARE_PROVIDER_SITE_OTHER): Payer: No Typology Code available for payment source | Admitting: Gastroenterology

## 2023-06-25 ENCOUNTER — Encounter: Payer: Self-pay | Admitting: Gastroenterology

## 2023-06-25 VITALS — BP 116/78 | HR 98 | Ht 67.5 in | Wt 187.2 lb

## 2023-06-25 DIAGNOSIS — K219 Gastro-esophageal reflux disease without esophagitis: Secondary | ICD-10-CM | POA: Diagnosis not present

## 2023-06-25 DIAGNOSIS — Z860100 Personal history of colon polyps, unspecified: Secondary | ICD-10-CM

## 2023-06-25 DIAGNOSIS — R32 Unspecified urinary incontinence: Secondary | ICD-10-CM

## 2023-06-25 DIAGNOSIS — R159 Full incontinence of feces: Secondary | ICD-10-CM

## 2023-06-25 MED ORDER — PANTOPRAZOLE SODIUM 40 MG PO TBEC: 40.0000 mg | DELAYED_RELEASE_TABLET | Freq: Every day | ORAL | 3 refills | Status: AC

## 2023-06-25 MED ORDER — SUFLAVE 178.7 G PO SOLR: 1.0000 | Freq: Once | ORAL | 0 refills | Status: AC

## 2023-06-25 NOTE — Patient Instructions (Addendum)
 We have sent the following medications to your pharmacy for you to pick up at your convenience: Pantoprazole   You have been scheduled for a colonoscopy. Please follow written instructions given to you at your visit today.   If you use inhalers (even only as needed), please bring them with you on the day of your procedure.  DO NOT TAKE 7 DAYS PRIOR TO TEST- Trulicity (dulaglutide) Ozempic, Wegovy (semaglutide) Mounjaro (tirzepatide) Bydureon Bcise (exanatide extended release)  DO NOT TAKE 1 DAY PRIOR TO YOUR TEST Rybelsus (semaglutide) Adlyxin (lixisenatide) Victoza (liraglutide) Byetta (exanatide) ___________________________________________________________________________  Rosine will receive your bowel preparation through [facility], which ensures the lowest copay and home delivery, with outreach via text or call from an [phone] number. Please respond promptly to avoid rescheduling of your procedure. If you are interested in alternative options or have any questions regarding your prep, please contact them at [phone] ____________________________________________________________________________  Your Provider Has Sent Your Bowel Prep Regimen To [facility]   [facility] will contact you to verify your information and collect your copay, if applicable. Enjoy the comfort of your home while your prescription is mailed to you, FREE of any shipping charges.   [facility] accepts all major insurance benefits and applies discounts & coupons.  Have additional questions?   Chat: [email or web address] Call: [phone] Email: [email or web address] NCPDP: [id number]  How will [facility] contact you?  With a Welcome phone call,  a Welcome text and a checkout link in text form.  Texts you receive from [phone] Are NOT Spam.  *To set up delivery, you must complete the checkout process via link or speak to one of the patient care representatives. If [facility] is unable to reach you,  your prescription may be delayed.  To avoid long hold times on the phone, you may also utilize the secure chat feature on the [facility] website to request that they call you back for transaction completion or to expedite your concerns.  _______________________________________________________  If your blood pressure at your visit was 140/90 or greater, please contact your primary care physician to follow up on this.  _______________________________________________________  If you are age 40 or older, your body mass index should be between 23-30. Your Body mass index is 28.89 kg/m. If this is out of the aforementioned range listed, please consider follow up with your Primary Care Provider.  If you are age 40 or younger, your body mass index should be between 19-25. Your Body mass index is 28.89 kg/m. If this is out of the aformentioned range listed, please consider follow up with your Primary Care Provider.   ________________________________________________________  The [facility] providers would like to encourage you to use MYCHART to communicate with providers for non-urgent requests or questions.  Due to long hold times on the telephone, sending your provider a message by [facility] may be a faster and more efficient way to get a response.  Please allow 48 business hours for a response.  Please remember that this is for non-urgent requests.  _______________________________________________________  Thank you for trusting me with your gastrointestinal care!   Deanna May, NP

## 2023-06-26 ENCOUNTER — Encounter (HOSPITAL_COMMUNITY): Payer: Self-pay | Admitting: Psychiatry

## 2023-06-26 ENCOUNTER — Telehealth (HOSPITAL_COMMUNITY): Payer: No Typology Code available for payment source | Admitting: Psychiatry

## 2023-06-26 VITALS — Wt 187.0 lb

## 2023-06-26 DIAGNOSIS — F330 Major depressive disorder, recurrent, mild: Secondary | ICD-10-CM

## 2023-06-26 DIAGNOSIS — F419 Anxiety disorder, unspecified: Secondary | ICD-10-CM

## 2023-06-26 MED ORDER — LORAZEPAM 0.5 MG PO TABS: 0.5000 mg | ORAL_TABLET | ORAL | 2 refills | Status: DC | PRN

## 2023-06-26 NOTE — Progress Notes (Signed)
 [name] MD Virtual Progress Note   Patient Location: Home Provider Location: Home Office  I connect with patient by telephone and verified that I am speaking with correct person by using two identifiers. I discussed the limitations of evaluation and management by telemedicine and the availability of in person appointments. I also discussed with the patient that there may be a patient responsible charge related to this service. The patient expressed understanding and agreed to proceed.  [name] [id number] 63 y.o.  06/26/2023 10:39 AM  History of Present Illness:  Patient is evaluated by phone session.  She had good holidays.  She was able to see the son who lives in [place] and another son who live close by.  Patient told continues to have issue with her vision which is declining slowly.  She has to use uber, getting rides from brother and son for the doctor's appointment.  Patient reported she is frustrated because now she has to do eye exam for her disability to continue.  However overall she feel handling situation better.  She denies any crying spells or any feeling of hopelessness or worthlessness.  She is taking Prozac  prescribed by neurologist which is keeping her depression is stable.  Occasionally she takes Ativan  when she feels very nervous or having any panic attacks.  Her appetite is okay.  Her weight is stable.  She does not go anywhere and does not do much because of the vision problem but able to do her ADLs.  She uses microwave to warm the food as she cannot cook.  She has no tremors shakes or any EPS.  She denies any hallucination or any paranoia.  She denies any suicidal thoughts.  She is no longer taking Ritalin  as does not feel she needed because she is not working.  Her attention concentration is okay and is does not interfere in her daily routines which is limited anyway.  I think  Past Psychiatric History: H/O depression and ADD.  Tried Zoloft,  Focalin, Ritalin  and Strattera.  No history of inpatient or any suicidal attempt.   Outpatient Encounter Medications as of 06/26/2023  Medication Sig   Dextran 70-Hypromellose, PF, (LUBRICANT EYE DROPS, PF,) 0.1-0.3 % SOLN Place 1-2 drops into both eyes 3 (three) times daily as needed (dry/irritated eyes.). (Patient not taking: Reported on 11/20/2022)   diclofenac  (VOLTAREN ) 75 MG EC tablet Take by mouth as needed.   Dimethyl Fumarate  240 MG CPDR Take 1 capsule (240 mg total) by mouth 2 (two) times daily. (Patient not taking: Reported on 06/25/2023)   dorzolamide -timolol  (COSOPT ) 2-0.5 % ophthalmic solution Administer 1 drop to both eyes two (2) times a day. (Patient not taking: Reported on 06/25/2023)   erythromycin  ophthalmic ointment Place 1 inch strip to both eyes every evening.   FLUoxetine  (PROZAC ) 20 MG capsule Take 3 capsules (60 mg total) by mouth daily.   levothyroxine  (UNITHROID ) 125 MCG tablet Take 2 tablets (250 mcg total) by mouth daily before breakfast.   LORazepam  (ATIVAN ) 0.5 MG tablet Take 1 tablet (0.5 mg total) by mouth as needed for anxiety.   methylphenidate  (RITALIN  LA) 10 MG 24 hr capsule Take 1 capsule (10 mg total) by mouth daily. (Patient not taking: Reported on 06/25/2023)   neomycin-polymyxin b-dexamethasone  (MAXITROL) 3.5-10000-0.1 SUSP Administer 1 drop to both eyes four (4) times a day. (Patient not taking: Reported on 06/25/2023)   pantoprazole  (PROTONIX ) 40 MG tablet Take 1 tablet (40 mg total) by mouth daily.   predniSONE  (DELTASONE )  20 MG tablet Take 60 mg by mouth daily. (Patient not taking: Reported on 06/25/2023)   timolol  (TIMOPTIC ) 0.5 % ophthalmic solution 1 drop 2 (two) times daily. (Patient not taking: Reported on 06/25/2023)   Vitamin D , Ergocalciferol , (DRISDOL ) 1.25 MG (50000 UNIT) CAPS capsule Take 1 capsule (50,000 Units total) by mouth every 7 (seven) days.   [DISCONTINUED] atenolol  (TENORMIN ) 50 MG tablet Take 1 tablet (50 mg total) by mouth daily. (Patient not  taking: No sig reported)   No facility-administered encounter medications on file as of 06/26/2023.    Recent Results (from the past 2160 hours)  T4, free     Status: Abnormal   Collection Time: 05/20/23  9:57 AM  Result Value Ref Range   Free T4 2.4 (H) 0.8 - 1.8 ng/dL  TSH     Status: Abnormal   Collection Time: 05/20/23  9:57 AM  Result Value Ref Range   TSH 0.01 (L) 0.40 - 4.50 mIU/L     Psychiatric Specialty Exam: Physical Exam  Review of Systems  Eyes:  Positive for visual disturbance.    Weight 187 lb (84.8 kg).There is no height or weight on file to calculate BMI.  General Appearance: NA  Eye Contact:  NA  Speech:  Normal Rate  Volume:  Normal  Mood:  Anxious  Affect:  NA  Thought Process:  Descriptions of Associations: Intact  Orientation:  Full (Time, Place, and Person)  Thought Content:  WDL  Suicidal Thoughts:  No  Homicidal Thoughts:  No  Memory:  Immediate;   Good Recent;   Good Remote;   Good  Judgement:  Intact  Insight:  Present  Psychomotor Activity:  NA  Concentration:  Concentration: Good and Attention Span: Good  Recall:  Good  Fund of Knowledge:  Good  Language:  Good  Akathisia:  No  Handed:  Right  AIMS (if indicated):     Assets:  Communication Skills Desire for Improvement Housing Resilience Social Support  ADL's:  Intact  Cognition:  WNL  Sleep:  fair     Assessment/Plan: MDD (major depressive disorder), recurrent episode, mild (HCC) - Plan: LORazepam  (ATIVAN ) 0.5 MG tablet  Anxiety - Plan: LORazepam  (ATIVAN ) 0.5 MG tablet  Patient is stable on current medication.  She is no longer taking thousand any diet.  She is taking Prozac  prescribed by neurologist which is helping her depression.  We have prescribed Ativan  0.5 mg 15 tablets which she take only as needed for severe anxiety or nervousness.  She denies drinking.  She is not interested in therapy since she feels symptoms are stable.  Continue Ativan  0.5 mg to take as needed for  severe anxiety.  Follow-up in 4 months.  Patient was given about upcoming eye exam for continued disability.   Follow Up Instructions:     I discussed the assessment and treatment plan with the patient. The patient was provided an opportunity to ask questions and all were answered. The patient agreed with the plan and demonstrated an understanding of the instructions.   The patient was advised to call back or seek an in-person evaluation if the symptoms worsen or if the condition fails to improve as anticipated.    Collaboration of Care: Other provider involved in patient's care AEB notes are available in epic to review  Patient/Guardian was advised Release of Information must be obtained prior to any record release in order to collaborate their care with an outside provider. Patient/Guardian was advised if they have not already  done so to contact the registration department to sign all necessary forms in order for us  to release information regarding their care.   Consent: Patient/Guardian gives verbal consent for treatment and assignment of benefits for services provided during this visit. Patient/Guardian expressed understanding and agreed to proceed.     I provided 18 minutes of non face to face time during this encounter.  Note: This document was prepared by [employer] voice dictation technology and any errors that results from this process are unintentional.    Leni ONEIDA Client, MD 06/26/2023

## 2023-07-12 ENCOUNTER — Other Ambulatory Visit: Payer: Self-pay

## 2023-07-16 ENCOUNTER — Other Ambulatory Visit: Payer: Self-pay

## 2023-07-19 ENCOUNTER — Ambulatory Visit: Payer: 59 | Admitting: Endocrinology

## 2023-07-25 ENCOUNTER — Other Ambulatory Visit: Payer: Self-pay

## 2023-07-26 ENCOUNTER — Encounter: Payer: Self-pay | Admitting: Endocrinology

## 2023-07-26 LAB — TSH: TSH: 0.01 m[IU]/L — ABNORMAL LOW (ref 0.40–4.50)

## 2023-07-26 LAB — T4, FREE: Free T4: 2.3 ng/dL — ABNORMAL HIGH (ref 0.8–1.8)

## 2023-07-29 NOTE — Progress Notes (Signed)
 Agree with the assessment and plan as outlined by [facility], FNP-C.  Carlitos Bottino, DO, [facility]

## 2023-07-30 ENCOUNTER — Encounter: Payer: Self-pay | Admitting: Endocrinology

## 2023-07-30 ENCOUNTER — Ambulatory Visit: Payer: 59 | Admitting: Endocrinology

## 2023-07-30 VITALS — BP 130/86 | HR 92 | Resp 20 | Ht 67.5 in | Wt 186.8 lb

## 2023-07-30 DIAGNOSIS — E890 Postprocedural hypothyroidism: Secondary | ICD-10-CM | POA: Diagnosis not present

## 2023-07-30 DIAGNOSIS — Z8639 Personal history of other endocrine, nutritional and metabolic disease: Secondary | ICD-10-CM | POA: Diagnosis not present

## 2023-07-30 DIAGNOSIS — E0500 Thyrotoxicosis with diffuse goiter without thyrotoxic crisis or storm: Secondary | ICD-10-CM

## 2023-07-30 MED ORDER — LEVOTHYROXINE SODIUM 200 MCG PO TABS
200.0000 ug | ORAL_TABLET | Freq: Every day | ORAL | 3 refills | Status: DC
Start: 2023-07-30 — End: 2023-10-31

## 2023-07-30 NOTE — Progress Notes (Signed)
 Outpatient Endocrinology Note Danielle [name], MD  07/30/23  Patient's Name: [name]    DOB: 1961-05-06    MRN: [id number]  REASON OF VISIT: Follow-up for postsurgical hypothyroidism  PCP: Pcp, No  HISTORY OF PRESENT ILLNESS:   [name] is a 63 y.o. old female with past medical history as listed below is presented for a follow up of postsurgical hypothyroidism.  Patient was last seen by Dr. Lucianne Muss in July 2024.  Pertinent Thyroid History: Patient was diagnosed with Graves' disease in October 2021, initially had typical symptoms of palpitation, dizziness, heat intolerance, and tremors and weight loss.  She had the symptoms after she had COVID booster shot in September 2021.  She was initially treated with methimazole 10 mg 2 times a day.  She had variable thyroid hormone levels fluctuating between hyper and hypothyroidism on methimazole.  Because of persistent hyperthyroidism along with Graves' ophthalmopathy, was recommended for thyroidectomy and underwent total thyroidectomy in April 06, 2021.  Pathology was benign consistent with multinodular hyperplasia, negative for malignancy.  Patient was started on levothyroxine after the surgery and require frequent adjustment of the dose.  Patient has required relatively high dose of levothyroxine.  She was on levothyroxine up to 250 mcg and then to 312 mcg daily.  In June 2024 she had TSH of 19, levothyroxine/Synthroid was sent to unithyroid 350 mcg daily in July 2024.  Levothyroxine dose has been gradually decreased from the visit in October 2024.  Tirosint and liquid thyroxine were not covered by insurance.  Graves' eye disease [name]' ophthalmopathy : Patient has been following at [facility].  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  Interval history  Patient has been taking levothyroxine/any thyroid 250 mcg daily.  Dose has been gradually  decreased from the last visit in October 2024.  She has been taking in the morning and reports compliance.  Patient denies palpitation or heat intolerance.  She denies taking biotin.  Recent lab still with elevated free T4 and low TSH.  She has been on current dose of levothyroxine since December 2024.   Latest Reference Range & Units 07/25/23 11:00  TSH 0.40 - 4.50 mIU/L 0.01 (L)  T4,Free(Direct) 0.8 - 1.8 ng/dL 2.3 (H)  (L): Data is abnormally low (H): Data is abnormally high  REVIEW OF SYSTEMS:  As per history of present illness.   PAST MEDICAL HISTORY: Past Medical History:  Diagnosis Date   ADD (attention deficit disorder with hyperactivity)    Anxiety    Arthritis    Cancer (HCC)    Skin cancer- basal cell, squamous cell   Cervical dysplasia    Family history of adverse reaction to anesthesia    mother due to mitral valve regergitation   GERD (gastroesophageal reflux disease)    Graves disease    Headache    migraine   Hepatitis    Pt states Core and surface antibioties for Hepatitis whens he was 18   Hyperthyroidism    Mitral valve prolapse    Multiple sclerosis (HCC)    Multiple sclerosis (HCC)    Pneumonia    Tendonitis in rigtht wrist    Thyroid eye disease     PAST SURGICAL HISTORY: Past Surgical History:  Procedure Laterality Date   ABDOMINAL HYSTERECTOMY  2004   ovaries retained   BACK SURGERY     heart ablation     15 yrs ago for a fib by Dr.Klein,  everything ok   KNEE ARTHROSCOPY WITH SUBCHONDROPLASTY Left 11/13/2017   Procedure: LEFT KNEE ARTHROSCOPY WITH SUBCHONDROPLASTY, PARTIAL MEDIAL MENISCECTOMY, SYNOVECTOMY;  Surgeon: Tarry Kos, MD;  Location: [facility];  Service: Orthopedics;  Laterality: Left;   THYROIDECTOMY N/A 04/06/2021   Procedure: TOTAL THYROIDECTOMY;  Surgeon: Darnell Level, MD;  Location: [facility];  Service: General;  Laterality: N/A;   TUBAL LIGATION      ALLERGIES: Allergies  Allergen Reactions    Methylprednisolone Hives   Solu-Medrol [Methylprednisolone Sodium Succ] Hives and Itching    Wheezing (was getting with Ocrevus as well)   Cephalexin Hives and Itching   Ocrelizumab Palpitations and Other (See Comments)    (Ocrevus) Wheezing.    FAMILY HISTORY:  Family History  Problem Relation Age of Onset   Depression Mother    Stroke Mother    Heart disease Mother    Dementia Mother    Hypothyroidism Mother    Stroke Father    Heart disease Father    Parkinson's disease Maternal Grandmother    Parkinson's disease Maternal Grandfather    Heart disease Paternal Grandmother    COPD Paternal Grandfather    Healthy Brother    Cancer Neg Hx    Diabetes Neg Hx    Breast cancer Neg Hx     SOCIAL HISTORY: Social History   Socioeconomic History   Marital status: Divorced    Spouse name: Not on file   Number of children: Not on file   Years of education: Not on file   Highest education level: Not on file  Occupational History   Not on file  Tobacco Use   Smoking status: Some Days    Current packs/day: 0.25    Types: Cigarettes   Smokeless tobacco: Never  Vaping Use   Vaping status: Never Used  Substance and Sexual Activity   Alcohol use: Yes    Alcohol/week: 0.0 standard drinks of alcohol    Comment: Occasioanl glass of wine    Drug use: No   Sexual activity: Not Currently    Birth control/protection: Surgical  Other Topics Concern   Not on file  Social History Narrative   Left Handed   2-3 Cups of Coffee per Day   Social Drivers of Health   Financial Resource Strain: Not on file  Food Insecurity: Not on file  Transportation Needs: Not on file  Physical Activity: Not on file  Stress: Not on file  Social Connections: Not on file    MEDICATIONS:  Current Outpatient Medications  Medication Sig Dispense Refill   Dextran 70-Hypromellose, PF, (LUBRICANT EYE DROPS, PF,) 0.1-0.3 % SOLN Place 1-2 drops into both eyes 3 (three) times daily as needed  (dry/irritated eyes.).     diclofenac (VOLTAREN) 75 MG EC tablet Take by mouth as needed.     Dimethyl Fumarate 240 MG CPDR Take 1 capsule (240 mg total) by mouth 2 (two) times daily. 180 capsule 3   erythromycin ophthalmic ointment Place 1 inch strip to both eyes every evening. 3.5 g 11   FLUoxetine (PROZAC) 20 MG capsule Take 3 capsules (60 mg total) by mouth daily. 270 capsule 3   LORazepam (ATIVAN) 0.5 MG tablet Take 1 tablet (0.5 mg total) by mouth as needed for anxiety. 15 tablet 2   neomycin-polymyxin b-dexamethasone (MAXITROL) 3.5-10000-0.1 SUSP      pantoprazole (PROTONIX) 40 MG tablet Take 1 tablet (40 mg total) by mouth daily. 90 tablet 3   Vitamin D, Ergocalciferol, (DRISDOL) 1.25  MG (50000 UNIT) CAPS capsule Take 1 capsule (50,000 Units total) by mouth every 7 (seven) days. 13 capsule 3   levothyroxine (UNITHROID) 200 MCG tablet Take 1 tablet (200 mcg total) by mouth daily before breakfast. 90 tablet 3   No current facility-administered medications for this visit.    PHYSICAL EXAM: Vitals:   07/30/23 1441  BP: 130/86  Pulse: 92  Resp: 20  SpO2: 95%  Weight: 186 lb 12.8 oz (84.7 kg)  Height: 5' 7.5" (1.715 m)   Body mass index is 28.83 kg/m.  Wt Readings from Last 3 Encounters:  07/30/23 186 lb 12.8 oz (84.7 kg)  06/25/23 187 lb 4 oz (84.9 kg)  03/20/23 196 lb (88.9 kg)     General: Well developed, well nourished female in no apparent distress.  HEENT: AT/[place], no external lesions. Hearing intact to the spoken word Eyes: Right eye postoperative changes, proptosis +, no redness or [place].  Neck: Trachea midline, neck supple, anterior neck scar + s/p thyroidectomy.  Lungs: Clear to auscultation, no wheeze. Respirations not labored Heart: S1S2, Regular in rate and rhythm.  Abdomen: Soft, non tender, non distended Neurologic: Alert, oriented, normal speech, deep tendon biceps reflexes normal,  no gross focal neurological deficit Extremities: No pedal pitting edema, no  tremors of outstretched hands Skin: Warm, color good.  Psychiatric: Does not appear depressed or anxious  PERTINENT HISTORIC LABORATORY AND IMAGING STUDIES:  All pertinent laboratory results were reviewed. Please see HPI also for further details.   TSH  Date Value Ref Range Status  07/25/2023 0.01 (L) 0.40 - 4.50 mIU/L Final  05/20/2023 0.01 (L) 0.40 - 4.50 mIU/L Final  03/14/2023 0.02 Repeated and verified X2. (L) 0.35 - 5.50 uIU/mL Final   T3, Total  Date Value Ref Range Status  02/03/2020 321 (H) 71 - 180 ng/dL Final     ASSESSMENT / PLAN  1. Postoperative hypothyroidism   2. H/O Graves' disease   3. Graves' ophthalmopathy   4. History of total thyroidectomy     -Patient has history of Graves' disease status post total thyroidectomy in November 2022.  Patient has postoperative hypothyroidism.  She has been requiring relatively high dose of thyroid hormone replacement.  She used to be on levothyroxine and changed to unithyroid from July 2024. -Currently taking unithyroid 250 mcg daily.  Recent thyroid lab consistent with over replacement of thyroid hormone.  Plan: -Decreased unithyroid to 200 mcg daily. -Check thyroid function test TSH, free T4 in 3 months.  Prior to follow-up visit.  # [name]' eye disease follow-up with ophthalmology at [facility].   Diagnoses and all orders for this visit:  Postoperative hypothyroidism -     levothyroxine (UNITHROID) 200 MCG tablet; Take 1 tablet (200 mcg total) by mouth daily before breakfast. -     T4, free -     TSH  H/O Graves' disease  Graves' ophthalmopathy  History of total thyroidectomy     DISPOSITION Follow up in clinic in 3 months suggested.  All questions answered and patient verbalized understanding of the plan.  Danielle Lochlan Grygiel, MD [facility] [address] [place], [address] [place], [place] [address] Phone # [phone]  At least part of this note was generated using  voice recognition software. Inadvertent word errors may have occurred, which were not recognized during the proofreading process.

## 2023-08-05 NOTE — Progress Notes (Signed)
 Agree with the assessment and plan as outlined by [facility], FNP-C.  Carlitos Bottino, DO, [facility]

## 2023-08-06 ENCOUNTER — Encounter: Payer: No Typology Code available for payment source | Admitting: Gastroenterology

## 2023-09-16 ENCOUNTER — Ambulatory Visit: Payer: 59 | Admitting: Neurology

## 2023-09-16 ENCOUNTER — Encounter: Payer: Self-pay | Admitting: Neurology

## 2023-09-16 VITALS — BP 149/86 | HR 77 | Ht 67.0 in | Wt 187.0 lb

## 2023-09-16 DIAGNOSIS — R5383 Other fatigue: Secondary | ICD-10-CM

## 2023-09-16 DIAGNOSIS — E559 Vitamin D deficiency, unspecified: Secondary | ICD-10-CM

## 2023-09-16 DIAGNOSIS — Z79899 Other long term (current) drug therapy: Secondary | ICD-10-CM | POA: Diagnosis not present

## 2023-09-16 DIAGNOSIS — G35 Multiple sclerosis: Secondary | ICD-10-CM

## 2023-09-16 DIAGNOSIS — E0500 Thyrotoxicosis with diffuse goiter without thyrotoxic crisis or storm: Secondary | ICD-10-CM

## 2023-09-16 DIAGNOSIS — H5789 Other specified disorders of eye and adnexa: Secondary | ICD-10-CM

## 2023-09-16 DIAGNOSIS — E079 Disorder of thyroid, unspecified: Secondary | ICD-10-CM

## 2023-09-16 MED ORDER — DIMETHYL FUMARATE 240 MG PO CPDR: 240.0000 mg | DELAYED_RELEASE_CAPSULE | Freq: Two times a day (BID) | ORAL | 3 refills | Status: AC

## 2023-09-16 NOTE — Progress Notes (Signed)
 [facility]  PATIENT: [name] DOB: 1961/03/08     HISTORICAL  CHIEF COMPLAINT:  Chief Complaint  Patient presents with   Follow-up    Pt in 11 alone Pt here MS f/u  Pt states hasn't taken MS medication in the last two months Pt states unemployed and unable to afford medication     HISTORY OF PRESENT ILLNESS:  [name] is a 63 y.o.woman with relapsing remitting multiple sclerosis.   Update 09/16/2023 She stopped Vumerity  and switched to DMF due to insurance issues.  Now her copay is very high ($125/month) and she stopped.   No exacerbations > 10 year.  However, MRI 03/02/2019 showed one new lesion.  MRI 2021 showed no new lesion  She was allergic to Ocrevus  and had multiple skin cancers (basal and squamous) while on Gilenya  (still has had some on other medications).   She has no recent skin lesions.     She was never on Lemtrada.   Symptoms started after he Covid booster.  Gait is mildly of balanced she feels due to reduced depth perception more than MS.  No falls.  No weakness.  She has s sensation like socks rolled up under her toes/distal foot.    She has urinary incontinence that is stable but not controlled..   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    Not on an medication for bladder or bowel now.   She has extreme urgency with both and can't always get to a facility in time.   She has nocturia 3-4 times most nights.  She wears pads.    Desmopressin  did not help and she stopped.  She ihas some mental fog.   Vyvanse  helps ADD.   Due to naitonal shortage she has not been on any.   She is working part-time 16-20 hours only.  She has some fatigue.  She is sleeping better but has nocturia 2-3 rimes nightly..  She has depression but feels fluoxetine  has been helpful.  She is frustrated as can't drive, read or do many tasks due to poor vision.  She has Grave's  disease (has Thyrotropin receptor Abs).  Her right eye vision is progressively worsenig.   She has diplopia.  She is on methimazole .  She also did Tepeza (had 8 infusion over 2 years) with benefit - initially less diplopia.   12/2022 she had orbital decompression bilaterally due to concern about her optic nerves.    She has had thyroidectomy. Vision is still impaired.     She sees ophthalmology (Dr. Joseph Nickel and also Hwong at [facility])  and endocrinology regularly (Now Dr. Jen Minks; was Dr. Hubert Madden)  MS history: She was diagnosed with relapsing remitting MS around 2000 after presenting with numbness in the hands and feet.  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 She was admitted to the hospital. She then was diagnosed with MS and is to see Dr. Sulema Endo at [facility] He started her on Betaseron.   She did fairly well on Betaseron. She did have several small exacerbations and received some steroids a couple times. Often the exacerbations would be associated with headache as well. About 4 years ago, she opted to switch to Gilenya  to have oral therapy for the MS. She has done very well on that medication without any exacerbations.  Her June 2015 MRI did show one focus  that was not present on her MRI from March 2010.  Due to several skin cancers, she switched to Copaxone  in 2018 but had breakthrough activity.  She switched to Ocrevus  in 2019 but had difficulty tolerating the medication.  She switched to Vumerity  in early 2020.  Switched to DMF in 2023 due to insurance  Recent imaging: MRI brain 03/02/2019 showed a "new lesion involving the right superior temporal gyrus consistent with progression of disease.    Increased prominence of lesions in the subcortical white matter of the cingulate gyrus bilaterally, right greater than left. This could impact symptoms in the feet or lower extremities and is also consistent with progression of disease.  Progressive signal change in the  left sylvian fissure. No enhancement or restricted diffusion to suggest active demyelination."  MRI of the cervical spine 02/24/2004 showed a normal spinal cord and a disc protrusion to the right at C5-C6 causing right C6 nerve root encroachment.  MRI of the thoracic spine 08/16/2005 showed disc protrusion at T9-T10 and milder protrusions elsewhere but no evidence of demyelination  ALLERGIES: Allergies  Allergen Reactions   Methylprednisolone  Hives   Solu-Medrol  [Methylprednisolone  Sodium Succ] Hives and Itching    Wheezing (was getting with Ocrevus  as well)   Cephalexin Hives and Itching   Ocrelizumab  Palpitations and Other (See Comments)    (Ocrevus ) Wheezing.    HOME MEDICATIONS:  Current Outpatient Medications:    diclofenac  (VOLTAREN ) 75 MG EC tablet, Take by mouth as needed., Disp: , Rfl:    FLUoxetine  (PROZAC ) 20 MG capsule, Take 3 capsules (60 mg total) by mouth daily., Disp: 270 capsule, Rfl: 3   levothyroxine  (UNITHROID ) 200 MCG tablet, Take 1 tablet (200 mcg total) by mouth daily before breakfast., Disp: 90 tablet, Rfl: 3   LORazepam  (ATIVAN ) 0.5 MG tablet, Take 1 tablet (0.5 mg total) by mouth as needed for anxiety., Disp: 15 tablet, Rfl: 2   pantoprazole  (PROTONIX ) 40 MG tablet, Take 1 tablet (40 mg total) by mouth daily., Disp: 90 tablet, Rfl: 3   Vitamin D , Ergocalciferol , (DRISDOL ) 1.25 MG (50000 UNIT) CAPS capsule, Take 1 capsule (50,000 Units total) by mouth every 7 (seven) days., Disp: 13 capsule, Rfl: 3   Dextran 70-Hypromellose, PF, (LUBRICANT EYE DROPS, PF,) 0.1-0.3 % SOLN, Place 1-2 drops into both eyes 3 (three) times daily as needed (dry/irritated eyes.)., Disp: , Rfl:    Dimethyl Fumarate  240 MG CPDR, Take 1 capsule (240 mg total) by mouth 2 (two) times daily., Disp: 180 capsule, Rfl: 3   erythromycin  ophthalmic ointment, Place 1 inch strip to both eyes every evening. (Patient not taking: Reported on 09/16/2023), Disp: 3.5 g, Rfl: 11   neomycin-polymyxin  b-dexamethasone  (MAXITROL) 3.5-10000-0.1 SUSP, , Disp: , Rfl:   PAST MEDICAL HISTORY: Past Medical History:  Diagnosis Date   ADD (attention deficit disorder with hyperactivity)    Anxiety    Arthritis    Cancer (HCC)    Skin cancer- basal cell, squamous cell   Cervical dysplasia    Family history of adverse reaction to anesthesia    mother due to mitral valve regergitation   GERD (gastroesophageal reflux disease)    Graves disease    Headache    migraine   Hepatitis    Pt states Core and surface antibioties for Hepatitis whens he was 18   Hyperthyroidism    Mitral valve prolapse    Multiple sclerosis (HCC)    Multiple sclerosis (HCC)    Pneumonia    Tendonitis in rigtht wrist  Thyroid  eye disease     PAST SURGICAL HISTORY: Past Surgical History:  Procedure Laterality Date   ABDOMINAL HYSTERECTOMY  2004   ovaries retained   BACK SURGERY     heart ablation     15 yrs ago for a fib by Dr.Klein, everything ok   KNEE ARTHROSCOPY WITH SUBCHONDROPLASTY Left 11/13/2017   Procedure: LEFT KNEE ARTHROSCOPY WITH SUBCHONDROPLASTY, PARTIAL MEDIAL MENISCECTOMY, SYNOVECTOMY;  Surgeon: Wes Hamman, MD;  Location: [facility];  Service: Orthopedics;  Laterality: Left;   THYROIDECTOMY N/A 04/06/2021   Procedure: TOTAL THYROIDECTOMY;  Surgeon: Oralee Billow, MD;  Location: [facility];  Service: General;  Laterality: N/A;   TUBAL LIGATION      FAMILY HISTORY: Family History  Problem Relation Age of Onset   Depression Mother    Stroke Mother    Heart disease Mother    Dementia Mother    Hypothyroidism Mother    Stroke Father    Heart disease Father    Healthy Brother    Parkinson's disease Maternal Grandmother    Parkinson's disease Maternal Grandfather    Heart disease Paternal Grandmother    COPD Paternal Grandfather    Cancer Neg Hx    Diabetes Neg Hx    Breast cancer Neg Hx    Multiple sclerosis Neg Hx     SOCIAL HISTORY:  Social History   Socioeconomic  History   Marital status: Divorced    Spouse name: Not on file   Number of children: Not on file   Years of education: Not on file   Highest education level: Not on file  Occupational History   Not on file  Tobacco Use   Smoking status: Every Day    Current packs/day: 0.25    Types: Cigarettes   Smokeless tobacco: Never  Vaping Use   Vaping status: Never Used  Substance and Sexual Activity   Alcohol use: Yes    Alcohol/week: 0.0 standard drinks of alcohol    Comment: Occasioanl glass of wine    Drug use: No   Sexual activity: Not Currently    Birth control/protection: Surgical  Other Topics Concern   Not on file  Social History Narrative   Left Handed   2-3 Cups of Coffee per Day   Social Drivers of Health   Financial Resource Strain: Not on file  Food Insecurity: Not on file  Transportation Needs: Not on file  Physical Activity: Not on file  Stress: Not on file  Social Connections: Not on file  Intimate Partner Violence: Not on file     PHYSICAL EXAM   BP (!) 149/86   Pulse 77   Ht 5\' 7"  (1.702 m)   Wt 187 lb (84.8 kg)   BMI 29.29 kg/m    General: The patient is well-developed and well-nourished and in no acute distress   Neurologic Exam  Mental status: The patient is alert and oriented x 3 at the time of the examination. The patient has apparent normal recent and remote memory, with an apparently normal attention span and concentration ability.   Speech is normal.  Cranial nerves: There is mild exophthalmos.  Mild disconjugate gaze looking to the right.  Color vision was symmetric.  Facial strength is normal.  Trapezius strength is normal.  Hearing is normal.  Motor:  Muscle bulk is normal.  Muscle tone is increased in the legs.  She has reduced strength in the right leg relative to the left  Sensory: Sensory testing is  intact to touch and vibration .    Coordination: Cerebellar testing reveals good finger-nose-finger and Heel-to-shin   Gait and  station: Station is normal.  Mild left foot drop.  Tandem gait is wide..  Romberg is negative.  Reflexes: Deep tendon reflexes are symmetric and normal bilaterally.     DIAGNOSTIC DATA (LABS, IMAGING, TESTING) - I reviewed patient records, labs, notes, testing and imaging myself where available.  Lab Results  Component Value Date   WBC 15.7 (H) 03/05/2023   HGB 12.5 03/05/2023   HCT 39.7 03/05/2023   MCV 91 03/05/2023   PLT 345 03/05/2023      Component Value Date/Time   NA 140 03/05/2023 1152   K 4.2 03/05/2023 1152   CL 102 03/05/2023 1152   CO2 21 03/05/2023 1152   GLUCOSE 106 (H) 03/05/2023 1152   GLUCOSE 119 (H) 04/07/2021 0401   BUN 17 03/05/2023 1152   CREATININE 0.66 03/05/2023 1152   CALCIUM  9.6 03/05/2023 1152   PROT 6.8 03/05/2023 1152   ALBUMIN 4.3 03/05/2023 1152   AST 23 03/05/2023 1152   ALT 28 03/05/2023 1152   ALKPHOS 105 03/05/2023 1152   BILITOT 0.3 03/05/2023 1152   GFRNONAA >60 04/07/2021 0401   GFRAA 136 02/03/2020 0921       ASSESSMENT AND PLAN  High risk medication use  MS (multiple sclerosis) (HCC) - Plan: Dimethyl Fumarate  240 MG CPDR  Vitamin D  deficiency  Graves disease  Thyroid  eye disease  Other fatigue   1.   Restart DMF through [email or web address].  At next visit, the CBC/Diff and CMP.   2.   Continue to f/u with urology for incontinence.   3.   She sees endo and ophtho for her thyroid  and eye disease.     4.   She will return to see us  in 6 months or sooner if any new or worsening neurologic symptoms.   Robin Pafford A. Godwin Lat, MD, PhD, Terral Ferrari 09/16/2023, 11:35 AM Certified in Neurology, Clinical Neurophysiology, Sleep Medicine, Pain Medicine and Neuroimaging  [facility] [address], [address] [place], [place] [address] [phone]

## 2023-09-17 ENCOUNTER — Encounter: Admitting: Gastroenterology

## 2023-09-20 ENCOUNTER — Ambulatory Visit (AMBULATORY_SURGERY_CENTER): Admitting: Gastroenterology

## 2023-09-20 ENCOUNTER — Encounter: Payer: Self-pay | Admitting: Gastroenterology

## 2023-09-20 VITALS — BP 141/84 | HR 76 | Temp 98.4°F | Resp 19 | Ht 67.5 in | Wt 187.0 lb

## 2023-09-20 DIAGNOSIS — D124 Benign neoplasm of descending colon: Secondary | ICD-10-CM | POA: Diagnosis not present

## 2023-09-20 DIAGNOSIS — D125 Benign neoplasm of sigmoid colon: Secondary | ICD-10-CM

## 2023-09-20 DIAGNOSIS — Z1211 Encounter for screening for malignant neoplasm of colon: Secondary | ICD-10-CM

## 2023-09-20 DIAGNOSIS — Z860100 Personal history of colon polyps, unspecified: Secondary | ICD-10-CM

## 2023-09-20 DIAGNOSIS — K635 Polyp of colon: Secondary | ICD-10-CM

## 2023-09-20 MED ORDER — SODIUM CHLORIDE 0.9 % IV SOLN: 500.0000 mL | Freq: Once | INTRAVENOUS | Status: DC

## 2023-09-20 NOTE — Progress Notes (Signed)
 To pacu, VSS. Report to [email or web address]

## 2023-09-20 NOTE — Progress Notes (Signed)
 Called to room to assist during endoscopic procedure.  Patient ID and intended procedure confirmed with present staff. Received instructions for my participation in the procedure from the performing physician.

## 2023-09-20 NOTE — Op Note (Signed)
 [facility] Patient Name: [name] Procedure Date: 09/20/2023 3:00 PM MRN: [id number] Endoscopist: Harry Lindau , MD, [id number] Age: 63 Referring MD:  Date of Birth: 06/07/1960 Gender: Female Account #: [id number] Procedure:                Colonoscopy Indications:              Surveillance: Personal history of adenomatous                            polyps on last colonoscopy 5 years ago                           Last colonoscopy was 04/2018 and notable for 3                            small polyps with path showing tubular adenomas x 2                            and hyperplastic polyps, with recommendation repeat                            in 5 years. Family history notable for brother with                            colon polyps, but no known family history of colon                            cancer. Medicines:                Monitored Anesthesia Care Procedure:                Pre-Anesthesia Assessment:                           - Prior to the procedure, a History and Physical                            was performed, and patient medications and                            allergies were reviewed. The patient's tolerance of                            previous anesthesia was also reviewed. The risks                            and benefits of the procedure and the sedation                            options and risks were discussed with the patient.                            All questions were answered, and informed consent  was obtained. Prior Anticoagulants: The patient has                            taken no anticoagulant or antiplatelet agents. ASA                            Grade Assessment: II - A patient with mild systemic                            disease. After reviewing the risks and benefits,                            the patient was deemed in satisfactory condition to                            undergo the procedure.                            After obtaining informed consent, the colonoscope                            was passed under direct vision. Throughout the                            procedure, the patient's blood pressure, pulse, and                            oxygen saturations were monitored continuously. The                            Olympus CF-HQ190L ([id number]) Colonoscope was                            introduced through the anus and advanced to the the                            cecum, identified by appendiceal orifice and                            ileocecal valve. The colonoscopy was performed                            without difficulty. The patient tolerated the                            procedure well. The quality of the bowel                            preparation was good. The ileocecal valve,                            appendiceal orifice, and rectum were photographed. Scope In: 3:22:07 PM Scope Out: 3:35:05 PM Scope Withdrawal Time: 0 hours 9 minutes 16 seconds  Total Procedure Duration: 0 hours  12 minutes 58 seconds  Findings:                 The perianal and digital rectal examinations were                            normal.                           Two sessile polyps were found in the sigmoid colon                            and descending colon. The polyps were 2 to 4 mm in                            size. These polyps were removed with a cold snare.                            Resection and retrieval were complete. Estimated                            blood loss was minimal.                           The exam was otherwise normal throughout the                            remainder of the colon.                           The retroflexed view of the distal rectum and anal                            verge was normal and showed no anal or rectal                            abnormalities. Complications:            No immediate complications. Estimated Blood Loss:     Estimated blood loss  was minimal. Impression:               - Two 2 to 4 mm polyps in the sigmoid colon and in                            the descending colon, removed with a cold snare.                            Resected and retrieved.                           - The distal rectum and anal verge are normal on                            retroflexion view. Recommendation:           - Patient has a contact number available for  emergencies. The signs and symptoms of potential                            delayed complications were discussed with the                            patient. Return to normal activities tomorrow.                            Written discharge instructions were provided to the                            patient.                           - Resume previous diet.                           - Continue present medications.                           - Await pathology results.                           - Repeat colonoscopy for surveillance based on                            pathology results.                           - Return to GI office PRN. Harry Lindau, MD 09/20/2023 3:41:01 PM

## 2023-09-20 NOTE — Progress Notes (Signed)
 Pt's states no medical or surgical changes since previsit or office visit.

## 2023-09-20 NOTE — Patient Instructions (Signed)
 Resume previous diet Continue present medications Await pathology results Repeat colonoscopy for surveillance based on pathology results Return to GI office as needed YOU HAD AN ENDOSCOPIC PROCEDURE TODAY AT THE [facility]:   Refer to the procedure report that was given to you for any specific questions about what was found during the examination.  If the procedure report does not answer your questions, please call your gastroenterologist to clarify.  If you requested that your care partner not be given the details of your procedure findings, then the procedure report has been included in a sealed envelope for you to review at your convenience later.  YOU SHOULD EXPECT: Some feelings of bloating in the abdomen. Passage of more gas than usual.  Walking can help get rid of the air that was put into your GI tract during the procedure and reduce the bloating. If you had a lower endoscopy (such as a colonoscopy or flexible sigmoidoscopy) you may notice spotting of blood in your stool or on the toilet paper. If you underwent a bowel prep for your procedure, you may not have a normal bowel movement for a few days.  Please Note:  You might notice some irritation and congestion in your nose or some drainage.  This is from the oxygen used during your procedure.  There is no need for concern and it should clear up in a day or so.  SYMPTOMS TO REPORT IMMEDIATELY:  Following lower endoscopy (colonoscopy or flexible sigmoidoscopy):  Excessive amounts of blood in the stool  Significant tenderness or worsening of abdominal pains  Swelling of the abdomen that is new, acute  Fever of 100F or higher For urgent or emergent issues, a gastroenterologist can be reached at any hour by calling [phone]. Do not use MyChart messaging for urgent concerns.   DIET:  We do recommend a small meal at first, but then you may proceed to your regular diet.  Drink plenty of fluids but you should avoid alcoholic  beverages for 24 hours.  ACTIVITY:  You should plan to take it easy for the rest of today and you should NOT DRIVE or use heavy machinery until tomorrow (because of the sedation medicines used during the test).    FOLLOW UP: Our staff will call the number listed on your records the next business day following your procedure.  We will call around 7:15- 8:00 am to check on you and address any questions or concerns that you may have regarding the information given to you following your procedure. If we do not reach you, we will leave a message.     If any biopsies were taken you will be contacted by phone or by letter within the next 1-3 weeks.  Please call us  at [phone] if you have not heard about the biopsies in 3 weeks.   SIGNATURES/CONFIDENTIALITY: You and/or your care partner have signed paperwork which will be entered into your electronic medical record.  These signatures attest to the fact that that the information above on your After Visit Summary has been reviewed and is understood.  Full responsibility of the confidentiality of this discharge information lies with you and/or your care-partner.

## 2023-09-20 NOTE — Progress Notes (Signed)
 GASTROENTEROLOGY PROCEDURE H&P NOTE   Primary Care Physician: Pcp, No    Reason for Procedure:  Colon polyp surveillance  Plan:    Colonoscopy  Patient is appropriate for endoscopic procedure(s) in the ambulatory ([facility]) setting.  The nature of the procedure, as well as the risks, benefits, and alternatives were carefully and thoroughly reviewed with the patient. Ample time for discussion and questions allowed. The patient understood, was satisfied, and agreed to proceed.     HPI: [name] is a 63 y.o. female who presents for colonoscopy for ongoing colon polyp surveillance and colon cancer screening.  No active GI symptoms.  No known family history of colon cancer or related malignancy.  Patient is otherwise without complaints or active issues today.  Last colonoscopy was 04/2018 and notable for 3 small polyps with path showing tubular adenomas x 2 and hyperplastic polyps, with recommendation repeat in 5 years.   Family history notable for brother with colon polyps, but no known family history of colon cancer.  Endoscopic History: - 05/07/2012: Colonoscopy: Internal/external hemorrhoids, 3 small polyps resected from the sigmoid, descending, transverse colon with hot snare (path: Adenoma x 1, HP x 1), otherwise normal.  Normal TI.  Repeat 5 years - 05/07/2018: EGD: Tiny hiatal hernia, patchy minimal inflammation in the gastric antrum, normal duodenum - 05/07/2018: Colonoscopy: 3 small polyps in the rectum, sigmoid, hepatic flexure removed with hot snare, small transverse colon polyp, small descending colon polyp (path: Tubular adenomas x 2, hyperplastic polyps x 2).  Recommended repeat in 5 years - 06/02/2021: Follow-up appoint with Dr. Lavaughn Portland.  Continued pantoprazole  40 mg daily for reflux  Past Medical History:  Diagnosis Date   ADD (attention deficit disorder with hyperactivity)    Anxiety    Arthritis    Cancer (HCC)    Skin cancer- basal cell, squamous cell   Cervical  dysplasia    Family history of adverse reaction to anesthesia    mother due to mitral valve regergitation   GERD (gastroesophageal reflux disease)    Graves disease    Headache    migraine   Hepatitis    Pt states Core and surface antibioties for Hepatitis whens he was 18   Hyperthyroidism    Mitral valve prolapse    Multiple sclerosis (HCC)    Multiple sclerosis (HCC)    Pneumonia    Tendonitis in rigtht wrist    Thyroid  eye disease     Past Surgical History:  Procedure Laterality Date   ABDOMINAL HYSTERECTOMY  2004   ovaries retained   BACK SURGERY     heart ablation     15 yrs ago for a fib by Dr.Klein, everything ok   KNEE ARTHROSCOPY WITH SUBCHONDROPLASTY Left 11/13/2017   Procedure: LEFT KNEE ARTHROSCOPY WITH SUBCHONDROPLASTY, PARTIAL MEDIAL MENISCECTOMY, SYNOVECTOMY;  Surgeon: Wes Hamman, MD;  Location: [facility];  Service: Orthopedics;  Laterality: Left;   THYROIDECTOMY N/A 04/06/2021   Procedure: TOTAL THYROIDECTOMY;  Surgeon: Oralee Billow, MD;  Location: [facility];  Service: General;  Laterality: N/A;   TUBAL LIGATION      Prior to Admission medications   Medication Sig Start Date End Date Taking? Authorizing Provider  FLUoxetine  (PROZAC ) 20 MG capsule Take 3 capsules (60 mg total) by mouth daily. 10/18/22  Yes Sater, Sherida Dimmer, MD  levothyroxine  (UNITHROID ) 200 MCG tablet Take 1 tablet (200 mcg total) by mouth daily before breakfast. 07/30/23  Yes Thapa, [place], MD  pantoprazole  (PROTONIX ) 40 MG tablet  Take 1 tablet (40 mg total) by mouth daily. 06/25/23  Yes May, Deanna J, NP  diclofenac  (VOLTAREN ) 75 MG EC tablet Take by mouth as needed.    [provider]  Dimethyl Fumarate  240 MG CPDR Take 1 capsule (240 mg total) by mouth 2 (two) times daily. 09/16/23   Sater, Sherida Dimmer, MD  erythromycin  ophthalmic ointment Place 1 inch strip to both eyes every evening. Patient not taking: Reported on 09/20/2023 03/14/22     LORazepam  (ATIVAN ) 0.5 MG tablet  Take 1 tablet (0.5 mg total) by mouth as needed for anxiety. 06/26/23 06/25/24  Arfeen, Syed T, MD  neomycin-polymyxin b-dexamethasone  (MAXITROL) 3.5-10000-0.1 SUSP  01/07/23   [provider]  Vitamin D , Ergocalciferol , (DRISDOL ) 1.25 MG (50000 UNIT) CAPS capsule Take 1 capsule (50,000 Units total) by mouth every 7 (seven) days. 03/06/23   Sater, Sherida Dimmer, MD  atenolol  (TENORMIN ) 50 MG tablet Take 1 tablet (50 mg total) by mouth daily. Patient not taking: No sig reported 02/23/20 04/05/20  Lajean Pike, MD    Current Outpatient Medications  Medication Sig Dispense Refill   FLUoxetine  (PROZAC ) 20 MG capsule Take 3 capsules (60 mg total) by mouth daily. 270 capsule 3   levothyroxine  (UNITHROID ) 200 MCG tablet Take 1 tablet (200 mcg total) by mouth daily before breakfast. 90 tablet 3   pantoprazole  (PROTONIX ) 40 MG tablet Take 1 tablet (40 mg total) by mouth daily. 90 tablet 3   diclofenac  (VOLTAREN ) 75 MG EC tablet Take by mouth as needed.     Dimethyl Fumarate  240 MG CPDR Take 1 capsule (240 mg total) by mouth 2 (two) times daily. 180 capsule 3   erythromycin  ophthalmic ointment Place 1 inch strip to both eyes every evening. (Patient not taking: Reported on 09/20/2023) 3.5 g 11   LORazepam  (ATIVAN ) 0.5 MG tablet Take 1 tablet (0.5 mg total) by mouth as needed for anxiety. 15 tablet 2   neomycin-polymyxin b-dexamethasone  (MAXITROL) 3.5-10000-0.1 SUSP  (Patient not taking: Reported on 09/20/2023)     Vitamin D , Ergocalciferol , (DRISDOL ) 1.25 MG (50000 UNIT) CAPS capsule Take 1 capsule (50,000 Units total) by mouth every 7 (seven) days. 13 capsule 3   Current Facility-Administered Medications  Medication Dose Route Frequency Provider Last Rate Last Admin   0.9 %  sodium chloride  infusion  500 mL Intravenous Once Dnya Hickle V, DO        Allergies as of 09/20/2023 - Review Complete 09/20/2023  Allergen Reaction Noted   Methylprednisolone  Hives 12/19/2016   Solu-medrol  [methylprednisolone   sodium succ] Hives and Itching 01/31/2017   Cephalexin Hives and Itching 09/10/2011   Ocrelizumab  Palpitations and Other (See Comments) 03/28/2021    Family History  Problem Relation Age of Onset   Depression Mother    Stroke Mother    Heart disease Mother    Dementia Mother    Hypothyroidism Mother    Stroke Father    Heart disease Father    Healthy Brother    Parkinson's disease Maternal Grandmother    Parkinson's disease Maternal Grandfather    Heart disease Paternal Grandmother    COPD Paternal Grandfather    Cancer Neg Hx    Diabetes Neg Hx    Breast cancer Neg Hx    Multiple sclerosis Neg Hx     Social History   Socioeconomic History   Marital status: Divorced    Spouse name: Not on file   Number of children: Not on file   Years of education: Not on file  Highest education level: Not on file  Occupational History   Not on file  Tobacco Use   Smoking status: Every Day    Current packs/day: 0.25    Types: Cigarettes   Smokeless tobacco: Never  Vaping Use   Vaping status: Never Used  Substance and Sexual Activity   Alcohol use: Yes    Alcohol/week: 0.0 standard drinks of alcohol    Comment: Occasioanl glass of wine    Drug use: No   Sexual activity: Not Currently    Birth control/protection: Surgical  Other Topics Concern   Not on file  Social History Narrative   Left Handed   2-3 Cups of Coffee per Day   Social Drivers of Health   Financial Resource Strain: Not on file  Food Insecurity: Not on file  Transportation Needs: Not on file  Physical Activity: Not on file  Stress: Not on file  Social Connections: Not on file  Intimate Partner Violence: Not on file    Physical Exam: Vital signs in last 24 hours: @BP  (!) 153/92   Pulse 87   Temp 98.4 F (36.9 C) (Skin)   Ht 5' 7.5" (1.715 m)   Wt 187 lb (84.8 kg)   SpO2 96%   BMI 28.86 kg/m  GEN: NAD EYE: Sclerae anicteric ENT: MMM CV: Non-tachycardic Pulm: CTA b/l GI: Soft, NT/ND NEURO:   Alert & Oriented x 3   Harry Lindau, DO [facility]   09/20/2023 3:14 PM

## 2023-09-23 ENCOUNTER — Telehealth: Payer: Self-pay

## 2023-09-23 NOTE — Telephone Encounter (Signed)
 Left message

## 2023-09-25 LAB — SURGICAL PATHOLOGY

## 2023-09-28 ENCOUNTER — Encounter: Payer: Self-pay | Admitting: Gastroenterology

## 2023-10-17 ENCOUNTER — Other Ambulatory Visit: Payer: Self-pay | Admitting: Neurology

## 2023-10-17 NOTE — Telephone Encounter (Signed)
 Last seen on 09/16/23 Follow up scheduled 04/27/24   I don't see that you wanted patient to continue medication.   Rx pending

## 2023-10-24 ENCOUNTER — Telehealth (HOSPITAL_BASED_OUTPATIENT_CLINIC_OR_DEPARTMENT_OTHER): Payer: No Typology Code available for payment source | Admitting: Psychiatry

## 2023-10-24 ENCOUNTER — Encounter (HOSPITAL_COMMUNITY): Payer: Self-pay | Admitting: Psychiatry

## 2023-10-24 DIAGNOSIS — F330 Major depressive disorder, recurrent, mild: Secondary | ICD-10-CM | POA: Diagnosis not present

## 2023-10-24 DIAGNOSIS — F419 Anxiety disorder, unspecified: Secondary | ICD-10-CM | POA: Diagnosis not present

## 2023-10-24 MED ORDER — LORAZEPAM 0.5 MG PO TABS: 0.5000 mg | ORAL_TABLET | ORAL | 2 refills | Status: DC | PRN

## 2023-10-24 NOTE — Progress Notes (Signed)
 [facility] MD Virtual Progress Note   Patient Location: Home Provider Location: Office  I connect with patient by telephone and verified that I am speaking with correct person by using two identifiers. I discussed the limitations of evaluation and management by telemedicine and the availability of in person appointments. I also discussed with the patient that there may be a patient responsible charge related to this service. The patient expressed understanding and agreed to proceed.  [name] [id number] 63 y.o.  10/24/2023 10:36 AM  History of Present Illness:  Patient is evaluated by phone session.  She reported some frustration because her disability was denied second time even though she cannot cook, read or drive.  Patient has now a lawyer working on her case.  She is hoping to have a court date in front of a judge for hearing.  Otherwise she reported things are okay.  She denies any irritability, mania, psychosis, hallucination.  She denies any suicidal thoughts or any hopelessness.  Recently had a visit with neurology and she is pleased that her emesis stable.  She also seeing endocrinology for thyroid  issues.  Patient denies any major panic attack.  She does take Ativan  once in a while when she feels very nervous or anxious.  We provided 15 tablets in a month with 2 additional refills and usually it is good for 4 months.  She is taking Prozac  prescribed by her neurology.  Her appetite is okay.  Her weight is stable.  She admitted due to vision problem not able to do a lot of things but able to do her ADLs.  She usually uses microwave to warm the food as cannot cook.  She has no tremors shakes or any EPS.  Her attention concentration is okay.  Patient use [employer] rides and some time her brother and son takes her to the doctor's appointment.  Patient denies drinking or using any illegal substances.  Past Psychiatric History: H/O depression and ADD.  Tried Zoloft, Focalin,  Ritalin  and Strattera.  No history of inpatient or any suicidal attempt.    Outpatient Encounter Medications as of 10/24/2023  Medication Sig   diclofenac  (VOLTAREN ) 75 MG EC tablet Take by mouth as needed.   Dimethyl Fumarate  240 MG CPDR Take 1 capsule (240 mg total) by mouth 2 (two) times daily.   erythromycin  ophthalmic ointment Place 1 inch strip to both eyes every evening. (Patient not taking: Reported on 09/20/2023)   FLUoxetine  (PROZAC ) 20 MG capsule TAKE 3 CAPSULES BY MOUTH EVERY DAY   levothyroxine  (UNITHROID ) 200 MCG tablet Take 1 tablet (200 mcg total) by mouth daily before breakfast.   LORazepam  (ATIVAN ) 0.5 MG tablet Take 1 tablet (0.5 mg total) by mouth as needed for anxiety.   neomycin-polymyxin b-dexamethasone  (MAXITROL) 3.5-10000-0.1 SUSP  (Patient not taking: Reported on 09/20/2023)   pantoprazole  (PROTONIX ) 40 MG tablet Take 1 tablet (40 mg total) by mouth daily.   Vitamin D , Ergocalciferol , (DRISDOL ) 1.25 MG (50000 UNIT) CAPS capsule Take 1 capsule (50,000 Units total) by mouth every 7 (seven) days.   [DISCONTINUED] atenolol  (TENORMIN ) 50 MG tablet Take 1 tablet (50 mg total) by mouth daily. (Patient not taking: No sig reported)   No facility-administered encounter medications on file as of 10/24/2023.    Recent Results (from the past 2160 hours)  Surgical pathology ([facility])     Status: None   Collection Time: 09/20/23 12:00 AM  Result Value Ref Range   SURGICAL PATHOLOGY  SURGICAL PATHOLOGY [facility] [address], [address] [place], [place] [address] Telephone [phone] or [phone] Fax [phone]  REPORT OF SURGICAL PATHOLOGY   Accession #: [id number] Patient Name: CHELCEA, ZAHN Visit # : [id number]  MRN: [id number] Physician: Harry Lindau DOB/Age Apr 13, 1961 (Age: 69) Gender: F Collected Date: 09/20/2023 Received Date: 09/24/2023  FINAL DIAGNOSIS       1. Surgical [P], colon, sigmoid and descending, polyp (2) :        -  TUBULAR ADENOMA.      -  HYPERPLASTIC POLYP.       DATE SIGNED OUT: 09/25/2023 ELECTRONIC SIGNATURE : Legolvan Do, Mark, Pathologist, Electronic Signature  MICROSCOPIC DESCRIPTION  CASE COMMENTS STAINS USED IN DIAGNOSIS: H&E    CLINICAL HISTORY  SPECIMEN(S) OBTAINED 1. Surgical [P], Colon, Sigmoid And Descending, Polyp (2)  SPECIMEN COMMENTS: 1. History of colonic polyps; benign neoplasm of descending colon; benign neoplasm of sigmoid colon SPECIMEN CLINICAL  INFORMATION: 1. R/O adenoma    Gross Description 1. Received in formalin are tan, soft tissue fragments that are submitted in toto. Number: 2, Size: 0.4 cm smallest to 0.6 cm largest, (1B) ( TA )        Report signed out from the following location(s) [place] [address], [place] [address] CLIA #: [id number]  [facility] [address] [place], [place] [address] CLIA #: [id number]      Psychiatric Specialty Exam: Physical Exam  Review of Systems  Eyes:  Positive for visual disturbance.    Weight 187 lb (84.8 kg).There is no height or weight on file to calculate BMI.  General Appearance: NA  Eye Contact:  NA  Speech:  Normal Rate  Volume:  Normal  Mood:  Anxious  Affect:  NA  Thought Process:  Goal Directed  Orientation:  Full (Time, Place, and Person)  Thought Content:  Logical  Suicidal Thoughts:  No  Homicidal Thoughts:  No  Memory:  Immediate;   Good Recent;   Good Remote;   Good  Judgement:  Intact  Insight:  Present  Psychomotor Activity:  Normal  Concentration:  Concentration: Good and Attention Span: Good  Recall:  Good  Fund of Knowledge:  Good  Language:  Good  Akathisia:  No  Handed:  Right  AIMS (if indicated):     Assets:  Communication Skills Desire for Improvement Housing Social Support  ADL's:  Intact  Cognition:  WNL  Sleep: Okay       12/15/2020    3:18 PM 03/13/2016    2:33 PM  Depression screen PHQ  2/9  Decreased Interest 0 0  Down, Depressed, Hopeless 0 0  PHQ - 2 Score 0 0  Altered sleeping 0   Tired, decreased energy 0   Change in appetite 0   Feeling bad or failure about yourself  0   Trouble concentrating 0   Moving slowly or fidgety/restless 0   Suicidal thoughts 0   PHQ-9 Score 0     Assessment/Plan: MDD (major depressive disorder), recurrent episode, mild (HCC) - Plan: LORazepam  (ATIVAN ) 0.5 MG tablet  Anxiety - Plan: LORazepam  (ATIVAN ) 0.5 MG tablet  Patient is stable on current medication.  She is disappointed as disability was denied second time but now she has a Clinical research associate who is working on her case and hoping to get a hearing date in front of the judge.  She does not want to change the medication.  She  is getting Prozac  60 mg from her neurologist which is helping her depression.  She takes as needed lorazepam  0.5 mg and we provided 15 tablets with 2 additional refill and she is in now for 4 months.  Patient not interested in therapy.  Discussed benzodiazepine dependence tolerance and withdrawal.  Encouraged to keep appointment with endocrinologist.  Recommend to call us  back if she has any question or any concern.  Follow-up in 4 months.   Follow Up Instructions:     I discussed the assessment and treatment plan with the patient. The patient was provided an opportunity to ask questions and all were answered. The patient agreed with the plan and demonstrated an understanding of the instructions.   The patient was advised to call back or seek an in-person evaluation if the symptoms worsen or if the condition fails to improve as anticipated.    Collaboration of Care: Other provider involved in patient's care AEB notes are available in epic to review  Patient/Guardian was advised Release of Information must be obtained prior to any record release in order to collaborate their care with an outside provider. Patient/Guardian was advised if they have not already done so to  contact the registration department to sign all necessary forms in order for us  to release information regarding their care.   Consent: Patient/Guardian gives verbal consent for treatment and assignment of benefits for services provided during this visit. Patient/Guardian expressed understanding and agreed to proceed.     Total encounter time 11 minutes which includes face-to-face time, chart reviewed, care coordination, order entry and documentation during this encounter.   Note: This document was prepared by [employer] voice dictation technology and any errors that results from this process are unintentional.    Arturo Late, MD 10/24/2023

## 2023-10-28 ENCOUNTER — Other Ambulatory Visit

## 2023-10-29 ENCOUNTER — Ambulatory Visit: Payer: Self-pay | Admitting: Endocrinology

## 2023-10-29 LAB — T4, FREE: Free T4: 2 ng/dL — ABNORMAL HIGH (ref 0.8–1.8)

## 2023-10-29 LAB — TSH: TSH: 0.03 m[IU]/L — ABNORMAL LOW (ref 0.40–4.50)

## 2023-10-31 ENCOUNTER — Ambulatory Visit: Admitting: Endocrinology

## 2023-10-31 ENCOUNTER — Encounter: Payer: Self-pay | Admitting: Endocrinology

## 2023-10-31 VITALS — BP 138/72 | HR 78 | Resp 20 | Ht 67.5 in | Wt 182.6 lb

## 2023-10-31 DIAGNOSIS — Z9889 Other specified postprocedural states: Secondary | ICD-10-CM | POA: Diagnosis not present

## 2023-10-31 DIAGNOSIS — E890 Postprocedural hypothyroidism: Secondary | ICD-10-CM | POA: Diagnosis not present

## 2023-10-31 DIAGNOSIS — E0500 Thyrotoxicosis with diffuse goiter without thyrotoxic crisis or storm: Secondary | ICD-10-CM

## 2023-10-31 DIAGNOSIS — Z8639 Personal history of other endocrine, nutritional and metabolic disease: Secondary | ICD-10-CM

## 2023-10-31 DIAGNOSIS — Z9089 Acquired absence of other organs: Secondary | ICD-10-CM

## 2023-10-31 MED ORDER — LEVOTHYROXINE SODIUM 175 MCG PO TABS: 175.0000 ug | ORAL_TABLET | Freq: Every day | ORAL | 3 refills | Status: AC

## 2023-10-31 NOTE — Progress Notes (Signed)
 Outpatient Endocrinology Note [place] Ernst Cumpston, MD  11/01/23  Patient's Name: [name]    DOB: 1960-09-13    MRN: [id number]  REASON OF VISIT: Follow-up for postsurgical hypothyroidism  PCP: Pcp, No  HISTORY OF PRESENT ILLNESS:   [name] is a 63 y.o. old female with past medical history as listed below is presented for a follow up of postsurgical hypothyroidism.   Pertinent Thyroid  History: Patient was previously seen by Dr. Hubert Madden and was last time seen in July 2024. Patient was diagnosed with Graves' disease in October 2021, initially had typical symptoms of palpitation, dizziness, heat intolerance, and tremors and weight loss.  She had the symptoms after she had COVID booster shot in September 2021.  She was initially treated with methimazole  10 mg 2 times a day.  She had variable thyroid  hormone levels fluctuating between hyper and hypothyroidism on methimazole .  Because of persistent hyperthyroidism along with Graves' ophthalmopathy, was recommended for thyroidectomy and underwent total thyroidectomy in April 06, 2021.  Pathology was benign consistent with multinodular hyperplasia, negative for malignancy.  Patient was started on levothyroxine  after the surgery and require frequent adjustment of the dose.  Patient has required relatively high dose of levothyroxine .  She was on levothyroxine  up to 250 mcg and then to 312 mcg daily.  In June 2024 she had TSH of 19, levothyroxine /Synthroid  was sent to unithyroid 350 mcg daily in July 2024.  Levothyroxine  dose has been gradually decreased from the visit in October 2024.  Tirosint  and liquid thyroxine were not covered by insurance.  Graves' eye disease [name]' ophthalmopathy : Patient has been following at [facility].  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  Interval history  Patient has been taking levothyroxine  200 mcg daily.  Denies  palpitation or heat intolerance.  Overall feeling good.  Has not felt any difference in the symptoms.  Patient reports she is no longer following with [facility] ophthalmology, waiting to find different ophthalmology.  Recent thyroid  function test improving otherwise still free T4 is mildly elevated and low TSH as follows.   Latest Reference Range & Units 10/28/23 11:04  TSH 0.40 - 4.50 mIU/L 0.03 (L)  T4,Free(Direct) 0.8 - 1.8 ng/dL 2.0 (H)  (L): Data is abnormally low (H): Data is abnormally high  REVIEW OF SYSTEMS:  As per history of present illness.   PAST MEDICAL HISTORY: Past Medical History:  Diagnosis Date   ADD (attention deficit disorder with hyperactivity)    Anxiety    Arthritis    Cancer (HCC)    Skin cancer- basal cell, squamous cell   Cervical dysplasia    Family history of adverse reaction to anesthesia    mother due to mitral valve regergitation   GERD (gastroesophageal reflux disease)    Graves disease    Headache    migraine   Hepatitis    Pt states Core and surface antibioties for Hepatitis whens he was 18   Hyperthyroidism    Mitral valve prolapse    Multiple sclerosis (HCC)    Multiple sclerosis (HCC)    Pneumonia    Tendonitis in rigtht wrist    Thyroid  eye disease     PAST SURGICAL HISTORY: Past Surgical History:  Procedure Laterality Date   ABDOMINAL HYSTERECTOMY  2004   ovaries retained   BACK SURGERY     heart ablation     15 yrs ago for a fib by Dr.Klein,  everything ok   KNEE ARTHROSCOPY WITH SUBCHONDROPLASTY Left 11/13/2017   Procedure: LEFT KNEE ARTHROSCOPY WITH SUBCHONDROPLASTY, PARTIAL MEDIAL MENISCECTOMY, SYNOVECTOMY;  Surgeon: Wes Hamman, MD;  Location: [facility];  Service: Orthopedics;  Laterality: Left;   THYROIDECTOMY N/A 04/06/2021   Procedure: TOTAL THYROIDECTOMY;  Surgeon: Oralee Billow, MD;  Location: [facility];  Service: General;  Laterality: N/A;   TUBAL LIGATION      ALLERGIES: Allergies  Allergen  Reactions   Methylprednisolone  Hives   Solu-Medrol  [Methylprednisolone  Sodium Succ] Hives and Itching    Wheezing (was getting with Ocrevus  as well)   Cephalexin Hives and Itching   Ocrelizumab  Palpitations and Other (See Comments)    (Ocrevus ) Wheezing.    FAMILY HISTORY:  Family History  Problem Relation Age of Onset   Depression Mother    Stroke Mother    Heart disease Mother    Dementia Mother    Hypothyroidism Mother    Stroke Father    Heart disease Father    Healthy Brother    Parkinson's disease Maternal Grandmother    Parkinson's disease Maternal Grandfather    Heart disease Paternal Grandmother    COPD Paternal Grandfather    Cancer Neg Hx    Diabetes Neg Hx    Breast cancer Neg Hx    Multiple sclerosis Neg Hx     SOCIAL HISTORY: Social History   Socioeconomic History   Marital status: Divorced    Spouse name: Not on file   Number of children: Not on file   Years of education: Not on file   Highest education level: Not on file  Occupational History   Not on file  Tobacco Use   Smoking status: Every Day    Current packs/day: 0.25    Types: Cigarettes   Smokeless tobacco: Never  Vaping Use   Vaping status: Never Used  Substance and Sexual Activity   Alcohol use: Yes    Alcohol/week: 0.0 standard drinks of alcohol    Comment: Occasioanl glass of wine    Drug use: No   Sexual activity: Not Currently    Birth control/protection: Surgical  Other Topics Concern   Not on file  Social History Narrative   Left Handed   2-3 Cups of Coffee per Day   Social Drivers of Health   Financial Resource Strain: Not on file  Food Insecurity: Not on file  Transportation Needs: Not on file  Physical Activity: Not on file  Stress: Not on file  Social Connections: Not on file    MEDICATIONS:  Current Outpatient Medications  Medication Sig Dispense Refill   Dimethyl Fumarate  240 MG CPDR Take 1 capsule (240 mg total) by mouth 2 (two) times daily. (Patient  taking differently: Take 240 mg by mouth daily.) 180 capsule 3   erythromycin  ophthalmic ointment Place 1 inch strip to both eyes every evening. 3.5 g 11   FLUoxetine  (PROZAC ) 20 MG capsule TAKE 3 CAPSULES BY MOUTH EVERY DAY 270 capsule 3   LORazepam  (ATIVAN ) 0.5 MG tablet Take 1 tablet (0.5 mg total) by mouth as needed for anxiety. 15 tablet 2   pantoprazole  (PROTONIX ) 40 MG tablet Take 1 tablet (40 mg total) by mouth daily. 90 tablet 3   Vitamin D , Ergocalciferol , (DRISDOL ) 1.25 MG (50000 UNIT) CAPS capsule Take 1 capsule (50,000 Units total) by mouth every 7 (seven) days. 13 capsule 3   diclofenac  (VOLTAREN ) 75 MG EC tablet Take by mouth as needed. (Patient not taking: Reported on 10/31/2023)  levothyroxine  (UNITHROID ) 175 MCG tablet Take 1 tablet (175 mcg total) by mouth daily before breakfast. 90 tablet 3   neomycin-polymyxin b-dexamethasone  (MAXITROL) 3.5-10000-0.1 SUSP  (Patient not taking: Reported on 10/31/2023)     No current facility-administered medications for this visit.    PHYSICAL EXAM: Vitals:   10/31/23 1402  BP: 138/72  Pulse: 78  Resp: 20  SpO2: 95%  Weight: 182 lb 9.6 oz (82.8 kg)  Height: 5' 7.5 (1.715 m)   Body mass index is 28.18 kg/m.  Wt Readings from Last 3 Encounters:  10/31/23 182 lb 9.6 oz (82.8 kg)  09/20/23 187 lb (84.8 kg)  09/16/23 187 lb (84.8 kg)     General: Well developed, well nourished female in no apparent distress.  HEENT: AT/[place], no external lesions. Hearing intact to the spoken word Eyes: Right eye postoperative changes, proptosis +, no redness or wartering.  Neck: Trachea midline, neck supple, anterior neck scar + s/p thyroidectomy.  Lungs: Clear to auscultation, no wheeze. Respirations not labored Heart: S1S2, Regular in rate and rhythm.  Abdomen: Soft, non tender, non distended Neurologic: Alert, oriented, normal speech, deep tendon biceps reflexes normal,  no gross focal neurological deficit Extremities: No pedal pitting edema, no  tremors of outstretched hands Skin: Warm, color good.  Psychiatric: Does not appear depressed or anxious  PERTINENT HISTORIC LABORATORY AND IMAGING STUDIES:  All pertinent laboratory results were reviewed. Please see HPI also for further details.   TSH  Date Value Ref Range Status  10/28/2023 0.03 (L) 0.40 - 4.50 mIU/L Final  07/25/2023 0.01 (L) 0.40 - 4.50 mIU/L Final  05/20/2023 0.01 (L) 0.40 - 4.50 mIU/L Final   T3, Total  Date Value Ref Range Status  02/03/2020 321 (H) 71 - 180 ng/dL Final     ASSESSMENT / PLAN  1. H/O Graves' disease   2. Postoperative hypothyroidism   3. Graves' ophthalmopathy   4. History of total thyroidectomy    -Patient has history of Graves' disease status post total thyroidectomy in November 2022.  Patient has postoperative hypothyroidism.  She has been requiring relatively high dose of thyroid  hormone replacement.  She used to be on levothyroxine  and changed to unithyroid from July 2024. -Currently taking unithyroid 200 mcg daily.  Recent thyroid  lab consistent with over replacement of thyroid  hormone.  Thyroid  levels improving although still free T4 is elevated and low TSH.  Plan: -Decreased levothyroxine /unithyroid from 200 mcg daily to 175 mcg daily. -Check thyroid  function test TSH, free T4 in 3 months.  Prior to follow-up visit.  # [name]' eye disease follow-up with ophthalmology.   Diagnoses and all orders for this visit:  H/O Graves' disease  Postoperative hypothyroidism -     levothyroxine  (UNITHROID ) 175 MCG tablet; Take 1 tablet (175 mcg total) by mouth daily before breakfast. -     T4, free -     TSH  Graves' ophthalmopathy  History of total thyroidectomy    DISPOSITION Follow up in clinic in 3 months suggested.  All questions answered and patient verbalized understanding of the plan.  [place] Harshitha Fretz, MD [facility] Endocrinology [facility] [address] [place], [address] [place], [place] [address] Phone #  [phone]  At least part of this note was generated using voice recognition software. Inadvertent word errors may have occurred, which were not recognized during the proofreading process.

## 2023-11-01 ENCOUNTER — Encounter: Payer: Self-pay | Admitting: Endocrinology

## 2024-02-10 ENCOUNTER — Other Ambulatory Visit

## 2024-02-10 LAB — TSH: TSH: 1.31 m[IU]/L (ref 0.40–4.50)

## 2024-02-10 LAB — T4, FREE: Free T4: 1.7 ng/dL (ref 0.8–1.8)

## 2024-02-11 ENCOUNTER — Ambulatory Visit: Payer: Self-pay | Admitting: Endocrinology

## 2024-02-14 ENCOUNTER — Encounter: Payer: Self-pay | Admitting: Endocrinology

## 2024-02-14 ENCOUNTER — Ambulatory Visit: Admitting: Endocrinology

## 2024-02-14 VITALS — BP 136/70 | HR 78 | Resp 20 | Ht 67.5 in | Wt 186.8 lb

## 2024-02-14 DIAGNOSIS — Z9089 Acquired absence of other organs: Secondary | ICD-10-CM

## 2024-02-14 DIAGNOSIS — E890 Postprocedural hypothyroidism: Secondary | ICD-10-CM

## 2024-02-14 DIAGNOSIS — Z9889 Other specified postprocedural states: Secondary | ICD-10-CM

## 2024-02-14 DIAGNOSIS — Z8639 Personal history of other endocrine, nutritional and metabolic disease: Secondary | ICD-10-CM | POA: Diagnosis not present

## 2024-02-14 DIAGNOSIS — E0500 Thyrotoxicosis with diffuse goiter without thyrotoxic crisis or storm: Secondary | ICD-10-CM

## 2024-02-14 NOTE — Progress Notes (Signed)
 Outpatient Endocrinology Note [place] Tirza Senteno, MD  02/14/24  Patient's Name: [name]    DOB: 1960-08-31    MRN: [id number]  REASON OF VISIT: Follow-up for postsurgical hypothyroidism  PCP: Pcp, No  HISTORY OF PRESENT ILLNESS:   [name] is a 63 y.o. old female with past medical history as listed below is presented for a follow up of postsurgical hypothyroidism.   Pertinent Thyroid  History: Patient was previously seen by Dr. Von and was last time seen in July 2024. Patient was diagnosed with Graves' disease in October 2021, initially had typical symptoms of palpitation, dizziness, heat intolerance, and tremors and weight loss.  She had the symptoms after she had COVID booster shot in September 2021.  She was initially treated with methimazole  10 mg 2 times a day.  She had variable thyroid  hormone levels fluctuating between hyper and hypothyroidism on methimazole .  Because of persistent hyperthyroidism along with Graves' ophthalmopathy, was recommended for thyroidectomy and underwent total thyroidectomy in April 06, 2021.  Pathology was benign consistent with multinodular hyperplasia, negative for malignancy.  Patient was started on levothyroxine  after the surgery and require frequent adjustment of the dose.  Patient has required relatively high dose of levothyroxine .  She was on levothyroxine  up to 250 mcg and then to 312 mcg daily.  In June 2024 she had TSH of 19, levothyroxine /Synthroid  was sent to unithyroid 350 mcg daily in July 2024.  Levothyroxine  dose has been gradually decreased from the visit in October 2024, was decreased to 175 mg daily from 200 mcg in June 2025.  Tirosint  and liquid thyroxine were not covered by insurance.  Graves' eye disease [name] ophthalmopathy : Patient has been following at [facility].  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  Interval history  Patient  has been taking levothyroxine  175 mg daily.  She takes in the morning.  She has not felt any new symptoms.  Overall feeling usual energy.  No cold intolerance.  Patient thyroid  function test has normalized as follows.   Latest Reference Range & Units 02/10/24 10:30  TSH 0.40 - 4.50 mIU/L 1.31  T4,Free(Direct) 0.8 - 1.8 ng/dL 1.7    REVIEW OF SYSTEMS:  As per history of present illness.   PAST MEDICAL HISTORY: Past Medical History:  Diagnosis Date   ADD (attention deficit disorder with hyperactivity)    Anxiety    Arthritis    Cancer (HCC)    Skin cancer- basal cell, squamous cell   Cervical dysplasia    Family history of adverse reaction to anesthesia    mother due to mitral valve regergitation   GERD (gastroesophageal reflux disease)    Graves disease    Headache    migraine   Hepatitis    Pt states Core and surface antibioties for Hepatitis whens he was 18   Hyperthyroidism    Mitral valve prolapse    Multiple sclerosis    Multiple sclerosis    Pneumonia    Tendonitis in rigtht wrist    Thyroid  eye disease     PAST SURGICAL HISTORY: Past Surgical History:  Procedure Laterality Date   ABDOMINAL HYSTERECTOMY  2004   ovaries retained   BACK SURGERY     heart ablation     15 yrs ago for a fib by Dr.Klein, everything ok   KNEE ARTHROSCOPY WITH SUBCHONDROPLASTY Left 11/13/2017   Procedure: LEFT KNEE ARTHROSCOPY WITH SUBCHONDROPLASTY, PARTIAL MEDIAL MENISCECTOMY, SYNOVECTOMY;  Surgeon:  Jerri Kay HERO, MD;  Location: [facility];  Service: Orthopedics;  Laterality: Left;   THYROIDECTOMY N/A 04/06/2021   Procedure: TOTAL THYROIDECTOMY;  Surgeon: Eletha Boas, MD;  Location: [facility];  Service: General;  Laterality: N/A;   TUBAL LIGATION      ALLERGIES: Allergies  Allergen Reactions   Methylprednisolone  Hives   Solu-Medrol  [Methylprednisolone  Sodium Succ] Hives and Itching    Wheezing (was getting with Ocrevus  as well)   Cephalexin Hives and Itching    Ocrelizumab  Palpitations and Other (See Comments)    (Ocrevus ) Wheezing.    FAMILY HISTORY:  Family History  Problem Relation Age of Onset   Depression Mother    Stroke Mother    Heart disease Mother    Dementia Mother    Hypothyroidism Mother    Stroke Father    Heart disease Father    Healthy Brother    Parkinson's disease Maternal Grandmother    Parkinson's disease Maternal Grandfather    Heart disease Paternal Grandmother    COPD Paternal Grandfather    Cancer Neg Hx    Diabetes Neg Hx    Breast cancer Neg Hx    Multiple sclerosis Neg Hx     SOCIAL HISTORY: Social History   Socioeconomic History   Marital status: Divorced    Spouse name: Not on file   Number of children: Not on file   Years of education: Not on file   Highest education level: Not on file  Occupational History   Not on file  Tobacco Use   Smoking status: Every Day    Current packs/day: 0.25    Types: Cigarettes   Smokeless tobacco: Never  Vaping Use   Vaping status: Never Used  Substance and Sexual Activity   Alcohol use: Yes    Alcohol/week: 0.0 standard drinks of alcohol    Comment: Occasioanl glass of wine    Drug use: No   Sexual activity: Not Currently    Birth control/protection: Surgical  Other Topics Concern   Not on file  Social History Narrative   Left Handed   2-3 Cups of Coffee per Day   Social Drivers of Health   Financial Resource Strain: Not on file  Food Insecurity: Not on file  Transportation Needs: Not on file  Physical Activity: Not on file  Stress: Not on file  Social Connections: Not on file    MEDICATIONS:  Current Outpatient Medications  Medication Sig Dispense Refill   diclofenac  (VOLTAREN ) 75 MG EC tablet Take by mouth as needed.     Dimethyl Fumarate  240 MG CPDR Take 1 capsule (240 mg total) by mouth 2 (two) times daily. (Patient taking differently: Take 240 mg by mouth daily.) 180 capsule 3   erythromycin  ophthalmic ointment Place 1 inch strip to  both eyes every evening. 3.5 g 11   FLUoxetine  (PROZAC ) 20 MG capsule TAKE 3 CAPSULES BY MOUTH EVERY DAY 270 capsule 3   levothyroxine  (UNITHROID ) 175 MCG tablet Take 1 tablet (175 mcg total) by mouth daily before breakfast. 90 tablet 3   LORazepam  (ATIVAN ) 0.5 MG tablet Take 1 tablet (0.5 mg total) by mouth as needed for anxiety. 15 tablet 2   neomycin-polymyxin b-dexamethasone  (MAXITROL) 3.5-10000-0.1 SUSP      pantoprazole  (PROTONIX ) 40 MG tablet Take 1 tablet (40 mg total) by mouth daily. 90 tablet 3   Vitamin D , Ergocalciferol , (DRISDOL ) 1.25 MG (50000 UNIT) CAPS capsule Take 1 capsule (50,000 Units total) by mouth every 7 (seven) days.  13 capsule 3   No current facility-administered medications for this visit.    PHYSICAL EXAM: Vitals:   02/14/24 1049  BP: 136/70  Pulse: 78  Resp: 20  SpO2: 94%  Weight: 186 lb 12.8 oz (84.7 kg)  Height: 5' 7.5 (1.715 m)   Body mass index is 28.83 kg/m.  Wt Readings from Last 3 Encounters:  02/14/24 186 lb 12.8 oz (84.7 kg)  10/31/23 182 lb 9.6 oz (82.8 kg)  09/20/23 187 lb (84.8 kg)     General: Well developed, well nourished female in no apparent distress.  HEENT: AT/[place], no external lesions. Hearing intact to the spoken word Eyes: Right eye postoperative changes, proptosis +, no redness or wartering.  Neck: Trachea midline, neck supple, anterior neck scar + s/p thyroidectomy.  Lungs: Clear to auscultation, no wheeze. Respirations not labored Heart: S1S2, Regular in rate and rhythm.  Abdomen: Soft, non tender, non distended Neurologic: Alert, oriented, normal speech, deep tendon biceps reflexes normal,  no gross focal neurological deficit Extremities: No pedal pitting edema, no tremors of outstretched hands Skin: Warm, color good.  Psychiatric: Does not appear depressed or anxious  PERTINENT HISTORIC LABORATORY AND IMAGING STUDIES:  All pertinent laboratory results were reviewed. Please see HPI also for further details.   TSH  Date  Value Ref Range Status  02/10/2024 1.31 0.40 - 4.50 mIU/L Final  10/28/2023 0.03 (L) 0.40 - 4.50 mIU/L Final  07/25/2023 0.01 (L) 0.40 - 4.50 mIU/L Final   T3, Total  Date Value Ref Range Status  02/03/2020 321 (H) 71 - 180 ng/dL Final     ASSESSMENT / PLAN  1. Postoperative hypothyroidism   2. H/O Graves' disease   3. Graves' ophthalmopathy   4. History of total thyroidectomy    -Patient has history of Graves' disease status post total thyroidectomy in November 2022.  Patient has postoperative hypothyroidism.  She has been requiring relatively high dose of thyroid  hormone replacement.  She used to be on levothyroxine  and changed to unithyroid from July 2024. -Currently taking unithyroid 175 mcg daily.  Plan: - Recent thyroid  function test normal.  Continue current dose of levothyroxine /unithyroid 175 mcg daily. -Check thyroid  function test TSH, free T4 in 5 months.  Prior to follow-up visit.  # Yvone' eye disease follow-up with ophthalmology.  She wants find local ophthalmology for the follow-up, she used to follow-up at [facility] in the past.   Diagnoses and all orders for this visit:  Postoperative hypothyroidism -     T4, free -     TSH  H/O Graves' disease  Graves' ophthalmopathy  History of total thyroidectomy     DISPOSITION Follow up in clinic in 5 months suggested.  All questions answered and patient verbalized understanding of the plan.  [place] Jourdan Durbin, MD [facility] Endocrinology [facility] [address] [place], [address] [place], [place] [address] Phone # [phone]  At least part of this note was generated using voice recognition software. Inadvertent word errors may have occurred, which were not recognized during the proofreading process.

## 2024-02-21 ENCOUNTER — Encounter: Payer: Self-pay | Admitting: Endocrinology

## 2024-02-21 ENCOUNTER — Encounter (HOSPITAL_COMMUNITY): Payer: Self-pay | Admitting: Psychiatry

## 2024-02-21 ENCOUNTER — Telehealth (HOSPITAL_COMMUNITY): Admitting: Psychiatry

## 2024-02-21 VITALS — Wt 186.0 lb

## 2024-02-21 DIAGNOSIS — F419 Anxiety disorder, unspecified: Secondary | ICD-10-CM | POA: Diagnosis not present

## 2024-02-21 DIAGNOSIS — F330 Major depressive disorder, recurrent, mild: Secondary | ICD-10-CM | POA: Diagnosis not present

## 2024-02-21 MED ORDER — LORAZEPAM 0.5 MG PO TABS: 0.5000 mg | ORAL_TABLET | ORAL | 2 refills | Status: DC | PRN

## 2024-02-21 NOTE — Progress Notes (Signed)
 [facility] MD Virtual Progress Note   Patient Location: Home Provider Location: Home Office  I connect with patient by telephone and verified that I am speaking with correct person by using two identifiers. I discussed the limitations of evaluation and management by telemedicine and the availability of in person appointments. I also discussed with the patient that there may be a patient responsible charge related to this service. The patient expressed understanding and agreed to proceed.  [name] [id number] 63 y.o.  02/21/2024 9:17 AM  History of Present Illness:  Patient is evaluated by phone session.  She cannot do video and cannot drive because of her vision problem.  She is very anxious as in 3 weeks she has a court hearing for her disability.  It has been denied twice and now she is working with a Clinical research associate.  She had a lot of nervousness about the court hearing.  She had a good support from her brother who live in town, son and his wife who also lives in town and good social network of friends.  She has been taking Ativan  more frequently because she feels very nervous and anxious about this hitting date.  However she is sleeping okay.  Recently she has a visit with endocrinologist.  She is seeing neurologist for her MS.  She has Graves' disease and optic neuritis.  She is somewhat frustrated as twice her disability is denied.  She denies any hallucination, paranoia, suicidal thoughts but there are episodes of crying and disappointment when her disability was denied twice.  She reported appetite is okay and weight is stable.  She is taking Prozac  prescribed by neurology.  She uses [facility] for doctors appointment as cannot drive.  She also cannot read, cannot cook and usually eat prepackaged meal.  So far tolerating Prozac  60 mg and denies any tremor or shakes or any EPS.  She denies drinking or using any illegal substances.  We have provided Ativan  0.5 mg 15 tablets which usually  last for months but she is completely out and need a new prescription.  Past Psychiatric History: H/O depression and ADD.  Tried Zoloft, Focalin, Ritalin  and Strattera.  No history of inpatient or any suicidal attempt.   Past Medical History:  Diagnosis Date   ADD (attention deficit disorder with hyperactivity)    Anxiety    Arthritis    Cancer (HCC)    Skin cancer- basal cell, squamous cell   Cervical dysplasia    Family history of adverse reaction to anesthesia    mother due to mitral valve regergitation   GERD (gastroesophageal reflux disease)    Graves disease    Headache    migraine   Hepatitis    Pt states Core and surface antibioties for Hepatitis whens he was 18   Hyperthyroidism    Mitral valve prolapse    Multiple sclerosis    Multiple sclerosis    Pneumonia    Tendonitis in rigtht wrist    Thyroid  eye disease     Outpatient Encounter Medications as of 02/21/2024  Medication Sig   diclofenac  (VOLTAREN ) 75 MG EC tablet Take by mouth as needed.   Dimethyl Fumarate  240 MG CPDR Take 1 capsule (240 mg total) by mouth 2 (two) times daily. (Patient taking differently: Take 240 mg by mouth daily.)   erythromycin  ophthalmic ointment Place 1 inch strip to both eyes every evening.   FLUoxetine  (PROZAC ) 20 MG capsule TAKE 3 CAPSULES BY MOUTH EVERY DAY   levothyroxine  (  UNITHROID ) 175 MCG tablet Take 1 tablet (175 mcg total) by mouth daily before breakfast.   LORazepam  (ATIVAN ) 0.5 MG tablet Take 1 tablet (0.5 mg total) by mouth as needed for anxiety.   neomycin-polymyxin b-dexamethasone  (MAXITROL) 3.5-10000-0.1 SUSP    pantoprazole  (PROTONIX ) 40 MG tablet Take 1 tablet (40 mg total) by mouth daily.   Vitamin D , Ergocalciferol , (DRISDOL ) 1.25 MG (50000 UNIT) CAPS capsule Take 1 capsule (50,000 Units total) by mouth every 7 (seven) days.   [DISCONTINUED] atenolol  (TENORMIN ) 50 MG tablet Take 1 tablet (50 mg total) by mouth daily. (Patient not taking: No sig reported)   No  facility-administered encounter medications on file as of 02/21/2024.    Recent Results (from the past 2160 hours)  T4, free     Status: None   Collection Time: 02/10/24 10:30 AM  Result Value Ref Range   Free T4 1.7 0.8 - 1.8 ng/dL  TSH     Status: None   Collection Time: 02/10/24 10:30 AM  Result Value Ref Range   TSH 1.31 0.40 - 4.50 mIU/L     Psychiatric Specialty Exam: Physical Exam  Review of Systems  Eyes:  Positive for visual disturbance.    Weight 186 lb (84.4 kg).There is no height or weight on file to calculate BMI.  General Appearance: NA  Eye Contact:  NA  Speech:  Slow  Volume:  Decreased  Mood:  Anxious and Dysphoric  Affect:  NA  Thought Process:  Descriptions of Associations: Intact  Orientation:  Full (Time, Place, and Person)  Thought Content:  Rumination  Suicidal Thoughts:  No  Homicidal Thoughts:  No  Memory:  Immediate;   Good Recent;   Good Remote;   Good  Judgement:  Intact  Insight:  Present  Psychomotor Activity:  NA  Concentration:  Concentration: Good and Attention Span: Good  Recall:  Good  Fund of Knowledge:  Good  Language:  Good  Akathisia:  No  Handed:  Right  AIMS (if indicated):     Assets:  Communication Skills Desire for Improvement Housing Social Support  ADL's:  Intact  Cognition:  WNL  Sleep:  ok       12/15/2020    3:18 PM 03/13/2016    2:33 PM  Depression screen PHQ 2/9  Decreased Interest 0 0  Down, Depressed, Hopeless 0 0  PHQ - 2 Score 0 0  Altered sleeping 0   Tired, decreased energy 0   Change in appetite 0   Feeling bad or failure about yourself  0   Trouble concentrating 0   Moving slowly or fidgety/restless 0   Suicidal thoughts 0   PHQ-9 Score 0     Assessment/Plan: MDD (major depressive disorder), recurrent episode, mild - Plan: LORazepam  (ATIVAN ) 0.5 MG tablet  Anxiety - Plan: LORazepam  (ATIVAN ) 0.5 MG tablet  Patient is 63 year old female with history of MS, optic neuritis, major  depressive disorder and anxiety.  Discussed increase anxiety due to upcoming court date.  She is hoping it get approved but also concern due to recent multiple situation may have issues getting approved.  She has a backup plan to start taking money out of her Social Security disability which she trying to avoid.  She is hoping it will be a big relief if her disability approved.  I offered therapy but patient not interested.  She does not feel her depression is as bad and medicine working.  Will provide a new prescription of Ativan  to take 0.5  mg as needed for severe anxiety.  She will continue Prozac  60 mg prescribed by neurology.  Discussed to use coping skills to help her anxiety.  Recommend to call back if she is any question or any concern.  Follow-up in 4 months.   Follow Up Instructions:     I discussed the assessment and treatment plan with the patient. The patient was provided an opportunity to ask questions and all were answered. The patient agreed with the plan and demonstrated an understanding of the instructions.   The patient was advised to call back or seek an in-person evaluation if the symptoms worsen or if the condition fails to improve as anticipated.    Collaboration of Care: Other provider involved in patient's care AEB notes are available in epic to review.  Patient/Guardian was advised Release of Information must be obtained prior to any record release in order to collaborate their care with an outside provider. Patient/Guardian was advised if they have not already done so to contact the registration department to sign all necessary forms in order for us  to release information regarding their care.   Consent: Patient/Guardian gives verbal consent for treatment and assignment of benefits for services provided during this visit. Patient/Guardian expressed understanding and agreed to proceed.     Total encounter time 22 minutes which includes face-to-face time, chart reviewed,  care coordination, order entry and documentation during this encounter.   Note: This document was prepared by [employer] voice dictation technology and any errors that results from this process are unintentional.    Leni ONEIDA Client, MD 02/21/2024

## 2024-02-24 ENCOUNTER — Telehealth (HOSPITAL_COMMUNITY): Admitting: Psychiatry

## 2024-02-24 ENCOUNTER — Other Ambulatory Visit: Payer: Self-pay | Admitting: Endocrinology

## 2024-02-24 DIAGNOSIS — E0500 Thyrotoxicosis with diffuse goiter without thyrotoxic crisis or storm: Secondary | ICD-10-CM

## 2024-02-24 DIAGNOSIS — Z8639 Personal history of other endocrine, nutritional and metabolic disease: Secondary | ICD-10-CM

## 2024-02-27 ENCOUNTER — Encounter: Payer: Self-pay | Admitting: Endocrinology

## 2024-03-03 ENCOUNTER — Encounter: Payer: Self-pay | Admitting: Endocrinology

## 2024-04-27 ENCOUNTER — Ambulatory Visit: Admitting: Neurology

## 2024-04-27 ENCOUNTER — Encounter: Payer: Self-pay | Admitting: Neurology

## 2024-04-27 VITALS — BP 123/72 | HR 82 | Resp 14 | Ht 67.5 in

## 2024-04-27 DIAGNOSIS — H5789 Other specified disorders of eye and adnexa: Secondary | ICD-10-CM | POA: Diagnosis not present

## 2024-04-27 DIAGNOSIS — Z79899 Other long term (current) drug therapy: Secondary | ICD-10-CM | POA: Diagnosis not present

## 2024-04-27 DIAGNOSIS — R32 Unspecified urinary incontinence: Secondary | ICD-10-CM

## 2024-04-27 DIAGNOSIS — E079 Disorder of thyroid, unspecified: Secondary | ICD-10-CM

## 2024-04-27 DIAGNOSIS — G35A Relapsing-remitting multiple sclerosis: Secondary | ICD-10-CM | POA: Diagnosis not present

## 2024-04-27 DIAGNOSIS — E0500 Thyrotoxicosis with diffuse goiter without thyrotoxic crisis or storm: Secondary | ICD-10-CM | POA: Diagnosis not present

## 2024-04-27 DIAGNOSIS — R5383 Other fatigue: Secondary | ICD-10-CM

## 2024-04-27 DIAGNOSIS — E559 Vitamin D deficiency, unspecified: Secondary | ICD-10-CM

## 2024-04-27 NOTE — Progress Notes (Signed)
 [facility]  PATIENT: [name] DOB: 1961/03/12     HISTORICAL  CHIEF COMPLAINT:  Chief Complaint  Patient presents with   Multiple Sclerosis    Rm10, alone, Ms follow up     HISTORY OF PRESENT ILLNESS:  [name] is a 63 y.o.woman with relapsing remitting multiple sclerosis.   Update 04/27/2024 She is DMF.  Now getting it at [facility] at $20/month   No exacerbations > 10 year.  However, MRI 03/02/2019 showed one new lesion.  MRI 2021 showed no new lesion  She was allergic to Ocrevus  and had multiple skin cancers (basal and squamous) while on Gilenya  (still has had some on other medications).   She has no recent skin lesions.     She was never on Lemtrada.   Symptoms started after he Covid booster.  Gait is mildly of balanced she feels due to reduced depth perception more than MS.  No falls.  No weakness.  She has s sensation like socks rolled up under her toes/distal foot.    She has urinary incontinence that is stable but not controlled..   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    Not on an medication for bladder or bowel now.   She has extreme urgency with both and can't always get to a facility in time.   She has nocturia 3-4 times most nights.  She wears pads.    Desmopressin  did not help and she stopped.  She has diplopia (right eye has mild exotropia) and she has prism  glasses.  They help but since diplopia is distance only.  She has Grave's disease (has Thyrotropin receptor Abs).  Her right eye vision is progressively worsenig.   She has diplopia.  She is on methimazole .  She also did Tepeza (had 8 infusion over 2 years) with benefit - initially less diplopia.   12/2022 she had orbital decompression bilaterally due to concern about her optic nerves.    She has had thyroidectomy. Vision is still impaired.     She sees ophthalmology (Dr. Odella and also  Hwong at [facility])  and endocrinology regularly (Now Dr. Jamie; was Dr. Von)  She ihas some mental fog.   Vyvanse  helps ADD.   Due to naitonal shortage she has not been on any.   She is working part-time 16-20 hours only.  She has some fatigue.  She is sleeping better but has nocturia 2-3 rimes nightly..  She has depression but feels fluoxetine  has been helpful.  She is frustrated as can't drive, read or do many tasks due to poor vision.    MS history: She was diagnosed with relapsing remitting MS around 2000 after presenting with numbness in the hands and feet.  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 She was admitted to the hospital. She then was diagnosed with MS and is to see Dr. Juliane at [facility]. He started her on Betaseron.   She did fairly well on Betaseron. She did have several small exacerbations and received some steroids a couple times. Often the exacerbations would be associated with headache as well. About 4 years ago, she opted to switch to Gilenya  to have oral therapy for the MS. She has done very well on that medication without any exacerbations.  Her June 2015 MRI did show one focus that was not present on her MRI from  March 2010.  Due to several skin cancers, she switched to Copaxone  in 2018 but had breakthrough activity.  She switched to Ocrevus  in 2019 but had difficulty tolerating the medication.  She switched to Vumerity  in early 2020.  Switched to DMF in 2023 due to insurance  Recent imaging: MRI orbits 12/22/2022 showed: 1. Symmetric and homogeneous enlargement and enhancement of the extraocular muscles bilaterally compatible with thyroid  orbitopathy. 2. Symmetric bilateral exophthalmos has significantly increased since the most recent study. 3. The muscle enlargement extends into the orbital apex with crowding of the optic nerves bilaterally, left greater than right. 4. Chronic atelectasis of the left maxillary sinus. 5. Mucosal  thickening and secretions within an opacified left sphenoid sinus. 6. Periventricular T2 hyperintensities about the atria of the lateral ventricles is similar to the prior exam, consistent with the known diagnosis of multiple sclerosis.  MRI brain 03/02/2019 showed a new lesion involving the right superior temporal gyrus consistent with progression of disease.    Increased prominence of lesions in the subcortical white matter of the cingulate gyrus bilaterally, right greater than left. This could impact symptoms in the feet or lower extremities and is also consistent with progression of disease.  Progressive signal change in the left sylvian fissure. No enhancement or restricted diffusion to suggest active demyelination.  MRI of the cervical spine 02/24/2004 showed a normal spinal cord and a disc protrusion to the right at C5-C6 causing right C6 nerve root encroachment.  MRI of the thoracic spine 08/16/2005 showed disc protrusion at T9-T10 and milder protrusions elsewhere but no evidence of demyelination  ALLERGIES: Allergies  Allergen Reactions   Methylprednisolone  Sodium Succ Hives, Itching and Palpitations    Wheezing (was getting with Ocrevus  as well)  Wheezing (was getting with Ocrevus  as well)    Other Reaction(s): tachycardia  Wheezing (was getting with Ocrevus  as well)   Methylprednisolone  Hives    Other Reaction(s): tachycardia   Cephalexin Hives and Itching   Cephalexin Hives, Itching and Rash   Ocrelizumab  Other (See Comments) and Palpitations    (Ocrevus ) Wheezing.  (Ocrevus ) Wheezing.    Other Reaction(s): Other (See Comments)  (Ocrevus ) Wheezing.    HOME MEDICATIONS:  Current Outpatient Medications:    diclofenac  (VOLTAREN ) 75 MG EC tablet, Take by mouth as needed., Disp: , Rfl:    Dimethyl Fumarate  240 MG CPDR, Take 1 capsule (240 mg total) by mouth 2 (two) times daily. (Patient taking differently: Take 240 mg by mouth daily.), Disp: 180 capsule, Rfl: 3    FLUoxetine  (PROZAC ) 20 MG capsule, TAKE 3 CAPSULES BY MOUTH EVERY DAY, Disp: 270 capsule, Rfl: 3   levothyroxine  (UNITHROID ) 175 MCG tablet, Take 1 tablet (175 mcg total) by mouth daily before breakfast., Disp: 90 tablet, Rfl: 3   LORazepam  (ATIVAN ) 0.5 MG tablet, Take 1 tablet (0.5 mg total) by mouth as needed for anxiety., Disp: 15 tablet, Rfl: 2   pantoprazole  (PROTONIX ) 40 MG tablet, Take 1 tablet (40 mg total) by mouth daily., Disp: 90 tablet, Rfl: 3  PAST MEDICAL HISTORY: Past Medical History:  Diagnosis Date   ADD (attention deficit disorder with hyperactivity)    Anxiety    Arthritis    Cancer (HCC)    Skin cancer- basal cell, squamous cell   Cervical dysplasia    Family history of adverse reaction to anesthesia    mother due to mitral valve regergitation   GERD (gastroesophageal reflux disease)    Graves disease    Headache    migraine  Hepatitis    Pt states Core and surface antibioties for Hepatitis whens he was 18   Hyperthyroidism    Mitral valve prolapse    Multiple sclerosis    Multiple sclerosis    Pneumonia    Tendonitis in rigtht wrist    Thyroid  eye disease     PAST SURGICAL HISTORY: Past Surgical History:  Procedure Laterality Date   ABDOMINAL HYSTERECTOMY  2004   ovaries retained   BACK SURGERY     heart ablation     15 yrs ago for a fib by Dr.Klein, everything ok   KNEE ARTHROSCOPY WITH SUBCHONDROPLASTY Left 11/13/2017   Procedure: LEFT KNEE ARTHROSCOPY WITH SUBCHONDROPLASTY, PARTIAL MEDIAL MENISCECTOMY, SYNOVECTOMY;  Surgeon: Jerri Kay HERO, MD;  Location: [facility];  Service: Orthopedics;  Laterality: Left;   THYROIDECTOMY N/A 04/06/2021   Procedure: TOTAL THYROIDECTOMY;  Surgeon: Eletha Boas, MD;  Location: [facility];  Service: General;  Laterality: N/A;   TUBAL LIGATION      FAMILY HISTORY: Family History  Problem Relation Age of Onset   Depression Mother    Stroke Mother    Heart disease Mother    Dementia Mother     Hypothyroidism Mother    Stroke Father    Heart disease Father    Healthy Brother    Parkinson's disease Maternal Grandmother    Parkinson's disease Maternal Grandfather    Heart disease Paternal Grandmother    COPD Paternal Grandfather    Cancer Neg Hx    Diabetes Neg Hx    Breast cancer Neg Hx    Multiple sclerosis Neg Hx     SOCIAL HISTORY:  Social History   Socioeconomic History   Marital status: Divorced    Spouse name: Not on file   Number of children: Not on file   Years of education: Not on file   Highest education level: Not on file  Occupational History   Not on file  Tobacco Use   Smoking status: Every Day    Current packs/day: 0.25    Types: Cigarettes   Smokeless tobacco: Never  Vaping Use   Vaping status: Never Used  Substance and Sexual Activity   Alcohol use: Yes    Alcohol/week: 0.0 standard drinks of alcohol    Comment: Occasioanl glass of wine    Drug use: No   Sexual activity: Not Currently    Birth control/protection: Surgical  Other Topics Concern   Not on file  Social History Narrative   Left Handed   2-3 Cups of Coffee per Day   Social Drivers of Health   Financial Resource Strain: Not on file  Food Insecurity: Not on file  Transportation Needs: Not on file  Physical Activity: Not on file  Stress: Not on file  Social Connections: Not on file  Intimate Partner Violence: Not on file     PHYSICAL EXAM   BP 123/72   Pulse 82   Resp 14   Ht 5' 7.5 (1.715 m)   SpO2 93%   BMI 28.70 kg/m    General: The patient is well-developed and well-nourished and in no acute distress   Neurologic Exam  Mental status: The patient is alert and oriented x 3 at the time of the examination. The patient has apparent normal recent and remote memory, with an apparently normal attention span and concentration ability.   Speech is normal.  Cranial nerves: There is mild exophthalmos.  Mild disconjugate gaze looking to the right more than the  left.   Color vision was symmetric.  Facial strength is normal.  Trapezius strength is normal.  Hearing is normal.  Motor:  Muscle bulk is normal.  Muscle tone is increased in the legs.  She has reduced strength in the right leg relative to the left  Sensory: Sensory testing is intact to touch and vibration .    Coordination: Cerebellar testing reveals good finger-nose-finger and Heel-to-shin   Gait and station: Station is normal.  Mild left foot drop.  The tandem gait is wide.  Romberg is negative.  Reflexes: Deep tendon reflexes are symmetric and normal bilaterally.     DIAGNOSTIC DATA (LABS, IMAGING, TESTING) - I reviewed patient records, labs, notes, testing and imaging myself where available.  Lab Results  Component Value Date   WBC 15.7 (H) 03/05/2023   HGB 12.5 03/05/2023   HCT 39.7 03/05/2023   MCV 91 03/05/2023   PLT 345 03/05/2023      Component Value Date/Time   NA 140 03/05/2023 1152   K 4.2 03/05/2023 1152   CL 102 03/05/2023 1152   CO2 21 03/05/2023 1152   GLUCOSE 106 (H) 03/05/2023 1152   GLUCOSE 119 (H) 04/07/2021 0401   BUN 17 03/05/2023 1152   CREATININE 0.66 03/05/2023 1152   CALCIUM  9.6 03/05/2023 1152   PROT 6.8 03/05/2023 1152   ALBUMIN 4.3 03/05/2023 1152   AST 23 03/05/2023 1152   ALT 28 03/05/2023 1152   ALKPHOS 105 03/05/2023 1152   BILITOT 0.3 03/05/2023 1152   GFRNONAA >60 04/07/2021 0401   GFRAA 136 02/03/2020 0921       ASSESSMENT AND PLAN  Multiple sclerosis, relapsing-remitting - Plan: CBC with Differential/Platelet  High risk medication use - Plan: CBC with Differential/Platelet  Graves disease  Thyroid  eye disease  Vitamin D  deficiency  Other fatigue  Urinary incontinence, unspecified type   1.   Continue DMF through [email or web address].    CBC/Diff today. 2.   Continue to f/u with urology for incontinence.   3.   She sees endo and ophtho for her thyroid  and eye disease.    She has proptosis and a very mild right exotropia  with diplopia on distance gaze.  Imaging studies last year showed EOM hypertrophy 4.   She will return to see us  in 6 months or sooner if any new or worsening neurologic symptoms.   Antonela Freiman A. Vear, MD, PhD, DIEDRA 04/27/2024, 1:22 PM Certified in Neurology, Clinical Neurophysiology, Sleep Medicine, Pain Medicine and Neuroimaging  [facility] [address], [address] [place], [place] [address] [phone]

## 2024-04-28 ENCOUNTER — Ambulatory Visit: Payer: Self-pay | Admitting: Neurology

## 2024-04-28 LAB — CBC WITH DIFFERENTIAL/PLATELET
Basophils Absolute: 0.1 x10E3/uL (ref 0.0–0.2)
Basos: 1 %
EOS (ABSOLUTE): 0.2 x10E3/uL (ref 0.0–0.4)
Eos: 2 %
Hematocrit: 40.2 % (ref 34.0–46.6)
Hemoglobin: 12.6 g/dL (ref 11.1–15.9)
Immature Grans (Abs): 0 x10E3/uL (ref 0.0–0.1)
Immature Granulocytes: 0 %
Lymphocytes Absolute: 3.4 x10E3/uL — ABNORMAL HIGH (ref 0.7–3.1)
Lymphs: 31 %
MCH: 28.2 pg (ref 26.6–33.0)
MCHC: 31.3 g/dL — ABNORMAL LOW (ref 31.5–35.7)
MCV: 90 fL (ref 79–97)
Monocytes Absolute: 0.7 x10E3/uL (ref 0.1–0.9)
Monocytes: 7 %
Neutrophils Absolute: 6.5 x10E3/uL (ref 1.4–7.0)
Neutrophils: 59 %
Platelets: 324 x10E3/uL (ref 150–450)
RBC: 4.47 x10E6/uL (ref 3.77–5.28)
RDW: 13.6 % (ref 11.7–15.4)
WBC: 11 x10E3/uL — ABNORMAL HIGH (ref 3.4–10.8)

## 2024-06-19 ENCOUNTER — Telehealth (HOSPITAL_COMMUNITY): Admitting: Psychiatry

## 2024-06-19 ENCOUNTER — Encounter (HOSPITAL_COMMUNITY): Payer: Self-pay | Admitting: Psychiatry

## 2024-06-19 VITALS — Wt 186.0 lb

## 2024-06-19 DIAGNOSIS — F330 Major depressive disorder, recurrent, mild: Secondary | ICD-10-CM | POA: Diagnosis not present

## 2024-06-19 DIAGNOSIS — F419 Anxiety disorder, unspecified: Secondary | ICD-10-CM

## 2024-06-19 MED ORDER — LORAZEPAM 0.5 MG PO TABS: 0.5000 mg | ORAL_TABLET | ORAL | 3 refills | Status: AC | PRN

## 2024-06-19 NOTE — Progress Notes (Signed)
 " [name] MD Virtual Progress Note   Patient Location: Home Provider Location: Home Office  I connect with patient by telephone and verified that I am speaking with correct person by using two identifiers. I discussed the limitations of evaluation and management by telemedicine and the availability of in person appointments. I also discussed with the patient that there may be a patient responsible charge related to this service. The patient expressed understanding and agreed to proceed.  [name] [id number] 64 y.o.  06/19/2024 9:27 AM  History of Present Illness:  Patient is evaluated by phone session.  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  She reported disappointed because the last hearing did not had any decision and now there is a next hearing in March.  She admitted some frustration and anxiety with the process.  She had denied twice her disability and this time her attorney was very hopeful but again disappointment.  She had a good Christmas because both of her son were in the town and she enjoyed the family time.  She has taken few times Ativan  to calm herself especially after the news of rescheduling hearing dates.  She reported her MS is under control.  She is taking Prozac  which is helping her depression and prescribed by neurologist.  She has no tremor or shakes or any EPS.  Her appetite is okay.  She admitted sometimes struggle with sleep but does not feel it is due to anxiety or depression.  She denies any hopelessness, worthlessness, suicidal thoughts.  We have provided 15 tablets of Ativan  which usually last more than a month.  She denies drinking or using any illegal substances.  Her appetite is okay and her weight is unchanged from the past.  Past Psychiatric History: H/O depression and ADD.  Tried Zoloft, Focalin, Ritalin  and Strattera.  No history of inpatient or any suicidal attempt.   Past Medical  History:  Diagnosis Date   ADD (attention deficit disorder with hyperactivity)    Anxiety    Arthritis    Cancer (HCC)    Skin cancer- basal cell, squamous cell   Cervical dysplasia    Family history of adverse reaction to anesthesia    mother due to mitral valve regergitation   GERD (gastroesophageal reflux disease)    Graves disease    Headache    migraine   Hepatitis    Pt states Core and surface antibioties for Hepatitis whens he was 18   Hyperthyroidism    Mitral valve prolapse    Multiple sclerosis    Multiple sclerosis    Pneumonia    Tendonitis in rigtht wrist    Thyroid  eye disease     Outpatient Encounter Medications as of 06/19/2024  Medication Sig   diclofenac  (VOLTAREN ) 75 MG EC tablet Take by mouth as needed.   Dimethyl Fumarate  240 MG CPDR Take 1 capsule (240 mg total) by mouth 2 (two) times daily. (Patient taking differently: Take 240 mg by mouth daily.)   FLUoxetine  (PROZAC ) 20 MG capsule TAKE 3 CAPSULES BY MOUTH EVERY DAY   levothyroxine  (UNITHROID ) 175 MCG tablet Take 1 tablet (175 mcg total) by mouth daily before breakfast.   LORazepam  (ATIVAN ) 0.5 MG tablet Take 1 tablet (0.5 mg total) by mouth as needed for anxiety.   pantoprazole  (PROTONIX ) 40 MG tablet Take 1 tablet (40 mg total) by mouth daily.   [DISCONTINUED] atenolol  (TENORMIN ) 50 MG tablet Take 1 tablet (  50 mg total) by mouth daily. (Patient not taking: No sig reported)   No facility-administered encounter medications on file as of 06/19/2024.    Recent Results (from the past 2160 hours)  CBC with Differential/Platelet     Status: Abnormal   Collection Time: 04/27/24 12:16 PM  Result Value Ref Range   WBC 11.0 (H) 3.4 - 10.8 x10E3/uL   RBC 4.47 3.77 - 5.28 x10E6/uL   Hemoglobin 12.6 11.1 - 15.9 g/dL   Hematocrit 59.7 65.9 - 46.6 %   MCV 90 79 - 97 fL   MCH 28.2 26.6 - 33.0 pg   MCHC 31.3 (L) 31.5 - 35.7 g/dL   RDW 86.3 88.2 - 84.5 %   Platelets 324 150 - 450 x10E3/uL   Neutrophils 59 Not  Estab. %   Lymphs 31 Not Estab. %   Monocytes 7 Not Estab. %   Eos 2 Not Estab. %   Basos 1 Not Estab. %   Neutrophils Absolute 6.5 1.4 - 7.0 x10E3/uL   Lymphocytes Absolute 3.4 (H) 0.7 - 3.1 x10E3/uL   Monocytes Absolute 0.7 0.1 - 0.9 x10E3/uL   EOS (ABSOLUTE) 0.2 0.0 - 0.4 x10E3/uL   Basophils Absolute 0.1 0.0 - 0.2 x10E3/uL   Immature Granulocytes 0 Not Estab. %   Immature Grans (Abs) 0.0 0.0 - 0.1 x10E3/uL     Psychiatric Specialty Exam: Physical Exam  Review of Systems  Eyes:  Positive for visual disturbance.    Weight 186 lb (84.4 kg).There is no height or weight on file to calculate BMI.  General Appearance: NA  Eye Contact:  NA  Speech:  Normal Rate  Volume:  Normal  Mood:  Anxious  Affect:  NA  Thought Process:  Goal Directed  Orientation:  Full (Time, Place, and Person)  Thought Content:  Rumination  Suicidal Thoughts:  No  Homicidal Thoughts:  No  Memory:  Immediate;   Good Recent;   Good Remote;   Good  Judgement:  Intact  Insight:  Present  Psychomotor Activity:  NA  Concentration:  Concentration: Good and Attention Span: Good  Recall:  Good  Fund of Knowledge:  Good  Language:  Good  Akathisia:  No  Handed:  Right  AIMS (if indicated):     Assets:  Communication Skills Desire for Improvement Housing Social Support  ADL's:  Intact  Cognition:  WNL  Sleep:  fair       12/15/2020    3:18 PM 03/13/2016    2:33 PM  Depression screen PHQ 2/9  Decreased Interest 0 0  Down, Depressed, Hopeless 0 0  PHQ - 2 Score 0 0  Altered sleeping 0   Tired, decreased energy 0   Change in appetite 0   Feeling bad or failure about yourself  0   Trouble concentrating 0   Moving slowly or fidgety/restless 0   Suicidal thoughts 0   PHQ-9 Score 0       Data saved with a previous flowsheet row definition    Assessment/Plan: MDD (major depressive disorder), recurrent episode, mild - Plan: LORazepam  (ATIVAN ) 0.5 MG tablet  Anxiety - Plan: LORazepam  (ATIVAN )  0.5 MG tablet  Patient is 64 year old female with major depressive disorder, anxiety, multiple sclerosis.  She is disappointed because disability did not approved and she has another court hearing in March.  Reassurance given.  She is working with her clinical research associate.  She is taking Ativan  as needed.  Her Prozac  is given by her neurologist.  She does not  want to change the medication.  Discussed benzodiazepine dependence tolerance withdrawal.  Recommend to call back if she is any question or any concern.  Continue Ativan  0.5 mg as needed for severe anxiety and she is keeping Prozac  60 mg from neurology to help with depression.  We agreed to have a follow-up in 6 months unless patient need a sooner appointment.  Follow Up Instructions:     I discussed the assessment and treatment plan with the patient. The patient was provided an opportunity to ask questions and all were answered. The patient agreed with the plan and demonstrated an understanding of the instructions.   The patient was advised to call back or seek an in-person evaluation if the symptoms worsen or if the condition fails to improve as anticipated.    Collaboration of Care: Other provider involved in patient's care AEB notes are available in epic to review.  Patient/Guardian was advised Release of Information must be obtained prior to any record release in order to collaborate their care with an outside provider. Patient/Guardian was advised if they have not already done so to contact the registration department to sign all necessary forms in order for us  to release information regarding their care.   Consent: Patient/Guardian gives verbal consent for treatment and assignment of benefits for services provided during this visit. Patient/Guardian expressed understanding and agreed to proceed.     Total encounter time 17 minutes which includes face-to-face time, chart reviewed, care coordination, order entry and documentation during this  encounter.   Note: This document was prepared by [employer] voice dictation technology and any errors that results from this process are unintentional.    [name] Client, MD 06/19/2024   "

## 2024-06-26 ENCOUNTER — Telehealth (HOSPITAL_COMMUNITY): Admitting: Psychiatry

## 2024-07-13 ENCOUNTER — Other Ambulatory Visit

## 2024-07-16 ENCOUNTER — Ambulatory Visit: Admitting: Endocrinology

## 2024-11-25 ENCOUNTER — Ambulatory Visit: Admitting: Neurology

## 2024-12-18 ENCOUNTER — Telehealth (HOSPITAL_COMMUNITY): Admitting: Psychiatry
